# Patient Record
Sex: Female | Born: 1935 | Race: White | Hispanic: No | Smoking: Never smoker
Health system: Southern US, Community
[De-identification: ages and names within clinical notes are randomized; demographics above are authoritative.]

## PROBLEM LIST (undated history)

## (undated) DIAGNOSIS — Z86010 Personal history of colonic polyps: Secondary | ICD-10-CM

## (undated) DIAGNOSIS — E079 Disorder of thyroid, unspecified: Secondary | ICD-10-CM

## (undated) DIAGNOSIS — K5792 Diverticulitis of intestine, part unspecified, without perforation or abscess without bleeding: Secondary | ICD-10-CM

## (undated) DIAGNOSIS — I4891 Unspecified atrial fibrillation: Secondary | ICD-10-CM

## (undated) DIAGNOSIS — E785 Hyperlipidemia, unspecified: Secondary | ICD-10-CM

## (undated) DIAGNOSIS — N289 Disorder of kidney and ureter, unspecified: Secondary | ICD-10-CM

## (undated) DIAGNOSIS — A0472 Enterocolitis due to Clostridium difficile, not specified as recurrent: Secondary | ICD-10-CM

## (undated) DIAGNOSIS — K5790 Diverticulosis of intestine, part unspecified, without perforation or abscess without bleeding: Secondary | ICD-10-CM

## (undated) DIAGNOSIS — H269 Unspecified cataract: Secondary | ICD-10-CM

## (undated) DIAGNOSIS — I509 Heart failure, unspecified: Secondary | ICD-10-CM

## (undated) HISTORY — DX: Unspecified atrial fibrillation: I48.91

## (undated) HISTORY — DX: Unspecified cataract: H26.9

## (undated) HISTORY — DX: Disorder of kidney and ureter, unspecified: N28.9

## (undated) HISTORY — PX: CATARACT EXTRACTION, BILATERAL: SHX1313

## (undated) HISTORY — PX: CARDIAC DEFIBRILLATOR PLACEMENT: SHX171

## (undated) HISTORY — DX: Enterocolitis due to Clostridium difficile, not specified as recurrent: A04.72

## (undated) HISTORY — PX: PACEMAKER PLACEMENT: SHX43

## (undated) HISTORY — DX: Disorder of thyroid, unspecified: E07.9

## (undated) HISTORY — PX: OTHER SURGICAL HISTORY: SHX169

## (undated) HISTORY — DX: Diverticulitis of intestine, part unspecified, without perforation or abscess without bleeding: K57.92

## (undated) HISTORY — DX: Personal history of colonic polyps: Z86.010

## (undated) HISTORY — DX: Gilbert syndrome: E80.4

## (undated) HISTORY — DX: Hyperlipidemia, unspecified: E78.5

## (undated) HISTORY — PX: ROTATOR CUFF REPAIR: SHX139

## (undated) HISTORY — DX: Diverticulosis of intestine, part unspecified, without perforation or abscess without bleeding: K57.90

## (undated) HISTORY — PX: BIOPSY THYROID: PRO38

---
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
## 1970-02-17 HISTORY — PX: CHOLECYSTECTOMY: SHX55

## 1971-02-18 HISTORY — PX: ABDOMINAL HYSTERECTOMY: SHX81

## 1995-02-18 HISTORY — PX: BREAST BIOPSY: SHX20

## 1995-06-23 ENCOUNTER — Encounter: Payer: Self-pay | Admitting: Gastroenterology

## 1997-07-11 ENCOUNTER — Ambulatory Visit (HOSPITAL_COMMUNITY): Admission: RE | Admit: 1997-07-11 | Discharge: 1997-07-11 | Payer: Self-pay | Admitting: Surgery

## 1997-08-22 ENCOUNTER — Other Ambulatory Visit: Admission: RE | Admit: 1997-08-22 | Discharge: 1997-08-22 | Payer: Self-pay | Admitting: Gynecology

## 1997-08-30 ENCOUNTER — Ambulatory Visit (HOSPITAL_BASED_OUTPATIENT_CLINIC_OR_DEPARTMENT_OTHER): Admission: RE | Admit: 1997-08-30 | Discharge: 1997-08-30 | Payer: Self-pay | Admitting: Surgery

## 1998-01-10 ENCOUNTER — Ambulatory Visit: Admission: RE | Admit: 1998-01-10 | Discharge: 1998-01-10 | Payer: Self-pay | Admitting: Internal Medicine

## 1998-04-23 ENCOUNTER — Ambulatory Visit (HOSPITAL_BASED_OUTPATIENT_CLINIC_OR_DEPARTMENT_OTHER): Admission: RE | Admit: 1998-04-23 | Discharge: 1998-04-23 | Payer: Self-pay | Admitting: Orthopedic Surgery

## 1998-05-01 ENCOUNTER — Encounter: Admission: RE | Admit: 1998-05-01 | Discharge: 1998-07-30 | Payer: Self-pay | Admitting: Orthopedic Surgery

## 1998-10-18 ENCOUNTER — Other Ambulatory Visit: Admission: RE | Admit: 1998-10-18 | Discharge: 1998-10-18 | Payer: Self-pay | Admitting: Gynecology

## 1999-02-21 ENCOUNTER — Encounter: Payer: Self-pay | Admitting: Sports Medicine

## 1999-02-21 ENCOUNTER — Encounter: Admission: RE | Admit: 1999-02-21 | Discharge: 1999-02-21 | Payer: Self-pay | Admitting: Sports Medicine

## 1999-03-11 ENCOUNTER — Ambulatory Visit (HOSPITAL_BASED_OUTPATIENT_CLINIC_OR_DEPARTMENT_OTHER): Admission: RE | Admit: 1999-03-11 | Discharge: 1999-03-11 | Payer: Self-pay | Admitting: Orthopedic Surgery

## 1999-03-14 ENCOUNTER — Encounter: Admission: RE | Admit: 1999-03-14 | Discharge: 1999-06-12 | Payer: Self-pay | Admitting: Neurology

## 1999-09-10 ENCOUNTER — Encounter: Admission: RE | Admit: 1999-09-10 | Discharge: 1999-09-10 | Payer: Self-pay | Admitting: Gynecology

## 1999-09-10 ENCOUNTER — Encounter: Payer: Self-pay | Admitting: Gynecology

## 2000-06-22 ENCOUNTER — Ambulatory Visit (HOSPITAL_BASED_OUTPATIENT_CLINIC_OR_DEPARTMENT_OTHER): Admission: RE | Admit: 2000-06-22 | Discharge: 2000-06-22 | Payer: Self-pay | Admitting: Orthopedic Surgery

## 2000-08-30 ENCOUNTER — Inpatient Hospital Stay (HOSPITAL_COMMUNITY): Admission: EM | Admit: 2000-08-30 | Discharge: 2000-08-31 | Payer: Self-pay

## 2000-09-30 ENCOUNTER — Encounter: Payer: Self-pay | Admitting: Gynecology

## 2000-09-30 ENCOUNTER — Encounter: Admission: RE | Admit: 2000-09-30 | Discharge: 2000-09-30 | Payer: Self-pay | Admitting: Gynecology

## 2000-10-02 ENCOUNTER — Encounter: Payer: Self-pay | Admitting: Gastroenterology

## 2000-10-09 ENCOUNTER — Ambulatory Visit (HOSPITAL_COMMUNITY): Admission: RE | Admit: 2000-10-09 | Discharge: 2000-10-09 | Payer: Self-pay | Admitting: Internal Medicine

## 2000-10-09 ENCOUNTER — Encounter: Payer: Self-pay | Admitting: Internal Medicine

## 2000-10-21 ENCOUNTER — Other Ambulatory Visit: Admission: RE | Admit: 2000-10-21 | Discharge: 2000-10-21 | Payer: Self-pay | Admitting: Gynecology

## 2001-04-20 ENCOUNTER — Other Ambulatory Visit: Admission: RE | Admit: 2001-04-20 | Discharge: 2001-04-20 | Payer: Self-pay | Admitting: *Deleted

## 2001-04-24 ENCOUNTER — Emergency Department (HOSPITAL_COMMUNITY): Admission: EM | Admit: 2001-04-24 | Discharge: 2001-04-24 | Payer: Self-pay | Admitting: *Deleted

## 2001-10-01 ENCOUNTER — Encounter: Admission: RE | Admit: 2001-10-01 | Discharge: 2001-10-01 | Payer: Self-pay | Admitting: Obstetrics and Gynecology

## 2001-10-01 ENCOUNTER — Encounter: Payer: Self-pay | Admitting: Obstetrics and Gynecology

## 2001-10-06 ENCOUNTER — Other Ambulatory Visit: Admission: RE | Admit: 2001-10-06 | Discharge: 2001-10-06 | Payer: Self-pay | Admitting: Obstetrics and Gynecology

## 2002-10-25 ENCOUNTER — Other Ambulatory Visit: Admission: RE | Admit: 2002-10-25 | Discharge: 2002-10-25 | Payer: Self-pay | Admitting: Obstetrics and Gynecology

## 2002-12-18 ENCOUNTER — Emergency Department (HOSPITAL_COMMUNITY): Admission: EM | Admit: 2002-12-18 | Discharge: 2002-12-18 | Payer: Self-pay | Admitting: Emergency Medicine

## 2003-09-26 ENCOUNTER — Encounter: Admission: RE | Admit: 2003-09-26 | Discharge: 2003-09-26 | Payer: Self-pay | Admitting: Sports Medicine

## 2003-10-12 ENCOUNTER — Encounter: Payer: Self-pay | Admitting: Internal Medicine

## 2003-10-15 ENCOUNTER — Inpatient Hospital Stay (HOSPITAL_COMMUNITY): Admission: EM | Admit: 2003-10-15 | Discharge: 2003-10-16 | Payer: Self-pay | Admitting: Emergency Medicine

## 2003-10-16 ENCOUNTER — Encounter: Payer: Self-pay | Admitting: Cardiology

## 2003-11-13 ENCOUNTER — Other Ambulatory Visit: Admission: RE | Admit: 2003-11-13 | Discharge: 2003-11-13 | Payer: Self-pay | Admitting: Obstetrics and Gynecology

## 2003-12-28 ENCOUNTER — Ambulatory Visit: Payer: Self-pay | Admitting: Cardiology

## 2004-01-09 ENCOUNTER — Ambulatory Visit: Payer: Self-pay | Admitting: Internal Medicine

## 2004-01-23 ENCOUNTER — Ambulatory Visit: Payer: Self-pay | Admitting: Cardiology

## 2004-02-15 ENCOUNTER — Ambulatory Visit: Payer: Self-pay | Admitting: *Deleted

## 2004-02-29 ENCOUNTER — Ambulatory Visit: Payer: Self-pay | Admitting: Internal Medicine

## 2004-03-14 ENCOUNTER — Ambulatory Visit: Payer: Self-pay | Admitting: Cardiology

## 2004-04-01 ENCOUNTER — Ambulatory Visit: Payer: Self-pay | Admitting: Cardiology

## 2004-04-09 ENCOUNTER — Ambulatory Visit: Payer: Self-pay | Admitting: Internal Medicine

## 2004-04-22 ENCOUNTER — Ambulatory Visit: Payer: Self-pay | Admitting: Cardiology

## 2004-05-20 ENCOUNTER — Ambulatory Visit: Payer: Self-pay | Admitting: Cardiology

## 2004-06-17 ENCOUNTER — Ambulatory Visit: Payer: Self-pay | Admitting: Cardiology

## 2004-06-27 ENCOUNTER — Ambulatory Visit: Payer: Self-pay | Admitting: Internal Medicine

## 2004-07-18 ENCOUNTER — Ambulatory Visit: Payer: Self-pay | Admitting: Internal Medicine

## 2004-08-15 ENCOUNTER — Ambulatory Visit: Payer: Self-pay | Admitting: Internal Medicine

## 2004-08-27 ENCOUNTER — Ambulatory Visit: Payer: Self-pay | Admitting: *Deleted

## 2004-10-11 ENCOUNTER — Ambulatory Visit: Payer: Self-pay | Admitting: Internal Medicine

## 2004-10-15 ENCOUNTER — Ambulatory Visit: Payer: Self-pay | Admitting: Internal Medicine

## 2004-10-16 ENCOUNTER — Other Ambulatory Visit: Admission: RE | Admit: 2004-10-16 | Discharge: 2004-10-16 | Payer: Self-pay | Admitting: Gynecology

## 2004-11-07 ENCOUNTER — Ambulatory Visit: Payer: Self-pay | Admitting: Cardiology

## 2004-11-20 ENCOUNTER — Ambulatory Visit: Payer: Self-pay | Admitting: Cardiology

## 2004-11-28 ENCOUNTER — Ambulatory Visit: Payer: Self-pay | Admitting: Internal Medicine

## 2004-12-11 ENCOUNTER — Ambulatory Visit: Payer: Self-pay | Admitting: Cardiology

## 2004-12-19 ENCOUNTER — Ambulatory Visit: Payer: Self-pay | Admitting: Cardiology

## 2005-01-02 ENCOUNTER — Ambulatory Visit: Payer: Self-pay | Admitting: Internal Medicine

## 2005-01-24 ENCOUNTER — Ambulatory Visit: Payer: Self-pay | Admitting: Cardiology

## 2005-01-24 ENCOUNTER — Ambulatory Visit: Payer: Self-pay | Admitting: Internal Medicine

## 2005-02-21 ENCOUNTER — Ambulatory Visit: Payer: Self-pay | Admitting: Cardiology

## 2005-03-21 ENCOUNTER — Ambulatory Visit: Payer: Self-pay | Admitting: Cardiology

## 2005-03-25 ENCOUNTER — Ambulatory Visit: Payer: Self-pay | Admitting: Internal Medicine

## 2005-04-04 ENCOUNTER — Ambulatory Visit: Payer: Self-pay | Admitting: Cardiovascular Disease

## 2005-04-24 ENCOUNTER — Ambulatory Visit: Payer: Self-pay | Admitting: Cardiology

## 2005-05-22 ENCOUNTER — Ambulatory Visit: Payer: Self-pay | Admitting: Cardiology

## 2005-06-19 ENCOUNTER — Ambulatory Visit: Payer: Self-pay | Admitting: Cardiology

## 2005-07-17 ENCOUNTER — Ambulatory Visit: Payer: Self-pay | Admitting: Cardiology

## 2005-08-07 ENCOUNTER — Ambulatory Visit: Payer: Self-pay | Admitting: Cardiology

## 2005-08-27 ENCOUNTER — Ambulatory Visit: Payer: Self-pay | Admitting: Cardiovascular Disease

## 2005-09-17 ENCOUNTER — Ambulatory Visit: Payer: Self-pay | Admitting: Internal Medicine

## 2005-09-23 ENCOUNTER — Ambulatory Visit: Payer: Self-pay

## 2005-09-23 ENCOUNTER — Ambulatory Visit: Payer: Self-pay | Admitting: Cardiology

## 2005-09-23 ENCOUNTER — Encounter: Payer: Self-pay | Admitting: Cardiology

## 2005-09-25 ENCOUNTER — Ambulatory Visit: Payer: Self-pay | Admitting: Internal Medicine

## 2005-10-06 ENCOUNTER — Ambulatory Visit: Payer: Self-pay | Admitting: Internal Medicine

## 2005-10-08 ENCOUNTER — Encounter: Admission: RE | Admit: 2005-10-08 | Discharge: 2005-10-08 | Payer: Self-pay | Admitting: Gynecology

## 2005-10-09 ENCOUNTER — Ambulatory Visit: Payer: Self-pay | Admitting: Internal Medicine

## 2005-10-14 ENCOUNTER — Encounter: Admission: RE | Admit: 2005-10-14 | Discharge: 2005-10-14 | Payer: Self-pay | Admitting: Gynecology

## 2005-10-16 ENCOUNTER — Ambulatory Visit: Payer: Self-pay | Admitting: Internal Medicine

## 2005-10-22 ENCOUNTER — Ambulatory Visit: Payer: Self-pay | Admitting: *Deleted

## 2005-11-11 ENCOUNTER — Ambulatory Visit: Payer: Self-pay | Admitting: Cardiovascular Disease

## 2005-12-04 ENCOUNTER — Ambulatory Visit: Payer: Self-pay | Admitting: Internal Medicine

## 2005-12-11 ENCOUNTER — Ambulatory Visit: Payer: Self-pay | Admitting: Cardiology

## 2005-12-16 ENCOUNTER — Ambulatory Visit: Payer: Self-pay | Admitting: Internal Medicine

## 2005-12-16 LAB — CONVERTED CEMR LAB
Chol/HDL Ratio, serum: 3.7
Cholesterol: 130 mg/dL (ref 0–200)
HDL: 35.1 mg/dL — ABNORMAL LOW (ref >39.0)
LDL Cholesterol: 75 mg/dL (ref 0–99)
Triglyceride fasting, serum: 100 mg/dL (ref 0–149)

## 2005-12-24 ENCOUNTER — Ambulatory Visit: Payer: Self-pay | Admitting: Internal Medicine

## 2006-01-01 ENCOUNTER — Ambulatory Visit: Payer: Self-pay | Admitting: Internal Medicine

## 2006-01-01 ENCOUNTER — Ambulatory Visit: Payer: Self-pay | Admitting: Cardiology

## 2006-01-15 ENCOUNTER — Ambulatory Visit: Payer: Self-pay | Admitting: Internal Medicine

## 2006-02-04 ENCOUNTER — Ambulatory Visit: Payer: Self-pay | Admitting: *Deleted

## 2006-03-04 ENCOUNTER — Ambulatory Visit: Payer: Self-pay | Admitting: Cardiology

## 2006-03-12 ENCOUNTER — Ambulatory Visit: Payer: Self-pay | Admitting: Internal Medicine

## 2006-04-01 ENCOUNTER — Ambulatory Visit: Payer: Self-pay | Admitting: *Deleted

## 2006-04-22 ENCOUNTER — Ambulatory Visit: Payer: Self-pay | Admitting: *Deleted

## 2006-05-05 ENCOUNTER — Ambulatory Visit: Payer: Self-pay | Admitting: Internal Medicine

## 2006-05-20 ENCOUNTER — Ambulatory Visit: Payer: Self-pay | Admitting: Cardiology

## 2006-06-17 ENCOUNTER — Ambulatory Visit: Payer: Self-pay | Admitting: Internal Medicine

## 2006-07-01 ENCOUNTER — Ambulatory Visit: Payer: Self-pay | Admitting: Internal Medicine

## 2006-07-01 LAB — CONVERTED CEMR LAB
HDL: 35.4 mg/dL — ABNORMAL LOW (ref >39.0)
LDL Cholesterol: 68 mg/dL (ref 0–99)
Total CHOL/HDL Ratio: 3.6
Triglycerides: 129 mg/dL (ref 0–149)
VLDL: 26 mg/dL (ref 0–40)

## 2006-07-09 DIAGNOSIS — E041 Nontoxic single thyroid nodule: Secondary | ICD-10-CM | POA: Insufficient documentation

## 2006-07-09 DIAGNOSIS — E890 Postprocedural hypothyroidism: Secondary | ICD-10-CM | POA: Insufficient documentation

## 2006-07-14 ENCOUNTER — Encounter: Payer: Self-pay | Admitting: Internal Medicine

## 2006-07-14 ENCOUNTER — Ambulatory Visit: Payer: Self-pay | Admitting: Internal Medicine

## 2006-07-14 DIAGNOSIS — I4891 Unspecified atrial fibrillation: Secondary | ICD-10-CM | POA: Insufficient documentation

## 2006-07-16 ENCOUNTER — Ambulatory Visit: Payer: Self-pay | Admitting: Internal Medicine

## 2006-07-16 ENCOUNTER — Ambulatory Visit: Payer: Self-pay | Admitting: *Deleted

## 2006-07-17 ENCOUNTER — Encounter: Admission: RE | Admit: 2006-07-17 | Discharge: 2006-07-17 | Payer: Self-pay | Admitting: Internal Medicine

## 2006-08-04 ENCOUNTER — Encounter: Payer: Self-pay | Admitting: Internal Medicine

## 2006-08-04 ENCOUNTER — Ambulatory Visit: Payer: Self-pay

## 2006-08-13 ENCOUNTER — Ambulatory Visit: Payer: Self-pay | Admitting: Cardiology

## 2006-08-28 ENCOUNTER — Telehealth (INDEPENDENT_AMBULATORY_CARE_PROVIDER_SITE_OTHER): Payer: Self-pay | Admitting: *Deleted

## 2006-09-07 ENCOUNTER — Telehealth (INDEPENDENT_AMBULATORY_CARE_PROVIDER_SITE_OTHER): Payer: Self-pay | Admitting: *Deleted

## 2006-09-08 ENCOUNTER — Ambulatory Visit: Payer: Self-pay | Admitting: Internal Medicine

## 2006-09-08 DIAGNOSIS — H16229 Keratoconjunctivitis sicca, not specified as Sjogren's, unspecified eye: Secondary | ICD-10-CM | POA: Insufficient documentation

## 2006-09-10 ENCOUNTER — Ambulatory Visit: Payer: Self-pay | Admitting: Cardiology

## 2006-09-29 ENCOUNTER — Ambulatory Visit: Payer: Self-pay | Admitting: Cardiology

## 2006-09-29 ENCOUNTER — Ambulatory Visit: Payer: Self-pay | Admitting: Internal Medicine

## 2006-09-29 LAB — CONVERTED CEMR LAB
Basophils Relative: 0 % (ref 0.0–1.0)
CO2: 27 meq/L (ref 19–32)
Chloride: 104 meq/L (ref 96–112)
Eosinophils Absolute: 0.1 10*3/uL (ref 0.0–0.6)
Eosinophils Relative: 1.7 % (ref 0.0–5.0)
GFR calc Af Amer: 70 mL/min
GFR calc non Af Amer: 57 mL/min
Glucose, Bld: 93 mg/dL (ref 70–99)
HCT: 42.3 % (ref 36.0–46.0)
Lymphocytes Relative: 33.9 % (ref 12.0–46.0)
MCV: 90.5 fL (ref 78.0–100.0)
Neutro Abs: 4.6 10*3/uL (ref 1.4–7.7)
Neutrophils Relative %: 58.1 % (ref 43.0–77.0)
Prothrombin Time: 20.2 s — ABNORMAL HIGH (ref 10.0–14.0)
Sodium: 138 meq/L (ref 135–145)
WBC: 7.9 10*3/uL (ref 4.5–10.5)

## 2006-09-30 ENCOUNTER — Ambulatory Visit: Payer: Self-pay | Admitting: Internal Medicine

## 2006-10-06 ENCOUNTER — Encounter: Payer: Self-pay | Admitting: Internal Medicine

## 2006-10-07 ENCOUNTER — Ambulatory Visit: Payer: Self-pay | Admitting: Cardiology

## 2006-10-07 ENCOUNTER — Ambulatory Visit (HOSPITAL_COMMUNITY): Admission: RE | Admit: 2006-10-07 | Discharge: 2006-10-07 | Payer: Self-pay | Admitting: Internal Medicine

## 2006-10-14 ENCOUNTER — Encounter: Admission: RE | Admit: 2006-10-14 | Discharge: 2006-10-14 | Payer: Self-pay | Admitting: Gynecology

## 2006-10-21 ENCOUNTER — Ambulatory Visit: Payer: Self-pay | Admitting: Internal Medicine

## 2006-10-21 LAB — CONVERTED CEMR LAB
BUN: 17 mg/dL (ref 6–23)
Basophils Relative: 0 % (ref 0.0–1.0)
CO2: 31 meq/L (ref 19–32)
Creatinine, Ser: 1 mg/dL (ref 0.4–1.2)
GFR calc Af Amer: 70 mL/min
Glucose, Bld: 95 mg/dL (ref 70–99)
HCT: 41.2 % (ref 36.0–46.0)
Hemoglobin: 14.5 g/dL (ref 12.0–15.0)
INR: 2.7 — ABNORMAL HIGH (ref 0.8–1.0)
Lymphocytes Relative: 34.1 % (ref 12.0–46.0)
Monocytes Absolute: 0.5 10*3/uL (ref 0.2–0.7)
Monocytes Relative: 7.3 % (ref 3.0–11.0)
Neutro Abs: 4.3 10*3/uL (ref 1.4–7.7)
Neutrophils Relative %: 56.8 % (ref 43.0–77.0)
Potassium: 4.1 meq/L (ref 3.5–5.1)
Prothrombin Time: 20.8 s — ABNORMAL HIGH (ref 10.9–13.3)
RDW: 13.4 % (ref 11.5–14.6)
Sodium: 142 meq/L (ref 135–145)

## 2006-10-22 ENCOUNTER — Ambulatory Visit: Payer: Self-pay | Admitting: Cardiology

## 2006-10-24 ENCOUNTER — Emergency Department (HOSPITAL_COMMUNITY): Admission: EM | Admit: 2006-10-24 | Discharge: 2006-10-24 | Payer: Self-pay | Admitting: Emergency Medicine

## 2006-10-25 ENCOUNTER — Encounter: Payer: Self-pay | Admitting: Internal Medicine

## 2006-10-26 ENCOUNTER — Ambulatory Visit (HOSPITAL_COMMUNITY): Admission: RE | Admit: 2006-10-26 | Discharge: 2006-10-27 | Payer: Self-pay | Admitting: Internal Medicine

## 2006-10-26 ENCOUNTER — Ambulatory Visit: Payer: Self-pay | Admitting: Internal Medicine

## 2006-10-30 ENCOUNTER — Ambulatory Visit: Payer: Self-pay | Admitting: Cardiology

## 2006-10-30 ENCOUNTER — Ambulatory Visit: Payer: Self-pay | Admitting: Internal Medicine

## 2006-11-02 ENCOUNTER — Encounter: Admission: RE | Admit: 2006-11-02 | Discharge: 2006-11-02 | Payer: Self-pay | Admitting: Gynecology

## 2006-11-02 ENCOUNTER — Encounter: Payer: Self-pay | Admitting: Internal Medicine

## 2006-11-03 ENCOUNTER — Telehealth (INDEPENDENT_AMBULATORY_CARE_PROVIDER_SITE_OTHER): Payer: Self-pay | Admitting: *Deleted

## 2006-11-04 ENCOUNTER — Telehealth: Payer: Self-pay | Admitting: Internal Medicine

## 2006-11-04 ENCOUNTER — Ambulatory Visit: Payer: Self-pay | Admitting: Internal Medicine

## 2006-11-05 ENCOUNTER — Ambulatory Visit: Payer: Self-pay | Admitting: Cardiology

## 2006-11-11 ENCOUNTER — Ambulatory Visit: Payer: Self-pay | Admitting: Internal Medicine

## 2006-11-11 ENCOUNTER — Ambulatory Visit: Payer: Self-pay

## 2006-11-13 ENCOUNTER — Encounter: Admission: RE | Admit: 2006-11-13 | Discharge: 2006-11-13 | Payer: Self-pay | Admitting: Internal Medicine

## 2006-11-13 ENCOUNTER — Telehealth: Payer: Self-pay | Admitting: Internal Medicine

## 2006-11-20 ENCOUNTER — Ambulatory Visit: Payer: Self-pay | Admitting: Internal Medicine

## 2006-11-26 ENCOUNTER — Telehealth (INDEPENDENT_AMBULATORY_CARE_PROVIDER_SITE_OTHER): Payer: Self-pay | Admitting: *Deleted

## 2006-11-27 ENCOUNTER — Ambulatory Visit: Payer: Self-pay | Admitting: Internal Medicine

## 2006-11-27 DIAGNOSIS — R109 Unspecified abdominal pain: Secondary | ICD-10-CM | POA: Insufficient documentation

## 2006-11-27 DIAGNOSIS — R197 Diarrhea, unspecified: Secondary | ICD-10-CM | POA: Insufficient documentation

## 2006-11-27 DIAGNOSIS — T887XXA Unspecified adverse effect of drug or medicament, initial encounter: Secondary | ICD-10-CM | POA: Insufficient documentation

## 2006-11-27 DIAGNOSIS — F411 Generalized anxiety disorder: Secondary | ICD-10-CM | POA: Insufficient documentation

## 2006-11-29 LAB — CONVERTED CEMR LAB
BUN: 19 mg/dL (ref 6–23)
Basophils Relative: 0 % (ref 0–1)
Creatinine, Ser: 0.98 mg/dL (ref 0.40–1.20)
Eosinophils Absolute: 0.1 10*3/uL (ref 0.0–0.7)
Eosinophils Relative: 1 % (ref 0–5)
HCT: 40.4 % (ref 36.0–46.0)
Lymphs Abs: 2 10*3/uL (ref 0.7–3.3)
MCHC: 34.7 g/dL (ref 30.0–36.0)
MCV: 89.8 fL (ref 78.0–100.0)
Neutrophils Relative %: 69 % (ref 43–77)
Platelets: 176 10*3/uL (ref 150–400)
RDW: 14.7 % — ABNORMAL HIGH (ref 11.5–14.0)
WBC: 9.4 10*3/uL (ref 4.0–10.5)

## 2006-11-30 ENCOUNTER — Ambulatory Visit: Payer: Self-pay | Admitting: Cardiology

## 2006-11-30 ENCOUNTER — Encounter: Payer: Self-pay | Admitting: Internal Medicine

## 2006-11-30 ENCOUNTER — Encounter (INDEPENDENT_AMBULATORY_CARE_PROVIDER_SITE_OTHER): Payer: Self-pay | Admitting: *Deleted

## 2006-12-01 ENCOUNTER — Telehealth: Payer: Self-pay | Admitting: Internal Medicine

## 2006-12-02 ENCOUNTER — Ambulatory Visit: Payer: Self-pay | Admitting: Internal Medicine

## 2006-12-02 ENCOUNTER — Encounter (INDEPENDENT_AMBULATORY_CARE_PROVIDER_SITE_OTHER): Payer: Self-pay | Admitting: *Deleted

## 2006-12-03 ENCOUNTER — Ambulatory Visit: Payer: Self-pay | Admitting: Gastroenterology

## 2006-12-04 ENCOUNTER — Encounter (INDEPENDENT_AMBULATORY_CARE_PROVIDER_SITE_OTHER): Payer: Self-pay | Admitting: *Deleted

## 2006-12-09 ENCOUNTER — Ambulatory Visit: Payer: Self-pay | Admitting: Internal Medicine

## 2006-12-10 ENCOUNTER — Ambulatory Visit: Payer: Self-pay | Admitting: Internal Medicine

## 2006-12-14 ENCOUNTER — Ambulatory Visit: Payer: Self-pay | Admitting: Cardiology

## 2006-12-15 ENCOUNTER — Telehealth (INDEPENDENT_AMBULATORY_CARE_PROVIDER_SITE_OTHER): Payer: Self-pay | Admitting: *Deleted

## 2006-12-16 ENCOUNTER — Ambulatory Visit: Payer: Self-pay | Admitting: Gastroenterology

## 2006-12-16 ENCOUNTER — Telehealth (INDEPENDENT_AMBULATORY_CARE_PROVIDER_SITE_OTHER): Payer: Self-pay | Admitting: *Deleted

## 2006-12-17 ENCOUNTER — Ambulatory Visit: Payer: Self-pay | Admitting: Internal Medicine

## 2006-12-22 ENCOUNTER — Ambulatory Visit: Payer: Self-pay | Admitting: Internal Medicine

## 2006-12-22 DIAGNOSIS — E785 Hyperlipidemia, unspecified: Secondary | ICD-10-CM | POA: Insufficient documentation

## 2006-12-23 ENCOUNTER — Ambulatory Visit: Payer: Self-pay | Admitting: Internal Medicine

## 2006-12-25 ENCOUNTER — Encounter (INDEPENDENT_AMBULATORY_CARE_PROVIDER_SITE_OTHER): Payer: Self-pay | Admitting: *Deleted

## 2006-12-25 LAB — CONVERTED CEMR LAB: Sed Rate: 18 mm/hr (ref 0–25)

## 2006-12-28 ENCOUNTER — Ambulatory Visit: Payer: Self-pay | Admitting: Internal Medicine

## 2007-01-07 ENCOUNTER — Ambulatory Visit: Payer: Self-pay | Admitting: Cardiology

## 2007-01-07 ENCOUNTER — Ambulatory Visit: Payer: Self-pay

## 2007-01-07 ENCOUNTER — Ambulatory Visit: Payer: Self-pay | Admitting: Internal Medicine

## 2007-01-07 ENCOUNTER — Encounter: Payer: Self-pay | Admitting: Internal Medicine

## 2007-01-21 ENCOUNTER — Ambulatory Visit: Payer: Self-pay | Admitting: Cardiovascular Disease

## 2007-01-25 ENCOUNTER — Ambulatory Visit: Payer: Self-pay

## 2007-01-27 ENCOUNTER — Encounter (INDEPENDENT_AMBULATORY_CARE_PROVIDER_SITE_OTHER): Payer: Self-pay | Admitting: Surgery

## 2007-01-27 ENCOUNTER — Observation Stay (HOSPITAL_COMMUNITY): Admission: RE | Admit: 2007-01-27 | Discharge: 2007-01-28 | Payer: Self-pay | Admitting: Surgery

## 2007-02-03 ENCOUNTER — Ambulatory Visit: Payer: Self-pay | Admitting: Cardiovascular Disease

## 2007-02-15 ENCOUNTER — Ambulatory Visit: Payer: Self-pay | Admitting: Cardiology

## 2007-02-24 ENCOUNTER — Encounter: Payer: Self-pay | Admitting: Internal Medicine

## 2007-03-01 ENCOUNTER — Ambulatory Visit: Payer: Self-pay | Admitting: Cardiology

## 2007-03-01 ENCOUNTER — Ambulatory Visit: Payer: Self-pay | Admitting: Internal Medicine

## 2007-03-15 ENCOUNTER — Ambulatory Visit: Payer: Self-pay | Admitting: Cardiology

## 2007-03-31 ENCOUNTER — Ambulatory Visit: Payer: Self-pay | Admitting: Cardiology

## 2007-04-01 ENCOUNTER — Encounter: Payer: Self-pay | Admitting: Internal Medicine

## 2007-04-14 ENCOUNTER — Ambulatory Visit: Payer: Self-pay | Admitting: Internal Medicine

## 2007-04-14 DIAGNOSIS — Z8719 Personal history of other diseases of the digestive system: Secondary | ICD-10-CM | POA: Insufficient documentation

## 2007-05-05 ENCOUNTER — Ambulatory Visit: Payer: Self-pay | Admitting: Cardiology

## 2007-05-05 ENCOUNTER — Encounter: Payer: Self-pay | Admitting: Family Medicine

## 2007-05-05 LAB — CONVERTED CEMR LAB
Albumin: 3.8 g/dL (ref 3.5–5.2)
Direct LDL: 143.8 mg/dL
HDL: 35.1 mg/dL — ABNORMAL LOW (ref >39.0)
TSH: 2.38 microintl units/mL (ref 0.35–5.50)
Total Bilirubin: 1.1 mg/dL (ref 0.3–1.2)
Total CHOL/HDL Ratio: 6.7
Total Protein: 7.3 g/dL (ref 6.0–8.3)
Triglycerides: 249 mg/dL (ref 0–149)

## 2007-05-10 ENCOUNTER — Ambulatory Visit: Payer: Self-pay | Admitting: Internal Medicine

## 2007-05-10 LAB — CONVERTED CEMR LAB
Cholesterol, target level: 200 mg/dL
HDL goal, serum: 40 mg/dL

## 2007-05-14 ENCOUNTER — Encounter: Payer: Self-pay | Admitting: Internal Medicine

## 2007-05-21 ENCOUNTER — Ambulatory Visit: Payer: Self-pay | Admitting: Internal Medicine

## 2007-05-21 ENCOUNTER — Ambulatory Visit: Payer: Self-pay | Admitting: Cardiology

## 2007-06-01 ENCOUNTER — Encounter: Payer: Self-pay | Admitting: Internal Medicine

## 2007-06-07 ENCOUNTER — Ambulatory Visit: Payer: Self-pay | Admitting: Cardiology

## 2007-06-09 ENCOUNTER — Ambulatory Visit: Payer: Self-pay | Admitting: Internal Medicine

## 2007-06-14 ENCOUNTER — Ambulatory Visit: Payer: Self-pay | Admitting: Cardiology

## 2007-06-14 LAB — CONVERTED CEMR LAB
CO2: 29 meq/L (ref 19–32)
Calcium: 8.8 mg/dL (ref 8.4–10.5)
Creatinine, Ser: 1 mg/dL (ref 0.4–1.2)
GFR calc Af Amer: 70 mL/min
Glucose, Bld: 84 mg/dL (ref 70–99)

## 2007-06-18 ENCOUNTER — Ambulatory Visit: Payer: Self-pay | Admitting: Internal Medicine

## 2007-07-27 ENCOUNTER — Ambulatory Visit: Payer: Self-pay | Admitting: Internal Medicine

## 2007-08-24 ENCOUNTER — Ambulatory Visit: Payer: Self-pay | Admitting: Cardiovascular Disease

## 2007-08-27 ENCOUNTER — Ambulatory Visit: Payer: Self-pay | Admitting: Internal Medicine

## 2007-08-27 LAB — CONVERTED CEMR LAB
AST: 25 units/L (ref 0–37)
Albumin: 3.5 g/dL (ref 3.5–5.2)
HDL: 35.6 mg/dL — ABNORMAL LOW (ref >39.0)
Hgb A1c MFr Bld: 5.6 % (ref 4.6–6.0)
TSH: 0.32 microintl units/mL — ABNORMAL LOW (ref 0.35–5.50)
Total Bilirubin: 1.3 mg/dL — ABNORMAL HIGH (ref 0.3–1.2)
Total CHOL/HDL Ratio: 5.3
Triglycerides: 134 mg/dL (ref 0–149)
VLDL: 27 mg/dL (ref 0–40)

## 2007-08-31 ENCOUNTER — Ambulatory Visit: Payer: Self-pay | Admitting: Internal Medicine

## 2007-08-31 DIAGNOSIS — R5381 Other malaise: Secondary | ICD-10-CM | POA: Insufficient documentation

## 2007-08-31 DIAGNOSIS — R5383 Other fatigue: Secondary | ICD-10-CM

## 2007-09-21 ENCOUNTER — Ambulatory Visit: Payer: Self-pay | Admitting: Cardiology

## 2007-10-19 ENCOUNTER — Ambulatory Visit: Payer: Self-pay | Admitting: Cardiology

## 2007-11-16 ENCOUNTER — Ambulatory Visit: Payer: Self-pay | Admitting: Cardiology

## 2007-12-01 ENCOUNTER — Ambulatory Visit: Payer: Self-pay | Admitting: Internal Medicine

## 2007-12-06 ENCOUNTER — Telehealth: Payer: Self-pay | Admitting: Internal Medicine

## 2007-12-07 ENCOUNTER — Ambulatory Visit: Payer: Self-pay | Admitting: Internal Medicine

## 2007-12-07 DIAGNOSIS — K5732 Diverticulitis of large intestine without perforation or abscess without bleeding: Secondary | ICD-10-CM | POA: Insufficient documentation

## 2007-12-07 DIAGNOSIS — R58 Hemorrhage, not elsewhere classified: Secondary | ICD-10-CM | POA: Insufficient documentation

## 2007-12-07 DIAGNOSIS — R609 Edema, unspecified: Secondary | ICD-10-CM | POA: Insufficient documentation

## 2007-12-08 ENCOUNTER — Ambulatory Visit: Payer: Self-pay | Admitting: Internal Medicine

## 2007-12-09 ENCOUNTER — Telehealth (INDEPENDENT_AMBULATORY_CARE_PROVIDER_SITE_OTHER): Payer: Self-pay | Admitting: *Deleted

## 2007-12-14 ENCOUNTER — Ambulatory Visit: Payer: Self-pay | Admitting: Cardiovascular Disease

## 2007-12-21 ENCOUNTER — Ambulatory Visit: Payer: Self-pay | Admitting: Internal Medicine

## 2007-12-21 ENCOUNTER — Ambulatory Visit: Payer: Self-pay

## 2007-12-21 ENCOUNTER — Encounter: Payer: Self-pay | Admitting: Internal Medicine

## 2007-12-31 ENCOUNTER — Ambulatory Visit: Payer: Self-pay | Admitting: Internal Medicine

## 2008-01-18 ENCOUNTER — Ambulatory Visit: Payer: Self-pay | Admitting: Cardiovascular Disease

## 2008-02-08 ENCOUNTER — Ambulatory Visit: Payer: Self-pay | Admitting: Internal Medicine

## 2008-02-08 LAB — CONVERTED CEMR LAB
ALT: 24 units/L (ref 0–35)
AST: 26 units/L (ref 0–37)
Albumin: 3.6 g/dL (ref 3.5–5.2)
HDL: 37.8 mg/dL — ABNORMAL LOW (ref >39.0)
TSH: 0.66 microintl units/mL (ref 0.35–5.50)
Total Bilirubin: 1.4 mg/dL — ABNORMAL HIGH (ref 0.3–1.2)
Total CHOL/HDL Ratio: 4.2
Triglycerides: 147 mg/dL (ref 0–149)
VLDL: 29 mg/dL (ref 0–40)

## 2008-02-15 ENCOUNTER — Ambulatory Visit: Payer: Self-pay | Admitting: Internal Medicine

## 2008-02-22 ENCOUNTER — Ambulatory Visit: Payer: Self-pay | Admitting: Internal Medicine

## 2008-02-29 ENCOUNTER — Ambulatory Visit: Payer: Self-pay

## 2008-02-29 ENCOUNTER — Ambulatory Visit: Payer: Self-pay | Admitting: Internal Medicine

## 2008-02-29 ENCOUNTER — Encounter: Payer: Self-pay | Admitting: Internal Medicine

## 2008-02-29 LAB — CONVERTED CEMR LAB
BUN: 17 mg/dL (ref 6–23)
Basophils Relative: 1.3 % (ref 0.0–3.0)
Calcium: 9.2 mg/dL (ref 8.4–10.5)
Eosinophils Relative: 1.3 % (ref 0.0–5.0)
GFR calc Af Amer: 78 mL/min
Glucose, Bld: 95 mg/dL (ref 70–99)
Lymphocytes Relative: 31.9 % (ref 12.0–46.0)
MCHC: 33.7 g/dL (ref 30.0–36.0)
MCV: 91.6 fL (ref 78.0–100.0)
Monocytes Absolute: 0.4 10*3/uL (ref 0.1–1.0)
Monocytes Relative: 6.6 % (ref 3.0–12.0)
Neutro Abs: 4 10*3/uL (ref 1.4–7.7)
Neutrophils Relative %: 58.9 % (ref 43.0–77.0)
Platelets: 196 10*3/uL (ref 150–400)
Prothrombin Time: 24.6 s — ABNORMAL HIGH (ref 10.9–13.3)
RDW: 13.7 % (ref 11.5–14.6)

## 2008-03-09 ENCOUNTER — Inpatient Hospital Stay (HOSPITAL_COMMUNITY): Admission: RE | Admit: 2008-03-09 | Discharge: 2008-03-10 | Payer: Self-pay | Admitting: Internal Medicine

## 2008-03-09 ENCOUNTER — Ambulatory Visit: Payer: Self-pay | Admitting: Internal Medicine

## 2008-03-15 ENCOUNTER — Ambulatory Visit: Payer: Self-pay | Admitting: Cardiology

## 2008-03-22 ENCOUNTER — Encounter (INDEPENDENT_AMBULATORY_CARE_PROVIDER_SITE_OTHER): Payer: Self-pay | Admitting: *Deleted

## 2008-03-22 ENCOUNTER — Ambulatory Visit: Payer: Self-pay

## 2008-03-22 ENCOUNTER — Ambulatory Visit: Payer: Self-pay | Admitting: Cardiovascular Disease

## 2008-04-19 ENCOUNTER — Encounter: Payer: Self-pay | Admitting: Internal Medicine

## 2008-04-19 ENCOUNTER — Ambulatory Visit: Payer: Self-pay | Admitting: Internal Medicine

## 2008-04-19 DIAGNOSIS — Z95810 Presence of automatic (implantable) cardiac defibrillator: Secondary | ICD-10-CM | POA: Insufficient documentation

## 2008-04-20 ENCOUNTER — Ambulatory Visit: Payer: Self-pay | Admitting: Internal Medicine

## 2008-04-24 ENCOUNTER — Ambulatory Visit: Payer: Self-pay

## 2008-05-08 ENCOUNTER — Ambulatory Visit: Payer: Self-pay | Admitting: Internal Medicine

## 2008-05-11 ENCOUNTER — Encounter (INDEPENDENT_AMBULATORY_CARE_PROVIDER_SITE_OTHER): Payer: Self-pay | Admitting: *Deleted

## 2008-05-11 LAB — CONVERTED CEMR LAB: TSH: 1.07 microintl units/mL (ref 0.35–5.50)

## 2008-05-17 ENCOUNTER — Ambulatory Visit: Payer: Self-pay

## 2008-05-17 ENCOUNTER — Ambulatory Visit: Payer: Self-pay | Admitting: Cardiology

## 2008-06-09 ENCOUNTER — Encounter: Payer: Self-pay | Admitting: Internal Medicine

## 2008-06-09 ENCOUNTER — Ambulatory Visit: Payer: Self-pay | Admitting: Cardiology

## 2008-06-09 ENCOUNTER — Ambulatory Visit: Payer: Self-pay | Admitting: Internal Medicine

## 2008-06-13 ENCOUNTER — Encounter: Payer: Self-pay | Admitting: Internal Medicine

## 2008-07-14 ENCOUNTER — Encounter: Payer: Self-pay | Admitting: Internal Medicine

## 2008-07-14 ENCOUNTER — Ambulatory Visit: Payer: Self-pay | Admitting: Internal Medicine

## 2008-07-14 ENCOUNTER — Encounter (INDEPENDENT_AMBULATORY_CARE_PROVIDER_SITE_OTHER): Payer: Self-pay | Admitting: *Deleted

## 2008-07-14 ENCOUNTER — Ambulatory Visit: Payer: Self-pay

## 2008-07-14 DIAGNOSIS — R079 Chest pain, unspecified: Secondary | ICD-10-CM | POA: Insufficient documentation

## 2008-07-18 ENCOUNTER — Encounter: Payer: Self-pay | Admitting: *Deleted

## 2008-07-19 ENCOUNTER — Encounter: Payer: Self-pay | Admitting: Internal Medicine

## 2008-07-19 LAB — CONVERTED CEMR LAB
ALT: 42 units/L — ABNORMAL HIGH (ref 0–35)
Albumin: 3.6 g/dL (ref 3.5–5.2)
Alkaline Phosphatase: 86 units/L (ref 39–117)
BUN: 15 mg/dL (ref 6–23)
Basophils Absolute: 0.1 10*3/uL (ref 0.0–0.1)
Basophils Relative: 0.7 % (ref 0.0–3.0)
Bilirubin, Direct: 0.3 mg/dL (ref 0.0–0.3)
Chloride: 111 meq/L (ref 96–112)
Glucose, Bld: 95 mg/dL (ref 70–99)
HCT: 38 % (ref 36.0–46.0)
Hemoglobin: 13 g/dL (ref 12.0–15.0)
Lymphs Abs: 2.3 10*3/uL (ref 0.7–4.0)
MCHC: 34.2 g/dL (ref 30.0–36.0)
Monocytes Relative: 7.9 % (ref 3.0–12.0)
Neutro Abs: 4.2 10*3/uL (ref 1.4–7.7)
Potassium: 3.8 meq/L (ref 3.5–5.1)
Pro B Natriuretic peptide (BNP): 325 pg/mL — ABNORMAL HIGH (ref 0.0–100.0)
RBC: 4.18 M/uL (ref 3.87–5.11)
RDW: 13.3 % (ref 11.5–14.6)
Sodium: 142 meq/L (ref 135–145)
Total Protein: 7 g/dL (ref 6.0–8.3)

## 2008-07-21 ENCOUNTER — Ambulatory Visit: Payer: Self-pay

## 2008-07-21 ENCOUNTER — Encounter: Payer: Self-pay | Admitting: Internal Medicine

## 2008-08-01 ENCOUNTER — Ambulatory Visit: Payer: Self-pay | Admitting: Internal Medicine

## 2008-08-07 ENCOUNTER — Telehealth: Payer: Self-pay | Admitting: Internal Medicine

## 2008-08-11 ENCOUNTER — Ambulatory Visit: Payer: Self-pay | Admitting: Cardiology

## 2008-08-11 ENCOUNTER — Encounter (INDEPENDENT_AMBULATORY_CARE_PROVIDER_SITE_OTHER): Payer: Self-pay | Admitting: Pharmacist

## 2008-08-23 ENCOUNTER — Encounter: Payer: Self-pay | Admitting: *Deleted

## 2008-08-28 ENCOUNTER — Emergency Department (HOSPITAL_COMMUNITY): Admission: EM | Admit: 2008-08-28 | Discharge: 2008-08-28 | Payer: Self-pay | Admitting: Family Medicine

## 2008-09-05 ENCOUNTER — Ambulatory Visit: Payer: Self-pay | Admitting: Cardiology

## 2008-09-05 ENCOUNTER — Ambulatory Visit: Payer: Self-pay | Admitting: Internal Medicine

## 2008-09-05 LAB — CONVERTED CEMR LAB: POC INR: 2.7

## 2008-09-08 ENCOUNTER — Encounter (INDEPENDENT_AMBULATORY_CARE_PROVIDER_SITE_OTHER): Payer: Self-pay | Admitting: *Deleted

## 2008-09-13 LAB — CONVERTED CEMR LAB
Basophils Relative: 3.5 % — ABNORMAL HIGH (ref 0.0–3.0)
Eosinophils Relative: 2 % (ref 0.0–5.0)
Lymphocytes Relative: 54.2 % — ABNORMAL HIGH (ref 12.0–46.0)
MCV: 91.1 fL (ref 78.0–100.0)
Monocytes Absolute: 0.3 10*3/uL (ref 0.1–1.0)
Monocytes Relative: 9.3 % (ref 3.0–12.0)
Neutrophils Relative %: 31 % — ABNORMAL LOW (ref 43.0–77.0)
Platelets: 211 10*3/uL (ref 150.0–400.0)
RBC: 4.45 M/uL (ref 3.87–5.11)
Rhuematoid fact SerPl-aCnc: <20 intl units/mL (ref 0.0–20.0)
Sed Rate: 50 mm/hr — ABNORMAL HIGH (ref 0–22)
Total CK: 123 units/L (ref 7–177)
Uric Acid, Serum: 4.7 mg/dL (ref 2.4–7.0)
WBC: 2.8 10*3/uL — ABNORMAL LOW (ref 4.5–10.5)

## 2008-09-18 ENCOUNTER — Encounter: Admission: RE | Admit: 2008-09-18 | Discharge: 2008-09-18 | Payer: Self-pay | Admitting: Internal Medicine

## 2008-09-18 ENCOUNTER — Telehealth: Payer: Self-pay | Admitting: Internal Medicine

## 2008-09-18 ENCOUNTER — Ambulatory Visit: Payer: Self-pay | Admitting: Internal Medicine

## 2008-09-19 ENCOUNTER — Ambulatory Visit: Payer: Self-pay | Admitting: Internal Medicine

## 2008-09-19 LAB — CONVERTED CEMR LAB: INR: 1.8

## 2008-09-20 ENCOUNTER — Ambulatory Visit: Payer: Self-pay

## 2008-09-20 ENCOUNTER — Encounter: Payer: Self-pay | Admitting: Internal Medicine

## 2008-09-21 ENCOUNTER — Telehealth: Payer: Self-pay | Admitting: Internal Medicine

## 2008-09-21 ENCOUNTER — Telehealth (INDEPENDENT_AMBULATORY_CARE_PROVIDER_SITE_OTHER): Payer: Self-pay | Admitting: *Deleted

## 2008-09-22 ENCOUNTER — Encounter (INDEPENDENT_AMBULATORY_CARE_PROVIDER_SITE_OTHER): Payer: Self-pay | Admitting: *Deleted

## 2008-09-22 ENCOUNTER — Ambulatory Visit: Payer: Self-pay | Admitting: Internal Medicine

## 2008-09-22 ENCOUNTER — Encounter (INDEPENDENT_AMBULATORY_CARE_PROVIDER_SITE_OTHER): Payer: Self-pay | Admitting: Cardiology

## 2008-09-22 ENCOUNTER — Encounter: Payer: Self-pay | Admitting: Cardiology

## 2008-09-22 ENCOUNTER — Telehealth: Payer: Self-pay | Admitting: Internal Medicine

## 2008-09-28 ENCOUNTER — Telehealth: Payer: Self-pay | Admitting: Internal Medicine

## 2008-09-29 ENCOUNTER — Ambulatory Visit: Payer: Self-pay | Admitting: Internal Medicine

## 2008-09-30 LAB — CONVERTED CEMR LAB
ALT: 27 units/L (ref 0–35)
AST: 21 units/L (ref 0–37)
BUN: 16 mg/dL (ref 6–23)
Basophils Absolute: 0.1 10*3/uL (ref 0.0–0.1)
Basophils Relative: 0.8 % (ref 0.0–3.0)
Bilirubin, Direct: 0.2 mg/dL (ref 0.0–0.3)
CO2: 27 meq/L (ref 19–32)
Chloride: 102 meq/L (ref 96–112)
Eosinophils Relative: 2.2 % (ref 0.0–5.0)
Hemoglobin: 13.5 g/dL (ref 12.0–15.0)
Lymphocytes Relative: 22.2 % (ref 12.0–46.0)
Monocytes Relative: 4.7 % (ref 3.0–12.0)
Neutro Abs: 6.1 10*3/uL (ref 1.4–7.7)
Potassium: 4.3 meq/L (ref 3.5–5.1)
RBC: 4.46 M/uL (ref 3.87–5.11)
RDW: 13.7 % (ref 11.5–14.6)
Rhuematoid fact SerPl-aCnc: <20 intl units/mL (ref 0.0–20.0)
Total Bilirubin: 1.1 mg/dL (ref 0.3–1.2)
WBC: 8.7 10*3/uL (ref 4.5–10.5)

## 2008-10-03 ENCOUNTER — Telehealth: Payer: Self-pay | Admitting: Cardiology

## 2008-10-03 ENCOUNTER — Encounter (INDEPENDENT_AMBULATORY_CARE_PROVIDER_SITE_OTHER): Payer: Self-pay | Admitting: *Deleted

## 2008-10-05 ENCOUNTER — Telehealth: Payer: Self-pay | Admitting: Cardiology

## 2008-10-12 ENCOUNTER — Encounter: Payer: Self-pay | Admitting: Internal Medicine

## 2008-10-12 ENCOUNTER — Ambulatory Visit: Payer: Self-pay | Admitting: Cardiology

## 2008-10-12 DIAGNOSIS — I300 Acute nonspecific idiopathic pericarditis: Secondary | ICD-10-CM | POA: Insufficient documentation

## 2008-10-13 ENCOUNTER — Telehealth: Payer: Self-pay | Admitting: Cardiology

## 2008-10-16 ENCOUNTER — Telehealth (INDEPENDENT_AMBULATORY_CARE_PROVIDER_SITE_OTHER): Payer: Self-pay | Admitting: *Deleted

## 2008-10-17 ENCOUNTER — Ambulatory Visit: Payer: Self-pay | Admitting: Internal Medicine

## 2008-10-24 ENCOUNTER — Telehealth (INDEPENDENT_AMBULATORY_CARE_PROVIDER_SITE_OTHER): Payer: Self-pay | Admitting: *Deleted

## 2008-10-24 ENCOUNTER — Encounter (INDEPENDENT_AMBULATORY_CARE_PROVIDER_SITE_OTHER): Payer: Self-pay | Admitting: *Deleted

## 2008-10-24 DIAGNOSIS — L659 Nonscarring hair loss, unspecified: Secondary | ICD-10-CM | POA: Insufficient documentation

## 2008-10-25 ENCOUNTER — Encounter (INDEPENDENT_AMBULATORY_CARE_PROVIDER_SITE_OTHER): Payer: Self-pay | Admitting: *Deleted

## 2008-10-30 ENCOUNTER — Ambulatory Visit: Payer: Self-pay

## 2008-10-30 ENCOUNTER — Encounter: Payer: Self-pay | Admitting: Cardiology

## 2008-10-31 ENCOUNTER — Ambulatory Visit: Payer: Self-pay | Admitting: Internal Medicine

## 2008-11-01 ENCOUNTER — Telehealth: Payer: Self-pay | Admitting: Internal Medicine

## 2008-11-21 ENCOUNTER — Telehealth: Payer: Self-pay | Admitting: Internal Medicine

## 2008-11-27 ENCOUNTER — Ambulatory Visit: Payer: Self-pay | Admitting: Internal Medicine

## 2008-11-30 ENCOUNTER — Encounter: Admission: RE | Admit: 2008-11-30 | Discharge: 2008-11-30 | Payer: Self-pay | Admitting: Gynecology

## 2008-11-30 ENCOUNTER — Ambulatory Visit: Payer: Self-pay | Admitting: Internal Medicine

## 2008-12-04 ENCOUNTER — Ambulatory Visit: Payer: Self-pay | Admitting: Cardiovascular Disease

## 2008-12-04 LAB — CONVERTED CEMR LAB: POC INR: 2.5

## 2008-12-26 ENCOUNTER — Telehealth: Payer: Self-pay | Admitting: Cardiology

## 2008-12-27 ENCOUNTER — Ambulatory Visit: Payer: Self-pay | Admitting: Internal Medicine

## 2008-12-27 ENCOUNTER — Telehealth (INDEPENDENT_AMBULATORY_CARE_PROVIDER_SITE_OTHER): Payer: Self-pay | Admitting: *Deleted

## 2008-12-27 LAB — CONVERTED CEMR LAB: POC INR: 3.6

## 2008-12-29 ENCOUNTER — Encounter (INDEPENDENT_AMBULATORY_CARE_PROVIDER_SITE_OTHER): Payer: Self-pay | Admitting: *Deleted

## 2009-01-22 ENCOUNTER — Encounter: Payer: Self-pay | Admitting: Internal Medicine

## 2009-01-23 ENCOUNTER — Ambulatory Visit: Payer: Self-pay | Admitting: Cardiovascular Disease

## 2009-01-23 ENCOUNTER — Encounter (INDEPENDENT_AMBULATORY_CARE_PROVIDER_SITE_OTHER): Payer: Self-pay | Admitting: Cardiology

## 2009-01-23 ENCOUNTER — Ambulatory Visit: Payer: Self-pay | Admitting: Internal Medicine

## 2009-02-17 DIAGNOSIS — Z860100 Personal history of colon polyps, unspecified: Secondary | ICD-10-CM

## 2009-02-17 HISTORY — DX: Personal history of colon polyps, unspecified: Z86.0100

## 2009-02-17 HISTORY — PX: OTHER SURGICAL HISTORY: SHX169

## 2009-02-17 HISTORY — DX: Personal history of colonic polyps: Z86.010

## 2009-02-20 ENCOUNTER — Ambulatory Visit: Payer: Self-pay | Admitting: Cardiology

## 2009-02-20 LAB — CONVERTED CEMR LAB: POC INR: 3.4

## 2009-03-01 ENCOUNTER — Ambulatory Visit: Payer: Self-pay | Admitting: Internal Medicine

## 2009-03-02 ENCOUNTER — Encounter: Payer: Self-pay | Admitting: Internal Medicine

## 2009-03-04 LAB — CONVERTED CEMR LAB
Albumin: 4.1 g/dL (ref 3.5–5.2)
Creatinine, Ser: 0.99 mg/dL (ref 0.40–1.20)
Potassium: 4.3 meq/L (ref 3.5–5.3)
Total Bilirubin: 1.1 mg/dL (ref 0.3–1.2)

## 2009-03-05 ENCOUNTER — Encounter (INDEPENDENT_AMBULATORY_CARE_PROVIDER_SITE_OTHER): Payer: Self-pay | Admitting: *Deleted

## 2009-03-06 ENCOUNTER — Encounter (INDEPENDENT_AMBULATORY_CARE_PROVIDER_SITE_OTHER): Payer: Self-pay | Admitting: *Deleted

## 2009-03-06 ENCOUNTER — Telehealth (INDEPENDENT_AMBULATORY_CARE_PROVIDER_SITE_OTHER): Payer: Self-pay | Admitting: *Deleted

## 2009-03-06 LAB — CONVERTED CEMR LAB
Basophils Absolute: 0.2 10*3/uL — ABNORMAL HIGH (ref 0.0–0.1)
Lymphocytes Relative: 52.5 % — ABNORMAL HIGH (ref 12.0–46.0)
Monocytes Relative: 24.2 % — ABNORMAL HIGH (ref 3.0–12.0)
Platelets: 197 10*3/uL (ref 150.0–400.0)
RDW: 14.4 % (ref 11.5–14.6)
TSH: 1.05 microintl units/mL (ref 0.35–5.50)

## 2009-03-07 ENCOUNTER — Telehealth: Payer: Self-pay | Admitting: Internal Medicine

## 2009-03-09 ENCOUNTER — Telehealth: Payer: Self-pay | Admitting: Cardiology

## 2009-03-09 ENCOUNTER — Telehealth: Payer: Self-pay | Admitting: Internal Medicine

## 2009-03-12 ENCOUNTER — Telehealth: Payer: Self-pay | Admitting: Internal Medicine

## 2009-03-13 ENCOUNTER — Ambulatory Visit: Payer: Self-pay | Admitting: Cardiovascular Disease

## 2009-03-13 LAB — CONVERTED CEMR LAB: POC INR: 2.3

## 2009-03-20 ENCOUNTER — Ambulatory Visit: Payer: Self-pay | Admitting: Gastroenterology

## 2009-03-20 ENCOUNTER — Telehealth: Payer: Self-pay | Admitting: Gastroenterology

## 2009-03-20 DIAGNOSIS — D126 Benign neoplasm of colon, unspecified: Secondary | ICD-10-CM | POA: Insufficient documentation

## 2009-03-20 DIAGNOSIS — I429 Cardiomyopathy, unspecified: Secondary | ICD-10-CM | POA: Insufficient documentation

## 2009-03-21 ENCOUNTER — Encounter: Payer: Self-pay | Admitting: Physician Assistant

## 2009-03-23 ENCOUNTER — Ambulatory Visit: Payer: Self-pay | Admitting: Internal Medicine

## 2009-03-29 ENCOUNTER — Telehealth: Payer: Self-pay | Admitting: Internal Medicine

## 2009-03-30 ENCOUNTER — Ambulatory Visit: Payer: Self-pay | Admitting: Internal Medicine

## 2009-03-30 LAB — CONVERTED CEMR LAB: POC INR: 3.7

## 2009-04-02 ENCOUNTER — Telehealth (INDEPENDENT_AMBULATORY_CARE_PROVIDER_SITE_OTHER): Payer: Self-pay | Admitting: *Deleted

## 2009-04-04 ENCOUNTER — Telehealth: Payer: Self-pay | Admitting: Physician Assistant

## 2009-04-06 ENCOUNTER — Encounter: Payer: Self-pay | Admitting: Internal Medicine

## 2009-04-06 ENCOUNTER — Encounter: Payer: Self-pay | Admitting: Gastroenterology

## 2009-04-09 ENCOUNTER — Ambulatory Visit: Payer: Self-pay | Admitting: Internal Medicine

## 2009-04-09 LAB — CONVERTED CEMR LAB: POC INR: 2.3

## 2009-04-10 ENCOUNTER — Telehealth: Payer: Self-pay | Admitting: Internal Medicine

## 2009-04-30 ENCOUNTER — Encounter (INDEPENDENT_AMBULATORY_CARE_PROVIDER_SITE_OTHER): Payer: Self-pay | Admitting: *Deleted

## 2009-05-01 ENCOUNTER — Ambulatory Visit: Payer: Self-pay | Admitting: Gastroenterology

## 2009-05-02 ENCOUNTER — Telehealth: Payer: Self-pay | Admitting: Gastroenterology

## 2009-05-10 ENCOUNTER — Ambulatory Visit: Payer: Self-pay | Admitting: Cardiology

## 2009-05-10 ENCOUNTER — Emergency Department (HOSPITAL_COMMUNITY): Admission: EM | Admit: 2009-05-10 | Discharge: 2009-05-10 | Payer: Self-pay | Admitting: Emergency Medicine

## 2009-05-17 ENCOUNTER — Telehealth: Payer: Self-pay | Admitting: Gastroenterology

## 2009-05-18 ENCOUNTER — Ambulatory Visit: Payer: Self-pay | Admitting: Family Medicine

## 2009-05-18 DIAGNOSIS — IMO0002 Reserved for concepts with insufficient information to code with codable children: Secondary | ICD-10-CM | POA: Insufficient documentation

## 2009-05-18 DIAGNOSIS — S62609A Fracture of unspecified phalanx of unspecified finger, initial encounter for closed fracture: Secondary | ICD-10-CM | POA: Insufficient documentation

## 2009-05-21 ENCOUNTER — Ambulatory Visit: Payer: Self-pay

## 2009-05-21 ENCOUNTER — Encounter: Payer: Self-pay | Admitting: Internal Medicine

## 2009-05-22 ENCOUNTER — Telehealth (INDEPENDENT_AMBULATORY_CARE_PROVIDER_SITE_OTHER): Payer: Self-pay | Admitting: *Deleted

## 2009-05-22 ENCOUNTER — Encounter: Payer: Self-pay | Admitting: Family Medicine

## 2009-05-22 ENCOUNTER — Telehealth: Payer: Self-pay | Admitting: Internal Medicine

## 2009-05-23 ENCOUNTER — Encounter: Payer: Self-pay | Admitting: Internal Medicine

## 2009-06-05 ENCOUNTER — Ambulatory Visit: Payer: Self-pay | Admitting: Cardiovascular Disease

## 2009-06-06 ENCOUNTER — Telehealth: Payer: Self-pay | Admitting: Gastroenterology

## 2009-06-19 ENCOUNTER — Ambulatory Visit: Payer: Self-pay | Admitting: Gastroenterology

## 2009-06-20 ENCOUNTER — Encounter: Payer: Self-pay | Admitting: Gastroenterology

## 2009-06-21 ENCOUNTER — Encounter: Payer: Self-pay | Admitting: Gastroenterology

## 2009-06-25 ENCOUNTER — Ambulatory Visit: Payer: Self-pay | Admitting: Cardiology

## 2009-07-05 ENCOUNTER — Encounter: Payer: Self-pay | Admitting: Internal Medicine

## 2009-07-06 ENCOUNTER — Encounter: Admission: RE | Admit: 2009-07-06 | Discharge: 2009-07-06 | Payer: Self-pay | Admitting: Gynecology

## 2009-07-17 ENCOUNTER — Ambulatory Visit: Payer: Self-pay | Admitting: Internal Medicine

## 2009-07-17 LAB — CONVERTED CEMR LAB: POC INR: 2.3

## 2009-08-01 ENCOUNTER — Ambulatory Visit: Payer: Self-pay | Admitting: Internal Medicine

## 2009-08-01 ENCOUNTER — Encounter: Payer: Self-pay | Admitting: Internal Medicine

## 2009-08-02 ENCOUNTER — Telehealth: Payer: Self-pay | Admitting: Gastroenterology

## 2009-08-08 ENCOUNTER — Ambulatory Visit: Payer: Self-pay | Admitting: Gastroenterology

## 2009-08-08 DIAGNOSIS — R1031 Right lower quadrant pain: Secondary | ICD-10-CM | POA: Insufficient documentation

## 2009-08-08 DIAGNOSIS — R197 Diarrhea, unspecified: Secondary | ICD-10-CM | POA: Insufficient documentation

## 2009-08-09 ENCOUNTER — Encounter: Payer: Self-pay | Admitting: Gastroenterology

## 2009-08-14 ENCOUNTER — Ambulatory Visit: Payer: Self-pay | Admitting: Internal Medicine

## 2009-08-14 LAB — CONVERTED CEMR LAB: POC INR: 2.4

## 2009-08-16 ENCOUNTER — Ambulatory Visit: Payer: Self-pay | Admitting: Cardiology

## 2009-09-11 ENCOUNTER — Ambulatory Visit: Payer: Self-pay | Admitting: Cardiology

## 2009-09-11 LAB — CONVERTED CEMR LAB: POC INR: 2.9

## 2009-09-20 ENCOUNTER — Ambulatory Visit: Payer: Self-pay | Admitting: Gastroenterology

## 2009-09-21 LAB — CONVERTED CEMR LAB: Tissue Transglutaminase Ab, IgA: 3.7 units (ref <20)

## 2009-10-04 ENCOUNTER — Ambulatory Visit: Payer: Self-pay | Admitting: Cardiovascular Disease

## 2009-10-04 LAB — CONVERTED CEMR LAB: POC INR: 3.1

## 2009-10-19 ENCOUNTER — Ambulatory Visit: Payer: Self-pay | Admitting: Internal Medicine

## 2009-11-01 ENCOUNTER — Ambulatory Visit: Payer: Self-pay | Admitting: Cardiology

## 2009-11-01 ENCOUNTER — Ambulatory Visit: Payer: Self-pay | Admitting: Internal Medicine

## 2009-11-02 ENCOUNTER — Ambulatory Visit: Payer: Self-pay | Admitting: Internal Medicine

## 2009-11-22 ENCOUNTER — Ambulatory Visit: Payer: Self-pay | Admitting: Internal Medicine

## 2009-11-27 ENCOUNTER — Encounter: Payer: Self-pay | Admitting: Internal Medicine

## 2009-11-28 ENCOUNTER — Encounter: Payer: Self-pay | Admitting: Internal Medicine

## 2009-11-30 ENCOUNTER — Encounter: Payer: Self-pay | Admitting: Internal Medicine

## 2009-11-30 ENCOUNTER — Ambulatory Visit: Payer: Self-pay | Admitting: Cardiology

## 2009-11-30 ENCOUNTER — Encounter (INDEPENDENT_AMBULATORY_CARE_PROVIDER_SITE_OTHER): Payer: Self-pay | Admitting: *Deleted

## 2009-11-30 ENCOUNTER — Encounter: Admission: RE | Admit: 2009-11-30 | Discharge: 2009-11-30 | Payer: Self-pay | Admitting: Gynecology

## 2009-11-30 LAB — CONVERTED CEMR LAB: POC INR: 2.8

## 2009-12-03 ENCOUNTER — Encounter: Payer: Self-pay | Admitting: Internal Medicine

## 2009-12-05 ENCOUNTER — Encounter (INDEPENDENT_AMBULATORY_CARE_PROVIDER_SITE_OTHER): Payer: Self-pay | Admitting: *Deleted

## 2009-12-05 ENCOUNTER — Emergency Department (HOSPITAL_COMMUNITY): Admission: EM | Admit: 2009-12-05 | Discharge: 2009-12-05 | Payer: Self-pay | Admitting: Emergency Medicine

## 2009-12-07 ENCOUNTER — Emergency Department (HOSPITAL_COMMUNITY): Admission: EM | Admit: 2009-12-07 | Discharge: 2009-12-07 | Payer: Self-pay | Admitting: Emergency Medicine

## 2009-12-17 ENCOUNTER — Telehealth: Payer: Self-pay | Admitting: Gastroenterology

## 2009-12-19 ENCOUNTER — Ambulatory Visit: Payer: Self-pay | Admitting: Gastroenterology

## 2009-12-19 LAB — CONVERTED CEMR LAB: TSH: 0.56 microintl units/mL (ref 0.35–5.50)

## 2009-12-20 ENCOUNTER — Telehealth: Payer: Self-pay | Admitting: Gastroenterology

## 2009-12-20 ENCOUNTER — Encounter: Payer: Self-pay | Admitting: Gastroenterology

## 2009-12-20 ENCOUNTER — Ambulatory Visit: Payer: Self-pay | Admitting: Cardiology

## 2009-12-22 ENCOUNTER — Encounter: Payer: Self-pay | Admitting: Gastroenterology

## 2009-12-25 ENCOUNTER — Telehealth: Payer: Self-pay | Admitting: Gastroenterology

## 2010-01-22 ENCOUNTER — Ambulatory Visit: Payer: Self-pay | Admitting: Internal Medicine

## 2010-01-22 ENCOUNTER — Ambulatory Visit: Payer: Self-pay | Admitting: Cardiovascular Disease

## 2010-01-22 LAB — CONVERTED CEMR LAB: POC INR: 3.3

## 2010-02-12 ENCOUNTER — Ambulatory Visit: Payer: Self-pay | Admitting: Internal Medicine

## 2010-02-20 ENCOUNTER — Encounter
Admission: RE | Admit: 2010-02-20 | Discharge: 2010-02-20 | Payer: Self-pay | Source: Home / Self Care | Attending: Gynecology | Admitting: Gynecology

## 2010-02-20 ENCOUNTER — Ambulatory Visit: Admission: RE | Admit: 2010-02-20 | Discharge: 2010-02-20 | Payer: Self-pay | Source: Home / Self Care

## 2010-02-20 LAB — CONVERTED CEMR LAB: POC INR: 2.6

## 2010-03-06 ENCOUNTER — Ambulatory Visit: Admission: RE | Admit: 2010-03-06 | Discharge: 2010-03-06 | Payer: Self-pay | Source: Home / Self Care

## 2010-03-06 LAB — CONVERTED CEMR LAB: POC INR: 2.6

## 2010-03-10 ENCOUNTER — Encounter: Payer: Self-pay | Admitting: Gastroenterology

## 2010-03-10 ENCOUNTER — Encounter: Payer: Self-pay | Admitting: Gynecology

## 2010-03-17 LAB — CONVERTED CEMR LAB
Basophils Relative: 1.4 % (ref 0.0–3.0)
CK-MB: 0.7 ng/mL (ref 0.3–4.0)
Eosinophils Relative: 0.5 % (ref 0.0–5.0)
HDL: 34.6 mg/dL — ABNORMAL LOW (ref >39.0)
Hemoglobin: 12.6 g/dL (ref 12.0–15.0)
Lymphocytes Relative: 21.5 % (ref 12.0–46.0)
Monocytes Relative: 9.6 % (ref 3.0–12.0)
Neutro Abs: 5.5 10*3/uL (ref 1.4–7.7)
Neutrophils Relative %: 67 % (ref 43.0–77.0)
RBC: 4.03 M/uL (ref 3.87–5.11)
TSH: 0.22 microintl units/mL — ABNORMAL LOW (ref 0.35–5.50)
Total CHOL/HDL Ratio: 5
Triglycerides: 135 mg/dL (ref 0–149)
WBC: 8.2 10*3/uL (ref 4.5–10.5)

## 2010-03-19 NOTE — Progress Notes (Signed)
Summary: Michele [name] Furosemide  Phone Note Call from Patient Call back at [facility] Phone [phone]   Caller: Patient Summary of Call: Message left on VM: " This is a message for Dr.Venise Ellingwood" no futher info given.   I spoke with patient, patient stopped Furosemide on her own, This was RX'ed by Dr.Klien. Patient's symptoms are completely cleared. Patient just wanted to call and give Dr.Jalyiah Shelley an update. Patient left message for Dr.Klien's office to futher address./[name]  March 12, 2009 5:04 PM     Follow-up for Phone Call        noted Follow-up by: Michele Melnick MD,  March 12, 2009 5:23 PM

## 2010-03-19 NOTE — Letter (Signed)
Summary: [facility]  [facility]   Imported By: Marylou Mccoy 07/23/2009 16:08:03  _____________________________________________________________________  External Attachment:    Type:   Image     Comment:   External Document

## 2010-03-19 NOTE — Assessment & Plan Note (Signed)
Summary: FOR LEFT SMALL FINGER PROBLEM//PH   Vital Signs:  Patient profile:   77 year old female Weight:      173 pounds Pulse rate:   78 / minute Pulse rhythm:   regular BP sitting:   114 / 72  (left arm) Cuff size:   regular  Vitals Entered By: Army Fossa CMA (May 18, 2009 10:53 AM) CC: Pts left small finger has a small blister on it, Dr.Hopper gave her an ATB, she was seen at [facility] after that and the tip of her finger is broken.   History of Present Illness: Pt here c/o Left 5th finger pain.  Pt was here with her husband on 3/16 and hopp wrote an rx for her but she can not remember what it was for.  It was not getting better so she went to [facility]---they found avulsion fracture.  Pt was given a splint and sent home.  Pt still c/o of very painful spot on corner of finger nail that will not heal.  Pt has been soaking it in H2O2 with little relief.    We called [facility] and she was on [place].      Current Medications (verified): 1)  Multivitamins  Tabs (Multiple Vitamin) .... Take 1 Tablet By Mouth Once A Day 2)  Synthroid 125 Mcg  Tabs (Levothyroxine Sodium) .... 1/2 Pill On  Mon,weds, Fri & Sun ; 1 Pill T,th,sat 3)  Vitamin D3 1000 Unit  Caps (Cholecalciferol) .Marland Kitchen.. 1 By Mouth Once Daily 4)  Calcium Carbonate 600 Mg Tabs (Calcium Carbonate) .... Take 1 Tablet Two Times A Day 5)  Coenzyme Q10 200 Mg Caps (Coenzyme Q10) .... Take 1 Tablet By Mouth Once A Day 6)  Salmon Oil  Caps (Nutritional Supplements) .... 1000mg  Daily 7)  Warfarin Sodium 5 Mg Tabs (Warfarin Sodium) .... Use As Directed By Anticoagulation Clinic 8)  Biotin 1000 Mcg Tabs (Biotin) .Marland Kitchen.. 1 By Mouth Once Daily 9)  Florastor 250 Mg Caps (Saccharomyces Boulardii) .Marland Kitchen.. 1 By Mouth Once Daily 10)  Creon 6000 Unit Cpep (Pancrelipase (Lip-Prot-Amyl)) .Marland Kitchen.. 1 Three Times A Day With Meals (Not Taking) 11)  Florastor 250 Mg Caps (Saccharomyces Boulardii) .... Take 1 Tab Twice Daily  Allergies: 1)   ! Colchicine 2)  Lamisil (Terbinafine Hcl) 3)  Erythromycin  Review of Systems      See HPI  Physical Exam  General:  Well-developed,well-nourished,in no acute distress; alert,appropriate and cooperative throughout examination Msk:  Left 5th finger ---  + calloused area in corner of finger,  very tender to touch,  no drainage,  no pus Psych:  Oriented X3 and normally interactive.     Impression & Recommendations:  Problem # 1:  FRACTURE, FINGER (ICD-816.00) ? paranychia vs ingrown nail another rx keflex given to pt secondary to only using for 5  days  con't soaking finger  con't splint Orders: Orthopedic Surgeon Referral (Ortho Surgeon)  Problem # 2:  PARONYCHIA (ICD-681.9)  The following medications were removed from the medication list:    Flagyl 500 Mg Tabs (Metronidazole) .Marland Kitchen... Take 1/2 tab 4 times daily Her updated medication list for this problem includes:    Keflex 500 Mg Caps (Cephalexin) .Marland Kitchen... 1 by mouth two times a day  Complete Medication List: 1)  Multivitamins Tabs (Multiple vitamin) .... Take 1 tablet by mouth once a day 2)  Synthroid 125 Mcg Tabs (Levothyroxine sodium) .... 1/2 pill on  mon,weds, fri & sun ; 1  pill t,th,sat 3)  Vitamin D3 1000 Unit Caps (Cholecalciferol) .Marland Kitchen.. 1 by mouth once daily 4)  Calcium Carbonate 600 Mg Tabs (Calcium carbonate) .... Take 1 tablet two times a day 5)  Coenzyme Q10 200 Mg Caps (Coenzyme q10) .... Take 1 tablet by mouth once a day 6)  Salmon Oil Caps (Nutritional supplements) .... 1000mg  daily 7)  Warfarin Sodium 5 Mg Tabs (Warfarin sodium) .... Use as directed by anticoagulation clinic 8)  Biotin 1000 Mcg Tabs (Biotin) .Marland Kitchen.. 1 by mouth once daily 9)  Florastor 250 Mg Caps (Saccharomyces boulardii) .Marland Kitchen.. 1 by mouth once daily 10)  Creon 6000 Unit Cpep (Pancrelipase (lip-prot-amyl)) .Marland Kitchen.. 1 three times a day with meals (not taking) 11)  Florastor 250 Mg Caps (Saccharomyces boulardii) .... Take 1 tab twice daily 12)  Keflex 500  Mg Caps (Cephalexin) .Marland Kitchen.. 1 by mouth two times a day Prescriptions: KEFLEX 500 MG CAPS (CEPHALEXIN) 1 by mouth two times a day  #14 x 0   Entered and Authorized by:   Loreen Freud DO   Signed by:   Loreen Freud DO on 05/18/2009   Method used:   Electronically to        [facility]* (retail)       [address]       [place], [place]  [id number]       Ph: [phone]       Fax: [phone]   RxID:   [id number]

## 2010-03-19 NOTE — Progress Notes (Signed)
Summary: refill-hopper  Phone Note Refill Request Call back at Home Phone [phone] Message from:  [place]  Refills Requested: Medication #1:  SYNTHROID 125 MCG  TABS 1/2 pill on  Mon   Dosage confirmed as above?Dosage Confirmed   Supply Requested: 3 months Initial call taken by: Jeremy Johann CMA,  April 02, 2009 11:35 AM    Prescriptions: SYNTHROID 125 MCG  TABS (LEVOTHYROXINE SODIUM) 1/2 pill on  Mon,Weds, Fri & Sun ; 1 pill T,Th,Sat Brand medically necessary #90 Each x 3   Entered by:   Shonna Chock   Authorized by:   Marga Melnick MD   Signed by:   Shonna Chock on 04/02/2009   Method used:   Electronically to        [facility]* (retail)       [address]       [place], [place]  [id number]       Ph: [phone]       Fax: [phone]   RxID:   [id number]

## 2010-03-19 NOTE — Medication Information (Signed)
Summary: started on Flagyl on 11/27/09/ewj  Anticoagulant Therapy  Managed by: Reina Fuse, PharmD Referring MD: Valera Castle MD PCP: Marga Melnick, MD Supervising MD: Myrtis Ser MD, Tinnie Gens Indication 1: Atrial Fibrillation (ICD-427.31) Lab Used: [facility] Site: [facility] INR POC 2.8 INR RANGE 2 - 3  Dietary changes: yes       Details: Less PO intake the past few days due to GI upset with Flagyl.  Health status changes: no    Bleeding/hemorrhagic complications: no    Recent/future hospitalizations: no    Any changes in medication regimen? yes       Details: Started on Flagyl and Cipro on Tuesday. Will finish  tomorrow.   Recent/future dental: no  Any missed doses?: no       Is patient compliant with meds? yes      Comments: Has been having diarrhea since starting on Flagyl on Tuesday.   Took half dose last night per MD instruction.   Current Medications (verified): 1)  Multivitamins  Tabs (Multiple Vitamin) .... Take 1 Tablet By Mouth Once A Day 2)  Synthroid 125 Mcg  Tabs (Levothyroxine Sodium) .... 1/2 Pill On  Mon,weds, Fri & Sun ; 1 Pill T,th,sat 3)  Vitamin D3 1000 Unit  Caps (Cholecalciferol) .Marland Kitchen.. 1 By Mouth Once Daily 4)  Calcium Carbonate 600 Mg Tabs (Calcium Carbonate) .... Take 1 Tablet Two Times A Day 5)  Coenzyme Q10 200 Mg Caps (Coenzyme Q10) .... Take 1 Tablet By Mouth Once A Day 6)  Salmon Oil  Caps (Nutritional Supplements) .... 1000mg  Daily 7)  Warfarin Sodium 5 Mg Tabs (Warfarin Sodium) .... Use As Directed By Anticoagulation Clinic 8)  Biotin 1000 Mcg Tabs (Biotin) .Marland Kitchen.. 1 By Mouth Once Daily 9)  Florastor 250 Mg Caps (Saccharomyces Boulardii) .Marland Kitchen.. 1 By Mouth Once Daily 10)  Furosemide 20 Mg Tabs (Furosemide) .... Take One Tablet By Mouth Every Other Day 11)  Levsin 0.125 Mg Tabs (Hyoscyamine Sulfate) .Marland Kitchen.. 1-2 Tablets By Mouth Before Meals Three Times A Day 12)  Vytorin 10-20 Mg Tabs (Ezetimibe-Simvastatin) .Marland Kitchen.. 1 At Bedtime  Allergies (verified): 1)   ! Colchicine 2)  ! Crestor (Rosuvastatin Calcium) 3)  Lamisil (Terbinafine Hcl) 4)  Erythromycin  Anticoagulation Management History:      The patient is taking warfarin and comes in today for a routine follow up visit.  Positive risk factors for bleeding include an age of 26 years or older.  Negative risk factors for bleeding include no history of CVA/TIA.  The bleeding index is 'intermediate risk'.  Positive CHADS2 values include Age > 22 years old.  Negative CHADS2 values include History of HTN, History of Diabetes, and Prior Stroke/CVA/TIA.  The start date was 04/24/2001.  Her last INR was 1.8.  Anticoagulation responsible Doraine Schexnider: Myrtis Ser MD, Tinnie Gens.  INR POC: 2.8.  Cuvette Lot#: [id number].  Exp: 12/2010.    Anticoagulation Management Assessment/Plan:      The patient's current anticoagulation dose is Warfarin sodium 5 mg tabs: Use as directed by Anticoagulation Clinic.  The target INR is 2 - 3.  The next INR is due 12/17/2009.  Anticoagulation instructions were given to patient.  Results were reviewed/authorized by Reina Fuse, PharmD.  She was notified by Reina Fuse PharmD.         Prior Anticoagulation Instructions: INR 2.4  Continue Coumadin as scheduled:  1 tablet (5mg ) every day of the week.  Return to clinic in 4 weeks.    Current Anticoagulation Instructions: INR 2.8  Today,  Friday, October 14th, take Coumadin 2.5 mg (0.5 tab). Then, continue taking Coumadin 5 mg (1 tab) every day. Return to clinic in 2 weeks.

## 2010-03-19 NOTE — Progress Notes (Signed)
Summary: triage  Phone Note Call from Patient Call back at Home Phone [phone]   Caller: husband, John Call For: Dr. [name] Reason for Call: Talk to Nurse Summary of Call: recent ER visit for diverticulitis and was advised to f/u asap with Dr. [name]... husband says GI ov is sch'ed too far out and pt is worsening... would like to be worked in Initial call taken by: Vallarie Mare,  December 17, 2009 10:14 AM  Follow-up for Phone Call        Patient  was treated by her primary care Dr Nicholas Lose for Diverticulitis with Cipro and flagyl.  Patient  was sent to the ER due to inablitiy to void she was told she had a UTI and was treated with Keflex.  Patient  has had a several day hx of diarrhea, "explosive at times" especially after meals.  Patient  is advised to try imodium , bland diet and to push fluids.  Patient  is scheduled for an office visit with Dr [name] for Wed at 11:00 Follow-up by: Darcey Nora RN, CGRN,  December 17, 2009 10:29 AM

## 2010-03-19 NOTE — Medication Information (Signed)
Summary: rov/tm  Anticoagulant Therapy  Managed by: Bethena Midget, RN, BSN Referring MD: Valera Castle MD PCP: Marga Melnick, MD Supervising MD: Gala Romney MD, Reuel Boom Indication 1: Atrial Fibrillation (ICD-427.31) Lab Used: [facility] Site: [facility] INR POC 2.4 INR RANGE 2 - 3  Dietary changes: no    Health status changes: no    Bleeding/hemorrhagic complications: no    Recent/future hospitalizations: no    Any changes in medication regimen? no    Recent/future dental: no  Any missed doses?: no       Is patient compliant with meds? yes       Allergies: 1)  ! Colchicine 2)  Lamisil (Terbinafine Hcl) 3)  Erythromycin  Anticoagulation Management History:      The patient is taking warfarin and comes in today for a routine follow up visit.  Positive risk factors for bleeding include an age of 58 years or older.  Negative risk factors for bleeding include no history of CVA/TIA.  The bleeding index is 'intermediate risk'.  Positive CHADS2 values include Age > 29 years old.  Negative CHADS2 values include History of HTN, History of Diabetes, and Prior Stroke/CVA/TIA.  The start date was 04/24/2001.  Her last INR was 1.8.  Anticoagulation responsible provider: Tanairy Payeur MD, Reuel Boom.  INR POC: 2.4.  Cuvette Lot#: [id number].  Exp: 10/2010.    Anticoagulation Management Assessment/Plan:      The patient's current anticoagulation dose is Warfarin sodium 5 mg tabs: Use as directed by Anticoagulation Clinic.  The target INR is 2 - 3.  The next INR is due 09/11/2009.  Anticoagulation instructions were given to patient.  Results were reviewed/authorized by Bethena Midget, RN, BSN.  She was notified by Bethena Midget, RN, BSN.         Prior Anticoagulation Instructions: INR 2.3 Continue 5mg s daily except 7.5mg s on Thursdays. Recheck in 4 weeks.   Current Anticoagulation Instructions: INR 2.4 Continue 5mg s daily except 7.5mg s on Thursdays. Recheck in 4 weeks.

## 2010-03-19 NOTE — Progress Notes (Signed)
Summary: Give nurse a msg  Phone Note Call from Patient Call back at Work Phone [phone]   Caller: Spouse Call For: DR [name] Initial call taken by: Leanor Kail [facility],  December 20, 2009 2:21 PM  Follow-up for Phone Call        left message for pt  to call back  Follow-up by: Christie Nottingham CMA Duncan Dull),  December 20, 2009 2:34 PM  Additional Follow-up for Phone Call Additional follow up Details #1::        Jonny Ruiz (spouse) of pt states he is upset b/c after dropping off stool cultures for his wife, the lab told him they forgot to give him 2 sterile cups to do two of stool tests. Pt states he doesn't understand why the lab is incompetent. Informed patient that I have already left a message with the lab manager to inform her of what happened.   Pt aslo states his wife has been feeling much better since yesterday. He states she is only had 1 BM today and she has actually been able to eat without rushing to the bathroom to have a BM. Told pt that the doctor will still want these two tests to be done but will ask him to make sure and let him know that she is feeling somewhat better. Spoke with [place] and she states the two tests they we did not get are C.diff with PCR and pancreatic elastace 1. Additional Follow-up by: Christie Nottingham CMA Duncan Dull),  December 20, 2009 3:25 PM    Additional Follow-up for Phone Call Additional follow up Details #2::    Noted. Follow-up by: Meryl Dare MD Clementeen Graham,  December 21, 2009 10:27 AM  Additional Follow-up for Phone Call Additional follow up Details #3:: Details for Additional Follow-up Action Taken: Pt called back to say that him and his wife changed there plans for goin out of town. His wife dropped off her other stool sample to check for c.diff and pancreatic elastace 1.  Additional Follow-up by: Christie Nottingham CMA Duncan Dull),  December 21, 2009 10:41 AM

## 2010-03-19 NOTE — Cardiovascular Report (Signed)
Summary: Office Visit   Office Visit   Imported By: Roderic Ovens 08/01/2009 16:46:26  _____________________________________________________________________  External Attachment:    Type:   Image     Comment:   External Document

## 2010-03-19 NOTE — Medication Information (Signed)
Summary: rov/tm  Anticoagulant Therapy  Managed by: Bethena Midget, RN, BSN Referring MD: Valera Castle MD PCP: Marga Melnick, MD Supervising MD: Juanda Chance MD, Anmol Fleck Indication 1: Atrial Fibrillation (ICD-427.31) Lab Used: [facility] Site: [facility] INR POC 2.9 INR RANGE 2 - 3  Dietary changes: no    Health status changes: no    Bleeding/hemorrhagic complications: no    Recent/future hospitalizations: no    Any changes in medication regimen? no    Recent/future dental: no  Any missed doses?: no       Is patient compliant with meds? yes       Allergies: 1)  ! Colchicine 2)  Lamisil (Terbinafine Hcl) 3)  Erythromycin  Anticoagulation Management History:      The patient is taking warfarin and comes in today for a routine follow up visit.  Positive risk factors for bleeding include an age of 23 years or older.  Negative risk factors for bleeding include no history of CVA/TIA.  The bleeding index is 'intermediate risk'.  Positive CHADS2 values include Age > 5 years old.  Negative CHADS2 values include History of HTN, History of Diabetes, and Prior Stroke/CVA/TIA.  The start date was 04/24/2001.  Her last INR was 1.8.  Anticoagulation responsible provider: Juanda Chance MD, Smitty Cords.  INR POC: 2.9.  Cuvette Lot#: [id number].  Exp: 11/2010.    Anticoagulation Management Assessment/Plan:      The patient's current anticoagulation dose is Warfarin sodium 5 mg tabs: Use as directed by Anticoagulation Clinic.  The target INR is 2 - 3.  The next INR is due 10/09/2009.  Anticoagulation instructions were given to patient.  Results were reviewed/authorized by Bethena Midget, RN, BSN.  She was notified by Bethena Midget, RN, BSN.         Prior Anticoagulation Instructions: INR 2.4 Continue 5mg s daily except 7.5mg s on Thursdays. Recheck in 4 weeks.   Current Anticoagulation Instructions: INR 2.9 Continue 5mg s daily except 7.5mg s on Thursdays. Recheck in 4 weeks.

## 2010-03-19 NOTE — Progress Notes (Signed)
Summary:  QUESTION ABOUT HER COUMADIN  Phone Note Call from Patient Call back at Home Phone [phone]   Caller: Patient Summary of Call: PT HAVE QUESTIONS  ON HER COUMADIN. Initial call taken by: Judie Grieve,  April 10, 2009 10:58 AM  Follow-up for Phone Call        Called spoke with pt.  Pt has colonoscopy on 05/07/09 with Dr [name].  Letter in EMR to Dr Graciela Husbands for anticoag instructions.  Will await his recommendations and schedule appropriate f/u from there.   Follow-up by: Cloyde Reams RN,  April 10, 2009 11:11 AM

## 2010-03-19 NOTE — Progress Notes (Signed)
Summary: Medication  Phone Note Call from Patient   Caller: Maudie Mercury [phone] Call For: Dr. [name] Reason for Call: Talk to Nurse Summary of Call: Has questions about her meds Initial call taken by: Karna Christmas,  December 25, 2009 11:56 AM  Follow-up for Phone Call        Spoke with Jonny Ruiz (spouse) and he states the insurance company needs a PA for the capsules b/c they cannot take the liquid version  on there cruise. Told pt I can call the insurance company to try to get the Vancomycin approved. [employer] company and got Vancomycin approved and then called the pharmacy to tell them the medication was approved. Informed pt and told him to call if he has any other problems. Pt agreed. Follow-up by: Christie Nottingham CMA Duncan Dull),  December 25, 2009 3:28 PM

## 2010-03-19 NOTE — Procedures (Signed)
Summary: device   Current Medications (verified): 1)  Multivitamins  Tabs (Multiple Vitamin) .... Take 1 Tablet By Mouth Once A Day 2)  Synthroid 125 Mcg  Tabs (Levothyroxine Sodium) .... 1/2 Pill On  Mon,weds, Fri & Sun ; 1 Pill T,th,sat 3)  Vitamin D3 1000 Unit  Caps (Cholecalciferol) .Marland Kitchen.. 1 By Mouth Once Daily 4)  Calcium Carbonate 600 Mg Tabs (Calcium Carbonate) .... Take 1 Tablet Two Times A Day 5)  Coenzyme Q10 200 Mg Caps (Coenzyme Q10) .... Take 1 Tablet By Mouth Once A Day 6)  Salmon Oil  Caps (Nutritional Supplements) .... 1000mg  Daily 7)  Warfarin Sodium 5 Mg Tabs (Warfarin Sodium) .... Use As Directed By Anticoagulation Clinic 8)  Biotin 1000 Mcg Tabs (Biotin) .Marland Kitchen.. 1 By Mouth Once Daily 9)  Florastor 250 Mg Caps (Saccharomyces Boulardii) .Marland Kitchen.. 1 By Mouth Once Daily 10)  Creon 6000 Unit Cpep (Pancrelipase (Lip-Prot-Amyl)) .Marland Kitchen.. 1 Three Times A Day With Meals (Not Taking) 11)  Florastor 250 Mg Caps (Saccharomyces Boulardii) .... Take 1 Tab Twice Daily 12)  Keflex 500 Mg Caps (Cephalexin) .Marland Kitchen.. 1 By Mouth Two Times A Day  Allergies (verified): 1)  ! Colchicine 2)  Lamisil (Terbinafine Hcl) 3)  Erythromycin    ICD Specifications Following MD:  Sherryl Manges, MD     ICD Vendor:  [employer]     ICD Model Number:  [phone]     ICD Serial Number:  [id number] ICD DOI:  03/09/2008     ICD Implanting MD:  Sherryl Manges, MD  Lead 1:    Location: RA     DOI: 10/26/2006     Model #: 5621     Serial #: [id number]     Status: active Lead 2:    Location: RV     DOI: 10/26/2006     Model #: 6295     Serial #: [id number]     Status: active Lead 3:    Location: RV     DOI: 03/09/2008     Model #: 7120     Serial #: [id number]     Status: active Lead 4:    Location: LV     DOI: 03/09/2008     Model #: 4403     Serial #: [id number]     Status: active  Indications::  CHF 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  No Battery Voltage:  3.12 V      Charge Time:  8.4 seconds     Underlying rhythm:  AFIB WITH CHB ICD Dependent:  Yes       ICD Device Measurements Atrium:  Amplitude: 1.5 mV, Impedance: 684 ohms,  Right Ventricle:  Amplitude: PACED AT 30 mV, Impedance: 684 ohms, Threshold: 1.0 V at 0.4 msec Left Ventricle:  Impedance: 1045 ohms, Threshold: 1.0 V at 0.4 msec Configuration: LV TIP TO LV RING Shock Impedance: 49/66 ohms   Episodes Percent Mode Switch:  100%     Coumadin:  No Shock:  0     ATP:  0     Nonsustained:  0     Atrial Pacing:  0.1%     Ventricular Pacing:  100%  Brady Parameters Mode DDIR     Lower Rate Limit:  70     Upper Rate Limit 120 PAV 140     Rate Response Parameters:  ADL response-3, Exertion response-3, Threshold-Med/Low, Acceleration-30 seconds, Deceleration-Exercise  Tachy Zones VF:  188  VT:  OFF     Next Cardiology Appt Due:  08/17/2009 Tech Comments:  Normal device function.  No changes made today.  ROV 3 months clinic. Gypsy Balsam RN BSN  May 21, 2009 2:23 PM

## 2010-03-19 NOTE — Medication Information (Signed)
Summary: rov/ewj  Anticoagulant Therapy  Managed by: Bethanne Ginger, PharmD Referring MD: Valera Castle MD PCP: Marga Melnick, MD Supervising MD: Gala Romney MD, Reuel Boom Indication 1: Atrial Fibrillation (ICD-427.31) Lab Used: [facility] Site: [facility] INR POC 2.3 INR RANGE 2 - 3  Dietary changes: no    Health status changes: no    Bleeding/hemorrhagic complications: yes       Details: Few minor nose bleeds, she said this is not unusual  Recent/future hospitalizations: no    Any changes in medication regimen? yes       Details: Finishing 2 week course of flagyl this week  Recent/future dental: no  Any missed doses?: no       Is patient compliant with meds? yes       Current Medications (verified): 1)  Multivitamins  Tabs (Multiple Vitamin) .... Take 1 Tablet By Mouth Once A Day 2)  Synthroid 125 Mcg  Tabs (Levothyroxine Sodium) .... 1/2 Pill On  Mon,weds, Fri & Sun ; 1 Pill T,th,sat 3)  Vitamin D3 1000 Unit  Caps (Cholecalciferol) .Marland Kitchen.. 1 By Mouth Once Daily 4)  Calcium Carbonate 600 Mg Tabs (Calcium Carbonate) .... Take 1 Tablet Two Times A Day 5)  Coenzyme Q10 200 Mg Caps (Coenzyme Q10) .... Take 1 Tablet By Mouth Once A Day 6)  Salmon Oil  Caps (Nutritional Supplements) .... 1000mg  Daily 7)  Warfarin Sodium 5 Mg Tabs (Warfarin Sodium) .... Use As Directed By Anticoagulation Clinic 8)  Biotin 1000 Mcg Tabs (Biotin) .Marland Kitchen.. 1 By Mouth Once Daily 9)  Florastor 250 Mg Caps (Saccharomyces Boulardii) .Marland Kitchen.. 1 By Mouth Once Daily 10)  Creon 6000 Unit Cpep (Pancrelipase (Lip-Prot-Amyl)) .Marland Kitchen.. 1 Three Times A Day With Meals (Not Taking) 11)  Flagyl 500 Mg Tabs (Metronidazole) .... Take 1/2 Tab 4 Times Daily 12)  Florastor 250 Mg Caps (Saccharomyces Boulardii) .... Take 1 Tab Twice Daily  Allergies (verified): 1)  ! Colchicine (Colchicine) 2)  Lamisil (Terbinafine Hcl) 3)  Erythromycin  Anticoagulation Management History:      The patient is taking warfarin and comes in today  for a routine follow up visit.  Positive risk factors for bleeding include an age of 75 years or older.  Negative risk factors for bleeding include no history of CVA/TIA.  The bleeding index is 'intermediate risk'.  Positive CHADS2 values include Age > 7 years old.  Negative CHADS2 values include History of HTN, History of Diabetes, and Prior Stroke/CVA/TIA.  The start date was 04/24/2001.  Her last INR was 1.8.  Anticoagulation responsible provider: Lorie Cleckley MD, Reuel Boom.  INR POC: 2.3.  Cuvette Lot#: [id number].  Exp: 05/2010.    Anticoagulation Management Assessment/Plan:      The patient's current anticoagulation dose is Warfarin sodium 5 mg tabs: Use as directed by Anticoagulation Clinic.  The target INR is 2 - 3.  The next INR is due 05/07/2009.  Anticoagulation instructions were given to patient.  Results were reviewed/authorized by Bethanne Ginger, PharmD.  She was notified by Bethanne Ginger.         Prior Anticoagulation Instructions: INR 3.7  Skip tonight's dose of coumadin then, 1 tablet on Sat and Sun then 1/2 tablet on Monday then resume same dosage 1 tablet daily except 1.5 tablets on Thursdays.    Current Anticoagulation Instructions: INR=2.3 The patient is to continue with the same dose of coumadin.  This dosage includes:  1 tablet (5mg ) daily, except 1 1/2 tablets on Thursday

## 2010-03-19 NOTE — Medication Information (Signed)
Summary: rov/jk  Anticoagulant Therapy  Managed by: Weston Brass, PharmD Referring MD: Valera Castle MD PCP: Marga Melnick, MD Supervising MD: Shirlee Latch MD, Brok Stocking Indication 1: Atrial Fibrillation (ICD-427.31) Lab Used: [facility] Site: [facility] INR POC 3.2 INR RANGE 2 - 3  Dietary changes: no    Health status changes: no    Bleeding/hemorrhagic complications: no    Recent/future hospitalizations: no    Any changes in medication regimen? yes       Details: vytorin x 3 days but then stopped due to dizziness  Recent/future dental: no  Any missed doses?: no       Is patient compliant with meds? yes       Allergies: 1)  ! Colchicine 2)  ! Crestor (Rosuvastatin Calcium) 3)  Lamisil (Terbinafine Hcl) 4)  Erythromycin  Anticoagulation Management History:      The patient is taking warfarin and comes in today for a routine follow up visit.  Positive risk factors for bleeding include an age of 47 years or older.  Negative risk factors for bleeding include no history of CVA/TIA.  The bleeding index is 'intermediate risk'.  Positive CHADS2 values include Age > 58 years old.  Negative CHADS2 values include History of HTN, History of Diabetes, and Prior Stroke/CVA/TIA.  The start date was 04/24/2001.  Her last INR was 1.8.  Anticoagulation responsible provider: Shirlee Latch MD, Sherae Santino.  INR POC: 3.2.  Cuvette Lot#: [id number].  Exp: 11/2010.    Anticoagulation Management Assessment/Plan:      The patient's current anticoagulation dose is Warfarin sodium 5 mg tabs: Use as directed by Anticoagulation Clinic.  The target INR is 2 - 3.  The next INR is due 11/22/2009.  Anticoagulation instructions were given to patient.  Results were reviewed/authorized by Weston Brass, PharmD.  She was notified by Kennieth Francois.         Prior Anticoagulation Instructions: INR 3.1  Take 1 tablet (5mg ) tonight.  Then resume taking 1 tablet (5mg ) every day except take 1.5 tablets (7.5mg ) on Thursdays.  Recheck in 4  weeks.    Current Anticoagulation Instructions: INR 3.2  Take one-half tablet today (9/15), then begin new dose of one tablet every day.  Return to clinic in 3 weeks.

## 2010-03-19 NOTE — Letter (Signed)
Summary: Patient Does Not Want Appt/[facility]  Patient Does Not Want Appt/[facility]   Imported By: Lanelle Bal 05/25/2009 13:52:11  _____________________________________________________________________  External Attachment:    Type:   Image     Comment:   External Document

## 2010-03-19 NOTE — Assessment & Plan Note (Signed)
Summary: follow up colon/sheri   History of Present Illness Visit Type: Follow-up Visit Primary GI MD: Elie Goody MD [facility] Primary Provider: Marga Melnick, MD Chief Complaint: Patient complains of diarrhea, she is having about 4 BMs a day at this point. She is having some BRB which she related to her hemrrhoids and the number of stools she is having throughout the day. She denies any abdominal pain.  History of Present Illness:   Michele [name] returns for followup of persistent diarrhea. She states she is generally having 4-5 loose stools a daily. She has had small amounts of bright red blood per rectum. A recent colonoscopy was unremarkable except for a small adenomatous colon polyp, diverticulosis and internal hemorrhoids. Random biopsies were negative.  She has ongoing problems with right lower quadrant pain that is intermittent and does not appear to be related to bowel movements or meals. She was evaluated by Dr. Nicholas Lose with no GYN cause uncovered   GI Review of Systems      Denies abdominal pain, acid reflux, belching, bloating, chest pain, dysphagia with liquids, dysphagia with solids, heartburn, loss of appetite, nausea, vomiting, vomiting blood, weight loss, and  weight gain.      Reports diarrhea, hemorrhoids, and  rectal bleeding.     Denies anal fissure, black tarry stools, change in bowel habit, constipation, diverticulosis, fecal incontinence, heme positive stool, irritable bowel syndrome, jaundice, light color stool, liver problems, and  rectal pain.   Current Medications (verified): 1)  Multivitamins  Tabs (Multiple Vitamin) .... Take 1 Tablet By Mouth Once A Day 2)  Synthroid 125 Mcg  Tabs (Levothyroxine Sodium) .... 1/2 Pill On  Mon,weds, Fri & Sun ; 1 Pill T,th,sat 3)  Vitamin D3 1000 Unit  Caps (Cholecalciferol) .Marland Kitchen.. 1 By Mouth Once Daily 4)  Calcium Carbonate 600 Mg Tabs (Calcium Carbonate) .... Take 1 Tablet Two Times A Day 5)  Coenzyme Q10 200 Mg Caps (Coenzyme Q10)  .... Take 1 Tablet By Mouth Once A Day 6)  Salmon Oil  Caps (Nutritional Supplements) .... 1000mg  Daily 7)  Warfarin Sodium 5 Mg Tabs (Warfarin Sodium) .... Use As Directed By Anticoagulation Clinic 8)  Biotin 1000 Mcg Tabs (Biotin) .Marland Kitchen.. 1 By Mouth Once Daily 9)  Florastor 250 Mg Caps (Saccharomyces Boulardii) .Marland Kitchen.. 1 By Mouth Once Daily 10)  Furosemide 20 Mg Tabs (Furosemide) .... Take One Tablet By Mouth Every Other Day  Allergies (verified): 1)  ! Colchicine 2)  Lamisil (Terbinafine Hcl) 3)  Erythromycin  Past History:  Past Medical History: cardiomyopathy-rate related. Status post AV junction ablation Status post pacemaker with CRT-D. grade([employer]) Hypothyroidism Thyroid nodules (goiter) 1990 Colonic Polyposis/negative colon 09/2000 TUBULAR ADENOMA 06/19/2009 diverticulosis C.diff 11/2006 Atrial fibrillation-neg stress cardilite 04/30/03 Gilbert's Syndrome 09/2003 Diverticulitis, hx of 2010  Past Surgical History: thyroid bx 1990 , Dr Juleen China, Endocrinologist; single nodule Throidectomy (12/08) Appendectomy (1956) Cholecystectomy (6644) Hysterectomy (1973) Pacemaker Placement (9/08) ICD 1/10 Breast Biopsy (1997) Rotator cuff (x2) Colonscopy 2002:minute polyps resected, 2011  Family History: Reviewed history from 03/20/2009 and no changes required. Father:  Mother: MI died 47 Siblings: bro goiter,MI died age 64 sister-bronchitis No FH of Colon Cancer: Family History of Colitis/Crohn's: Son-UC  Social History: Reviewed history from 03/20/2009 and no changes required. She and her husband moved to the [place] from [place] over 25 years ago. He was with [employer]. Patient has never smoked.  Alcohol Use - no Illicit Drug Use - no  Review of Systems  The pertinent positives and negatives are noted as above and in the HPI. All other ROS were reviewed and were negative.   Vital Signs:  Patient profile:   77 year old female Height:      63  inches Weight:      170.6 pounds BMI:     30.33 Pulse rate:   76 / minute Pulse rhythm:   regular BP sitting:   100 / 62  (left arm) Cuff size:   regular  Vitals Entered By: Michele [name] CMA Duncan Dull) (August 08, 2009 10:11 AM)   Physical Exam  General:  Well developed, well nourished, no acute distress. Head:  Normocephalic and atraumatic. Eyes:  PERRLA, no icterus. Mouth:  No deformity or lesions, dentition normal. Lungs:  Clear throughout to auscultation. Heart:  Regular rate and rhythm; no murmurs, rubs,  or bruits. Abdomen:  Soft, nontender and nondistended. No masses, hepatosplenomegaly or hernias noted. Normal bowel sounds. Psych:  Alert and cooperative. Normal mood and affect.  Impression & Recommendations:  Problem # 1:  DIARRHEA (ICD-787.91) Persistent diarrhea without a clear etiology. I suspect this is a post infectious diarrhea related to her prior C. difficile. She noted a flare of diarrhea, and colchicine as well. She is advised to avoid foods that worsen her symptoms. Obtain a fecal pancreatic elastase and schedule an abdominal pelvic CT scan. Begin Imodium q. day p.r.n. 4 ongoing management of diarrhea Orders: TLB-BUN (Urea Nitrogen) (84520-[id number]) TLB-Creatinine, Blood (82565-CREA) T- * Misc. Laboratory test [phone]) CT Abdomen/Pelvis with Contrast (CT Abd/Pelvis w/con)  Problem # 2:  ABDOMINAL PAIN-RLQ (ICD-789.03) Plans as above.  Problem # 3:  HEMORRHOIDS-INTERNAL (ICD-455.0) Small internal hemorrhoids with intermittent hematochezia. Hopefully better control of her diarrhea will minimize her hemorrhoidal symptoms. If the symptoms persist, she will contact us for additional recommendations.  Problem # 4:  COUMADIN THERAPY (ICD-V58.61)  Problem # 5:  PERSONAL HX COLONIC POLYPS (ICD-V12.72)  Patient Instructions: 1)  Please go to the basement floor for labwork before leaving today. You will be having a BUN, Creatinine and Pancreatic Elastace 1. 2)  Please take  Imodium daily as needed for your diarrhea. 3)  You have been scheduled for a CT abdomen and pelvis at [facility] [id number]. Please arrive at 8:15 for your 8:30 am appointment. 4)  Copy sent to : Dr Marga Melnick 5)  The medication list was reviewed and reconciled.  All changed / newly prescribed medications were explained.  A complete medication list was provided to the patient / caregiver.  Appended Document: follow up colon/sheri Patient notified of laboratory results

## 2010-03-19 NOTE — Medication Information (Signed)
Summary: rov/cs  Anticoagulant Therapy  Managed by: Weston Brass, PharmD Referring MD: Valera Castle MD PCP: Marga Melnick, MD Supervising MD: Johney Frame MD, Fayrene Fearing Indication 1: Atrial Fibrillation (ICD-427.31) Lab Used: [facility] Site: [facility] INR POC 2.4 INR RANGE 2 - 3  Dietary changes: no    Health status changes: no    Bleeding/hemorrhagic complications: no    Recent/future hospitalizations: no    Any changes in medication regimen? yes       Details: Vytorin 10/20mg  once daily -first took for 2 days, and stopped after feeling dizzy. started taking again Oct 1st, 2011 without any returning signs of dizziness so far.   Recent/future dental: no  Any missed doses?: no       Is patient compliant with meds? yes       Allergies: 1)  ! Colchicine 2)  ! Crestor (Rosuvastatin Calcium) 3)  Lamisil (Terbinafine Hcl) 4)  Erythromycin  Anticoagulation Management History:      The patient is taking warfarin and comes in today for a routine follow up visit.  Positive risk factors for bleeding include an age of 75 years or older.  Negative risk factors for bleeding include no history of CVA/TIA.  The bleeding index is 'intermediate risk'.  Positive CHADS2 values include Age > 68 years old.  Negative CHADS2 values include History of HTN, History of Diabetes, and Prior Stroke/CVA/TIA.  The start date was 04/24/2001.  Her last INR was 1.8.  Anticoagulation responsible provider: Aliviana Burdell MD, Fayrene Fearing.  INR POC: 2.4.  Cuvette Lot#: [id number].  Exp: 12/2010.    Anticoagulation Management Assessment/Plan:      The patient's current anticoagulation dose is Warfarin sodium 5 mg tabs: Use as directed by Anticoagulation Clinic.  The target INR is 2 - 3.  The next INR is due 12/20/2009.  Anticoagulation instructions were given to patient.  Results were reviewed/authorized by Weston Brass, PharmD.  She was notified by Haynes Hoehn, PharmD Candidate.         Prior Anticoagulation Instructions: INR  3.2  Take one-half tablet today (9/15), then begin new dose of one tablet every day.  Return to clinic in 3 weeks.  Current Anticoagulation Instructions: INR 2.4  Continue Coumadin as scheduled:  1 tablet (5mg ) every day of the week.  Return to clinic in 4 weeks.

## 2010-03-19 NOTE — Cardiovascular Report (Signed)
Summary: Office Visit Remote   Office Visit Remote   Imported By: Roderic Ovens 11/29/2009 11:05:45  _____________________________________________________________________  External Attachment:    Type:   Image     Comment:   External Document

## 2010-03-19 NOTE — Procedures (Signed)
Summary: [employer]il [employer]   Imported By: Sherian Rein 08/09/2009 07:16:26  _____________________________________________________________________  External Attachment:    Type:   Image     Comment:   External Document

## 2010-03-19 NOTE — Assessment & Plan Note (Signed)
Summary: 2 month hx diarrhea/hx of c-diff/sheri   History of Present Illness Visit Type: Follow-up Visit Primary GI MD: Elie Goody MD [facility] Primary Provider: Marga Melnick, MD Chief Complaint: diarrhea x 2 months, no blood or mucous that pt has seen History of Present Illness:   77 YO FEMALE KOWN TO DR. [name] . LAST SEEN BY GI 10/08 WHEN SHE HAD C.DIFF. SHE WAS TREATED WITH VANCOMYCIN WITH GOOD SUCESS. SHE HAD DEVELOPED THE DIARRHEA AFTER BEING TREATED WITH CIPRO/FLAGYL FOR DIVERTICULITIS. LAST COLONOSCOPY 8/02 WITH INTERNAL HEMORRHOIDS/DIVERTICULOSIS.   SHE COMES IN TODAY WITH C/O DIARRHEA X 2 MONTHS. NO ANTIBIOTICS FOR A YEAR. SHE IS HAVING 6-7 PUDDING LIKE STOOLS PER DAY, OCCASIONAL NOCTURNAL EPISODE. NO PAIN,CRAMPING, FEVER,NAUSEA ETC. SHE HAS BEEN ON A VERY BLAND DIET THIS PAST WEEK WHICH HAS HELPED A LITTLE. NO NEW MEDS,EXPOSURES ETC.SHE IS ON FLORASTOR,DR. HOPPER CALLED IN [place] BUT NOT TAKING AS YET. SHE IS ON COUMADIN-HAS A CARDIOMYOPATHY ,HAD PERICARDITIS IN 8/10, S/P DEFIBRILLATOR LAST YEAR.   GI Review of Systems    Reports bloating.      Denies abdominal pain, acid reflux, belching, chest pain, dysphagia with liquids, dysphagia with solids, heartburn, loss of appetite, nausea, vomiting, vomiting blood, weight loss, and  weight gain.      Reports diarrhea and  fecal incontinence.     Denies anal fissure, black tarry stools, change in bowel habit, constipation, diverticulosis, heme positive stool, hemorrhoids, irritable bowel syndrome, jaundice, light color stool, liver problems, rectal bleeding, and  rectal pain.     Current Medications (verified): 1)  Multivitamins  Tabs (Multiple Vitamin) .... Take 1 Tablet By Mouth Once A Day 2)  Synthroid 125 Mcg  Tabs (Levothyroxine Sodium) .... 1/2 Pill On  Mon,weds, Fri & Sun ; 1 Pill T,th,sat 3)  Vitamin D3 1000 Unit  Caps (Cholecalciferol) .Marland Kitchen.. 1 By Mouth Once Daily 4)  Calcium Carbonate 600 Mg Tabs (Calcium Carbonate) .... Take 1  Tablet Two Times A Day 5)  Coenzyme Q10 200 Mg Caps (Coenzyme Q10) .... Take 1 Tablet By Mouth Once A Day 6)  Salmon Oil  Caps (Nutritional Supplements) .... 1000mg  Daily 7)  Warfarin Sodium 5 Mg Tabs (Warfarin Sodium) .... Use As Directed By Anticoagulation Clinic 8)  Biotin 1000 Mcg Tabs (Biotin) .Marland Kitchen.. 1 By Mouth Once Daily 9)  Florastor 250 Mg Caps (Saccharomyces Boulardii) .Marland Kitchen.. 1 By Mouth Once Daily 10)  Creon 6000 Unit Cpep (Pancrelipase (Lip-Prot-Amyl)) .Marland Kitchen.. 1 Three Times A Day With Meals (Not Taking)  Allergies: 1)  ! Colchicine (Colchicine) 2)  Lamisil (Terbinafine Hcl) 3)  Erythromycin  Past History:  Past Medical History: cardiomyopathy-rate related. Status post AV junction ablation Status post pacemaker with CRT-D. grade([employer]tr[employer]) Hypothyroidism 1990-Thyroid nodules (goiter) Colonic Polyposis/negative colon 8/02 diverticulosis c.diff 10/08 Atrial fibrillation-neg stress cardilite 04/30/03 8/05-Gilbert's Syndrome Diverticulitis, hx of 2010  Past Surgical History: thyroid bx 1990 , Dr Juleen China, Endocrinologist; single nodule Throidectomy (12/08) Appendectomy (4782) Cholecystectomy (9562) Hysterectomy (1973) Pacemaker Placement (9/08) ICD 1/10 Breast Biopsy (1997) Rotator cuff (x2) Colonscopy 2002:minute polyps resected  Family History: Father:  Mother: MI died 16 Siblings: bro goiter,MI died age 11 sister-bronchitis No FH of Colon Cancer: Family History of Colitis/Crohn's: Son-UC  Social History: She and her husband moved to the [place] from [place] over 25 years ago. He was with [employer]. Patient has never smoked.  Alcohol Use - no Illicit Drug Use - no  Review of Systems  The patient denies allergy/sinus, anemia, anxiety-new, arthritis/joint pain, back  pain, blood in urine, breast changes/lumps, change in vision, confusion, cough, coughing up blood, depression-new, fainting, fatigue, fever, headaches-new, hearing problems, heart murmur, heart  rhythm changes, itching, menstrual pain, night sweats, nosebleeds, shortness of breath, skin rash, sleeping problems, sore throat, swelling of feet/legs, swollen lymph glands, thirst - excessive, urination - excessive, urination changes/pain, urine leakage, vision changes, and voice change.         ROS  OTHERWISE AS IN HPI  Vital Signs:  Patient profile:   77 year old female Height:      63 inches Weight:      173 pounds BMI:     30.76 Pulse rate:   76 / minute Pulse rhythm:   regular BP sitting:   114 / 70  (left arm) Cuff size:   regular  Vitals Entered By: Francee Piccolo CMA Duncan Dull) (March 20, 2009 10:32 AM)  Physical Exam  General:  Well developed, well nourished, no acute distress. Head:  Normocephalic and atraumatic. Eyes:  PERRLA, no icterus. Lungs:  Clear throughout to auscultation. Heart:  Regular rate and rhythm; no murmurs, rubs,  or bruits.PACER/ICD DOWN INTO UPPER BREAST Abdomen:  SOFT, NONTENDER, NO MASS OR HSM,BS+ Rectal:  NOT DONE Extremities:  No clubbing, cyanosis, edema or deformities noted. Neurologic:  Alert and  oriented x4;  grossly normal neurologically. Psych:  Alert and cooperative. Normal mood and affect.   Impression & Recommendations:  Problem # 1:  DIARRHEA (ICD-787.91) Assessment New 77 YO FEMALE WITH 2 MONTH HX OF DIARRHEA,HXOF C.DIFF 10/08. R/O RECURRENT C.DIFF, R/O UNDERLYING MICROSCOPIC COLITIS ETC.   PT IS AT INCREASED RISK WITH CARDIOMYOPATHY,ICD AND COUMADIN RX- STOOL CULTURES TODAY INCREASE FLORASTOR TO TWICE DAILY X ONE MONTH EMPIRIC FLAGYL 250 MG 4 X DAILY X 14 DAYS-IF SXS DO NOT RESOLVE THEN WILL PROCEED WITH COLONOSCOPY WITH DR Guinevere Ferrari ICD DE-ACTIVATED,AND PROBABLY COME OFF COUMADIN. BLAND DIET Orders: T-Culture, Stool ([id number]) T-Culture, C-Diff Toxin A/B [id number]) T-Stool Giardia / Crypto- EIA (09811) T-Stool for O&P ([id number]) T-Fecal WBC ([id number])  Problem # 2:  PERSONAL HX COLONIC  POLYPS (ICD-V12.72) Assessment: Comment Only LAST COLON 8/02,NO POLYPS DUE FOR FOLLOW UP 2012  Problem # 3:  COUMADIN THERAPY (ICD-V58.61) Assessment: Comment Only  Problem # 4:  CORONARY ARTERY DISEASE (ICD-414.00) Assessment: Comment Only CARDIOMYOPATHY,S/P PACER AND ICD  Patient Instructions: 1)  Your physician has requested that you have the following labwork done today: Please go to basement level. 2)  Perscription [facility] to [facility] , [address] phone number [phone].  Medications: Florastor and Flagyl. 3)  Call us in 10 days if not better and we may have to plan a Colonoscopy with Dr. [name]. 4)  Copy sent to : Marga Melnick, MD 5)  The medication list was reviewed and reconciled.  All changed / newly prescribed medications were explained.  A complete medication list was provided to the patient / caregiver. Prescriptions: FLORASTOR 250 MG CAPS (SACCHAROMYCES BOULARDII) Take 1 tab twice daily  #60 x 1   Entered by:   Lowry Ram NCMA   Authorized by:   Sammuel Cooper PA-c   Signed by:   Lowry Ram NCMA on 03/20/2009   Method used:   Electronically to        [facility] [phone]* (retail)       [address] [place], [place]  [address]       Ph: [phone]  Fax: [phone]   RxID:   [id number] FLAGYL 500 MG TABS (METRONIDAZOLE) Take 1/2 tab 4 times daily  #28 x 0   Entered by:   Lowry Ram NCMA   Authorized by:   Sammuel Cooper PA-c   Signed by:   Lowry Ram NCMA on 03/20/2009   Method used:   Electronically to        [facility] # [phone]* (retail)       [address] [place], [place]  [address]       Ph: [phone]       Fax: [phone]   RxID:   [id number]

## 2010-03-19 NOTE — Letter (Signed)
Summary: Beather Arbour MD  Beather Arbour MD   Imported By: Lanelle Bal 05/30/2009 10:22:47  _____________________________________________________________________  External Attachment:    Type:   Image     Comment:   External Document

## 2010-03-19 NOTE — Progress Notes (Signed)
Summary: ? re meds  Phone Note Call from Patient Call back at Home Phone [phone]   Caller: Patient Call For: [name] Reason for Call: Talk to Nurse Summary of Call: Patient was seen yesterday for a pv before col on Monday but states that she went to Doc today and was given anibiotics, wants to know if it's ok to take them Initial call taken by: Tawni Levy,  May 02, 2009 1:35 PM  Follow-up for Phone Call        Pt. cx'd her procedure because her husband was having angina and she wants to wait until after he's been taken care of. Follow-up by: Wyona Almas RN,  May 02, 2009 3:01 PM

## 2010-03-19 NOTE — Medication Information (Signed)
Summary: rov/mw  Anticoagulant Therapy  Managed by: Bethena Midget, RN, BSN Referring MD: Valera Castle MD PCP: Marga Melnick, MD Supervising MD: Eden Emms MD, Theron Arista Indication 1: Atrial Fibrillation (ICD-427.31) Lab Used: [facility] Site: [facility] INR POC 3.3 INR RANGE 2 - 3  Dietary changes: no    Health status changes: yes       Details: C-Diff still having diarrhea  Bleeding/hemorrhagic complications: no    Recent/future hospitalizations: no    Any changes in medication regimen? yes       Details: On Vancomycin 1 tab daily at present, tapering dose for 5 weeks, has 10 days left.   Recent/future dental: no  Any missed doses?: no       Is patient compliant with meds? yes      Comments: Seeing Dr Graciela Husbands today   Allergies: 1)  ! Colchicine 2)  ! Crestor (Rosuvastatin Calcium) 3)  ! Keflex 4)  Lamisil (Terbinafine Hcl) 5)  Erythromycin  Anticoagulation Management History:      The patient is taking warfarin and comes in today for a routine follow up visit.  Positive risk factors for bleeding include an age of 75 years or older.  Negative risk factors for bleeding include no history of CVA/TIA.  The bleeding index is 'intermediate risk'.  Positive CHADS2 values include Age > 75 years old.  Negative CHADS2 values include History of HTN, History of Diabetes, and Prior Stroke/CVA/TIA.  The start date was 04/24/2001.  Her last INR was 1.8.  Anticoagulation responsible provider: Eden Emms MD, Theron Arista.  INR POC: 3.3.  Cuvette Lot#: [id number].  Exp: 11/2010.    Anticoagulation Management Assessment/Plan:      The patient's current anticoagulation dose is Warfarin sodium 5 mg tabs: Use as directed by Anticoagulation Clinic.  The target INR is 2 - 3.  The next INR is due 02/12/2010.  Anticoagulation instructions were given to patient.  Results were reviewed/authorized by Bethena Midget, RN, BSN.  She was notified by Bethena Midget, RN, BSN.         Prior Anticoagulation Instructions: INR  2.8 Continue taking 1 tablet everyday. Recheck in 4 weeks.   Current Anticoagulation Instructions: INR 3.3 Skip today's dose then resume 5mg s everyday. Recheck in 3 weeks.

## 2010-03-19 NOTE — Medication Information (Signed)
Summary: rov/eac  Anticoagulant Therapy  Managed by: Bethena Midget, RN, BSN Referring MD: Valera Castle MD PCP: Marga Melnick, MD Supervising MD: Ladona Ridgel MD, Sharlot Gowda Indication 1: Atrial Fibrillation (ICD-427.31) Lab Used: [facility] Site: [facility] INR POC 2.3 INR RANGE 2 - 3  Dietary changes: no    Health status changes: no    Bleeding/hemorrhagic complications: no    Recent/future hospitalizations: no    Any changes in medication regimen? no    Recent/future dental: no  Any missed doses?: no       Is patient compliant with meds? yes       Allergies: 1)  ! Colchicine 2)  Lamisil (Terbinafine Hcl) 3)  Erythromycin  Anticoagulation Management History:      The patient is taking warfarin and comes in today for a routine follow up visit.  Positive risk factors for bleeding include an age of 75 years or older.  Negative risk factors for bleeding include no history of CVA/TIA.  The bleeding index is 'intermediate risk'.  Positive CHADS2 values include Age > 49 years old.  Negative CHADS2 values include History of HTN, History of Diabetes, and Prior Stroke/CVA/TIA.  The start date was 04/24/2001.  Her last INR was 1.8.  Anticoagulation responsible provider: Ladona Ridgel MD, Sharlot Gowda.  INR POC: 2.3.  Cuvette Lot#: [id number].  Exp: 09/2010.    Anticoagulation Management Assessment/Plan:      The patient's current anticoagulation dose is Warfarin sodium 5 mg tabs: Use as directed by Anticoagulation Clinic.  The target INR is 2 - 3.  The next INR is due 08/14/2009.  Anticoagulation instructions were given to patient.  Results were reviewed/authorized by Bethena Midget, RN, BSN.  She was notified by Bethena Midget, RN, BSN.         Prior Anticoagulation Instructions: INR 2.3  Continue taking 1.5 tablets on Thursday, and 1 tablet all other days.  Return to clinic in 3 weeks.    Current Anticoagulation Instructions: INR 2.3 Continue 5mg s daily except 7.5mg s on Thursdays. Recheck in 4 weeks.

## 2010-03-19 NOTE — Assessment & Plan Note (Signed)
Summary: flu vac/cbs  Nurse Visit   Allergies: 1)  ! Colchicine 2)  ! Crestor (Rosuvastatin Calcium) 3)  Lamisil (Terbinafine Hcl) 4)  Erythromycin  Orders Added: 1)  Flu Vaccine 58yrs + MEDICARE PATIENTS [Q2039] 2)  Administration Flu vaccine - MCR [G0008] Flu Vaccine Consent Questions     Do you have a history of severe allergic reactions to this vaccine? no    Any prior history of allergic reactions to egg and/or gelatin? no    Do you have a sensitivity to the preservative Thimersol? no    Do you have a past history of Guillan-Barre Syndrome? no    Do you currently have an acute febrile illness? no    Have you ever had a severe reaction to latex? no    Vaccine information given and explained to patient? yes    Are you currently pregnant? no    Lot Number:[id number]   Exp Date:08/17/2010   Site Given  Right Deltoid IMistration Flu vaccine - MCR [G0008] .lbmedflu

## 2010-03-19 NOTE — Medication Information (Signed)
Summary: Michele [name]  Anticoagulant Therapy  Managed by: Cloyde Reams, RN, BSN Referring MD: Valera Castle MD PCP: Marga Melnick, MD Supervising MD: Tenny Craw MD, Gunnar Fusi Indication 1: Atrial Fibrillation (ICD-427.31) Lab Used: [facility] Site: [facility] INR POC 3.7 INR RANGE 2 - 3  Dietary changes: no    Health status changes: no    Bleeding/hemorrhagic complications: no    Recent/future hospitalizations: no    Any changes in medication regimen? yes       Details: Completing Flagyl on Monday.    Recent/future dental: no  Any missed doses?: no       Is patient compliant with meds? yes       Allergies: 1)  ! Colchicine (Colchicine) 2)  Lamisil (Terbinafine Hcl) 3)  Erythromycin  Anticoagulation Management History:      The patient is taking warfarin and comes in today for a routine follow up visit.  Positive risk factors for bleeding include an age of 57 years or older.  Negative risk factors for bleeding include no history of CVA/TIA.  The bleeding index is 'intermediate risk'.  Positive CHADS2 values include Age > 15 years old.  Negative CHADS2 values include History of HTN, History of Diabetes, and Prior Stroke/CVA/TIA.  The start date was 04/24/2001.  Her last INR was 1.8.  Anticoagulation responsible provider: Tenny Craw MD, Gunnar Fusi.  INR POC: 3.7.  Cuvette Lot#: [id number].  Exp: 05/2010.    Anticoagulation Management Assessment/Plan:      The patient's current anticoagulation dose is Warfarin sodium 5 mg tabs: Use as directed by Anticoagulation Clinic.  The target INR is 2 - 3.  The next INR is due 04/09/2009.  Anticoagulation instructions were given to patient.  Results were reviewed/authorized by Cloyde Reams, RN, BSN.  She was notified by Cloyde Reams RN.         Prior Anticoagulation Instructions: INR 2.9 While on Flaygl take 2.5mg s on Monday and Friday and all other days normal dose. Recheck in one week.   Current Anticoagulation Instructions: INR 3.7  Skip tonight's dose of  coumadin then, 1 tablet on Sat and Sun then 1/2 tablet on Monday then resume same dosage 1 tablet daily except 1.5 tablets on Thursdays.

## 2010-03-19 NOTE — Progress Notes (Signed)
Summary: Triage  Phone Note Call from Patient Call back at Home Phone [phone]   Caller: Patient Call For: Dr. [name] Reason for Call: Talk to Nurse Summary of Call: pt has an appt. on 6.22.11 and requesting a sooner appt. She is having severe diarrhea Initial call taken by: Karna Christmas,  August 02, 2009 11:26 AM  Follow-up for Phone Call        Talked with pt.  States diarrhea is getting worse.  At times has no control.  Feel she needs to be seen before 08/08/09. Appt sch with Amy Esterwood,PA. Follow-up by: Ashok Cordia RN,  August 02, 2009 11:44 AM

## 2010-03-19 NOTE — Progress Notes (Signed)
Summary: Med clarification  Phone Note Outgoing Call   Call placed by: Sherri Rad, RN, BSN,  March 09, 2009 2:50 PM Call placed to: Patient Summary of Call: Our office recieved a fax today from the insurance requesting that we switch the pt from brand Protonix to generic Pantoprazole. This medication is not listed in the pt's chart. I called and verified with the pt that she is no longer taking protonix.  Initial call taken by: Sherri Rad, RN, BSN,  March 09, 2009 2:52 PM

## 2010-03-19 NOTE — Progress Notes (Signed)
Summary: Phone-refill  Phone Note Refill Request Message from:  Fax from Pharmacy [address]  fax [phone]  Refills Requested: Medication #1:  PANCREATIN 325 MG TABS 1 with each meal. This item is otc -we do not stock -does MD know where she can get it? Thanks [facility]  Initial call taken by: Barb Merino,  March 06, 2009 2:48 PM Caller: Patient  Follow-up for Phone Call        Per Dr.Hopper patient can check around to the different pharmacies and see who has it in stock. If patient needs [place] to send a rx we can but if its OTC she can just get it without rx. Patient ok'd. Follow-up by: Shonna Chock,  March 06, 2009 4:49 PM

## 2010-03-19 NOTE — Assessment & Plan Note (Signed)
Summary: diarrhea/sheri   History of Present Illness Visit Type: Follow-up Visit Primary GI MD: Elie Goody MD [facility] Primary Provider: Marga Melnick, MD Chief Complaint: Chronic diarrhea x 1 year History of Present Illness:   Michele [name] returns today with her husband. She has recently been treated for diverticulitis with Cipro and Flagyl. She had abdominal CT scans on October 14 of October 19. I reviewed the images which initially showed mild sigmoid colon diverticulitis, which was resolving on the followup scan. I have the information from her emergency room visit. Her left lower quadrant pain has resolved, but now she is having 8-10 small volume, nonbloody bowel movements each day. Her typical bowel pattern was 3-4 stools daily.   GI Review of Systems    Reports bloating and  loss of appetite.      Denies abdominal pain, acid reflux, belching, chest pain, dysphagia with liquids, dysphagia with solids, heartburn, nausea, vomiting, vomiting blood, weight loss, and  weight gain.      Reports diarrhea and  diverticulosis.     Denies anal fissure, black tarry stools, change in bowel habit, constipation, fecal incontinence, heme positive stool, hemorrhoids, irritable bowel syndrome, jaundice, light color stool, liver problems, rectal bleeding, and  rectal pain. Current Medications (verified): 1)  Multivitamins  Tabs (Multiple Vitamin) .... Take 1 Tablet By Mouth Once A Day 2)  Synthroid 125 Mcg  Tabs (Levothyroxine Sodium) .... 1/2 Pill On  Mon,weds, Fri & Sun ; 1 Pill T,th,sat 3)  Vitamin D3 1000 Unit  Caps (Cholecalciferol) .Marland Kitchen.. 1 By Mouth Once Daily 4)  Calcium Carbonate 600 Mg Tabs (Calcium Carbonate) .... Take 1 Tablet Two Times A Day 5)  Coenzyme Q10 200 Mg Caps (Coenzyme Q10) .... Take 1 Tablet By Mouth Once A Day 6)  Salmon Oil  Caps (Nutritional Supplements) .... 1000mg  Daily 7)  Warfarin Sodium 5 Mg Tabs (Warfarin Sodium) .... Use As Directed By Anticoagulation Clinic 8)  Biotin  1000 Mcg Tabs (Biotin) .Marland Kitchen.. 1 By Mouth Once Daily 9)  Florastor 250 Mg Caps (Saccharomyces Boulardii) .Marland Kitchen.. 1 By Mouth Once Daily 10)  Furosemide 20 Mg Tabs (Furosemide) .... Take One Tablet By Mouth Every Other Day  Allergies: 1)  ! Colchicine 2)  ! Crestor (Rosuvastatin Calcium) 3)  ! 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 grade([employer]) Hypothyroidism Thyroid nodules (goiter) 1990 Colonic Polyposis/negative colon 09/2000 Tubular Adenomatous Colon polyp 06/2009 Diverticulosis/diverticulitis C.diff 11/2006 Atrial fibrillation-neg stress cardilite 04/30/03 Gilbert's Syndrome 09/2003 Diverticulitis, hx of 2010 Hyperlipidemia: NMR Lipoprofile 2005: LDL 234( 3206/2234), HDL 37, TG 86. LDL goal = < 100.[place] Study LDL goal = < 130.  Past Surgical History: thyroid bx 1990 , Dr Juleen China, Endocrinologist; single nodule Throidectomy (12/08) Appendectomy (1478) Cholecystectomy (2956) Hysterectomy (1973) Pacemaker Placement (9/08) ICD 1/10 Breast Biopsy (1997) Rotator cuff (x2) Colonscopy 2002:minute polyps resected, 2011  Family History: Reviewed history from 03/20/2009 and no changes required. Father:  Mother: MI died 43 Siblings: bro goiter,MI died age 67 sister-bronchitis No FH of Colon Cancer: Family History of Colitis/Crohn's: Son-UC  Social History: Reviewed history from 03/20/2009 and no changes required. She and her husband moved to the [place] from [place] over 25 years ago. He was with [employer]. Patient has never smoked.  Alcohol Use - no Illicit Drug Use - no  Review of Systems       The patient complains  of urination changes/pain.  The patient denies allergy/sinus, anemia, anxiety-new, arthritis/joint pain, back pain, blood in urine, breast changes/lumps, change in vision, confusion, cough, coughing up  blood, depression-new, fainting, fatigue, fever, headaches-new, hearing problems, heart murmur, heart rhythm changes, itching, menstrual pain, muscle pains/cramps, night sweats, nosebleeds, pregnancy symptoms, shortness of breath, skin rash, sleeping problems, sore throat, swelling of feet/legs, swollen lymph glands, thirst - excessive , urination - excessive , urine leakage, vision changes, and voice change.    Vital Signs:  Patient profile:   77 year old female Height:      63 inches Weight:      169 pounds BMI:     30.05 Pulse rate:   60 / minute Pulse rhythm:   regular BP sitting:   110 / 62  (left arm) Cuff size:   regular  Vitals Entered By: June McMurray CMA Duncan Dull) (December 19, 2009 11:08 AM)  Physical Exam  General:  Well developed, well nourished, no acute distress. Head:  Normocephalic and atraumatic. Eyes:  PERRLA, no icterus. Mouth:  No deformity or lesions, dentition normal. Lungs:  Clear throughout to auscultation. Heart:  Regular rate and rhythm; no murmurs, rubs,  or bruits. Abdomen:  Soft, nontender and nondistended. No masses, hepatosplenomegaly or hernias noted. Normal bowel sounds. Neurologic:  Alert and  oriented x4;  grossly normal neurologically. Psych:  Alert and cooperative. Normal mood and affect.  Impression & Recommendations:  Problem # 1:  DIARRHEA (ICD-787.91) Worsening diarrhea, status post a course of antibiotics. Rule out C. difficile diarrhea. She has a chronic baseline diarrhea, which is likely functional. Prior extensive evaluation for her chronic diarrhea has been negative. Obtain a fecal elastase. Orders: T- * Misc. Laboratory test [phone]) T- * Misc. Laboratory test [id number]) TLB-TSH (Thyroid Stimulating Hormone) (84443-TSH) T-Culture, Stool ([id number]) T-Stool Giardia / Crypto- EIA (14782) T-Stool for O&P ([id number]) T-Fecal WBC ([id number])  Problem # 2:  DIVERTICULITIS OF COLON (ICD-562.11) Recently treated diverticulitis,  clinically resolved.  Problem # 3:  PERSONAL HX COLONIC POLYPS (ICD-V12.72)  Problem # 4:  COUMADIN THERAPY (ICD-V58.61)  Patient Instructions: 1)  Get your labs and stool studies done today in our basement and we will call you with the results.  2)  Copy sent to : Marga Melnick, MD 3)  The medication list was reviewed and reconciled.  All changed / newly prescribed medications were explained.  A complete medication list was provided to the patient / caregiver.

## 2010-03-19 NOTE — Progress Notes (Signed)
Summary: Pancreatin (NOT Avaliable)  Phone Note Call from Patient Call back at Home Phone [phone]   Caller: Spouse Summary of Call: Patient's husband would like to let Dr.Hopper know that there is no pharmacy in [place] that carries PANCREATIN 325 MG, patient needs another med/alternative for her cramps.   Dr.Hopper please advise./[facility]  March 07, 2009 8:52 AM   Follow-up for Phone Call        Pharmacy said the only thing the could find similar would be a Rx med: Creon or patient could go to a health food store and ask if they have something similar.  Dr.Hopper please advise, would you like to RX Creon? or would you prefer patient check out a health food store. Please advise and forward to Triage Follow-up by: Shonna Chock,  March 07, 2009 11:18 AM  Additional Follow-up for Phone Call Additional follow up Details #1::        Creon generic 6000 three times a day with meals Additional Follow-up by: Marga Melnick MD,  March 07, 2009 12:35 PM    Additional Follow-up for Phone Call Additional follow up Details #2::    pt husband aware, rx sent to pharmacy will inform pt.Felecia Deloach CMA  March 07, 2009 12:39 PM   New/Updated Medications: CREON 6000 UNIT CPEP (PANCRELIPASE (LIP-PROT-AMYL)) 1 three times a day with meals Prescriptions: CREON 6000 UNIT CPEP (PANCRELIPASE (LIP-PROT-AMYL)) 1 three times a day with meals  #30 x 0   Entered by:   Jeremy Johann CMA   Authorized by:   Marga Melnick MD   Signed by:   Jeremy Johann CMA on 03/07/2009   Method used:   Faxed to ...       [facility]* (retail)       [address]       [place], [place]  [id number]       Ph: [phone]       Fax: [phone]   RxID:   [id number]

## 2010-03-19 NOTE — Procedures (Signed)
Summary: Hold on Medication For Procedure / [facility]  Hold on Medication For Procedure / [place]   Imported By: Lennie Odor 04/20/2009 16:25:01  _____________________________________________________________________  External Attachment:    Type:   Image     Comment:   External Document

## 2010-03-19 NOTE — Medication Information (Signed)
Summary: rov/cb  Anticoagulant Therapy  Managed by: Eda Keys, PharmD Referring MD: Valera Castle MD PCP: Marga Melnick, MD Supervising MD: Riley Kill MD, Maisie Fus Indication 1: Atrial Fibrillation (ICD-427.31) Lab Used: [facility]  Site: [facility] INR POC 2.3 INR RANGE 2 - 3  Dietary changes: no    Health status changes: no    Bleeding/hemorrhagic complications: no    Recent/future hospitalizations: no    Any changes in medication regimen? no    Recent/future dental: yes     Details: see note below regarding procedure  Any missed doses?: no       Is patient compliant with meds? yes      Comments: Colonoscopy this past 06/19/09, and pt held coumadin 5 days prior to procedure.  Resumed coumadin the night of procedure with 1.5 tablets x 2 days, then back to regular dosing schedule.    Allergies: 1)  ! Colchicine 2)  Lamisil (Terbinafine Hcl) 3)  Erythromycin  Anticoagulation Management History:      The patient is taking warfarin and comes in today for a routine follow up visit.  Positive risk factors for bleeding include an age of 75 years or older.  Negative risk factors for bleeding include no history of CVA/TIA.  The bleeding index is 'intermediate risk'.  Positive CHADS2 values include Age > 75 years old.  Negative CHADS2 values include History of HTN, History of Diabetes, and Prior Stroke/CVA/TIA.  The start date was 04/24/2001.  Her last INR was 1.8.  Anticoagulation responsible provider: Riley Kill MD, Maisie Fus.  INR POC: 2.3.  Cuvette Lot#: [id number].  Exp: 09/2010.    Anticoagulation Management Assessment/Plan:      The patient's current anticoagulation dose is Warfarin sodium 5 mg tabs: Use as directed by Anticoagulation Clinic.  The target INR is 2 - 3.  The next INR is due 07/17/2009.  Anticoagulation instructions were given to patient.  Results were reviewed/authorized by Eda Keys, PharmD.  She was notified by Eda Keys.         Prior Anticoagulation  Instructions: INR 2.5. Take 1 tablet daily except 1.5 tablets on Thursdays. Do not take Coumadin 4/28-5/3 for colonoscopy on 5/3.  When doctor tells you to restart Coumadin, take 1.5 tablets for 2 days, then take 1 tablet daily and 1.5 tablets on Thursdays.  Recheck 1 week after colonoscopy.  Current Anticoagulation Instructions: INR 2.3  Continue taking 1.5 tablets on Thursday, and 1 tablet all other days.  Return to clinic in 3 weeks.

## 2010-03-19 NOTE — Procedures (Signed)
Summary: Anticoagulation Modification Letter  [facility]  [address] [place], [place] [address]   Phone: [phone]  Fax: [phone]    April 06, 2009  Re:    Michele [name] Seney DOB:    [birth year] MRN:    [id number]    Dear Graciela Husbands:  We have scheduled the above patient for an endoscopic procedure. Our records show that  he/she is on anticoagulation therapy. Please advise as to how long the patient may come off their therapy of coumadin prior to the scheduled procedure(s) on 05/07/09.   Please fax back/or route the completed form to Darcey Nora, RN at [facility]  Thank you for your help with this matter.  Sincerely,  Darcey Nora RN, [facility]   Physician Recommendation:  Hold Plavix 7 days prior ________________  Hold Coumadin 5 days prior ____________  Other ______________________________     Appended Document: Anticoagulation Modification Letter Form completed and sent on 04/18/09.  OK to stop per Dr. Graciela Husbands  Appended Document: Anticoagulation Modification Letter Per Dr Graciela Husbands ok to hold coumadin x 5 days. Darcey Nora RN, [facility]  April 18, 2009 2:58 PM     Clinical Lists Changes

## 2010-03-19 NOTE — Medication Information (Signed)
Summary: rov/ewj  Anticoagulant Therapy  Managed by: Bethena Midget, RN Referring MD: Valera Castle MD PCP: Marga Melnick, MD Supervising MD: Johney Frame MD, Fayrene Fearing Indication 1: Atrial Fibrillation (ICD-427.31) Lab Used: [facility] Site: [facility] INR POC 2.9 INR RANGE 2 - 3  Dietary changes: no    Health status changes: yes       Details: Had diarrhea since before Christmas  Bleeding/hemorrhagic complications: no    Recent/future hospitalizations: no    Any changes in medication regimen? yes       Details: Flaygl 500mg s 1/2 tablet QID started 03/20/09 for 7 days.   Recent/future dental: yes     Details: Having Crown placed, no date set  Any missed doses?: no       Is patient compliant with meds? yes      Comments: On 03/20/09 only got in 2 doses.   Allergies: 1)  ! Colchicine (Colchicine) 2)  Lamisil (Terbinafine Hcl) 3)  Erythromycin  Anticoagulation Management History:      The patient is taking warfarin and comes in today for a routine follow up visit.  Positive risk factors for bleeding include an age of 32 years or older.  Negative risk factors for bleeding include no history of CVA/TIA.  The bleeding index is 'intermediate risk'.  Positive CHADS2 values include Age > 72 years old.  Negative CHADS2 values include History of HTN, History of Diabetes, and Prior Stroke/CVA/TIA.  The start date was 04/24/2001.  Her last INR was 1.8.  Anticoagulation responsible provider: Demetri Goshert MD, Fayrene Fearing.  INR POC: 2.9.  Cuvette Lot#: [id number].  Exp: 05/2010.    Anticoagulation Management Assessment/Plan:      The patient's current anticoagulation dose is Warfarin sodium 5 mg tabs: Use as directed by Anticoagulation Clinic.  The target INR is 2 - 3.  The next INR is due 03/30/2009.  Anticoagulation instructions were given to patient.  Results were reviewed/authorized by Bethena Midget, RN.  She was notified by Bethena Midget, RN, BSN.         Prior Anticoagulation Instructions: INR: 2.3 Continue with  same dosage of 5mg  tablet daily except 7.5mg  on Thursdays Recheck in 4 weeks  Current Anticoagulation Instructions: INR 2.9 While on Flaygl take 2.5mg s on Monday and Friday and all other days normal dose. Recheck in one week.   Appended Document: Coumadin Clinic    Anticoagulant Therapy  Managed by: Bethena Midget, RN Referring MD: Valera Castle MD PCP: Marga Melnick, MD Supervising MD: Johney Frame MD, Fayrene Fearing Indication 1: Atrial Fibrillation (ICD-427.31) Lab Used: [facility] Site: [facility] INR RANGE 2 - 3          Comments: Finishes Flaygl on 04/03/09  Allergies: 1)  ! Colchicine (Colchicine) 2)  Lamisil (Terbinafine Hcl) 3)  Erythromycin  Anticoagulation Management History:      Positive risk factors for bleeding include an age of 27 years or older.  Negative risk factors for bleeding include no history of CVA/TIA.  The bleeding index is 'intermediate risk'.  Positive CHADS2 values include Age > 38 years old.  Negative CHADS2 values include History of HTN, History of Diabetes, and Prior Stroke/CVA/TIA.  The start date was 04/24/2001.  Her last INR was 1.8.  Anticoagulation responsible provider: Veryl Winemiller MD, Fayrene Fearing.  Exp: 05/2010.    Anticoagulation Management Assessment/Plan:      The patient's current anticoagulation dose is Warfarin sodium 5 mg tabs: Use as directed by Anticoagulation Clinic.  The target INR is 2 - 3.  The next  INR is due 03/30/2009.  Anticoagulation instructions were given to patient.  Results were reviewed/authorized by Bethena Midget, RN.         Prior Anticoagulation Instructions: INR 2.9 While on Flaygl take 2.5mg s on Monday and Friday and all other days normal dose. Recheck in one week.

## 2010-03-19 NOTE — Assessment & Plan Note (Signed)
Summary: defib check.[email or web address]   Visit Type:  Follow-up Primary Provider:  Marga Melnick, MD  CC:  no cardiac complaints.  History of Present Illness: Michele [name] is seen in followup for atrial fibrillation s/p AV ablation with A. 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  She comes in today feeling really very well The patient denies SOB, chest pain, edema or palpitations  Major issue has been  chronic diarrrhea   Current Medications (verified): 1)  Multivitamins  Tabs (Multiple Vitamin) .... Take 1 Tablet By Mouth Once A Day 2)  Synthroid 125 Mcg  Tabs (Levothyroxine Sodium) .... 1/2 Pill On  Mon,weds, Fri & Sun ; 1 Pill T,th,sat 3)  Vitamin D3 1000 Unit  Caps (Cholecalciferol) .Marland Kitchen.. 1 By Mouth Once Daily 4)  Calcium Carbonate 600 Mg Tabs (Calcium Carbonate) .... Take 1 Tablet Two Times A Day 5)  Coenzyme Q10 200 Mg Caps (Coenzyme Q10) .... Take 1 Tablet By Mouth Once A Day 6)  Salmon Oil  Caps (Nutritional Supplements) .... 1000mg  Daily 7)  Warfarin Sodium 5 Mg Tabs (Warfarin Sodium) .... Use As Directed By Anticoagulation Clinic 8)  Biotin 1000 Mcg Tabs (Biotin) .Marland Kitchen.. 1 By Mouth Once Daily 9)  Florastor 250 Mg Caps (Saccharomyces Boulardii) .Marland Kitchen.. 1 By Mouth Once Daily 10)  Furosemide 20 Mg Tabs (Furosemide) .... Take One Tablet By Mouth Every Other Day 11)  Vancocin Hcl 125 Mg Caps (Vancomycin Hcl) .... Take 1 Cap Po Qid X 2 Weeks, Then To 1 Cap Po Bid X 1 Week,  Then To 1 Cap Po Qd X 1 Week, Then To 1 Cap By Mouth To Every Other Day X 1 Week Then D/c  Allergies: 1)  ! Colchicine 2)  ! Crestor (Rosuvastatin Calcium) 3)  ! Keflex 4)  Lamisil (Terbinafine Hcl) 5)  Erythromycin  Vital Signs:  Patient profile:   77 year old female Height:      63 inches Weight:      171 pounds BMI:     30.40 Pulse (ortho):   85 / minute Pulse rhythm:   regular BP sitting:   130 / 82  (right  arm) Cuff size:   regular  Vitals Entered By: Scherrie Bateman, LPN (January 22, 2010 2:21 PM)  Physical Exam  General:  The patient was alert and oriented in no acute distress. HEENT Normal.  Neck veins were flat, carotids were brisk.  Lungs were clear.  Heart sounds were regular without murmurs or gallops.  Abdomen was soft with active bowel sounds. There is no clubbing cyanosis or edema. Skin Warm and dry wound looks without erythema or tenderness    ICD Specifications Following MD:  Sherryl Manges, MD     ICD Vendor:  [employer]     ICD Model Number:  [phone]     ICD Serial Number:  [id number] ICD DOI:  03/09/2008     ICD Implanting MD:  Sherryl Manges, MD  Lead 1:    Location: RA     DOI: 10/26/2006     Model #: 1478     Serial #: [id number]     Status: active Lead 2:    Location: RV     DOI: 10/26/2006     Model #: 5784     Serial #: [id number]     Status: active Lead 3:    Location: RV     DOI: 03/09/2008     Model #: 7120  Serial #: [id number]     Status: active Lead 4:    Location: LV     DOI: 03/09/2008     Model #: 1191     Serial #: [id number]     Status: active  Indications::  CHF AV node ablatiion  Explantation Comments: Pacemaker dependent  ICD Follow Up Battery Voltage:  3.07 V     Charge Time:  8.7 seconds     Underlying rhythm:  A-AF V-PACED ICD Dependent:  Yes       ICD Device Measurements Atrium:  Amplitude: 1.5 mV, Impedance: 703 ohms,  Right Ventricle:  Amplitude: 12.1 mV, Impedance: 646 ohms, Threshold: 0.50 V at 0.40 msec Left Ventricle:  Impedance: 1102 ohms, Threshold: 0.75 V at 0.40 msec Configuration: LV TIP TO LV RING Shock Impedance: 50/66 ohms   Episodes MS Episodes:  1     Percent Mode Switch:  100%     Coumadin:  No Shock:  0     ATP:  0     Nonsustained:  0     Atrial Pacing:  0.1%     Ventricular Pacing:  99.9%  Brady Parameters Mode DDIR     Lower Rate Limit:  70     Upper Rate Limit 120 PAV 140     Rate Response Parameters:  ADL  response-3, Exertion response-3, Threshold-Med/Low, Acceleration-30 seconds, Deceleration-Exercise  Tachy Zones VF:  188     VT:  OFF     Next Remote Date:  04/25/2010     Next Cardiology Appt Due:  01/20/2011 Tech Comments:  PT IN AF 100% OF TIME. + COUMADIN.  NORMAL DEVICE FUNCTION.  OPTIVOL STABLE. CHANGES MADE FOR VF AND FVT ZONE PER SK. CARELINK 04-25-10 AND ROV IN 12 MTHS W/SK. Michele [name]  January 22, 2010 2:25 PM  Impression & Recommendations:  Problem # 1:  AUTOMATIC IMPLANTABLE CARDIAC DEFIBRILLATOR SITU (ICD-V45.02) Device parameters and data were reviewed and no changes were made  Problem # 2:  AV BLOCK,S/P AV ABLATION (ICD-426.0) without intrinsic conduction  Her updated medication list for this problem includes:    Warfarin Sodium 5 Mg Tabs (Warfarin sodium) ..... Use as directed by anticoagulation clinic  Problem # 3:  CARDIOMYOPATHY-TACHYCARDIA INDUCED RESOLVED (ICD-425.4) resolved Her updated medication list for this problem includes:    Warfarin Sodium 5 Mg Tabs (Warfarin sodium) ..... Use as directed by anticoagulation clinic    Furosemide 20 Mg Tabs (Furosemide) .Marland Kitchen... Take one tablet by mouth every other day  Patient Instructions: 1)  Your physician recommends that you continue on your current medications as directed. Please refer to the Current Medication list given to you today. 2)  Your physician wants you to follow-up in: 1 year  You will receive a reminder letter in the mail two months in advance. If you don't receive a letter, please call our office to schedule the follow-up appointment.

## 2010-03-19 NOTE — Assessment & Plan Note (Signed)
Summary: continued diarrhea/sheri   History of Present Illness Visit Type: Follow-up Visit Primary GI MD: Elie Goody MD [facility] Primary Provider: Marga Melnick, MD Chief Complaint: still c/o diarrhea primarily after eating  several times per day  Some RLQ pain History of Present Illness:   Michele [name] returns today with persistent diarrhea. Michele [name] is having between 3 and 6 bowel movements each day that are described as loose and watery. The stools are urgent and often occurring before Michele [name] has finished a meal. The diarrhea generally occurs within an hour after a meal. Her weight is stable. Her appetite is good and Michele [name] still has a mild chronic RLQ pain.   GI Review of Systems    Reports abdominal pain.     Location of  Abdominal pain: RLQ.    Denies acid reflux, belching, bloating, chest pain, dysphagia with liquids, dysphagia with solids, heartburn, loss of appetite, nausea, vomiting, vomiting blood, weight loss, and  weight gain.      Reports diarrhea.     Denies anal fissure, black tarry stools, change in bowel habit, constipation, diverticulosis, fecal incontinence, heme positive stool, hemorrhoids, irritable bowel syndrome, jaundice, light color stool, liver problems, rectal bleeding, and  rectal pain.   Current Medications (verified): 1)  Multivitamins  Tabs (Multiple Vitamin) .... Take 1 Tablet By Mouth Once A Day 2)  Synthroid 125 Mcg  Tabs (Levothyroxine Sodium) .... 1/2 Pill On  Mon,weds, Fri & Sun ; 1 Pill T,th,sat 3)  Vitamin D3 1000 Unit  Caps (Cholecalciferol) .Marland Kitchen.. 1 By Mouth Once Daily 4)  Calcium Carbonate 600 Mg Tabs (Calcium Carbonate) .... Take 1 Tablet Two Times A Day 5)  Coenzyme Q10 200 Mg Caps (Coenzyme Q10) .... Take 1 Tablet By Mouth Once A Day 6)  Salmon Oil  Caps (Nutritional Supplements) .... 1000mg  Daily 7)  Warfarin Sodium 5 Mg Tabs (Warfarin Sodium) .... Use As Directed By Anticoagulation Clinic 8)  Biotin 1000 Mcg Tabs (Biotin) .Marland Kitchen.. 1 By Mouth Once Daily 9)   Florastor 250 Mg Caps (Saccharomyces Boulardii) .Marland Kitchen.. 1 By Mouth Once Daily 10)  Furosemide 20 Mg Tabs (Furosemide) .... Take One Tablet By Mouth Every Other Day  Allergies (verified): 1)  ! Colchicine 2)  Lamisil (Terbinafine Hcl) 3)  Erythromycin  Past History:  Past Medical History: Reviewed history from 08/08/2009 and no changes required. cardiomyopathy-rate related. Status post AV junction ablation Status post pacemaker with CRT-D. grade([employer]tr[employer]) Hypothyroidism Thyroid nodules (goiter) 1990 Colonic Polyposis/negative colon 09/2000 TUBULAR ADENOMA 06/19/2009 diverticulosis C.diff 11/2006 Atrial fibrillation-neg stress cardilite 04/30/03 Gilbert's Syndrome 09/2003 Diverticulitis, hx of 2010  Past Surgical History: Reviewed history from 08/08/2009 and no changes required. thyroid bx 1990 , Dr Juleen China, Endocrinologist; single nodule Throidectomy (12/08) Appendectomy (9518) Cholecystectomy (8416) Hysterectomy (1973) Pacemaker Placement (9/08) ICD 1/10 Breast Biopsy (1997) Rotator cuff (x2) Colonscopy 2002:minute polyps resected, 2011  Family History: Reviewed history from 03/20/2009 and no changes required. Father:  Mother: MI died 65 Siblings: bro goiter,MI died age 89 sister-bronchitis No FH of Colon Cancer: Family History of Colitis/Crohn's: Son-UC  Social History: Reviewed history from 03/20/2009 and no changes required. Michele [name] and her husband moved to the [place] from [place] over 25 years ago. He was with [employer]. Patient has never smoked.  Alcohol Use - no Illicit Drug Use - no  Review of Systems       The patient complains of cough.         The pertinent positives and negatives are noted as above  and in the HPI. All other ROS were reviewed and were negative.   Vital Signs:  Patient profile:   77 year old female Height:      63 inches Weight:      171 pounds BMI:     30.40 Pulse rate:   80 / minute Pulse rhythm:   regular BP sitting:   124 /  76  (left arm)  Vitals Entered By: Milford Cage NCMA (September 20, 2009 10:19 AM)  Physical Exam  General:  Well developed, well nourished, no acute distress. Head:  Normocephalic and atraumatic. Eyes:  PERRLA, no icterus. Mouth:  No deformity or lesions, dentition normal. Lungs:  Clear throughout to auscultation. Heart:  Regular rate and rhythm; no murmurs, rubs,  or bruits. Abdomen:  Soft, nontender and nondistended. No masses, hepatosplenomegaly or hernias noted. Normal bowel sounds. Psych:  Alert and cooperative. Normal mood and affect.  Impression & Recommendations:  Problem # 1:  DIARRHEA (ICD-787.91) Chronic diarrhea, without etiology identified. Extensive evaluation unrevealing to date. Rule out celiac disease, IBS. Trial of Levsin a.c. Obtain a celiac panel, and if positive proceed with upper endoscopy with biopsies off Coumadin. Orders: TLB-IgA (Immunoglobulin A) (82784-IGA) T-Sprue Panel (Celiac Disease Aby Eval) (83516x3/[address])  Problem # 2:  ABDOMINAL PAIN-RLQ (ICD-789.03) Chronic stable. No clear gastrointestinal etiology.  Problem # 3:  FAMILY HX COLON CANCER (ICD-V16.0)  Problem # 4:  COUMADIN THERAPY (ICD-V58.61)  Patient Instructions: 1)  Get your labs drawn today in the basement.  2)  Pick up your prescription from your pharmacy.  3)  Copy sent to : Marga Melnick, MD 4)  The medication list was reviewed and reconciled.  All changed / newly prescribed medications were explained.  A complete medication list was provided to the patient / caregiver.  Prescriptions: LEVSIN 0.125 MG TABS (HYOSCYAMINE SULFATE) 1-2 tablets by mouth before meals three times a day  #90 x 5   Entered by:   Christie Nottingham CMA (AAMA)   Authorized by:   Meryl Dare MD [facility]   Signed by:   Meryl Dare MD [facility] on 09/20/2009   Method used:   Electronically to        [facility]* (retail)       [address]       [place], [place]  [id number]       Ph:  [phone]       Fax: [phone]   RxID:   [id number]

## 2010-03-19 NOTE — Letter (Signed)
Summary: Patient Notice- Polyp Results  [facility]  [address] [place], [place] [address]   Phone: [phone]  Fax: [phone]        Jun 21, 2009 MRN: [id number]    [facility] [place] [address] [place], [place]  [address]    Dear Ms. Zimbelman,  I am pleased to inform you that the colon polyp(s) removed during your recent colonoscopy was (were) found to be benign (no cancer detected) upon pathologic examination. The random colon biopsies were normal.  I recommend you have a repeat colonoscopy examination in 5 years to look for recurrent polyps, as having colon polyps increases your risk for having recurrent polyps or even colon cancer in the future.  Should you develop new or worsening symptoms of abdominal pain, bowel habit changes or bleeding from the rectum or bowels, please schedule an evaluation with either your primary care physician or with me.  Continue treatment plan as outlined the day of your exam.  Please call us if you are having persistent problems or have questions about your condition that have not been fully answered at this time.  Sincerely,  Meryl Dare MD [facility]  This letter has been electronically signed by your physician.  Appended Document: Patient Notice- Polyp Results letter mailed 4.9.11.

## 2010-03-19 NOTE — Letter (Signed)
Summary: Results Follow up Letter  [facility] [place]  [address] [place], [place] [address]   Phone: [phone]  Fax: [phone]    03/06/2009 MRN: [id number]  [facility] [place] [address] [place], [place]  [address]  Dear Ms. Stonehocker,  The following are the results of your recent test(s):  Test         Result    Pap Smear:        Normal _____  Not Normal _____ Comments: ______________________________________________________ Cholesterol: LDL(Bad cholesterol):         Your goal is less than:         HDL (Good cholesterol):       Your goal is more than: Comments:  ______________________________________________________ Mammogram:        Normal _____  Not Normal _____ Comments:  ___________________________________________________________________ Hemoccult:        Normal _____  Not normal _______ Comments:    _____________________________________________________________________ Other Tests: Please see attached labs done on 03/01/2009    We routinely do not discuss normal results over the telephone.  If you desire a copy of the results, or you have any questions about this information we can discuss them at your next office visit.   Sincerely,

## 2010-03-19 NOTE — Progress Notes (Signed)
Summary: question about coumadin medication  Phone Note Call from Patient Call back at [facility] Phone [phone]   Caller: Patient Summary of Call: Pt calling regarding her medication Initial call taken by: Judie Grieve,  March 29, 2009 9:02 AM  Follow-up for Phone Call        Pt. finishes Flagyl on 04/03/09 not on 03/26/09 per her spouse.  Follow-up by: Bethena Midget, RN, BSN,  March 29, 2009 9:46 AM

## 2010-03-19 NOTE — Letter (Signed)
Summary: Results Follow up Letter  [facility] [place]  [address] [place], [place] [address]   Phone: [phone]  Fax: [phone]    03/05/2009 MRN: [id number]  [facility] [place] [address] [place], [place]  [address]  Dear Ms. Beavin,  The following are the results of your recent test(s):  Test         Result    Pap Smear:        Normal _____  Not Normal _____ Comments: ______________________________________________________ Cholesterol: LDL(Bad cholesterol):         Your goal is less than:         HDL (Good cholesterol):       Your goal is more than: Comments:  ______________________________________________________ Mammogram:        Normal _____  Not Normal _____ Comments:  ___________________________________________________________________ Hemoccult:        Normal _____  Not normal _______ Comments:    _____________________________________________________________________ Other Tests: Please see attached labs done on 03/02/2009    We routinely do not discuss normal results over the telephone.  If you desire a copy of the results, or you have any questions about this information we can discuss them at your next office visit.   Sincerely,

## 2010-03-19 NOTE — Progress Notes (Signed)
Summary: Lab results  Phone Note Outgoing Call Call back at [facility] Phone [phone]   Call placed by: Shonna Chock,  March 06, 2009 12:31 PM Call placed to: Patient Summary of Call: Spoke with patient Re labs Liver & kidney function are normal. Blood counts & Thyroid also were requested; I've asked Lab to verify these were done as ordered. A trial of digestive enzyme with meals is an option ; but GI consultation needed if labs normal & symptoms do not respond.  TSH is excellent(goal = 1-3).Blood counts suggest recent viral process as lymphocyte count elevated (lymph count was 22% in 09/2008).GI consult if no better with digestive enzyme trial with meals.  Patient ok'd all information and would like rx for Pancreatin sent to [facility]. Rx was faxed and copy of labs mailed./[facility]  March 06, 2009 12:32 PM

## 2010-03-19 NOTE — Medication Information (Signed)
Summary: rovm.  Anticoagulant Therapy  Managed by: Cloyde Reams, RN, BSN Referring MD: Valera Castle MD PCP: Marga Melnick MD Supervising MD: Juanda Chance MD, Ommie Degeorge Indication 1: Atrial Fibrillation (ICD-427.31) Lab Used: [facility] Site: [facility] INR POC 3.4 INR RANGE 2 - 3  Dietary changes: yes       Details: Ate a little richer, more creams over holiday and sweets.  Health status changes: no    Bleeding/hemorrhagic complications: no    Recent/future hospitalizations: no    Any changes in medication regimen? no    Recent/future dental: no  Any missed doses?: no       Is patient compliant with meds? yes       Allergies (verified): 1)  ! * Lamisil 2)  ! * Emycin  Anticoagulation Management History:      The patient is taking warfarin and comes in today for a routine follow up visit.  Positive risk factors for bleeding include an age of 21 years or older.  Negative risk factors for bleeding include no history of CVA/TIA.  The bleeding index is 'intermediate risk'.  Positive CHADS2 values include Age > 14 years old.  Negative CHADS2 values include History of HTN, History of Diabetes, and Prior Stroke/CVA/TIA.  The start date was 04/24/2001.  Her last INR was 1.8.  Anticoagulation responsible provider: Juanda Chance MD, Smitty Cords.  INR POC: 3.4.  Cuvette Lot#: [id number].  Exp: 03/2010.    Anticoagulation Management Assessment/Plan:      The patient's current anticoagulation dose is Warfarin sodium 5 mg tabs: Use as directed by Anticoagulation Clinic.  The target INR is 2 - 3.  The next INR is due 03/13/2009.  Anticoagulation instructions were given to patient.  Results were reviewed/authorized by Cloyde Reams, RN, BSN.  She was notified by Cloyde Reams RN.         Prior Anticoagulation Instructions: INR 2.5  Continue taking 1.5 tabs each Thursday and 1 tab on all other days.  Recheck in 4 weeks.    Current Anticoagulation Instructions: INR 3.4  Skip today's dose of coumadin then  resume same dosage 1 tablet daily except 1.5 tablets on Thursdays.  Recheck in 3 weeks.

## 2010-03-19 NOTE — Progress Notes (Signed)
Summary: question re referral to ortho/  Phone Note Call from Patient Call back at Home Phone [phone]   Caller: Patient Call For: lowne Summary of Call: patient was referred to ortho - she had a blister on the broken finger she said blister broke - wants to  know if she still needs to see ortho Initial call taken by: Okey Regal [place],  May 22, 2009 2:37 PM  Follow-up for Phone Call        Spoke with pt who says blister on finger burst, yellow thick drainage came out, not as painful now that inflammation drainged, area is dry now, not quite flat. --Still doing soaks and using Keflex, pt said she did call [facility] and informed, was recommended to call pcp. Pt did not make appt yet, is appt still needed .Kandice Hams  May 22, 2009 4:10 PM  Follow-up by: Kandice Hams,  May 22, 2009 4:10 PM  Additional Follow-up for Phone Call Additional follow up Details #1::        NO SHE CAN WAIT Additional Follow-up by: Loreen Freud DO,  May 22, 2009 5:03 PM    Additional Follow-up for Phone Call Additional follow up Details #2::    pt informed .Kandice Hams  May 22, 2009 5:11 PM

## 2010-03-19 NOTE — Letter (Signed)
Summary: Appt Reminder 2  [facility]  [address] [place], [place] [address]   Phone: [phone]  Fax: [phone]        Jun 20, 2009 MRN: [id number]    [facility] [address] [place], [place]  [address]    Dear Michele [name],   You have a return appointment with Dr. [name] on 08/08/09 at 10:30 am.  Please remember to bring a complete list of the medicines you are taking, your insurance card and your co-pay.  If you have to cancel or reschedule this appointment, please call before 5:00 pm the evening before to avoid a cancellation fee.  If you have any questions or concerns, please call [phone].    Sincerely,    Darcey Nora RN, CGRN  Appended Document: Appt Reminder 2 letter mailed to patient's home

## 2010-03-19 NOTE — Progress Notes (Signed)
Summary: FAILED ORTHOPAEDIC REFERRAL  Phone Note Other Incoming   Summary of Call: IN REFERENCE TO ORTHOPAEDIC REFERRAL........Marland KitchenPER FAX FROM [facility], THEY CONTACTED TO PATIENT TO SCH'D AN APPT & PATIENT DECLINED, STATED SHE DIDN'T WANT AN APPT. Initial call taken by: Magdalen Spatz [facility],  May 22, 2009 4:23 PM  Follow-up for Phone Call        noted Follow-up by: Marga Melnick MD,  May 22, 2009 5:37 PM

## 2010-03-19 NOTE — Progress Notes (Signed)
Summary: triage  Phone Note Call from Patient Call back at Home Phone [phone]   Caller: Patient Call For: Dr. [name] Reason for Call: Talk to Nurse Summary of Call: pt has upcoming COL on 5/3, but calling in today to report "terrible diarrhea" and unable to go anywhere... BM's mostly liquid... diarrhea started "just after Thak[place]" Initial call taken by: Vallarie Mare,  May 17, 2009 10:17 AM  Follow-up for Phone Call        Pt.is asking if there is any sooner procedure appt's. Informed there is none but I will get her put on cx. list. Follow-up by: Teryl Lucy RN,  May 17, 2009 10:33 AM

## 2010-03-19 NOTE — Medication Information (Signed)
Summary: CCR  Anticoagulant Therapy  Managed by: Cloyde Reams, RN, BSN Referring MD: Valera Castle MD PCP: Marga Melnick, MD Supervising MD: Gala Romney MD, Reuel Boom Indication 1: Atrial Fibrillation (ICD-427.31) Lab Used: [facility] Site: [facility] INR POC 2.3 INR RANGE 2 - 3  Dietary changes: no    Health status changes: no    Bleeding/hemorrhagic complications: no    Recent/future hospitalizations: no    Any changes in medication regimen? yes       Details: Took Keflex x 1 week.  Completed.    Recent/future dental: no  Any missed doses?: no       Is patient compliant with meds? yes       Allergies: 1)  ! Colchicine 2)  Lamisil (Terbinafine Hcl) 3)  Erythromycin  Anticoagulation Management History:      The patient is taking warfarin and comes in today for a routine follow up visit.  Positive risk factors for bleeding include an age of 45 years or older.  Negative risk factors for bleeding include no history of CVA/TIA.  The bleeding index is 'intermediate risk'.  Positive CHADS2 values include Age > 12 years old.  Negative CHADS2 values include History of HTN, History of Diabetes, and Prior Stroke/CVA/TIA.  The start date was 04/24/2001.  Her last INR was 1.8.  Anticoagulation responsible provider: Bensimhon MD, Reuel Boom.  INR POC: 2.3.  Cuvette Lot#: [id number].  Exp: 06/2010.    Anticoagulation Management Assessment/Plan:      The patient's current anticoagulation dose is Warfarin sodium 5 mg tabs: Use as directed by Anticoagulation Clinic.  The target INR is 2 - 3.  The next INR is due 06/07/2009.  Anticoagulation instructions were given to patient.  Results were reviewed/authorized by Cloyde Reams, RN, BSN.  She was notified by Cloyde Reams RN.         Prior Anticoagulation Instructions: INR=2.3 The patient is to continue with the same dose of coumadin.  This dosage includes:  1 tablet (5mg ) daily, except 1 1/2 tablets on Thursday   Current Anticoagulation  Instructions: INR 2.3  Continue on same dosage 1 tablet daily except 1.5 tablets on Thursdays.  Recheck in 4 weeks.

## 2010-03-19 NOTE — Letter (Signed)
Summary: [facility]  [facility]   Imported By: Sherian Rein 08/09/2009 15:03:51  _____________________________________________________________________  External Attachment:    Type:   Image     Comment:   External Document

## 2010-03-19 NOTE — Progress Notes (Signed)
Summary: speak to nurse  Phone Note Call from Patient Call back at Home Phone [phone]   Caller: Patient Call For: [name] Reason for Call: Talk to Nurse Summary of Call: Patient wants to speak to nurse regarding instructions Initial call taken by: Tawni Levy,  June 06, 2009 2:42 PM  Follow-up for Phone Call        Reviewed time and date  changes with pt. re: prep. Follow-up by: Wyona Almas RN,  June 06, 2009 5:25 PM

## 2010-03-19 NOTE — Progress Notes (Signed)
Summary: Triage  Phone Note Call from Patient Call back at Home Phone [phone]   Caller: Patient Call For: Mike Gip Reason for Call: Talk to Nurse Summary of Call: Pt is taking Flagyl and still has diarrhea. Initial call taken by: Karna Christmas,  April 04, 2009 4:37 PM  Follow-up for Phone Call        Pt is feeling some better but is still having frequent diarrhea also when urinating.  I spoke to [employer]  and she will review this at home and flag me in the AM.  I did tell the pt I will speak to Bernell Sigal this afternoon and I will call her tomorrow and she was fine with that. Follow-up by: Joselyn Glassman,  April 05, 2009 4:28 PM  Additional Follow-up for Phone Call Additional follow up Details #1::        Patient  is scheduled for a colonoscopy for 05/07/09 2:00, pre-visit for 05/01/09 1:30.  I have sent a letter to Dr Graciela Husbands to get permission to remove patient from her coumadin prior to the appointment Additional Follow-up by: Darcey Nora RN, CGRN,  April 06, 2009 11:02 AM

## 2010-03-19 NOTE — Medication Information (Signed)
Summary: rov/tm  Anticoagulant Therapy  Managed by: Weston Brass, PharmD Referring MD: Valera Castle MD PCP: Marga Melnick, MD Supervising MD: Gala Romney MD, Reuel Boom Indication 1: Atrial Fibrillation (ICD-427.31) Lab Used: [facility] Site: [facility] INR POC 3.1 INR RANGE 2 - 3  Dietary changes: no    Health status changes: no    Bleeding/hemorrhagic complications: no    Recent/future hospitalizations: no    Any changes in medication regimen? no    Recent/future dental: no  Any missed doses?: no       Is patient compliant with meds? yes       Allergies: 1)  ! Colchicine 2)  Lamisil (Terbinafine Hcl) 3)  Erythromycin  Anticoagulation Management History:      The patient is taking warfarin and comes in today for a routine follow up visit.  Positive risk factors for bleeding include an age of 75 years or older.  Negative risk factors for bleeding include no history of CVA/TIA.  The bleeding index is 'intermediate risk'.  Positive CHADS2 values include Age > 4 years old.  Negative CHADS2 values include History of HTN, History of Diabetes, and Prior Stroke/CVA/TIA.  The start date was 04/24/2001.  Her last INR was 1.8.  Anticoagulation responsible provider: Lalita Ebel MD, Reuel Boom.  INR POC: 3.1.  Exp: 11/2010.    Anticoagulation Management Assessment/Plan:      The patient's current anticoagulation dose is Warfarin sodium 5 mg tabs: Use as directed by Anticoagulation Clinic.  The target INR is 2 - 3.  The next INR is due 11/01/2009.  Anticoagulation instructions were given to patient.  Results were reviewed/authorized by Weston Brass, PharmD.  She was notified by Gweneth Fritter, PharmD Candidate.         Prior Anticoagulation Instructions: INR 2.9 Continue 5mg s daily except 7.5mg s on Thursdays. Recheck in 4 weeks.   Current Anticoagulation Instructions: INR 3.1  Take 1 tablet (5mg ) tonight.  Then resume taking 1 tablet (5mg ) every day except take 1.5 tablets (7.5mg ) on Thursdays.   Recheck in 4 weeks.

## 2010-03-19 NOTE — Progress Notes (Signed)
Summary: Michele [name] no longer need med  Phone Note Call from Patient Call back at [facility] Phone [phone]   Caller: Patient Summary of Call: pt husband left VM on chrae VM stating that pt wife stomach/bowel issue have resolved on there own so pt will not need  the rx for CREON  anymore.................Marland KitchenFelecia Deloach CMA  March 09, 2009 11:04 AM   Follow-up for Phone Call        noted Follow-up by: Marga Melnick MD,  March 11, 2009 9:42 AM

## 2010-03-19 NOTE — Progress Notes (Signed)
Summary: triage  Phone Note Call from Patient Call back at Home Phone [phone] Call back at Work Phone [phone]   Caller: Patient Call For: [name] Reason for Call: Talk to Nurse Summary of Call: Patient has severe diarrhea medication that other Dr gave her is not working, wants to be seen before first available 2-24. Initial call taken by: Tawni Levy,  March 20, 2009 8:07 AM  Follow-up for Phone Call        Patient  with 2 month hx of diarrhea, was started on Creon but she has stopped this.  Patient  with 6-7 watery BM daily.  She is only eating bananas and mashed potatos.  Hx of c-diff colitis in 08.  No recent antibiotics.  Patient  will come in today and see Mike Gip PA at 10:30 Follow-up by: Darcey Nora RN, CGRN,  March 20, 2009 9:20 AM

## 2010-03-19 NOTE — Miscellaneous (Signed)
Summary: [facility] Previsit/prep  Clinical Lists Changes  Medications: Added new medication of MOVIPREP 100 GM  SOLR (PEG-KCL-NACL-NASULF-NA ASC-C) As per prep instructions. - Signed Rx of MOVIPREP 100 GM  SOLR (PEG-KCL-NACL-NASULF-NA ASC-C) As per prep instructions.;  #1 x 0;  Signed;  Entered by: Wyona Almas RN;  Authorized by: Meryl Dare MD Clementeen Graham;  Method used: Electronically to [facility]*, [address], [place], [place]  [id number], Ph: [phone], Fax: [phone] Allergies: Changed allergy or adverse reaction from LAMISIL (TERBINAFINE HCL) to LAMISIL (TERBINAFINE HCL) Changed allergy or adverse reaction from ERYTHROMYCIN to ERYTHROMYCIN Observations: Added new observation of ALLERGY REV: Done (05/01/2009 13:07)    Prescriptions: MOVIPREP 100 GM  SOLR (PEG-KCL-NACL-NASULF-NA ASC-C) As per prep instructions.  #1 x 0   Entered by:   Wyona Almas RN   Authorized by:   Meryl Dare MD [facility]   Signed by:   Wyona Almas RN on 05/01/2009   Method used:   Electronically to        [facility]* (retail)       [address]       [place], [place]  [id number]       Ph: [phone]       Fax: [phone]   RxID:   [id number]

## 2010-03-19 NOTE — Medication Information (Signed)
Summary: rov/ewj  Anticoagulant Therapy  Managed by: Bethena Midget, RN Referring MD: Valera Castle MD PCP: Marga Melnick MD Supervising MD: Excell Seltzer MD, Casimiro Needle Indication 1: Atrial Fibrillation (ICD-427.31) Lab Used: [facility] Site: [facility] INR POC 2.3 INR RANGE 2 - 3  Dietary changes: no    Health status changes: no    Bleeding/hemorrhagic complications: no    Recent/future hospitalizations: no    Any changes in medication regimen? no    Recent/future dental: no  Any missed doses?: no       Is patient compliant with meds? yes       Allergies (verified): 1)  ! * Lamisil 2)  ! * Emycin  Anticoagulation Management History:      The patient is taking warfarin and comes in today for a routine follow up visit.  Positive risk factors for bleeding include an age of 75 years or older.  Negative risk factors for bleeding include no history of CVA/TIA.  The bleeding index is 'intermediate risk'.  Positive CHADS2 values include Age > 75 years old.  Negative CHADS2 values include History of HTN, History of Diabetes, and Prior Stroke/CVA/TIA.  The start date was 04/24/2001.  Her last INR was 1.8.  Anticoagulation responsible provider: Excell Seltzer MD, Casimiro Needle.  INR POC: 2.3.  Cuvette Lot#: [id number].  Exp: 05/2010.    Anticoagulation Management Assessment/Plan:      The patient's current anticoagulation dose is Warfarin sodium 5 mg tabs: Use as directed by Anticoagulation Clinic.  The target INR is 2 - 3.  The next INR is due 04/10/2009.  Anticoagulation instructions were given to patient.  Results were reviewed/authorized by Bethena Midget, RN.  She was notified by Ysidro Evert, Pharm D Candidate.         Prior Anticoagulation Instructions: INR 3.4  Skip today's dose of coumadin then resume same dosage 1 tablet daily except 1.5 tablets on Thursdays.  Recheck in 3 weeks.    Current Anticoagulation Instructions: INR: 2.3 Continue with same dosage of 5mg  tablet daily except 7.5mg  on  Thursdays Recheck in 4 weeks

## 2010-03-19 NOTE — Letter (Signed)
Summary: [facility] Instructions  [facility]  [address] [place], [place] [address]   Phone: [phone]  Fax: [phone]       Michele [name]    14-Dec-1929    MRN: [id number]        Procedure Day /Date:MONDAY  05/07/09     Arrival Time:  1:00PM     Procedure Time:  2:00PM     Location of Procedure:                    _ X_  [facility] (4th Floor)                        PREPARATION FOR COLONOSCOPY WITH MOVIPREP   Starting 5 days prior to your procedure 05/02/09 do not eat nuts, seeds, popcorn, corn, beans, peas,  salads, or any raw vegetables.  Do not take any fiber supplements (e.g. Metamucil, Citrucel, and Benefiber).  THE DAY BEFORE YOUR PROCEDURE         DATE: 05/06/09  DAY: SUNDAY  1.  Drink clear liquids the entire day-NO SOLID FOOD  2.  Do not drink anything colored red or purple.  Avoid juices with pulp.  No orange juice.  3.  Drink at least 64 oz. (8 glasses) of fluid/clear liquids during the day to prevent dehydration and help the prep work efficiently.  CLEAR LIQUIDS INCLUDE: Water Jello Ice Popsicles Tea (sugar ok, no milk/cream) Powdered fruit flavored drinks Coffee (sugar ok, no milk/cream) Gatorade Juice: apple, white grape, white cranberry  Lemonade Clear bullion, consomm, broth Carbonated beverages (any kind) Strained chicken noodle soup Hard Candy                             4.  In the morning, mix first dose of MoviPrep solution:    Empty 1 Pouch A and 1 Pouch B into the disposable container    Add lukewarm drinking water to the top line of the container. Mix to dissolve    Refrigerate (mixed solution should be used within 24 hrs)  5.  Begin drinking the prep at 5:00 p.m. The MoviPrep container is divided by 4 marks.   Every 15 minutes drink the solution down to the next mark (approximately 8 oz) until the full liter is complete.   6.  Follow completed prep with 16 oz of clear liquid of your choice (Nothing  red or purple).  Continue to drink clear liquids until bedtime.  7.  Before going to bed, mix second dose of MoviPrep solution:    Empty 1 Pouch A and 1 Pouch B into the disposable container    Add lukewarm drinking water to the top line of the container. Mix to dissolve    Refrigerate  THE DAY OF YOUR PROCEDURE      DATE: 05/07/09  DAY: MONDAY  Beginning at 9:00AM (5 hours before procedure):         1. Every 15 minutes, drink the solution down to the next mark (approx 8 oz) until the full liter is complete.  2. Follow completed prep with 16 oz. of clear liquid of your choice.    3. You may drink clear liquids until 12:00PM (2 HOURS BEFORE PROCEDURE).   MEDICATION INSTRUCTIONS  Unless otherwise instructed, you should take regular prescription medications with a small sip of water   as early as possible the morning of your  procedure.    Stop taking Coumadin on  05/03/09  (5 days before procedure).           OTHER INSTRUCTIONS  You will need a responsible adult at least 75 years of age to accompany you and drive you home.   This person must remain in the waiting room during your procedure.  Wear loose fitting clothing that is easily removed.  Leave jewelry and other valuables at home.  However, you may wish to bring a book to read or  an iPod/MP3 player to listen to music as you wait for your procedure to start.  Remove all body piercing jewelry and leave at home.  Total time from sign-in until discharge is approximately 2-3 hours.  You should go home directly after your procedure and rest.  You can resume normal activities the  day after your procedure.  The day of your procedure you should not:   Drive   Make legal decisions   Operate machinery   Drink alcohol   Return to work  You will receive specific instructions about eating, activities and medications before you leave.    The above instructions have been reviewed and explained to me by   Wyona Almas RN  May 01, 2009 2:06 PM     I fully understand and can verbalize these instructions _____________________________ Date _________

## 2010-03-19 NOTE — Letter (Signed)
Summary: Beather Arbour MD  Beather Arbour MD   Imported By: Lanelle Bal 07/18/2009 12:43:25  _____________________________________________________________________  External Attachment:    Type:   Image     Comment:   External Document

## 2010-03-19 NOTE — Medication Information (Signed)
Summary: rov/cs  Anticoagulant Therapy  Managed by: Lyna Poser, PharmD Referring MD: Valera Castle MD PCP: Marga Melnick, MD Supervising MD: Daleen Squibb MD,Thomas Indication 1: Atrial Fibrillation (ICD-427.31) Lab Used: [facility] Site: [facility] INR POC 2.8 INR RANGE 2 - 3  Dietary changes: yes       Details: been on a liquid diet   Health status changes: no    Bleeding/hemorrhagic complications: yes       Details: had a nose bleed that wouldn't stop once for about 10 minutes  Recent/future hospitalizations: yes       Details: was in the ER twice last week once for urinary retention and once for allergic rx to keflex. did not change coumadin dose.   Any changes in medication regimen? no    Recent/future dental: no  Any missed doses?: no       Is patient compliant with meds? yes      Comments: been off all antiobiotics for a week  Allergies: 1)  ! Colchicine 2)  ! Crestor (Rosuvastatin Calcium) 3)  ! Keflex 4)  Lamisil (Terbinafine Hcl) 5)  Erythromycin  Anticoagulation Management History:      The patient is taking warfarin and comes in today for a routine follow up visit.  Positive risk factors for bleeding include an age of 75 years or older.  Negative risk factors for bleeding include no history of CVA/TIA.  The bleeding index is 'intermediate risk'.  Positive CHADS2 values include Age > 75 years old.  Negative CHADS2 values include History of HTN, History of Diabetes, and Prior Stroke/CVA/TIA.  The start date was 04/24/2001.  Her last INR was 1.8.  Anticoagulation responsible provider: Wall MD,Thomas.  INR POC: 2.8.  Cuvette Lot#: [id number].  Exp: 12/2010.    Anticoagulation Management Assessment/Plan:      The patient's current anticoagulation dose is Warfarin sodium 5 mg tabs: Use as directed by Anticoagulation Clinic.  The target INR is 2 - 3.  The next INR is due 01/17/2010.  Anticoagulation instructions were given to patient.  Results were reviewed/authorized by  Lyna Poser, PharmD.         Prior Anticoagulation Instructions: INR 2.8  Today, Friday, October 14th, take Coumadin 2.5 mg (0.5 tab). Then, continue taking Coumadin 5 mg (1 tab) every day. Return to clinic in 2 weeks.   Current Anticoagulation Instructions: INR 2.8 Continue taking 1 tablet everyday. Recheck in 4 weeks.

## 2010-03-19 NOTE — Letter (Signed)
Summary: Beather Arbour MD  Beather Arbour MD   Imported By: Lanelle Bal 12/20/2009 13:12:06  _____________________________________________________________________  External Attachment:    Type:   Image     Comment:   External Document

## 2010-03-19 NOTE — Assessment & Plan Note (Signed)
Summary: DIARRHEA/RH.....   Vital Signs:  Patient profile:   77 year old female Weight:      175 pounds Temp:     98.3 degrees F oral Pulse rate:   72 / minute Resp:     16 per minute BP sitting:   122 / 70  (left arm) Cuff size:   regular  Vitals Entered By: Shonna Chock (March 01, 2009 3:45 PM) CC: Diarrhea since November Comments REVIEWED MED LIST, PATIENT AGREED DOSE AND INSTRUCTION CORRECT    Primary Care Provider:  Marga Melnick MD  CC:  Diarrhea since November.  History of Present Illness: Very loose stool 30 min post meals since 12/2008. Probiotic qd of no benefit. The patient reports 4-6 stools per day, semiformed/loose stools, voluminous stools, malodorous stools, fecal urgency, fecal soiling, fasting diarrhea, and bloating, but denies blood in stool, mucus in stool, greasy stools, alternating diarrhea/constipation, nocturnal diarrhea, or gassiness. The patient denies fever, abdominal pain, abdominal cramps, nausea, vomiting, lightheadedness, increased thirst, weight loss, joint pains, mouth ulcers, and eye redness.  The symptoms are worse with any food.  Patient has a  history of diverticulitis in 10/2006 & in  11/2007. Dr Nicholas Lose, [place] ,  Rxed Cipro & Flagyl in 11/2007.Marland Kitchen The bowel changes have been treated with  Florastor & Citrracel  w/o control.PMH cholecystectomy in 1972.  Allergies: 1)  ! * Lamisil 2)  ! Pollyann Samples  Past History:  Past Medical History: cardiomyopathy-rate related. Status post AV junction ablation Status post pacemaker with CRT-D. grade([employer]) Hypothyroidism 1990-Thyroid nodules (goiter) Colonic Polyposis Atrial fibrillation-neg stress cardilite 04/30/03 8/05-Gilbert's Syndrome Diverticulitis, hx of 2010  Past Surgical History: thyroid bx 1990 , Dr Juleen China, Endocrinologist; single nodule Throidectomy (12/08) Appendectomy (1610) Cholecystectomy (9604) Hysterectomy (1973) Pacemaker Placement (9/08) Breast Biopsy (1997) Rotator cuff  (x2) Colonscopy 2002:minute polyps resected  Physical Exam  General:  well-nourished,in no acute distress; alert,appropriate and cooperative throughout examination Eyes:  No corneal or conjunctival inflammation noted.Perrla. No icterus Mouth:  Oral mucosa and oropharynx without lesions or exudates.  Teeth in good repair.No pharyngeal erythema.   Neck:  No deformities, masses, or tenderness noted. Lungs:  Normal respiratory effort, chest expands symmetrically. Lungs are clear to auscultation, no crackles or wheezes. Heart:  Normal rate and regular rhythm. S1 and S2 normal without gallop, murmur, click, rub . S4 with slurring Abdomen:  Bowel sounds positive,abdomen soft and non-tender without masses, organomegaly or hernias noted. Skin:  Intact without suspicious lesions or rashes. Slight tenting. No jaundice Cervical Nodes:  No lymphadenopathy noted Axillary Nodes:  No palpable lymphadenopathy Psych:  memory intact for recent and remote, normally interactive, and good eye contact.     Impression & Recommendations:  Problem # 1:  DIARRHEA (ICD-787.91)  Her updated medication list for this problem includes:    Florastor 250 Mg Caps (Saccharomyces boulardii) .Marland Kitchen... 1 by mouth once daily  Orders: Venipuncture (54098) TLB-TSH (Thyroid Stimulating Hormone) (84443-TSH) TLB-CBC Platelet - w/Differential (85025-CBCD) T- * Misc. Laboratory test [phone])  Problem # 2:  DIVERTICULITIS, HX OF (ICD-V12.79)  Orders: Venipuncture (78295) TLB-TSH (Thyroid Stimulating Hormone) (84443-TSH) TLB-CBC Platelet - w/Differential (85025-CBCD)  Problem # 3:  HYPOTHYROIDISM (ICD-244.9)  Her updated medication list for this problem includes:    Synthroid 125 Mcg Tabs (Levothyroxine sodium) .Marland Kitchen... 1/2 pill on  mon,weds, fri & sun ; 1 pill t,th,sat  Orders: Venipuncture (62130)  Complete Medication List: 1)  Mvi  .... Take as directed 2)  Synthroid 125 Mcg Tabs (Levothyroxine sodium) .Marland KitchenMarland KitchenMarland Kitchen  1/2 pill on   mon,weds, fri & sun ; 1 pill t,th,sat 3)  Vitamin D3 1000 Unit Caps (Cholecalciferol) .Marland Kitchen.. 1 by mouth once daily 4)  Calcium Carbonate 600 Mg Tabs (Calcium carbonate) .... Take 1 tablet two times a day 5)  Coq10 200mg   .... 1 by mouth once daily 6)  Salmon Oil Caps (Nutritional supplements) .... 1000mg  daily 7)  Warfarin Sodium 5 Mg Tabs (Warfarin sodium) .... Use as directed by anticoagulation clinic 8)  Lasix 20 Mg Tabs (Furosemide) .... Take one tablet by mouth every other day. 9)  Biotin 1000 Mcg Tabs (Biotin) .Marland Kitchen.. 1 by mouth once daily 10)  Florastor 250 Mg Caps (Saccharomyces boulardii) .Marland Kitchen.. 1 by mouth once daily 11)  Pancreatin 325 Mg Tabs (Pancreatin) .Marland KitchenMarland Kitchen. 1 with each meal  Patient Instructions: 1)  Keep Diary of stools with focus on specific food triggers such as milk or wheat. Prescriptions: PANCREATIN 325 MG TABS (PANCREATIN) 1 with each meal  #30 x 0   Entered by:   Shonna Chock   Authorized by:   Marga Melnick MD   Signed by:   Shonna Chock on 03/05/2009   Method used:   Print then Mail to Patient   RxID:   [id number]

## 2010-03-19 NOTE — Letter (Signed)
Summary: Beather Arbour MD  Beather Arbour MD   Imported By: Lanelle Bal 12/20/2009 13:17:16  _____________________________________________________________________  External Attachment:    Type:   Image     Comment:   External Document

## 2010-03-19 NOTE — Letter (Signed)
Summary: Beather Arbour MD  Beather Arbour MD   Imported By: Lanelle Bal 12/20/2009 13:17:57  _____________________________________________________________________  External Attachment:    Type:   Image     Comment:   External Document

## 2010-03-19 NOTE — Medication Information (Signed)
Summary: rov/ewj  Anticoagulant Therapy  Managed by: Elaina Pattee, PharmD Referring MD: Valera Castle MD PCP: Marga Melnick, MD Supervising MD: Excell Seltzer MD, Casimiro Needle Indication 1: Atrial Fibrillation (ICD-427.31) Lab Used: [facility] Site: [facility] INR POC 2.5 INR RANGE 2 - 3  Dietary changes: no    Health status changes: no    Bleeding/hemorrhagic complications: no    Recent/future hospitalizations: yes       Details: Colonoscopy on May 3, so pt will stop 5 days prior to colonoscopy. Ok per Dr. Graciela Husbands.  Any changes in medication regimen? no    Recent/future dental: no  Any missed doses?: no       Is patient compliant with meds? yes       Allergies: 1)  ! Colchicine 2)  Lamisil (Terbinafine Hcl) 3)  Erythromycin  Anticoagulation Management History:      The patient is taking warfarin and comes in today for a routine follow up visit.  Positive risk factors for bleeding include an age of 40 years or older.  Negative risk factors for bleeding include no history of CVA/TIA.  The bleeding index is 'intermediate risk'.  Positive CHADS2 values include Age > 57 years old.  Negative CHADS2 values include History of HTN, History of Diabetes, and Prior Stroke/CVA/TIA.  The start date was 04/24/2001.  Her last INR was 1.8.  Anticoagulation responsible provider: Excell Seltzer MD, Casimiro Needle.  INR POC: 2.5.  Cuvette Lot#: [id number].  Exp: 06/2010.    Anticoagulation Management Assessment/Plan:      The patient's current anticoagulation dose is Warfarin sodium 5 mg tabs: Use as directed by Anticoagulation Clinic.  The target INR is 2 - 3.  The next INR is due 06/25/2009.  Anticoagulation instructions were given to patient.  Results were reviewed/authorized by Elaina Pattee, PharmD.  She was notified by Elaina Pattee, PharmD.         Prior Anticoagulation Instructions: INR 2.3  Continue on same dosage 1 tablet daily except 1.5 tablets on Thursdays.  Recheck in 4 weeks.    Current Anticoagulation  Instructions: INR 2.5. Take 1 tablet daily except 1.5 tablets on Thursdays. Do not take Coumadin 4/28-5/3 for colonoscopy on 5/3.  When doctor tells you to restart Coumadin, take 1.5 tablets for 2 days, then take 1 tablet daily and 1.5 tablets on Thursdays.  Recheck 1 week after colonoscopy.

## 2010-03-19 NOTE — Procedures (Signed)
Summary: Colonoscopy  Patient: Shinika Estelle Note: All result statuses are Final unless otherwise noted.  Tests: (1) Colonoscopy (COL)   COL Colonoscopy           DONE     [facility]     [address]     [place], [place]  [address]           COLONOSCOPY PROCEDURE REPORT           PATIENT:  Tajanay, Hurley  MR#:  [id number]     BIRTHDATE:  05-27-1929, 79 yrs. old  GENDER:  female           ENDOSCOPIST:  [name] T. [name], MD, [facility]           PROCEDURE DATE:  06/19/2009     PROCEDURE:  Colonoscopy with biopsy     ASA CLASS:  Class III     INDICATIONS:  1) unexplained diarrhea           MEDICATIONS:   Fentanyl 50 mcg IV, Versed 5 mg IV           DESCRIPTION OF PROCEDURE:   After the risks benefits and     alternatives of the procedure were thoroughly explained, informed     consent was obtained.  Digital rectal exam was performed and     revealed no abnormalities.   The LB [id number] endoscope     was introduced through the anus and advanced to the terminal ileum     which was intubated for a short distance, without limitations.     The quality of the prep was excellent, using MoviPrep.  The     instrument was then slowly withdrawn as the colon was fully     examined.     <<PROCEDUREIMAGES>>           FINDINGS:  A sessile polyp was found in the cecum. It was 4 mm in     size. The polyp was removed using cold biopsy forceps. Moderate     diverticulosis was found in the sigmoid colon.  The terminal ileum     appeared normal.  This was otherwise a normal examination of the     colon. Random biopsies were obtained throughout the colon and sent     to pathology.  Retroflexed views in the rectum revealed internal     hemorrhoids, small. The time to cecum =  2  minutes. The scope was     then withdrawn (time =  12  min) from the patient and the     procedure completed.           COMPLICATIONS:  None           ENDOSCOPIC IMPRESSION:     1) 4 mm sessile polyp in  the cecum     2) Moderate diverticulosis in the sigmoid colon     3) Normal terminal ileum     4) Internal hemorrhoids           RECOMMENDATIONS:     1) Await pathology results     2) If the polyp removed today is adenomatous (pre-cancerous),     you will need a repeat colonoscopy in 5 years. Otherwise you     should continue to follow colorectal cancer screening guidelines     for "routine risk" patients with colonoscopy in 10 years.    3)     High fiber diet     4) Resume  Coumadin (warfarin) tomorrow     5) Out patient follow-up in 4-6 weeks.           Venita Lick. [name], MD, Clement[facility] Graham           CC: Pecola Lawless, MD           n.     Rosalie DoctorVenita Lick. Chesnee Floren at 06/19/2009 12:26 PM           Lawanda Cousins, [phone]  Note: An exclamation mark (!) indicates a result that was not dispersed into the flowsheet. Document Creation Date: 06/19/2009 12:27 PM _______________________________________________________________________  (1) Order result status: Final Collection or observation date-time: 06/19/2009 12:20 Requested date-time:  Receipt date-time:  Reported date-time:  Referring Physician:   Ordering Physician: Claudette Head [phone]) Specimen Source:  Source: Launa Grill Order Number: [id number] Lab site:   Appended Document: Colonoscopy colon 06/2014  Appended Document: Colonoscopy     Procedures Next Due Date:    Colonoscopy: 06/2014

## 2010-03-19 NOTE — Assessment & Plan Note (Signed)
Summary: CRESTOR GIVES HER ARM PAIN--WANTS TO CHANGE///SPH   Vital Signs:  Patient profile:   77 year old female Weight:      173 pounds Temp:     98.3 degrees F oral Pulse rate:   84 / minute Resp:     15 per minute BP sitting:   122 / 80  (left arm) Cuff size:   regular  Vitals Entered By: Shonna Chock CMA (October 19, 2009 2:50 PM) CC: Discuss chlosterol med-? reaction (arm pain), Lipid Management   Primary Care Provider:  Marga Melnick, MD  CC:  Discuss chlosterol med-? reaction (arm pain) and Lipid Management.  History of Present Illness: Hyperlipidemia Follow-Up      This is a 77 year old woman who presents for Hyperlipidemia follow-up. She had  bilateral upper  extremity pain on Crestor, but not with Vytorin. Crestor was Rxed due to potential Vytorin: Verapamil  interaction. Verapamil has been D/Ced since ICD insertion. The patient reports  chronic diarrhea since 10/2008( prior to Crestor Rx) which has been evaluauted in detail by Dr [name].She  denies GI upset, abdominal pain, flushing, itching, and constipation.  Other symptoms include pedal edema.  The patient denies the following symptoms: chest pain/pressure, exercise intolerance, dypsnea, palpitations, and syncope.  Dietary compliance has been good.  The patient reports exercising daily 5X/week.  Adjunctive measures currently used by the patient include fish oil supplements.  NMR v2005 was reviewed & risks discussed.  Lipid Management History:      Positive NCEP/ATP III risk factors include female age 5 years old or older, HDL cholesterol less than 40, and ASHD (either angina/prior MI/prior CABG).  Negative NCEP/ATP III risk factors include no history of early menopause without estrogen hormone replacement, non-diabetic, no family history for ischemic heart disease, non-tobacco-user status, non-hypertensive, no prior stroke/TIA, no peripheral vascular disease, and no history of aortic aneurysm.     Current Medications  (verified): 1)  Multivitamins  Tabs (Multiple Vitamin) .... Take 1 Tablet By Mouth Once A Day 2)  Synthroid 125 Mcg  Tabs (Levothyroxine Sodium) .... 1/2 Pill On  Mon,weds, Fri & Sun ; 1 Pill T,th,sat 3)  Vitamin D3 1000 Unit  Caps (Cholecalciferol) .Marland Kitchen.. 1 By Mouth Once Daily 4)  Calcium Carbonate 600 Mg Tabs (Calcium Carbonate) .... Take 1 Tablet Two Times A Day 5)  Coenzyme Q10 200 Mg Caps (Coenzyme Q10) .... Take 1 Tablet By Mouth Once A Day 6)  Salmon Oil  Caps (Nutritional Supplements) .... 1000mg  Daily 7)  Warfarin Sodium 5 Mg Tabs (Warfarin Sodium) .... Use As Directed By Anticoagulation Clinic 8)  Biotin 1000 Mcg Tabs (Biotin) .Marland Kitchen.. 1 By Mouth Once Daily 9)  Florastor 250 Mg Caps (Saccharomyces Boulardii) .Marland Kitchen.. 1 By Mouth Once Daily 10)  Furosemide 20 Mg Tabs (Furosemide) .... Take One Tablet By Mouth Every Other Day 11)  Levsin 0.125 Mg Tabs (Hyoscyamine Sulfate) .Marland Kitchen.. 1-2 Tablets By Mouth Before Meals Three Times A Day  Allergies: 1)  ! Colchicine 2)  ! Crestor (Rosuvastatin Calcium) 3)  Lamisil (Terbinafine Hcl) 4)  Erythromycin  Past History:  Past Medical History: cardiomyopathy-rate related. Status post AV junction ablation Status post pacemaker with CRT-D. grade([employer]tr[employer]) Hypothyroidism Thyroid nodules (goiter) 1990 Colonic Polyposis/negative colon 09/2000 TUBULAR ADENOMA 06/19/2009 diverticulosis C.diff 11/2006 Atrial fibrillation-neg stress cardilite 04/30/03 Gilbert's Syndrome 09/2003 Diverticulitis, hx of 2010 Hyperlipidemia: NMR Lipoprofile 2005: LDL 234( 3206/2234), HDL 37, TG 86. LDL goal = < 100.[place] Study LDL goal = < 130.  Review  of Systems MS:  Denies joint pain, joint redness, and joint swelling.  Physical Exam  General:  well-nourished,in no acute distress; alert,appropriate and cooperative throughout examination Lungs:  Normal respiratory effort, chest expands symmetrically. Lungs are clear to auscultation, no crackles or wheezes. Heart:  normal  rate, regular rhythm, no gallop, no rub, no JVD, no HJR, and grade 1 /6 systolic murmur.   Abdomen:  Bowel sounds positive,abdomen soft and non-tender without masses, organomegaly or hernias noted. No AAA or bruits Pulses:  R and L carotid,radial,dorsalis pedis and posterior tibial pulses are full and equal bilaterally Extremities:  No clubbing, cyanosis, edema. OAdigit changes Neurologic:  alert & oriented X3.   Psych:  memory intact for recent and remote, normally interactive, and good eye contact.  Focused & intelligent   Impression & Recommendations:  Problem # 1:  HYPERLIPIDEMIA (ICD-272.4)  Her updated medication list for this problem includes:    Vytorin 10-20 Mg Tabs (Ezetimibe-simvastatin) .Marland Kitchen... 1 at bedtime  Complete Medication List: 1)  Multivitamins Tabs (Multiple vitamin) .... Take 1 tablet by mouth once a day 2)  Synthroid 125 Mcg Tabs (Levothyroxine sodium) .... 1/2 pill on  mon,weds, fri & sun ; 1 pill t,th,sat 3)  Vitamin D3 1000 Unit Caps (Cholecalciferol) .Marland Kitchen.. 1 by mouth once daily 4)  Calcium Carbonate 600 Mg Tabs (Calcium carbonate) .... Take 1 tablet two times a day 5)  Coenzyme Q10 200 Mg Caps (Coenzyme q10) .... Take 1 tablet by mouth once a day 6)  Salmon Oil Caps (Nutritional supplements) .... 1000mg  daily 7)  Warfarin Sodium 5 Mg Tabs (Warfarin sodium) .... Use as directed by anticoagulation clinic 8)  Biotin 1000 Mcg Tabs (Biotin) .Marland Kitchen.. 1 by mouth once daily 9)  Florastor 250 Mg Caps (Saccharomyces boulardii) .Marland Kitchen.. 1 by mouth once daily 10)  Furosemide 20 Mg Tabs (Furosemide) .... Take one tablet by mouth every other day 11)  Levsin 0.125 Mg Tabs (Hyoscyamine sulfate) .Marland Kitchen.. 1-2 tablets by mouth before meals three times a day 12)  Vytorin 10-20 Mg Tabs (Ezetimibe-simvastatin) .Marland Kitchen.. 1 at bedtime  Lipid Assessment/Plan:      Based on NCEP/ATP III, the patient's risk factor category is "history of coronary disease, peripheral vascular disease, cerebrovascular  disease, or aortic aneurysm".  The patient's lipid goals are as follows: Total cholesterol goal is 200; LDL cholesterol goal is 130; HDL cholesterol goal is 40; Triglyceride goal is 150.  Her LDL cholesterol goal has not been met.  Secondary causes for hyperlipidemia have been ruled out.  She has been counseled on adjunctive measures for lowering her cholesterol and has been provided with dietary instructions.    Patient Instructions: 1)  Check PT/INR after 5 -7 days of Vytorin. 2)  Please schedule a follow-up appointment in 3 months. 3)  Hepatic Panel, CPK  prior to visit, ICD-9:995.20 4)  Lipid Panel prior to visit, ICD-9:272.4 Prescriptions: VYTORIN 10-20 MG TABS (EZETIMIBE-SIMVASTATIN) 1 at bedtime  #30 x 2   Entered and Authorized by:   Marga Melnick MD   Signed by:   Marga Melnick MD on 10/19/2009   Method used:   Print then Give to Patient   RxID:   [id number]

## 2010-03-19 NOTE — Letter (Signed)
Summary: Remote Device Check  [facility], Main Office  [address]   [place], [place] [address]   Phone: [phone]  Fax: [phone]     November 28, 2009 MRN: [id number]   [facility] [address] [place], [place]  [address]   Dear Ms. Syverson,   Your remote transmission was recieved and reviewed by your physician.  All diagnostics were within normal limits for you.   ___X___Your next office visit is scheduled for:   01-22-2010 @ 1430 with Dr Graciela Husbands.                               Sincerely,  Vella Kohler

## 2010-03-19 NOTE — Progress Notes (Signed)
Summary:  req call back  Phone Note Call from Patient Call back at Home Phone [phone]   Caller: Patient Reason for Call: Talk to Nurse Summary of Call: req return call from nurse Initial call taken by: Migdalia Dk,  March 12, 2009 4:24 PM  Follow-up for Phone Call        Pt is concerned that her device has moved even lower than where it was originally placed. Last OV, Dr. Graciela Husbands had talked about maybe getting an ECHO but she never heard whether she needed to do that. Wants to know if she should be concerned.  Follow-up by: Duncan Dull, RN, BSN,  March 12, 2009 5:36 PM  Additional Follow-up for Phone Call Additional follow up Details #1::        Dr. Graciela Husbands ask Amber to f/u with pt in checking her device via the phone (Optival). Duncan Dull, RN, BSN  March 15, 2009 6:03 PM Called patient and left message on machine to call back.  Optivol has returned to baseline.  Need to see how patient is feeling.  Per Dr Odessa Fleming last note, would get repeat echo if Optivol  had not normalized. Gypsy Balsam RN BSN  March 16, 2009 2:54 PM     Additional Follow-up for Phone Call Additional follow up Details #2::    Per Dr. Graciela Husbands, pt does not need anything further at this time. We will see her as scheduled.  Follow-up by: Duncan Dull, RN, BSN,  March 21, 2009 3:18 PM

## 2010-03-19 NOTE — Assessment & Plan Note (Signed)
Summary: device/saf   Visit Type:  Follow-up Primary Provider:  Marga Melnick, MD   History of Present Illness: Michele [name] is seen in followup for atrial fibrillation s/p AV ablation with A. 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  She comes in today feeling really very well The patient denies SOB, chest pain, edema or palpitations   Current Medications (verified): 1)  Multivitamins  Tabs (Multiple Vitamin) .... Take 1 Tablet By Mouth Once A Day 2)  Synthroid 125 Mcg  Tabs (Levothyroxine Sodium) .... 1/2 Pill On  Mon,weds, Fri & Sun ; 1 Pill T,th,sat 3)  Vitamin D3 1000 Unit  Caps (Cholecalciferol) .Marland Kitchen.. 1 By Mouth Once Daily 4)  Calcium Carbonate 600 Mg Tabs (Calcium Carbonate) .... Take 1 Tablet Two Times A Day 5)  Coenzyme Q10 200 Mg Caps (Coenzyme Q10) .... Take 1 Tablet By Mouth Once A Day 6)  Salmon Oil  Caps (Nutritional Supplements) .... 1000mg  Daily 7)  Warfarin Sodium 5 Mg Tabs (Warfarin Sodium) .... Use As Directed By Anticoagulation Clinic 8)  Biotin 1000 Mcg Tabs (Biotin) .Marland Kitchen.. 1 By Mouth Once Daily 9)  Florastor 250 Mg Caps (Saccharomyces Boulardii) .Marland Kitchen.. 1 By Mouth Once Daily 10)  Furosemide 20 Mg Tabs (Furosemide) .... Take One Tablet By Mouth Every Other Day  Allergies (verified): 1)  ! Colchicine 2)  Lamisil (Terbinafine Hcl) 3)  Erythromycin  Past History:  Past Medical History: Last updated: 03/20/2009 cardiomyopathy-rate related. Status post AV junction ablation Status post pacemaker with CRT-D. grade([employer]) Hypothyroidism 1990-Thyroid nodules (goiter) Colonic Polyposis/negative colon 8/02 diverticulosis c.diff 10/08 Atrial fibrillation-neg stress cardilite 04/30/03 8/05-Gilbert's Syndrome Diverticulitis, hx of 2010  Vital Signs:  Patient profile:   77 year old female Height:      63 inches Weight:      170 pounds Pulse rate:   74 / minute BP  sitting:   94 / 60  (left arm)  Vitals Entered By: Laurance Flatten CMA (August 01, 2009 1:37 PM)  Physical Exam  General:  The patient was alert and oriented in no acute distress. HEENT Normal.  Neck veins were flat, carotids were brisk.  Lungs were clear.  Heart sounds were regular without murmurs or gallops.  Abdomen was soft with active bowel sounds. There is no clubbing cyanosis or edema. Skin Warm and dry     ICD Specifications Following MD:  Sherryl Manges, MD     ICD Vendor:  [employer]     ICD Model Number:  [phone]     ICD Serial Number:  [id number] ICD DOI:  03/09/2008     ICD Implanting MD:  Sherryl Manges, MD  Lead 1:    Location: RA     DOI: 10/26/2006     Model #: 1478     Serial #: [id number]     Status: active Lead 2:    Location: RV     DOI: 10/26/2006     Model #: 5784     Serial #: [id number]     Status: active Lead 3:    Location: RV     DOI: 03/09/2008     Model #: 7120     Serial #: [id number]     Status: active Lead 4:    Location: LV     DOI: 03/09/2008     Model #: 4196     Serial #: [id number]     Status: active  Indications::  CHF AV node ablatiion  Explantation Comments: Pacemaker dependent  ICD Follow Up Battery Voltage:  3.11 V     Charge Time:  8.4 seconds     Underlying rhythm:  paced ICD Dependent:  Yes       ICD Device Measurements Atrium:  Amplitude: 2.3 mV, Impedance: 703 ohms,  Right Ventricle:  Amplitude: paced mV, Impedance: 646 ohms, Threshold: 0.50 V at 0.40 msec Left Ventricle:  Impedance: 684 ohms, Threshold: 0.75 V at 0.40 msec Configuration: LV TIP TO LV RING Shock Impedance: 50/63 ohms   Episodes MS Episodes:  1     Percent Mode Switch:  100%     Coumadin:  No Shock:  0     ATP:  0     Nonsustained:  0     Atrial Therapies:  0 Atrial Pacing:  <0.1%     Ventricular Pacing:  99.8%  Brady Parameters Mode DDIR     Lower Rate Limit:  70     Upper Rate Limit 120 PAV 140     Rate Response Parameters:  ADL response-3, Exertion response-3,  Threshold-Med/Low, Acceleration-30 seconds, Deceleration-Exercise  Tachy Zones VF:  188     VT:  OFF     Next Remote Date:  11/01/2009     Tech Comments:  Pt in AF 100% of time. + Coumadin.  Normal device function.  No changes made. Carelink check . Vella Kohler  August 01, 2009 1:55 PM  Impression & Recommendations:  Problem # 1:  CORONARY ARTERY DISEASE (ICD-414.00) STABLE Her updated medication list for this problem includes:    Warfarin Sodium 5 Mg Tabs (Warfarin sodium) ..... Use as directed by anticoagulation clinic  Problem # 2:  CARDIOMYOPATHY-TACHYCARDIA INDUCED RESOLVED (ICD-425.4) CONTINUE CURRENT MEDS Her updated medication list for this problem includes:    Warfarin Sodium 5 Mg Tabs (Warfarin sodium) ..... Use as directed by anticoagulation clinic    Furosemide 20 Mg Tabs (Furosemide) .Marland Kitchen... Take one tablet by mouth every other day  Problem # 3:  AV BLOCK,S/P AV ABLATION (ICD-426.0) DEVICE DEPENDENT Her updated medication list for this problem includes:    Warfarin Sodium 5 Mg Tabs (Warfarin sodium) ..... Use as directed by anticoagulation clinic  Problem # 4:  AUTOMATIC IMPLANTABLE CARDIAC DEFIBRILLATOR SITU (ICD-V45.02) Device parameters and data were reviewed and no changes were made  Patient Instructions: 1)  Your physician wants you to follow-up in:  6 months with Dr Graciela Husbands. You will receive a reminder letter in the mail two months in advance. If you don't receive a letter, please call our office to schedule the follow-up appointment.

## 2010-03-21 NOTE — Medication Information (Signed)
Summary: rov/tm  Anticoagulant Therapy  Managed by: Weston Brass, PharmD Referring MD: Valera Castle MD PCP: Marga Melnick, MD Supervising MD: Graciela Husbands MD, Viviann Spare Indication 1: Atrial Fibrillation (ICD-427.31) Lab Used: [facility] Site: [facility] INR POC 2.6 INR RANGE 2 - 3  Dietary changes: no    Health status changes: no    Bleeding/hemorrhagic complications: no    Recent/future hospitalizations: no    Any changes in medication regimen? no    Recent/future dental: no  Any missed doses?: no       Is patient compliant with meds? yes       Current Medications (verified): 1)  Multivitamins  Tabs (Multiple Vitamin) .... Take 1 Tablet By Mouth Once A Day 2)  Synthroid 125 Mcg  Tabs (Levothyroxine Sodium) .... 1/2 Pill On  Mon,weds, Fri & Sun ; 1 Pill T,th,sat 3)  Vitamin D3 1000 Unit  Caps (Cholecalciferol) .Marland Kitchen.. 1 By Mouth Once Daily 4)  Calcium Carbonate 600 Mg Tabs (Calcium Carbonate) .... Take 1 Tablet Two Times A Day 5)  Coenzyme Q10 200 Mg Caps (Coenzyme Q10) .... Take 1 Tablet By Mouth Once A Day 6)  Salmon Oil  Caps (Nutritional Supplements) .... 1000mg  Daily 7)  Warfarin Sodium 5 Mg Tabs (Warfarin Sodium) .... Use As Directed By Anticoagulation Clinic 8)  Biotin 1000 Mcg Tabs (Biotin) .Marland Kitchen.. 1 By Mouth Once Daily 9)  Florastor 250 Mg Caps (Saccharomyces Boulardii) .Marland Kitchen.. 1 By Mouth Once Daily 10)  Furosemide 20 Mg Tabs (Furosemide) .... Take One Tablet By Mouth Every Other Day 11)  Vancocin Hcl 125 Mg Caps (Vancomycin Hcl) .... Take 1 Cap Po Qid X 2 Weeks, Then To 1 Cap Po Bid X 1 Week,  Then To 1 Cap Po Qd X 1 Week, Then To 1 Cap By Mouth To Every Other Day X 1 Week Then D/c  Allergies (verified): 1)  ! Colchicine 2)  ! Crestor (Rosuvastatin Calcium) 3)  ! Keflex 4)  Lamisil (Terbinafine Hcl) 5)  Erythromycin  Anticoagulation Management History:      The patient is taking warfarin and comes in today for a routine follow up visit.  Positive risk factors for bleeding  include an age of 23 years or older.  Negative risk factors for bleeding include no history of CVA/TIA.  The bleeding index is 'intermediate risk'.  Positive CHADS2 values include Age > 48 years old.  Negative CHADS2 values include History of HTN, History of Diabetes, and Prior Stroke/CVA/TIA.  The start date was 04/24/2001.  Her last INR was 1.8.  Anticoagulation responsible provider: Graciela Husbands MD, Viviann Spare.  INR POC: 2.6.  Cuvette Lot#: [id number].  Exp: 02/2011.    Anticoagulation Management Assessment/Plan:      The patient's current anticoagulation dose is Warfarin sodium 5 mg tabs: Use as directed by Anticoagulation Clinic.  The target INR is 2 - 3.  The next INR is due 04/03/2010.  Anticoagulation instructions were given to patient.  Results were reviewed/authorized by Weston Brass, PharmD.  She was notified by Stephannie Peters, PharmD Candidate .         Prior Anticoagulation Instructions: INR 2.6 Continue 1 pill everyday except 1/2 pill on Mondays. Recheck in 2 weeks.   Current Anticoagulation Instructions: INR 2.6  Coumadin 5 mg tablets - Continue 1 tablet every day except 1/2 tablet on Mondays

## 2010-03-21 NOTE — Medication Information (Signed)
Summary: rov/tm  Anticoagulant Therapy  Managed by: Samantha Crimes, PharmD Referring MD: Valera Castle MD PCP: Marga Melnick, MD Supervising MD: Graciela Husbands MD, Viviann Spare Indication 1: Atrial Fibrillation (ICD-427.31) Lab Used: [facility] Site: [facility] INR POC 4.5 INR RANGE 2 - 3  Dietary changes: yes       Details: holidays - some etoh   Health status changes: no    Bleeding/hemorrhagic complications: no    Recent/future hospitalizations: no    Any changes in medication regimen? no    Recent/future dental: no  Any missed doses?: no       Is patient compliant with meds? yes       Current Medications (verified): 1)  Multivitamins  Tabs (Multiple Vitamin) .... Take 1 Tablet By Mouth Once A Day 2)  Synthroid 125 Mcg  Tabs (Levothyroxine Sodium) .... 1/2 Pill On  Mon,weds, Fri & Sun ; 1 Pill T,th,sat 3)  Vitamin D3 1000 Unit  Caps (Cholecalciferol) .Marland Kitchen.. 1 By Mouth Once Daily 4)  Calcium Carbonate 600 Mg Tabs (Calcium Carbonate) .... Take 1 Tablet Two Times A Day 5)  Coenzyme Q10 200 Mg Caps (Coenzyme Q10) .... Take 1 Tablet By Mouth Once A Day 6)  Salmon Oil  Caps (Nutritional Supplements) .... 1000mg  Daily 7)  Warfarin Sodium 5 Mg Tabs (Warfarin Sodium) .... Use As Directed By Anticoagulation Clinic 8)  Biotin 1000 Mcg Tabs (Biotin) .Marland Kitchen.. 1 By Mouth Once Daily 9)  Florastor 250 Mg Caps (Saccharomyces Boulardii) .Marland Kitchen.. 1 By Mouth Once Daily 10)  Furosemide 20 Mg Tabs (Furosemide) .... Take One Tablet By Mouth Every Other Day 11)  Vancocin Hcl 125 Mg Caps (Vancomycin Hcl) .... Take 1 Cap Po Qid X 2 Weeks, Then To 1 Cap Po Bid X 1 Week,  Then To 1 Cap Po Qd X 1 Week, Then To 1 Cap By Mouth To Every Other Day X 1 Week Then D/c  Allergies (verified): 1)  ! Colchicine 2)  ! Crestor (Rosuvastatin Calcium) 3)  ! Keflex 4)  Lamisil (Terbinafine Hcl) 5)  Erythromycin  Anticoagulation Management History:      Positive risk factors for bleeding include an age of 75 years or older.   Negative risk factors for bleeding include no history of CVA/TIA.  The bleeding index is 'intermediate risk'.  Positive CHADS2 values include Age > 81 years old.  Negative CHADS2 values include History of HTN, History of Diabetes, and Prior Stroke/CVA/TIA.  The start date was 04/24/2001.  Her last INR was 1.8.  Anticoagulation responsible provider: Graciela Husbands MD, Viviann Spare.  INR POC: 4.5.  Exp: 11/2010.    Anticoagulation Management Assessment/Plan:      The patient's current anticoagulation dose is Warfarin sodium 5 mg tabs: Use as directed by Anticoagulation Clinic.  The target INR is 2 - 3.  The next INR is due 02/19/2010.  Anticoagulation instructions were given to patient.  Results were reviewed/authorized by Samantha Crimes, PharmD.         Prior Anticoagulation Instructions: INR 3.3 Skip today's dose then resume 5mg s everyday. Recheck in 3 weeks.   Current Anticoagulation Instructions: Hold today and tomorrow (Wednesday) doses then start 5mg  daily except for monday take 1/2 tab (2.5 mg) Return to clinic on Jan 4th, at 230 pm

## 2010-03-21 NOTE — Medication Information (Signed)
Summary: Michele [name]  Anticoagulant Therapy  Managed by: Bethena Midget, RN, BSN Referring MD: Valera Castle MD PCP: Marga Melnick, MD Supervising MD: Antoine Poche MD, Fayrene Fearing Indication 1: Atrial Fibrillation (ICD-427.31) Lab Used: [facility] Site: [facility] INR POC 2.6 INR RANGE 2 - 3  Dietary changes: no    Health status changes: no    Bleeding/hemorrhagic complications: no    Recent/future hospitalizations: no    Any changes in medication regimen? no    Recent/future dental: no  Any missed doses?: no       Is patient compliant with meds? yes       Allergies: 1)  ! Colchicine 2)  ! Crestor (Rosuvastatin Calcium) 3)  ! Keflex 4)  Lamisil (Terbinafine Hcl) 5)  Erythromycin  Anticoagulation Management History:      The patient is taking warfarin and comes in today for a routine follow up visit.  Positive risk factors for bleeding include an age of 75 years or older.  Negative risk factors for bleeding include no history of CVA/TIA.  The bleeding index is 'intermediate risk'.  Positive CHADS2 values include Age > 75 years old.  Negative CHADS2 values include History of HTN, History of Diabetes, and Prior Stroke/CVA/TIA.  The start date was 04/24/2001.  Her last INR was 1.8.  Anticoagulation responsible provider: Antoine Poche MD, Fayrene Fearing.  INR POC: 2.6.  Cuvette Lot#: [id number].  Exp: 03/2011.    Anticoagulation Management Assessment/Plan:      The patient's current anticoagulation dose is Warfarin sodium 5 mg tabs: Use as directed by Anticoagulation Clinic.  The target INR is 2 - 3.  The next INR is due 03/06/2010.  Anticoagulation instructions were given to patient.  Results were reviewed/authorized by Bethena Midget, RN, BSN.  She was notified by Bethena Midget, RN, BSN.         Prior Anticoagulation Instructions: Hold today and tomorrow (Wednesday) doses then start 5mg  daily except for monday take 1/2 tab (2.5 mg) Return to clinic on Jan 4th, at 230 pm  Current Anticoagulation  Instructions: INR 2.6 Continue 1 pill everyday except 1/2 pill on Mondays. Recheck in 2 weeks.

## 2010-04-02 ENCOUNTER — Encounter: Payer: Self-pay | Admitting: Cardiovascular Disease

## 2010-04-02 ENCOUNTER — Encounter (INDEPENDENT_AMBULATORY_CARE_PROVIDER_SITE_OTHER): Payer: Medicare Other

## 2010-04-02 DIAGNOSIS — Z7901 Long term (current) use of anticoagulants: Secondary | ICD-10-CM

## 2010-04-02 DIAGNOSIS — I4891 Unspecified atrial fibrillation: Secondary | ICD-10-CM

## 2010-04-10 NOTE — Medication Information (Signed)
Summary: [facility]  Anticoagulant Therapy  Managed by: Cloyde Reams, RN, BSN Referring MD: Valera Castle MD PCP: Marga Melnick, MD Supervising MD: Eden Emms MD, Theron Arista Indication 1: Atrial Fibrillation (ICD-427.31) Lab Used: [facility] Site: [facility] INR POC 2.5 INR RANGE 2 - 3  Dietary changes: no    Health status changes: no    Bleeding/hemorrhagic complications: no    Recent/future hospitalizations: no    Any changes in medication regimen? yes       Details: Completed abx 2 weeks ago for ear infection.    Recent/future dental: no  Any missed doses?: no       Is patient compliant with meds? yes       Allergies: 1)  ! Colchicine 2)  ! Crestor (Rosuvastatin Calcium) 3)  ! Keflex 4)  Lamisil (Terbinafine Hcl) 5)  Erythromycin  Anticoagulation Management History:      The patient is taking warfarin and comes in today for a routine follow up visit.  Positive risk factors for bleeding include an age of 42 years or older.  Negative risk factors for bleeding include no history of CVA/TIA.  The bleeding index is 'intermediate risk'.  Positive CHADS2 values include Age > 16 years old.  Negative CHADS2 values include History of HTN, History of Diabetes, and Prior Stroke/CVA/TIA.  The start date was 04/24/2001.  Her last INR was 1.8.  Anticoagulation responsible provider: Eden Emms MD, Theron Arista.  INR POC: 2.5.  Cuvette Lot#: [id number].  Exp: 02/2011.    Anticoagulation Management Assessment/Plan:      The patient's current anticoagulation dose is Warfarin sodium 5 mg tabs: Use as directed by Anticoagulation Clinic.  The target INR is 2 - 3.  The next INR is due 04/30/2010.  Anticoagulation instructions were given to patient.  Results were reviewed/authorized by Cloyde Reams, RN, BSN.  She was notified by Cloyde Reams RN.         Prior Anticoagulation Instructions: INR 2.6  Coumadin 5 mg tablets - Continue 1 tablet every day except 1/2 tablet on Mondays   Current Anticoagulation  Instructions: INR 2.5  Continue on same dosage 1 tablet daily except 1/2 tablet on Mondays.  Recheck in 4 weeks.

## 2010-04-25 ENCOUNTER — Encounter: Payer: Self-pay | Admitting: Internal Medicine

## 2010-04-25 ENCOUNTER — Encounter (INDEPENDENT_AMBULATORY_CARE_PROVIDER_SITE_OTHER): Payer: Medicare Other

## 2010-04-25 DIAGNOSIS — I4891 Unspecified atrial fibrillation: Secondary | ICD-10-CM

## 2010-04-25 DIAGNOSIS — I428 Other cardiomyopathies: Secondary | ICD-10-CM

## 2010-04-25 DIAGNOSIS — I509 Heart failure, unspecified: Secondary | ICD-10-CM

## 2010-04-30 ENCOUNTER — Encounter (INDEPENDENT_AMBULATORY_CARE_PROVIDER_SITE_OTHER): Payer: Medicare Other

## 2010-04-30 ENCOUNTER — Encounter: Payer: Self-pay | Admitting: Cardiovascular Disease

## 2010-04-30 DIAGNOSIS — I4891 Unspecified atrial fibrillation: Secondary | ICD-10-CM

## 2010-04-30 DIAGNOSIS — Z7901 Long term (current) use of anticoagulants: Secondary | ICD-10-CM

## 2010-05-01 LAB — URINALYSIS, ROUTINE W REFLEX MICROSCOPIC
Nitrite: POSITIVE — AB
Specific Gravity, Urine: 1.008 (ref 1.005–1.030)
Urobilinogen, UA: 0.2 mg/dL (ref 0.0–1.0)
pH: 7.5 (ref 5.0–8.0)

## 2010-05-01 LAB — CBC
HCT: 40.6 % (ref 36.0–46.0)
Hemoglobin: 14 g/dL (ref 12.0–15.0)
MCH: 30.8 pg (ref 26.0–34.0)
MCV: 89.2 fL (ref 78.0–100.0)
RBC: 4.55 MIL/uL (ref 3.87–5.11)

## 2010-05-01 LAB — DIFFERENTIAL
Basophils Absolute: 0 10*3/uL (ref 0.0–0.1)
Basophils Relative: 0 % (ref 0–1)
Lymphocytes Relative: 31 % (ref 12–46)
Neutro Abs: 4.3 10*3/uL (ref 1.7–7.7)

## 2010-05-01 LAB — COMPREHENSIVE METABOLIC PANEL
BUN: 14 mg/dL (ref 6–23)
CO2: 24 mEq/L (ref 19–32)
Chloride: 109 mEq/L (ref 96–112)
Creatinine, Ser: 0.97 mg/dL (ref 0.4–1.2)
GFR calc non Af Amer: 56 mL/min — ABNORMAL LOW (ref >60)
Glucose, Bld: 106 mg/dL — ABNORMAL HIGH (ref 70–99)
Total Bilirubin: 0.6 mg/dL (ref 0.3–1.2)

## 2010-05-01 LAB — URINE MICROSCOPIC-ADD ON

## 2010-05-01 LAB — PROTIME-INR: INR: 2.51 — ABNORMAL HIGH (ref 0.00–1.49)

## 2010-05-01 LAB — URINE CULTURE: Culture: NO GROWTH

## 2010-05-01 LAB — APTT: aPTT: 68 seconds — ABNORMAL HIGH (ref 24–37)

## 2010-05-02 ENCOUNTER — Encounter: Payer: Self-pay | Admitting: *Deleted

## 2010-05-07 NOTE — Letter (Signed)
Summary: Remote Device Check  [employer], Main Office  [address]   [place], [place] [address]   Phone: [phone]  Fax: [phone]     May 02, 2010 MRN: [id number]   [facility] [address] [place], [place]  [address]   Dear Ms. Pless,   Your remote transmission was recieved and reviewed by your physician.  All diagnostics were within normal limits for you.  ___X__Your next transmission is scheduled for: 07/25/10  Please transmit at any time this day.  If you have a wireless device your transmission will be sent automatically.  ______Your next office visit is scheduled for:                              . Please call our office to schedule an appointment.    Sincerely,  Altha Harm, LPN

## 2010-05-07 NOTE — Medication Information (Signed)
Summary: rov/ewj  Anticoagulant Therapy  Managed by: Bayard Hugger, PharmD Referring MD: Valera Castle MD PCP: Marga Melnick, MD Supervising MD: Clifton James MD, Cristal Deer Indication 1: Atrial Fibrillation (ICD-427.31) Lab Used: [facility] Site: [facility] INR POC 2.0 INR RANGE 2 - 3  Dietary changes: no    Health status changes: no    Bleeding/hemorrhagic complications: no    Recent/future hospitalizations: no    Any changes in medication regimen? no    Recent/future dental: no  Any missed doses?: no       Is patient compliant with meds? yes       Allergies: 1)  ! Colchicine 2)  ! Crestor (Rosuvastatin Calcium) 3)  ! Keflex 4)  Lamisil (Terbinafine Hcl) 5)  Erythromycin  Anticoagulation Management History:      Positive risk factors for bleeding include an age of 50 years or older.  Negative risk factors for bleeding include no history of CVA/TIA.  The bleeding index is 'intermediate risk'.  Positive CHADS2 values include Age > 40 years old.  Negative CHADS2 values include History of HTN, History of Diabetes, and Prior Stroke/CVA/TIA.  The start date was 04/24/2001.  Her last INR was 1.8.  Anticoagulation responsible provider: Clifton James MD, Cristal Deer.  INR POC: 2.0.  Cuvette Lot#: [id number].  Exp: 04/2011.    Anticoagulation Management Assessment/Plan:      The patient's current anticoagulation dose is Warfarin sodium 5 mg tabs: Use as directed by Anticoagulation Clinic.  The target INR is 2 - 3.  The next INR is due 05/28/2010.  Anticoagulation instructions were given to patient.  Results were reviewed/authorized by Bayard Hugger, PharmD.         Prior Anticoagulation Instructions: INR 2.5  Continue on same dosage 1 tablet daily except 1/2 tablet on Mondays.  Recheck in 4 weeks.    Current Anticoagulation Instructions: INR 2.0  The patient is to continue with the same dose of coumadin.  This dosage includes:  1 tablet every day except for Monday take 1/2 tablet Recheck INR  in 4 weeks

## 2010-05-16 NOTE — Cardiovascular Report (Signed)
Summary: Office Visit   Office Visit   Imported By: Roderic Ovens 05/07/2010 09:13:19  _____________________________________________________________________  External Attachment:    Type:   Image     Comment:   External Document

## 2010-05-27 ENCOUNTER — Telehealth: Payer: Self-pay | Admitting: Internal Medicine

## 2010-05-27 NOTE — Telephone Encounter (Signed)
Per husband wants checked - need lab order ---- schedule at [facility]

## 2010-05-27 NOTE — Telephone Encounter (Signed)
TSH 244.9, please add to lab order

## 2010-05-27 NOTE — Telephone Encounter (Signed)
Patient wants to have tsh level checked tomorrow - last tsh was 11.02.11

## 2010-05-28 ENCOUNTER — Ambulatory Visit (INDEPENDENT_AMBULATORY_CARE_PROVIDER_SITE_OTHER): Payer: Medicare Other | Admitting: *Deleted

## 2010-05-28 DIAGNOSIS — I4891 Unspecified atrial fibrillation: Secondary | ICD-10-CM

## 2010-05-28 DIAGNOSIS — Z7901 Long term (current) use of anticoagulants: Secondary | ICD-10-CM

## 2010-05-31 ENCOUNTER — Telehealth: Payer: Self-pay | Admitting: Internal Medicine

## 2010-05-31 ENCOUNTER — Other Ambulatory Visit: Payer: Self-pay | Admitting: Internal Medicine

## 2010-05-31 NOTE — Telephone Encounter (Signed)
REFILL RX

## 2010-05-31 NOTE — Telephone Encounter (Signed)
Spoke with patient husband scheduled tsh at [facility] office [phone]

## 2010-06-03 ENCOUNTER — Other Ambulatory Visit: Payer: Medicare Other

## 2010-06-03 ENCOUNTER — Other Ambulatory Visit (INDEPENDENT_AMBULATORY_CARE_PROVIDER_SITE_OTHER): Payer: Medicare Other

## 2010-06-03 DIAGNOSIS — E039 Hypothyroidism, unspecified: Secondary | ICD-10-CM

## 2010-06-03 LAB — PROTIME-INR
INR: 2.7 — ABNORMAL HIGH (ref 0.00–1.49)
INR: 2.7 — ABNORMAL HIGH (ref 0.00–1.49)
INR: 3.1 — ABNORMAL HIGH (ref 0.00–1.49)
Prothrombin Time: 30.8 seconds — ABNORMAL HIGH (ref 11.6–15.2)
Prothrombin Time: 34.8 seconds — ABNORMAL HIGH (ref 11.6–15.2)

## 2010-06-12 ENCOUNTER — Encounter: Payer: Self-pay | Admitting: Internal Medicine

## 2010-06-12 ENCOUNTER — Ambulatory Visit (INDEPENDENT_AMBULATORY_CARE_PROVIDER_SITE_OTHER): Payer: Medicare Other | Admitting: Internal Medicine

## 2010-06-12 DIAGNOSIS — E039 Hypothyroidism, unspecified: Secondary | ICD-10-CM

## 2010-06-12 DIAGNOSIS — R5383 Other fatigue: Secondary | ICD-10-CM

## 2010-06-12 DIAGNOSIS — R5381 Other malaise: Secondary | ICD-10-CM

## 2010-06-12 MED ORDER — LEVOTHYROXINE SODIUM 125 MCG PO TABS: 125.0000 ug | ORAL_TABLET | ORAL | Status: DC

## 2010-06-12 NOTE — Progress Notes (Signed)
Subjective:    Patient ID: Michele [name], female    DOB: 07/04/1929, 77 y.o.   MRN: [id number]  HPI Thyroid monitor:Change in dose, brand or mode of administration: no;                                                Constitutional: weight change : weight down 8#; significant fatigue:"I feel slow" ; sleep pattern: her husband's use CPAP disturbs her sleep; anorexia:no                                                                          Eyes: vision change(blurred/diplopia/loss): lane markers seem to  go from double to single line when riding                                                                                                                                                             ENT/Mouth: hoarseness: no; swallowing issues: no                                                                              CV: palpitations: no; racing: no ; irregularity:no ( Note: S/P ICD)                                                                  GI: constipation: no; diarrhea: Vancomycin corrected diarrhea  Due to C. difficle  Derm: change in nails/ skin/hair: nails split                                                                                           Neuro: numbness /tingling:no; tremor:no                                                                                           Psych: anxiety: no; depression: no ; panic attacks: no                                                                                 Endo: temperature intolerance:heat: yes ; cold : no    Review of Systems     Objective:   Physical Exam on exam she appears healthy and in no acute distress. She appears younger than her stated age.  Extraocular motions intact; she has no lid lag or proptosis. Thyroid is not palpable She has no lymphadenopathy about the head neck or axilla She has a slow S4 with a regular rhythm  Chest is  clear to auscultation; she has no rales, rhonchi, or wheezes  The tendon reflexes are 1-1/2+ and brisk. She has no tremor. There is no need for lysis.  Affect and mood are  normal.        Assessment & Plan:  #1 fatigue, multifactorial.  #2 hypothyroidism following radioactive iodine ablation for thyroid. Her TSH suggests slight overcorrection  #3 sleep disorder; her husband uses CPAP and this  appears to be affecting her sleep  which can be a major problem  component  of the fatigue.  Plan: #1 she should decrease your Synthroid 125 mcg  To 1 pill Tuesday Thursday Saturday and a 1/2pill  Monday, Wednesday, Friday, and Sunday. The TSH should be rechecked after 10-12 weeks.  #2 I would encourage her to sleep in a different bedroom over the next 4-6 weeks to see if this can improve the fatigue. I am worried that her sleep cycle is significantly disrupted.

## 2010-06-12 NOTE — Patient Instructions (Signed)
Please sleep in a separate bedroom for the next 4-6 weeks to see if this can improve the fatigue pattern. As we discussed your thyroid dose will be decreased. You'll take a half a pill Monday, Wednesday, Friday, and Sunday and  take a whole pill on Tuesday, Thursday, and Saturday. Please check at [facility] after 8-10 weeks.

## 2010-06-25 ENCOUNTER — Ambulatory Visit (INDEPENDENT_AMBULATORY_CARE_PROVIDER_SITE_OTHER): Payer: Medicare Other | Admitting: *Deleted

## 2010-06-25 DIAGNOSIS — I4891 Unspecified atrial fibrillation: Secondary | ICD-10-CM

## 2010-07-02 NOTE — Discharge Summary (Signed)
NAMEDENNY, Michele [name]   MEDICAL RECORD NO.:  [id number]          PATIENT TYPE:  OIB   LOCATION:  4736                         FACILITY:  [facility]   PHYSICIAN:  Michele Salvia, MD, FACCDATE OF BIRTH:  May 15, 1929   DATE OF ADMISSION:  10/26/2006  DATE OF DISCHARGE:  10/27/2006                               DISCHARGE SUMMARY   ALLERGIES:  This patient has allergies to LAMISIL and ERYTHROMYCIN.   DICTATION TIME AND EXAM:  Greater than 35 minutes.   FINAL DIAGNOSES:  1. Recurrent atrial fibrillation with rapid ventricular rate, having      previously failed direct current cardioversion.  2. Decline in ejection fraction, possibly secondary to tachycardia-      mediated cardiomyopathy.      a.     Myoview study 2005, ejection fraction 53%.      b.     Echocardiogram August 04, 2006, ejection fraction 35-40%.   SECONDARY DIAGNOSES:  1. Nonischemic cardiomyopathy, possibly tachycardia mediated.  2. Treated hypothyroidism.  3. Dyslipidemia.  4. Chronic Coumadin for atrial fibrillation.   PROCEDURE:  October 26, 2006, implant of Medtronic ADAPTA dual-chamber  pacemaker, Dr. Sherryl Manges   BRIEF HISTORY:  Michele [name] is a 77 year old female.  She has an atrial  fibrillation.  She has failed cardioversion. The potential use of  antiarrhythmics have been discussed.  The patient has recurrent atrial  fibrillation with rapid ventricular response.  She has progressive  deterioration in her systolic function, most recently recorded in the  mid 40s.  She had a nonischemic Myoview a couple of years ago.   The patient has progressive left ventricular dysfunction in a state of  rapid atrial fibrillation.  The patient is planned to be admitted to the  hospital with implant of pacemaker. Efforts will be aimed at reversing  myopathy. Cardioverter will not be implanted. Dual-chamber pacemaker  utilized. The patient then will begin on Coreg as well as Toprol. At  some  point she will have an ACE-I inhibitor after up titration and  stabilization on Coreg.  She will also need followup 2-D echocardiogram  sometime in the future.   HOSPITAL COURSE:  The patient presented electively September 8. She  underwent implantation of dual-chamber pacemaker described above. Has  had no postprocedural complications.  No hematoma.  Chest x-ray shows  wires are in appropriate position.  No pneumothorax.  The device has  been interrogated after implant. All values within normal limits.  The  patient is asked to keep her incision dry for the next 7 days and just  sponge bathe until Monday, September 15.   DISCHARGE MEDICATIONS:  1. Metoprolol succinate 50 mg daily, new medication.  2. Coreg 3.125 mg twice daily, new medication and with need to up      titrate in the future.  3. Levothyroxine 75 mcg daily.  4. Vytorin 10/80 daily at bedtime.  5. Coumadin as before this procedure.   She has followup with [facility] at [address]  1. Pacer clinic Wednesday, September 24, at 9:20 a.m.  2. To  see physician assistant Thursday, October 2, at 2:45 p.m. Coreg      will be up titrated at that time based on heart rate and blood      pressure.  3. To see Dr. Graciela [name] Thursday, December 11, at 9:15 in the morning.      Also once again the patient will need to start on ACE-I inhibitor.      She will also need followup 2-D echocardiogram sometime in a 3-      month to 61-month period after this discharge.   LABORATORY STUDIES PERTINENT TO THIS ADMISSION:  Hemoglobin was 14,  hematocrit 39.8, white cells 5.8, platelets of 187. The creatinine was  0.9. Pro time 22.9, INR 2.0.  Blood pressure this admission at the time  of discharge was 121/86, heart rate in the 80s.      Maple Mirza, [place]      Michele Salvia, MD, [facility]  Electronically Signed    GM/MEDQ  D:  10/27/2006  T:  10/27/2006  Job:  [id number]   cc:   Rollene Rotunda, MD, [facility]

## 2010-07-02 NOTE — Assessment & Plan Note (Signed)
[facility]                         ELECTROPHYSIOLOGY OFFICE NOTE   ELPIDIA, KARN                     MRN:          [id number]  DATE:03/01/2007                            DOB:          May 14, 1929    Michele [name] is seen following pacemaker implantation for bradycardia in  the setting of atrial fibrillation with a rapid ventricular response.  We have been trying to up titrate her medications to get control of her  rapid rate.  While she is feeling better, interrogation of her pacemaker  demonstrates that still 35 or 40% of her beats are faster than 100 beats  per minute.  This is despite bisoprolol 2.5 b.i.d. and verapamil 180  b.i.d., for which she thinks it is contributing to her dizziness.   PHYSICAL EXAMINATION:  VITAL SIGNS:  Her blood pressure today was 128/70  with a pulse of 89.  LUNGS:  Clear.  HEART:  The heart sounds were regular.  EXTREMITIES:  Without edema.   IMPRESSION:  1. Atrial fibrillation with a rapid ventricular response.  2. Propensity to bradycardia.  3. Left ventricular dysfunction, question related to rate.   I remain concerned about her tachycardia and her inability to control  it.  We will plan to discontinue the verapamil and switch it out for  Diltiazem 180 b.i.d. and discontinue her bisoprolol and try atenolol 50  and then Inderal 80.  Will plan to check her electrograms in about 4  weeks' time in the device clinic and will reassess what we do from  there.     Duke Salvia, MD, [facility]  Electronically Signed    SCK/MedQ  DD: 03/01/2007  DT: 03/02/2007  Job #: [id number]

## 2010-07-02 NOTE — Assessment & Plan Note (Signed)
[facility]                         ELECTROPHYSIOLOGY OFFICE NOTE   TAILOR, LUCKING                     MRN:          [id number]  DATE:12/08/2007                            DOB:          1930/02/16    Michele [name] comes in after a 72-month hiatus.  Her major complaint is  bloating which is described as abdominal bloating as well as peripheral  edema.  She has recently had an episode of diverticulitis which is  associated with diarrhea and concurrent with that was improvement in her  peripheral edema which is I can see a major complaint.   She has atrial fibrillation which is largely persistent with poorly  controlled ventricular response.  She has been intolerant to beta  blocker.  She has been reluctant to take antiarrhythmic drugs, although  she did fail flecainide previously.  She has a history of bradycardia  and her atrial fibrillation rates have been fast 20-30% faster than 100  beats per minute.  Unfortunately, she has also had progressive LV  dysfunction, so that her assessment last year demonstrated ejection  fraction of 35-40%, down from 15% in August 2005 somewhat progressively.   Her medications currently include Synthroid 125 mcg, Crestor, verapamil  180 b.i.d., and Coumadin.   On examination, her blood pressure was 110/70, her pulse was 64 at rest.  Her neck veins were 8-9 cm.  Her lungs were clear.  Her heart sounds  were irregular and the extremities had just trace edema.   Interrogation of her Medtronic adaptive pulse generator demonstrated  atrial fibrillation as being persistent.  The atrial rates were rapid as  noted previously.  The battery voltage was 2.78 with an estimated  longevity of 8-1/2 years.  Atrial impedance was 553 and ventricular  impedance was 568.   IMPRESSION:  1. Peripheral edema and bloating.  2. Atrial fibrillation with a poorly controlled ventricular response.  3. Nonischemic cardiomyopathy with  ejection fraction of 55% in 2005 to      35% in 2008, question related to the above.  4. Intolerance of beta blockers and reluctance to use amiodarone or      Tikosyn.   DISCUSSION:  Michele [name] has atrial fibrillation with a rapid ventricular  response.  She also has significant edema which may be related to the  above or may be related to the verapamil.  We have chosen to pursue the  following course.  We will;  1. Discontinue her verapamil for 2-3 weeks and see whether the edema      and the bloating resolves.  2. We will initiate her on Lanoxin therapy at 0.125 mg a day.  3. We will reassess things in 3-4 weeks with an echo at that time with      the ejection fraction potentially having a large impact on the      urgency with which a plan needs to be pursued to control her rate.      Options at that time would include;      a.     Antiarrhythmic drugs.      b.  Atrioventricular junction ablation.      c.     Referral for catheter ablation.   I also need to go back through her chart and see if we can find out what  the problem was many many years ago with beta blockers.     Duke Salvia, MD, [facility]  Electronically Signed    SCK/MedQ  DD: 12/08/2007  DT: 12/09/2007  Job #: [id number]

## 2010-07-02 NOTE — Assessment & Plan Note (Signed)
[facility]                         GASTROENTEROLOGY OFFICE NOTE   Michele [name], Michele [name]                     MRN:          [id number]  DATE:12/16/2006                            DOB:          [birth year]    This is a return office visit for C. difficile colitis.  She was treated  with antibiotics recently for acute sigmoid colon diverticulitis, which  has resolved.  Please see the recent note by Mike Gip dated December 03, 2006.  She is on day 13/14 of Vancomycin.  Her diarrhea has  substantially improved.  She is now having 4 to 5 loose to semiformed  bowel movements a day generally following meals, which is improved from  8 to 10 loose watery bowel movements a day 13 days ago.  Her appetite is  good, and her weight is stable.   CURRENT MEDICATIONS:  Listed on the chart, updated and reviewed.   MEDICATION ALLERGIES:  1. LAMISIL.  2. ERYTHROMYCIN.   PHYSICAL EXAMINATION:  In no acute distress.  Weight 174.6 pounds.  Blood pressure 102/60.  Pulse 88 and regular.  CHEST:  Clear to auscultation bilaterally.  CARDIAC:  Regular rate and rhythm without murmurs.  ABDOMEN:  Soft and nontender with normoactive bowel sounds.   ASSESSMENT AND PLAN:  Resolving Clostridium difficile colitis.  Complete  course of Vancomycin.  Change Florastor to 1 p.o. daily for the next  month.  She is to minimize intake of caffeine, milk products, and  alcohol for the next few weeks.  She is to contact my office if her  symptoms do not fully resolve, or if they worsen.     Venita Lick. [name], MD, [facility]  Electronically Signed    MTS/MedQ  DD: 12/16/2006  DT: 12/16/2006  Job #: [id number]   cc:   [name]. [name], MD,FACP,FCCP

## 2010-07-02 NOTE — Assessment & Plan Note (Signed)
[facility]                            CARDIOLOGY OFFICE NOTE   Michele [name], Michele [name]                     MRN:          [id number]  DATE:03/31/2007                            DOB:          08/31/1929    This is a patient of Dr. [name].   Michele [name] is a very pleasant 77 year old white female patient who just  had her medications adjusted by Dr. Graciela Husbands back on January 12 for atrial  fibrillation with rapid ventricular rate.  He had, in his notes,  stated  he was discontinuing her verapamil and switching her to Diltiazem and  discontinued her bisoprolol and he gave her 14 days worth of the  atenolol and Inderal to see which beta blocker she would tolerate better  and asked her to come back in followup.  The patient says she did not  have a prescription to switch over to Diltiazem so she continued on the  verapamil. She tried about 9 days worth of a atenolol and she said she  was extremely dizzy and all swollen and then she switched to the Inderal  and took about 5 days and continued to have the swelling and dizziness  so she stopped that. She actually is feeling much better off the beta  blocker. She started exercising at least 4 days a week which she has not  done since August.  She rides her exercise bike and does swimming and  tai chi. When her device was checked today, her events went from 36%  last office visit down to only 3%. So she is having much less  tachycardia.  She keeps checking her pulse at home and says generally at  rest her pulse is 70 and when she exercises it is 100.   CURRENT MEDICATIONS:  1. CoQ 10 200  mg daily.  2. Verapamil 180 mg b.i.d.  3. Crestor 5 mg every week.  4. Florastor 250 mg daily.  5. Vitamin D 1000 international units daily.  6. Centrum Silver.  7. Caltrate.  8. Flax seed oil.  9. She is on Synthroid but I do not have her dose in front of me.   PHYSICAL EXAM:  This is a very pleasant 77 year old white  female in no  acute distress.  Blood pressure is not recorded in the chart. I will  talk to the nurse about her blood pressure as it is not recorded.  Weight is 181, pulse 66.  NECK: Is without JVD, HJR bruit or thyroid enlargement.  LUNGS:  Clear anterior, posterior and lateral.  HEART:  Regular rate and rhythm at 66 beats per minute.  Normal S1-S2;  no significant murmur heard.  ABDOMEN: Is soft organomegaly, masses, lesions or abnormal tenderness.  EXTREMITIES:  Without cyanosis or edema.  She has good distal pulses.   EKG paced rhythm.   IMPRESSION:  1. Atrial fibrillation with failed cardioversion.  2. Pacemaker implantation for bradycardia in the setting of atrial      fibrillation with rapid ventricular rate.  3. Left ventricular dysfunction, question related to rate.  4. Status post total thyroidectomy  in December 2008.  5. Dyslipidemia.  6. Coumadin therapy   PLAN:  At this time, the patient's rates are much better controlled  despite her not taking a beta blocker.  I am going to leave her  medications since she is doing much better and feels better off the beta  blocker.  I asked her to continue exercising and she will see Dr. Graciela Husbands  back within the next month.      Michele Reedy, PA-C  Electronically Signed      Luis Abed, MD, [facility]  Electronically Signed   ML/MedQ  DD: 03/31/2007  DT: 04/01/2007  Job #: [id number]

## 2010-07-02 NOTE — Assessment & Plan Note (Signed)
[facility]                            CARDIOLOGY OFFICE NOTE   ZURIEL, Michele [name]                     MRN:          [id number]  DATE:06/07/2007                            DOB:          Apr 20, 1929    [name] is a pleasant 77 year old patient of Dr. Graciela Husbands who has a  history of paroxysmal atrial fibrillation and has had a prior pacemaker  placed.  She also has LV dysfunction.  Her most recent echocardiogram  was performed on January 07, 2007.  At that time, her ejection fraction  was 35-40%.  She had mild aortic insufficiency and mitral regurgitation.  The left atrium is mildly to moderately dilated.  There is mild  tricuspid regurgitation and the right atrium is mildly to moderately  dilated as well.  She apparently has not tolerated flecainide in the  past.  Apparently, there has been some discussion about Tikosyn and  amiodarone, but she does not wish to pursue any antiarrhythmics due to  the potential side effects.  She is also not tolerated any beta-  blockers, including Coreg, atenolol or bisoprolol.  Her rate is being  controlled with verapamil.  She recently traveled to [place] to see  family.  While she was there, she noticed that her low extremities were  swelling bilaterally.  She also had a rash over her lower extremities.  She went to an urgent care to obtain some Benadryl, and apparently was  referred to the emergency room.  This was at [facility].  She apparently had some lab work drawn, but no other  adjustments were made and she was told to follow up with her physician  here in [place] 24 hours after returning.  She therefore was added  onto our schedule today.  Note, she has not had any significant dyspnea,  chest pain, palpitations or syncope.   MEDICATIONS:  1. Coenzyme Q10.  2. Synthroid 125 mcg p.o. daily.  3. Crestor 5 mg Monday, Wednesday and Friday.  4. Coumadin as directed.  5.  Multivitamin.  6. Caltrate.  7. Flaxseed oil.  8. Verapamil 180 mg p.o. b.i.d.   PHYSICAL EXAMINATION:  VITAL SIGNS:  Today shows a blood pressure of  109/70 and her pulse is 81.  She weighs 194 pounds.  HEENT:  Normal.  NECK:  Supple.  CHEST:  Clear.  CARDIOVASCULAR:  Reveals an irregular rhythm.  ABDOMEN:  Shows no tenderness.  EXTREMITIES:  Show 1+ edema to the mid tibia bilaterally.  There is an  erythematous rash over her lower extremities bilaterally.   We did repeat her electrocardiogram today and it shows atrial  fibrillation at a rate of 81 with occasional ventricular pacing.  There  were no ST changes noted.   DIAGNOSES:  1. Recurrent atrial fibrillation - her rate appears to be mildly      elevated (we did interrogate the pacemaker in which she is in      atrial fibrillation, it appears to be in the 100-140 range).  Note,      she is not having quite as much atrial  fibrillation as she has had      in the past.  I will add digoxin 0.125 mg p.o. daily to see if this      helps with her rate control.  Note, she has not tolerated beta-      blockers in the past, nor is she interested in considering any      antiarrhythmics.  She did state that she has had pedal edema in the      past when she goes into atrial fibrillation, and I think this is      most likely the cause.  She will continue on her Coumadin with a      goal INR of 2-3.  2. Cardiomyopathy - her ejection fraction is 35-40%.  However, she      apparently has not tolerated beta-blockers in the past.  She would      benefit from an angiotensin converting enzyme inhibitor, but I will      leave this to Dr. Graciela Husbands.  It appears that she has not tolerated      many medications in the past.  3. Pedal edema - we have given her a prescription for Lasix today at      20 mg p.o. daily for 3 days and then every other day.  She will      have a BMET in 5 days to follow her potassium and renal function.  4. Coumadin therapy  - this is being monitored in the Coumadin Clinic.  5. Status post pacemaker placement.   I will have her follow up with Dr. Graciela Husbands prior to her returning to  [place].     Madolyn Frieze Jens Som, MD, [facility]  Electronically Signed    BSC/MedQ  DD: 06/07/2007  DT: 06/07/2007  Job #: [id number]

## 2010-07-02 NOTE — Assessment & Plan Note (Signed)
[facility]                         ELECTROPHYSIOLOGY OFFICE NOTE   OLIVA, MONTECALVO                     MRN:          [id number]  DATE:06/09/2007                            DOB:          June 02, 1929    Michele [name] was seen by Dr. Jens Som as an add-on the other day because  of problems on a recent trip to [place], specifically severe peripheral  edema that resulted in a diffuse nonerythematous discoloring rash.  She  was found at that point to have a normal hemoglobin, a bunch of other  rheumatological workup was sent but is not back, her CBC was normal.  There was some conjecture based on that visit when he found her to be in  atrial fibrillation, that the edema might be accompanying that.  However, interrogation of her device demonstrated that the atrial  fibrillation has been really infrequent for most of the last 4-5 months.  This edema is a dependent phenomenon and interestingly, occurs even  while walking on the treadmill so she has to loosen her shoes when she  gets off.  She has been intolerant of multiple medications and so his  plan was to give her a little bit of a diuretic which was associated  with some diuresis but not significant.   OTHER MEDICATIONS:  1. Coumadin.  2. Verapamil 180 b.i.d.  3. Synthroid.  4. Crestor.  5. Co-Q 10.   PHYSICAL EXAMINATION:  VITAL SIGNS:  Blood pressure today was 107/68  with a pulse of 89.  The weight was 182.  NECK:  Neck veins were flat.  LUNGS:  The lungs were clear.  CARDIAC:  The heart sounds were regular.  EXTREMITIES:  Extremities had trace edema.  SKIN:  There is residual petechial rash.   Interrogation of her pacemaker was accomplished 2 days ago and  demonstrated atrial fibrillation as noted.   IMPRESSION:  1. Atrial fibrillation - paroxysmal.  2. Coumadin for the above.  3. Reluctance to undertake antiarrhythmic drug therapy.  4. Peripheral edema that is dependent and not  relieved with exercise,      suggesting to me possibly venous insufficiency.   Michele [name] is getting ready to go to [place] and we have given her  prescription then for 30-40 mm stockings which she can either get in  thigh-high or pantyhose form.  Hopefully this will help.  She is to  continue her Coumadin obviously.   I suggest that she discontinue the digoxin that Dr. Jens Som started her  on last time as her atrial fibrillation is largely paroxysmal.  She is  to take verapamil extra when she notes her atrial fibrillation.  We will  see her when she returns from [place] at the end of the summer.     Duke Salvia, MD, [facility]  Electronically Signed    SCK/MedQ  DD: 06/09/2007  DT: 06/09/2007  Job #: [id number]

## 2010-07-02 NOTE — Op Note (Signed)
NAMESHAVANNA, FURNARI NO.:  [id number]   MEDICAL RECORD NO.:  [id number]          PATIENT TYPE:  INP   LOCATION:  2917                         FACILITY:  [facility]   PHYSICIAN:  Duke Salvia, MD, FACCDATE OF BIRTH:  [birth year]   DATE OF PROCEDURE:  03/09/2008  DATE OF DISCHARGE:                               OPERATIVE REPORT   PROCEDURES:  Contrast venogram; explantation of a previously implanted  device, insertion of a dual-chamber cardiac resynchronization  defibrillator with anticipation of atrioventricular junction ablation.   PREOPERATIVE DIAGNOSES:  1. Uncontrolled atrial fibrillation.  2. Cardiomyopathy.  3. Congestive heart failure.   POSTOPERATIVE DIAGNOSES:  1. Uncontrolled atrial fibrillation.  2. Cardiomyopathy.  3. Congestive heart failure.   DESCRIPTION OF PROCEDURE:  Following obtaining informed consent, the  patient was brought to the electrophysiology laboratory and placed on  the fluoroscopic table in supine position.  After routine prep and  drape, a contrast was injected via the left antecubital vein, which  demonstrated the course of the patency of the extrathoracic left  subclavian vein.  After routine prep and draped, the lidocaine was  infiltrated along the line of the previous incision and was carried down  to layer of the device pocket, but the device pocket was not opened.  Venous cannulation was accomplished through two separate venipunctures,  then 8-French and 9.5-French sheaths were placed which passed a Durata  7128 dual coil active fixation defibrillator lead, serial number  [id number], which was manipulated under fluoroscopic guidance to the right  ventricular apex where the bipolar R-wave was 12.9 with a pace impedance  of 563, a threshold of 0.5 volts at 0.5 milliseconds.  Current threshold  was 0.8 mA.  There was no diaphragmatic pacing at 10 volts and the  current of injury was brisk.  I should note that the  aforementioned  parameters were from the second location, which after we had placed the  LV lead, we realized that the RV tip was on the same plane as the septal  lead, was not in the apex and because of potential pacing issues, more  apical location was desired and hence accomplished.  This lead was  secured to the prepectoral fascia and over the other wire was placed, a  9.5-French sheath was just placed a Medtronic MB2 coronary sinus  cannulation catheter, which allowed for rapid cannulation of the  coronary sinus.  There was a valve in the body which required some  manipulation to get around including using whisper wires, but a Wholey  wires and then deployment of the sheath.  There was a high lateral  branch which was relatively short, but the only branch that we saw.  We  then targeted with a Medtronic 4196 88 cm passive fixation.  LV lead  serial number [id number] and we finally found a location where the lead  appeared to be very stable.  The initial locations were unstable because  of being in the confirmation of the native canters, the other one  actually put a retroflexion.  Unfortunately, the distal tip was  associated with diaphragmatic stimulation and  so we used the proximal  pole and RV coil to measure an amplitude of 9.2 with a pace impedance of  491, a threshold of 0.1 volts at 0.5 msec.  Current threshold was 2.7  mA.  There was no diaphragmatic pacing at 10 volts.  The sheath was  removed.  This lead was secured to the prepectoral fascia.  At this  point, only the device pocket was opened and the device was explanted.  The pocket was expanded to how is the implantable defibrillator.  Hemostasis was obtained.  The leads were then attached to a Medtronic  Concerto II, model number [id number], serial number [id number].  Through  the device, bipolar R-wave was 12.1 with a pace impedance of 532, a  threshold of 0.5 at 0.4.  The P-wave was 3.3 with impedance of 475 and  atrial  fibrillation.  The LV impedance was 332 with threshold of 2.25  volts at 0.4 msec.  Proximal coil impedance was 51, distal coil  impedance was 59.   The pocket was copiously irrigated with antibiotic containing saline  solution.  Hemostasis was assured and this took some time.  The leads  and the pulse generator were placed and the pocket secured to the  prepectoral fascia and Surgicel was used on the lateral aspect and the  superior aspect of the pocket.  We sent the most time obtaining  hemostasis.  The leads and the pulse generator were secured to the  prepectoral fascia.  The wound was closed in three layers in normal  fashion.  The wound was washed, dried, and a benzoin and Steri-Strip  dressing was applied.  Needle counts, sponge counts, and instrument  counts were correct at the end of the procedure according to staff.   Defibrillation threshold testing was then undertaken.  Ventricular  fibrillation was induced with a moderate difficulty with a coupling  interval finally of 270 msec.  After a total duration of 5.5 seconds, a  15-joule shock was delivered through a measured resistance of 47 ohms  terminating ventricular fibrillation and infected atrial fibrillation  restoring sinus rhythm.   The patient was prepared for AV junction ablation.      Duke Salvia, MD, [facility]  Electronically Signed     SCK/MEDQ  D:  03/09/2008  T:  03/10/2008  Job:  [id number]

## 2010-07-02 NOTE — Op Note (Signed)
NAMEBRITTNAE, ASCHENBRENNER NO.:  [id number]   MEDICAL RECORD NO.:  [id number]          PATIENT TYPE:  INP   LOCATION:  2917                         FACILITY:  [facility]   PHYSICIAN:  Duke Salvia, MD, FACCDATE OF BIRTH:  08-10-1929   DATE OF PROCEDURE:  03/09/2008  DATE OF DISCHARGE:                               OPERATIVE REPORT   PREOPERATIVE DIAGNOSIS:  Uncontrolled atrial fibrillation status post  CRT-D implantation.   POSTOPERATIVE DIAGNOSIS:  Uncontrolled atrial fibrillation status post  CRT-D implantation.   PROCEDURE:  His bundle recording and atrioventricular junction ablation.   Following upgrade of the device as described previously, the patient was  then prepared for AV junction ablation.  Catheterization was performed  with local anesthesia via right femoral vein.   Using an SL2 sheath and a 4-mm tip catheter, the His recording was made  which was 39 milliseconds.  Multiple applications were applied with  initial creation of right bundle-branch block and subsequent result of  complete heart block with a junctional escape rate of about 37  milliseconds.   After the patient was observed for 50 minutes, the sheath was removed.  The device was programmed at 90 beats per minute.  The patient was  transferred to the holding area for sheath removal in stable condition.      Duke Salvia, MD, [facility]  Electronically Signed     SCK/MEDQ  D:  03/09/2008  T:  03/10/2008  Job:  [id number]

## 2010-07-02 NOTE — Letter (Signed)
November 19, 2006    Sean A. Everardo All, MD  [address]  [place], [place] [address]   RE:  TANASHIA, CIESLA  MRN:  [id number]  /  DOB:  1929-04-21   Dear Gregary Signs:   Accompanying this letter are records we have received recently from Dr.  [name] which date back to 1990.  As of  that Margit Hanks was  experiencing aching in her throat and neck .She  had been seen by Dr.  Haroldine Laws and  was referred to Dr. Juleen China at that time for a second  opinion.  There are multiple ultrasounds dating from 11 to November of  1996.  There is no fixed pattern to the findings, but rather a  suggestion of multinodular goiter.  She has been found to have punctate  calcifications scattered throughout the thyroid gland.   Her ultrasound of the thyroid done May 30 reveals 3 solid nodules, at  least 1 containing calcium deposits .   I appreciate your evaluation to date; would you reassess Chauntelle in the  setting of these new data?   I had recommended she consider thyroidectomy, as we now have a process  that has been monitored for almost 20 years, and it is obviously a  source of great emotional stress to her & will continue to be so.   I appreciate your evaluation and recommendations.    Sincerely,      [name]. [name], MD,FACP,FCCP  Electronically Signed    WFH/MedQ  DD: 11/19/2006  DT: 11/19/2006  Job #: [id number]   CC:    Hermelinda Medicus, M.D.

## 2010-07-02 NOTE — Assessment & Plan Note (Signed)
[facility]                         ELECTROPHYSIOLOGY OFFICE NOTE   Michele [name], Michele [name]                     MRN:          [id number]  DATE:10/21/2006                            DOB:          07/18/1929    HISTORY OF PRESENT ILLNESS:  Michele [name] comes in having failed the  cardioversion for atrial fibrillation.  We were discussing treatment  options, and she insists at the discussion that she does not want to  take any medications.  We had discussed the potential use of anti-  arrythmic drug therapy.   Her issue is recurrent atrial fibrillation with a rapid ventricular  response associated with progressive deterioration of her LV systolic  function, most recently recorded in the mid 40s.   She has a nonischemic Myoview from a couple of years ago.   CURRENT MEDICATIONS:  Coumadin, Synthroid and Vytorin as well as  nutraceuticals.   PHYSICAL EXAMINATION:  VITAL SIGNS:  Blood pressure today was 116/74,  pulse 94.  LUNGS:  Clear.  HEART:  Sounds were irregular.  EXTREMITIES:  Without edema.   IMPRESSION:  1. Atrial fibrillation.  2. Propensity towards bradycardia.  3. Nonischemic cardiomyopathy, presumably related to rapid ventricular      rates now at 35-40%.   IMPRESSION:  Michele [name] has progressive left ventricular dysfunction in  a state of rapid atrial fibrillation.  She is averse to drug therapy.   We will plan to admit her to hospital and implant a pacemaker.  It is my  hope that this myopathy is reversible.  We will not undertake ICD  implantation.  A dual-chamber device will be utilized.   Following initiation of therapy, I will begin her on Coreg; we had  discussed verapamil, but I think with her LV dysfunction, Coreg is the  better choice.  She and her husband and I discussed the potential  benefits as well as the potential risks of a Medtronic Adapta Pulse  pacemaker implantation.  These include but are not limited to death,  perforation, infection and lead dislodgement.   We will plan to proceed.     Duke Salvia, MD, [facility]  Electronically Signed   SCK/MedQ  DD: 10/21/2006  DT: 10/21/2006  Job #: [id number]

## 2010-07-02 NOTE — Consult Note (Signed)
[facility]                          ENDOCRINOLOGY CONSULTATION   Michele [name], Michele [name]                     MRN:          [id number]  DATE:09/30/2006                            DOB:          [birth year]    REFERRING PHYSICIAN:  [name]. [name], MD,FACP,FCCP   REASON FOR REFERRAL:  Goiter.   HISTORY OF PRESENT ILLNESS:  This is a 77 year old woman who states that  in the 1970s she was found to have a goiter.  She had a biopsy at [facility] in the 1990s which she states was benign.  She states that she can  notice the goiter, but does not feel there has been any recent change in  the size.  She was also diagnosed with hypothyroidism in 1996 for which  she takes Synthroid 75 mcg four times a week and half of a 75 mcg tablet  on other days.  She states she feels well except for some heat  intolerance.  She also has a four year history of atrial fibrillation,  and she is scheduled for cardioversion soon.   She has one year of hoarseness, and she wonders could this be thyroid  related.   PAST MEDICAL HISTORY:  1. Colonic polyps.  2. Dyslipidemia.   SOCIAL HISTORY:  She is retired.  She is here with her husband.   FAMILY HISTORY:  She states her brother has a goiter.   REVIEW OF SYSTEMS:  Denies shortness of breath and she denies dysphagia.  She has a very mild memory loss.   PHYSICAL EXAMINATION:  VITAL SIGNS:  Blood pressure 115/74, heart rate  81, temperature 97.1, weight 174.  GENERAL:  No distress.  SKIN:  No rash.  Not diaphoretic.  HEENT:  No proptosis.  NECK:  There is a 5 times normal sized multinodular goiter palpable.  CHEST:  Clear to auscultation.  No respiratory distress.  CARDIOVASCULAR:  No edema.  Irregular rhythm, no murmurs.  Pedal pulses  are intact.  NEUROLOGICAL:  Alert and oriented, does not appear anxious nor  depressed.  No tremor.   LABORATORY STUDIES:  Thyroid ultrasound from Jul 17, 2006, is reviewed.  TSH is  normal on Jul 01, 2006, as well as two other recent times.   IMPRESSION:  1. Multinodular goiter which has probably been present for many years.  2. Well replaced hypothyroidism.  3. Atrial fibrillation.  4. Hoarseness, this is unlikely to be thyroid related.   PLAN:  1. I told her that the first thing she should do is resolve the matter      of her atrial fibrillation.  It would be great if this could be      cardioverted and she would stay in regular sinus rhythm.  If this      is the case, I have advised her to call me and we will get her set      up for an ultrasound guided biopsy of the goiter.  2. If it is decided that she will stay on Coumadin because of chronic      atrial fibrillation, I have advised her  to have a repeat ultrasound      in three months to determine stability of the thyroid.  3. Refer to ENT to evaluate the hoarseness.  4. Return in one year or sooner if necessary .     Sean A. Everardo All, MD  Electronically Signed    SAE/MedQ  DD: 10/04/2006  DT: 10/05/2006  Job #: [id number]   cc:   [name]. [name], MD,FACP,FCCP  Duke Salvia, MD, [facility]

## 2010-07-02 NOTE — Assessment & Plan Note (Signed)
[facility]                         ELECTROPHYSIOLOGY OFFICE NOTE   JOELLEN, TULLOS                     MRN:          [id number]  DATE:02/22/2008                            DOB:          Jan 05, 1930    Ms. Orlowski comes in with her husband today with a list of 10 or 12  questions related to the lengthy discussions we have had over the last 6-  12 months regarding progressive LV function deterioration in the setting  of atrial fibrillation with a poorly-controlled ventricular response,  intolerance to beta-blockers, and higher doses of calcium channel  blockers.  When we last met this pair, we in November discussed  treatment options including AV junction ablation and antiarrhythmic drug  therapy that has not been previously successful, as well as atrial  fibrillation ablation.   We again discussed today the same options with regard to intravenous  drug therapy, failure of them to hold in the past.  This relates to  atrial fibrillation, the potential morbidities of the procedure, the  predicted efficacy of about 50% with procedure, the potential side  effects all in the context of her age as well as AV junction ablation,  and upgrade of her Medtronic dual-chamber pacemaker to a CRTD device.  When we saw her last, we also put her back on verapamil in addition to  her Lanoxin.  With that, her heart rate control has been better with  only 40% of her heartbeat faster than 100 as opposed to 75 when we last  saw her.   Her medications include verapamil 180, digoxin of 0.125, Crestor 10,  Synthroid 125 mcg with normal TSH and Coumadin.   She has been intolerant of BETA-BLOCKERS as noted previously.   On examination today, her blood pressure was 118/62 with a pulse of 68,  the weight was 180 pounds, which is stable.  Her neck veins were 6-7 cm.  Her lungs were clear.  Her heart sounds were irregular and rapid with a  2/6 murmur heard best at the apex.   The abdomen was soft with active  bowel sounds.  Distal pulses were intact.  There is no significant  edema.   IMPRESSION:  1. Cardiomyopathy, likely related with a Myoview scan that was      negative about 4 years ago.  2. Atrial fibrillation with a poorly-controlled ventricular response.  3. Thromboembolic risk factors notable for,      a.     Congestive heart failure.      b.     Hypertension.      c.     Age.      d.     Gender.   After this lengthy discussion, Ms. Yarbro has decided to proceed with AV  junction ablation and CRTD upgrade.  We have discussed the potential  benefits as well as the potential risks including, but not limited to  infection, perforation with lead insertion, and inappropriate therapies,  although this should be limited largely by the AV junction ablation.   We will plan to undertake Myoview scanning prior to the procedure just  to make sure that there is no ischemic contribution to her LV function  deterioration, although I think the likelihood of this is low.     Duke Salvia, MD, [facility]  Electronically Signed    SCK/MedQ  DD: 02/22/2008  DT: 02/23/2008  Job #: [id number]

## 2010-07-02 NOTE — Assessment & Plan Note (Signed)
[facility]                         ELECTROPHYSIOLOGY OFFICE NOTE   Michele, [name]                     MRN:          [id number]  DATE:01/07/2007                            DOB:          03-Jun-1929    [name] comes in for followup of her wound that healed slowly and had  a small cavity on the medial aspect, which I wicked, and her atrial  fibrillation, which has been rapid.  She is also scheduled to undergo  thyroid surgery on December 10.   She has no significant symptoms.  She did undergo echocardiography today  to see if we had improvement or worsening of her LV systolic function.  She unfortunately did not start taking her bisoprolol until yesterday.   OTHER MEDICATIONS INCLUDE:  Verapamil, Coumadin, and Synthroid at 75  micrograms.   ON EXAMINATION:  Her blood pressure today was 115/70, her pulse was 68.  LUNGS:  Clear.  Her heart sounds were irregular.  There was a 2/6 systolic murmur.  ABDOMEN:  Soft.  EXTREMITIES:  Without edema.   Interrogation of her Medtronic pulse generator demonstrates that 35% of  her beats are still faster than 110 beats per minute.   IMPRESSION:  1. Atrial fibrillation with a rapid ventricular response.  2. Propensity towards bradycardia.  3. Impending thyroid surgery.  4. Non-ischemic cardiomyopathy, probably related to rate.   We will wait to see what the echo shows.  She is to increase her  bisoprolol.  I will plan to have her double her verapamil to 240 twice a  day.   I will plan to see her when she comes in for her surgery on December 10.  In the event that we have inadequate rate control, we will augment her  beta blockade and, at some point, we may need to consider further  interventions to control her rate, especially if the LV systolic  function is worsening.   Otherwise, she will be scheduled to be seen in eight weeks' time.     Duke Salvia, MD, [facility]  Electronically  Signed    SCK/MedQ  DD: 01/07/2007  DT: 01/08/2007  Job #: [id number]

## 2010-07-02 NOTE — Assessment & Plan Note (Signed)
[facility]                         ELECTROPHYSIOLOGY OFFICE NOTE   Michele, [name]                     MRN:          [id number]  DATE:07/16/2006                            DOB:          1929/06/26    Michele [name] comes in today feeling quite well.  She had a protracted  episode of shortness of breath a couple of days ago that lasted about an  hour or two.  It was unassociated with palpitations.  It happened when  she awoke at night with a start.  She was lying flat, which is not her  norm.   Her medications are notable for Synthroid and Vytorin.  She did not  start the pindolol.  She went down to [place] to help with her daughter-  in-law, who has breast cancer.  She did not want the side effects.   PHYSICAL EXAMINATION:  VITAL SIGNS:  On examination today, her blood  pressure was 126/78.  Her pulse was 112 and irregular.  LUNGS:  Clear.  HEART:  Heart sounds were irregular.  EXTREMITIES:  Without edema.   Electrocardiogram dated today demonstrated atrial fibrillation at 100  beats per minute or so.  The intervals were -/0.08/0.36 with a QTc of  0.46.  Axis was 48 degrees.   IMPRESSION:  1. Recent episode of chest pain and shortness of breath, somewhat      concerning.  2. Atrial fibrillation with a rapid ventricular response.  3. Previously known left ventricular dysfunction, mild at 40-45%,      obtained about a year and a half ago, where the ejection fraction      two years prior to that was normal.   Michele [name] has atrial fibrillation with a rapid ventricular response.  Most recent echo had demonstrated an interval change in her ejection  fraction from normal to about 40-45%.  I am concerned that with her  rapid ventricular response, there has been further deterioration. As the  patient has no symptoms, the only real guide that we have for  intervention will be detouring left ventricular systolic function, as I  cannot otherwise  convince her to take medications for this.   She did make the observation that previously when she took verapamil,  she was also taking grapefruit, and that may have explained the potency  and the hypotension which became intolerable.   PLAN:  1. To serve the above end we will plan to check a troponin and a BMP      today.  2. Arrange for a 2D echo.  3. Will need to review the above and decide how next to proceed.  4. Medication followup will depend also on what I can convince her of      following the above.     Duke Salvia, MD, [facility]  Electronically Signed    SCK/MedQ  DD: 07/16/2006  DT: 07/16/2006  Job #: [id number]   cc:   [name]. [name], MD,FACP,FCCP

## 2010-07-02 NOTE — Discharge Summary (Signed)
NAMESALIMAH, MARTINOVICH NO.:  [id number]   MEDICAL RECORD NO.:  [id number]          PATIENT TYPE:  INP   LOCATION:  2917                         FACILITY:  [facility]   PHYSICIAN:  Duke Salvia, MD, FACCDATE OF BIRTH:  June 02, 1929   DATE OF ADMISSION:  03/09/2008  DATE OF DISCHARGE:  03/10/2008                               DISCHARGE SUMMARY   PROCEDURES:  1. Contrast venogram.  2. Explantation of a previously implanted device.  3. Insertion of a dual-chamber cardiac resynchronization      defibrillator.  4. His bundle recording and atrioventricular junction ablation.   PRIMARY FINAL DISCHARGE DIAGNOSES:  1. Atrial fibrillation with poorly controlled ventricular response.  2. Intolerance to beta-blockers and higher doses of calcium channel      blockers.  3. Chronic anticoagulation with Coumadin, INR at discharge 3.1.  4. Cardiomyopathy, likely nonischemic with a Myoview scan that was      negative about 4 years ago, ejection fraction 30-35% by      echocardiogram in November 2009.  5. Chronic systolic congestive heart failure, euvolemic.  6. Hypertension.  7. History of diverticulosis and diverticulitis.  8. Hyperlipidemia.  9. History of thyroid nodules and hypothyroidism.  10.Anxiety syndrome.  11.History of Clostridium difficile colitis.   TIME AT DISCHARGE:  34 minutes.   HOSPITAL COURSE:  Ms. Michele [name] is a 77 year old female with a history of  atrial fibrillation and anticoagulation with Coumadin.  She was seen by  Dr. Graciela Husbands on February 22, 2008, and the decision was made to upgrade her  device to a CRT-D and ablate her AV node.  She was admitted to the  hospital for this on March 09, 2008.   She had successful explantation of a previous device and then  implantation of a Medtronic Concerto II, model number [id number].  Her AV  node was then ablated.  She tolerated the procedure well.   On March 10, 2008, her device was interrogated and showed normal  device function.  Her chest x-ray showed cardiomegaly without pulmonary  edema or pneumothorax.  She was evaluated by Dr. Graciela Husbands who considered  her stable for discharge with med changes as noted and followup  arranged.   DISCHARGE INSTRUCTIONS:  1. Her activity level is to be increased gradually per the discharge      instruction sheet.  2. She is to call our office for problems with the incision.  3. She is to stick to a low-sodium heart-healthy diet.  4. She is to get a wound check on March 22, 2008, at 9:20.  5. She is to see Dr. Graciela Husbands for AFib followup on April 19, 2008, at 10      a.m.  6. She is to follow up with Dr. [name] as needed.   DISCHARGE MEDICATIONS:  1. Coumadin 5 mg daily except for 7.5 mg 2 days a week, hold Friday      and Saturday and resume Sunday, check next week.  2. Synthroid 125 mcg daily.  3. Crestor 10 mg a day.  4. Digoxin is stopped.  5. Verapamil is stopped.  6.  Vitamin D3 daily.  7. Centrum Silver daily.  8. Caltrate b.i.d.  9. Flaxseed oil daily.  10.CoQ 10 daily.  11.Florastor 250 mg b.i.d.      Theodore Demark, PA-C      Duke Salvia, MD, [facility]  Electronically Signed    RB/MEDQ  D:  03/10/2008  T:  03/10/2008  Job:  [id number]   cc:   Dr. [name]

## 2010-07-02 NOTE — Assessment & Plan Note (Signed)
[facility]                         ELECTROPHYSIOLOGY OFFICE NOTE   [name], [name]                     MRN:          [id number]  DATE:05/21/2007                            DOB:          [birth year]    Michele [name] is seen in follow-up for atrial fibrillation with a rapid  ventricular response.  Serendipitously but somewhat pleasantly, she has  been sinus rhythm for most of the last three months.  She did have an  episode a couple of weeks ago where she went into atrial fibrillation  that was associated with a moderately rapid ventricular response.  In  this occurrence about 30-40% of her heart beats appeared to be faster  than 100 beats per minute.   She is in the midst of having her thyroid medications adjusted.   EXAMINATION:  Her blood pressure was 123/65 with a pulse of 70.  Her lungs were clear.  Heart sounds were regular.  The extremities were without edema.   Interrogation of her pacemaker demonstrated atrial fibrillation episodes  as described previously.  Her other parameters were okay.   IMPRESSION:  1. Bradycardia, status post pacemaker implantation with 60%      ventricular pacing.  2. Atrial fibrillation with a rapid ventricular response despite      verapamil 180 mg b.i.d.  3. Intolerance previously-prescribed beta-blockers.   Michele [name] is doing okay.  I am concerned about her reversions to atrial  fibrillation.  I have suggested she take an extra verapamil at that  time.   She will be going to [place] in a couple of months' time for 6 weeks.  I  hope that she will have a good and safe and atrial fibrillation-free  trip.   We will see her again in six months.     Duke Salvia, MD, [facility]  Electronically Signed    SCK/MedQ  DD: 05/21/2007  DT: 05/21/2007  Job #: [id number]

## 2010-07-02 NOTE — Assessment & Plan Note (Signed)
[facility]                         ELECTROPHYSIOLOGY OFFICE NOTE   TAJI, SATHER                     MRN:          [id number]  DATE:12/09/2006                            DOB:          1930-02-12    Michele [name] comes in for followup for atrial fibrillation with a  rapid ventricular response and a cardiomyopathy that his hopefully  secondary to tachycardia.  She was last assessed by echocardiogram in  May with an ejection fraction of 35-40%.  We have tried in the interim  to control her heart rate using Coreg and Verapamil.  We have not been  successful in this.  The Coreg is bothering her with a metallic taste  and so she is uptitrated.  She is tolerating the Verapamil which is a  concern obviously with her LV dysfunction.   Her other medications include Synthroid, Flagyl and recently started on  vancomycin for C. difficile colitis.  The latter of which has been  associated with marked improvement.   PHYSICAL EXAMINATION:  VITAL SIGNS:  Blood pressure is 109/68, pulse 82,  weight 170.  LUNGS:  Clear.  HEART:  Heart sounds were irregular with early systolic murmur.  EXTREMITIES:  Without edema.   Interrogation of her pacemaker demonstrates that at least one-third of  her heart beats were greater than 100.   IMPRESSION:  1. Atrial fibrillation with a rapid ventricular response.  2. Nonischemic cardiomyopathy, hopefully related to #1.  3. Intolerance of Toprol and Coreg with inadequate rate control.  4. Impending thyroidectomy.  5. Clostridium difficile colitis, question contributing to      tachycardia.   PLAN:  We will plan to reassess [name] in about 5 weeks' time prior  to her thyroid surgery.  We will obtain an echocardiogram at that time.  I have also put her on bisoprolol 2.5 mg twice daily as an alternative  to the Coreg and we will see how this, in conjunction with Verapamil,  does to control her heart rate.     Duke Salvia, MD, [facility]  Electronically Signed    SCK/MedQ  DD: 12/09/2006  DT: 12/10/2006  Job #: [id number]   cc:   Velora Heckler, MD

## 2010-07-02 NOTE — Assessment & Plan Note (Signed)
[facility]                         ELECTROPHYSIOLOGY OFFICE NOTE   MIZUKI, HOEL                     MRN:          [id number]  DATE:12/31/2007                            DOB:          [birth year]    Ms. Stucker and her husband and I had a lengthy greater than 30-minute  discussion regarding a progressive deterioration of her LV systolic  function in the setting of poorly controlled atrial fibrillation.  She  has been intolerant to beta-blockers in the past.  She had been on  calcium-blockers, most specifically verapamil.  We tried to increase it  to twice a day.  This is associated with decent but not great heart rate  control.  It was associated also with bloating and edema, which we  noticed by discontinuing it and we tried Lanoxin and this was added  about 3 weeks ago.  Over the last 3 weeks, 25% of her heartbeats have  been less than 100, 75% of her heartbeats have been greater than 100.  Also, echocardiography has demonstrated progressive deterioration of LV  systolic function over the last 3-4 years, now at the 30% to 35% range.  The patient has mild exercise intolerance.   We discussed the potential issues related to tachycardia-induced  cardiomyopathy including,  1. Symptomatic congestive heart failure.  2. Sudden cardiac death.   We talked about the importance of rate control and the options including  antiarrhythmic drug therapy, specifically amiodarone or Tikosyn and/or  AV junction ablation.  We did not discuss atrial fibrillation ablation  as I did not feel that given her permanent atrial fibrillation that the  likelihood of success was sufficient to interrupt the concerns regarding  LV dysfunction, tachycardia mediated.   I should note that on examination today, her blood pressure was 118/80,  her pulse was 95 and irregular.  Her weight was 179, which was stable.  Her lungs were clear.  Her neck veins were 8-9 cm.  Her heart  sounds  were irregular and rapid.  The abdomen was soft and the extremities had  just trace edema.   Interrogation of her pacemaker was as noted above.   Ms. Mitten has asked if she could wait until after Christmas to  probably proceed with AV junction ablation and consideration of  upgrade to an ICD+/- CRT.  I have reviewed with them the risks of  waiting as well as of the procedures.  We will plan to see them in the  first week in January at which time hopefully we will make a decision  one way or the other.  In the interim, I have asked her, withstanding  this negative inotropic effects, to add back verapamil 180 mg a day as  she has been able to tolerate this dose in the past and hopefully would  help improve heart rate control.     Duke Salvia, MD, [facility]  Electronically Signed   SCK/MedQ  DD: 12/31/2007  DT: 01/01/2008  Job #: [id number]

## 2010-07-02 NOTE — Assessment & Plan Note (Signed)
[facility]                         ELECTROPHYSIOLOGY OFFICE NOTE   CHARLESTINE, ROOKSTOOL                     MRN:          [id number]  DATE:11/11/2006                            DOB:          06-Nov-1929    Ms. Babson comes in following pacemaker implantation for tachybrady  syndrome.  She is back in atrial fibrillation.  She was not taking  Toprol because she thought it would make her gain weight.   There is a little bit of erosion on the medial aspect of her wound,  which I explored.  I did not see a suture; in fact, I found a small  cavity underneath there.   We agreed to put her back on verapamil as a rate-controlling agent with  the hope that her cardiomyopathy is related to tachycardia.  This will  be at 240 mg a day.  I will increase her Coreg to 6.25 mg twice a day.   We will see her again in 4 weeks' time.   I also put a small wick into the incision to allow it to help heal from  the inside out.   We will see how this does, and I have asked her to do it in the interim.     Duke Salvia, MD, [facility]  Electronically Signed    SCK/MedQ  DD: 11/11/2006  DT: 11/12/2006  Job #: [id number]

## 2010-07-02 NOTE — Op Note (Signed)
NAMEEMERIE, VANDERKOLK NO.:  [id number]   MEDICAL RECORD NO.:  [id number]          PATIENT TYPE:  OIB   LOCATION:  2807                         FACILITY:  [facility]   PHYSICIAN:  Duke Salvia, MD, FACCDATE OF BIRTH:  April 25, 1929   DATE OF PROCEDURE:  10/26/2006  DATE OF DISCHARGE:                               OPERATIVE REPORT   PREOPERATIVE DIAGNOSIS:  Tachybrady syndrome.   POSTOPERATIVE DIAGNOSIS:  Tachybrady syndrome.   PROCEDURE:  Dual-chamber pacemaker implantation.   Following obtaining informed consent, the patient was brought to the  electrophysiology laboratory and placed on the fluoroscopic table in  supine position.  After routine prep and drape of the left upper chest,  lidocaine was infiltrated in prepectoral subclavicular region.  An  incision was made and carried down to layer of the prepectoral fascia  using electrocautery.  A pocket was formed similarly.  Hemostasis was  obtained.   Thereafter attention was turned to gaining access to extrathoracic left  subclavian vein which was accomplished without difficulty without the  aspiration of air or puncture of the artery.  Two separate venipunctures  were accomplished.  Guidewires were placed and retained and 0 silk  suture was placed in a figure-of-eight fashion.  Subsequently 7-French  sheaths were placed through which were passed a Medtronic 5076 52-cm  active fixation ventricular lead serial number [id number] and a  Medtronic 5076 45-cm fixation atrial lead serial number [id number].  Ventricular lead was marked with a tie.  Ventricular lead was made to  the right ventricular septum where the bipolar R wave was 8.8 with a  pace impedance of 991 ohms, a threshold 1.2 volts at 0.5 milliseconds.  Current threshold 1.3 MA.  There is no diaphragmatic pacing at 10 volts  with a current of injury was  brisk.  The atrial lead was manipulated to  the right atrial appendage where bipolar fibrillation wave  ranged from  0.9 to 4.6 with an average about 2.1, the impedance was 685.  Current of  injury was adequate, the leads were secured prepectoral fascia.  The  leads were then attached to a Medtronic Adapta  ADDRL1 pulse generator,  serial number [id number].  The pocket was copiously irrigated with  antibiotic containing saline solution.  Hemostasis was assured.  The  leads and pulse generator were placed in the pocket, secured to the  prepectoral fascia.  Surgicel was used on the anterior and posterior  aspect of the wound because of a little bit of oozing from the fat and  the wound was then closed in three layers in normal fashion.  It was  washed and dried and a benzoin Steri-Strip dressing was applied.  Needle  counts, sponge counts and instrument counts were correct at end of  procedure according to staff.  The patient tolerated the procedure  without apparent complication.      Duke Salvia, MD, [facility]  Electronically Signed     SCK/MEDQ  D:  10/26/2006  T:  10/26/2006  Job:  [id number]   cc:   Electrophys lab

## 2010-07-02 NOTE — Letter (Signed)
December 03, 2006    [name]. [name], MD, FACG  [address]  [place], [place] [address]   RE:  [name], [name]  MRN:  [id number]  /  DOB:  [birth year]   Dear [name]:   I have asked [name] to see you to follow up her recent acute  diverticulitis complicated by diarrhea with positive C. difficile toxin  studies. Stool cultures were negative.   Clinically, when I saw her on October 10th, there was no evidence of  acute diverticulitis and repeat CT on 9/26 had shown improvement in the  bowel inflammation and no evidence of an abscess.   She was having diarrhea on the 10th; she stated that it had been less  severe while she was on Flagyl and Cipro, which she had taken for  approximately 12 days.   She has been notified that we would switch to vancomycin if the diarrhea  does not respond to reinitiation of the Flagyl.   Additionally, she has had a multinodular goiter which has been monitored  by [name] and [name] remotely. Evaluation included  to  include needle aspiration with benign cytology. Most recently, she had  been seen by [name] and [name]. This has been an issue that  has been present for approximately two decades. The most recent thyroid  ultrasound showed multiple lesions of non-specific etiology and biopsy  had been suggested.   Due to the clonicity of this problem and the lack of definitive  diagnosis without thyroid resection, I have asked her to consider this.  The option would be to repeat ultrasounds annually and potentially  multiple repeat  needle aspirations over time. I would be concerned  about the increased risk of malignant process with aging.  She has had a tachy-brady syndrome with paroxysmal atrial fibrillation.  [name] had inserted a pacemaker.   I feel defining the thyroid process and treating it definitively is in  her best interest, as this is obviously a major source of anxiety for  her.   I would appreciate the  input of the specialists involved in Michele's  care to guarantee a continuity and optimal care.    Sincerely,      [name]. [name], MD,FACP,FCCP  Electronically Signed    [facility]/MedQ  DD: 12/06/2006  DT: 12/07/2006  Job #: [id number]   CC:   Duke Salvia, MD, [facility]  Sean A. Everardo All, MD  Velora Heckler, MD

## 2010-07-02 NOTE — Assessment & Plan Note (Signed)
[facility]                               LIPID CLINIC NOTE   Michele [name], Michele [name]                     MRN:          [id number]  DATE:12/28/2006                            DOB:          13-Dec-1929    Michele [name] is seen in the Lipid Clinic for further evaluation of  medication titration associated with her difficulty in taking  antihyperlipidemic medications due to muscle pain. The patient noted  muscle pain after placement of her pacemaker. She did suffer a post  hospital bout with diverticulitis which has been treated by  ciprofloxacin as well as metronidazole. The patient has completed now a  2 week round of oral vancomycin therapy for breakthrough diarrhea on  metronidazole and Cipro. The patient still continues to have muscle pain  in spite of discontinuation of her cholesterol medication some time ago.  She follows a low-fat, low cholesterol diet and is limited in her  exercise most recently now due to her significant muscle pain.   PAST MEDICAL HISTORY:  1. As noted above and also includes:  2. History of thyroid nodules.  3. Status post pacemaker placement.  4. Diverticulosis.  5. Anxiety syndrome.   CURRENT MEDICATIONS:  1. Coumadin as directed.  2. Synthroid 75 mcg daily.  3. Vytorin 10/20 one tablet now discontinued.  4. Flax seed oil as directed.  5. Multivitamin daily.  6. Folic acid daily.  7. Celexa 20 mg daily.  8. Verapamil 120 mg extended release version daily.  9. Ziac daily.  10.Florastor.  11.Centrum Silver multivitamin.  12.Calcium.   PHYSICAL EXAMINATION:  Weight today is 172 pounds, blood pressure  148/80, heart rate 72.  Labs to evaluate the patient for rheumatologic or musculoskeletal issues  as well as gout are negative. A CK of note obtained at [facility] is 137.   ASSESSMENT:  The patient continues to have muscle pain which is still  present in spite of discontinuation of Vytorin some time ago. It  is  possible that there could have been a drug interaction with her  verapamil or with her antibiotics more recently. However, this is not  absolute.   PLAN:  The patient will begin co-enzyme Q10 100-150 mg daily for her  muscle pain. I will followup with the patient on November 24 at her  Coumadin recheck and if her pain is persistent or unchanged, or  improved, will consider initiation of a Hydrisalic statin at an  extremely low dose. I appreciate the opportunity to see this kind  patient. Thank you very much for the referral.      Shelby Dubin, PharmD, BCPS, CPP  Electronically Signed      Rollene Rotunda, MD, [facility]  Electronically Signed   MP/MedQ  DD: 12/29/2006  DT: 12/30/2006  Job #: [id number]   cc:   Michele C. Daleen Squibb, MD, [facility]  Michele Dubin. [name], MD,FACP,FCCP

## 2010-07-02 NOTE — Assessment & Plan Note (Signed)
[facility]                         ELECTROPHYSIOLOGY OFFICE NOTE   Michele [name], Michele [name]                     MRN:          [id number]  DATE:09/29/2006                            DOB:          31-Jan-1930    Michele [name] comes in today.  She has atrial fibrillation with a poorly  controlled ventricular response.  She has had intercurrent deterioration  of her LV systolic function so when it was last measured in May it had  gone from 45-50 down to 35-40.  She is largely asymptomatic.   She has had problems with bradycardia so she has not taken her rate-  controlling medications.   On examination today, her blood pressure was 116/84 with a pulse of 110  that was irregular.  Lungs were clear, heart sounds were irregular with  a 2/6 systolic murmur, and the extremities were without edema.   Electrocardiogram demonstrated atrial fibrillation with a rapid  ventricular response with a rate of 110.  The electrocardiogram was  otherwise normal.   IMPRESSION:  1. Atrial fibrillation - persistent.  2. Echocardiogram demonstrating left atrial enlargement at 4.7 cm and      deteriorating left ventricular systolic function.  3. Aversion to Tikosyn.  4. Propensity to bradycardia.  5. Nonischemic Myoview.   Michele [name] has atrial fibrillation that is persistent and now associated  with a cardiomyopathy.  I have suggested that rate control versus rhythm  control needs to be rather urgently pursued and she is not quite sure  what she would like to do.  In that vein, we have chosen to pursue  cardioversion to restore sinus rhythm to give [place] a little bit of time to  decide whether to take amiodarone with a backup brady pacemaker, or to  take a pacemaker and AV nodal blocking agent.  She is not willing at  this point to consider Tikosyn because of her desires to travel.  I do  not think that there is really going to be any way, with a pulmonary  vein procedure down the  road, that she will avoid pacing given her  propensity to bradycardia, and I have told her that.   Right now, we will plan to undertake cardioversion.  Consideration at  that time will have to be given towards admission for amiodarone.   She also is supposed to have some thyroid surgery for a thyroid cyst  although her TSH is normal, and I will have to get Dr. George Hugh input  on this.     Duke Salvia, MD, [facility]  Electronically Signed   SCK/MedQ  DD: 09/29/2006  DT: 09/29/2006  Job #: [id number]

## 2010-07-02 NOTE — Assessment & Plan Note (Signed)
[facility]                         GASTROENTEROLOGY OFFICE NOTE   DEASHA, CLENDENIN                     MRN:          [id number]  DATE:12/03/2006                            DOB:          03/24/1929    PROBLEM:  1. Diverticulitis.  2. Diarrhea.   HISTORY:  This is a pleasant 77 year-old white female known remotely to  Dr. [name] who had undergone colonoscopy in 2002 for followup of  adenomatous polyps.  She had mild sigmoid diverticular disease noted at  that time and internal hemorrhoids but no recurrent polyps.  She has not  been seen by GI since that time.  She says that she has never had a  previous episode of diverticulitis.  She underwent pacemaker placement  on October 26, 2006, and received one dose of IV antibiotics with that.  This was done for recurrent atrial fibrillation and nonischemic  cardiomyopathy.  She is on chronic Coumadin.  The patient says that  about 3 days after that procedure she developed lower abdominal  discomfort, primarily on the left side and was seen by Dr. [name],  underwent CT scan of the abdomen and pelvis which did show an acute  sigmoid diverticulitis and was treated with a 2 week course of Cipro and  Flagyl.  After she finished the antibiotics she started having problems  with diarrhea which has now been present for about 3 weeks.  Initially  her diarrhea was fairly severe with 8-10 bowel movements per day and  some nocturnal episodes of diarrhea, but now she says it has actually  improved without any specific treatment and is having about 4 loose  watery stools per day.  She has not had any fevers or chills, has been  mildly nauseated, no vomiting, and says she is not having any ongoing  abdominal discomfort, every once and a while has a twinge in her lower  abdomen.  She has noted increase in hemorrhoid symptoms with the  diarrhea and has been seeing some blood on the stool.  She also was  actually  having some difficulty urinating during the time that she had  such profuse diarrhea and said she felt a lot of pressure in her pelvic  area.  She was told several years ago that she did have a cystocele and  rectocele.  These symptoms have improved over the past few days.  She  has been urinating without difficulty.   Stool cultures per Dr. Frederik Pear office positive for C. diff on December 01, 2006.   CURRENT MEDICATIONS:  1. Coumadin 7.5 mg daily.  2. Synthroid 75 mcg six days per week.  3. Vytorin 10/20 daily.  4. Verapamil 120 mg twice daily.  5. Align once daily.  6. Vitamin D.  7. Centrum Silver.  8. Caltrate.  9. Flax seed oil.   ALLERGIES:  NO KNOWN DRUG ALLERGIES.   PAST HISTORY:  1. Paroxysmal atrial fibrillation, nonischemic cardiomyopathy status      post pacemaker placement October 26, 2006.  2. Hypothyroidism with thyroid nodules.  3. Dyslipidemia.  4. Status post hysterectomy, appendectomy and cholecystectomy.  FAMILY HISTORY:  Negative for GI disease.   SOCIAL HISTORY:  Patient is married, lives with her spouse.  She is a  nonsmoker, nondrinker.   REVIEW OF SYSTEMS:  Positive for lower extremity edema and recent atrial  fibrillation as outlined above, otherwise completely negative except for  GI.   PHYSICAL EXAMINATION:  Well-developed, white female in no acute  distress.  Height is 5 foot 3-1/2, weight is 167, blood pressure 102/68,  pulse is 78.  HEENT:  Nontraumatic/normocephalic, EOMI, PERRLA, sclerae are  nonicteric.  CARDIOVASCULAR:  Regular rate and rhythm with S1 and S2, there is a  pacemaker in the left chest wall.  No murmur, rub or gallop.  ABDOMEN:  Soft, bowel sounds are active, she is basically nontender,  there is no mass or hepatosplenomegaly.  RECTAL:  Hemoccult negative, external hemorrhoids, no internal lesions.   IMPRESSION:  64. A 77 year old female with acute sigmoid diverticulitis, now      resolved, initial episode.  2.  Clostridium difficile colitis secondary to antibiotic therapy.  3. Symptomatic external hemorrhoids.  4. Nonischemic cardiomyopathy and history of atrial fibrillation,      status post pacemaker placement September 2008, on chronic      Coumadin.   PLAN:  1. Treat C. diff with vancomycin 250 mg four times daily x14 days,      suspect this may be more effective as she contracted the C. diff      while on Flagyl in combination with Cipro.  2. Florastor 1 p.o. b.i.d. x2 weeks.  3. Return office visit in one month and at that time if her diarrhea      has cleared will decide about colonoscopy.      Mike Gip, PA-C  Electronically Signed      Venita Lick. [name], MD, [facility]  Electronically Signed   AE/MedQ  DD: 12/03/2006  DT: 12/04/2006  Job #: [id number]   cc:   [name]. [name], MD,FACP,FCCP

## 2010-07-02 NOTE — Assessment & Plan Note (Signed)
[facility]                         ELECTROPHYSIOLOGY OFFICE NOTE   TIANI, STANBERY                     MRN:          [id number]  DATE:10/30/2006                            DOB:          15-Jun-1929    Ms. Michele [name] is seen 4 days following pacemaker implantation.  Her husband  was concerned about erythema and it looks actually quite good today.   Electrocardiogram was done, somewhat inadvertently, but it did produce a  heart rate of 122.  I will have her double up her Toprol and she will  follow up next week as scheduled.     Duke Salvia, MD, [facility]  Electronically Signed    SCK/MedQ  DD: 10/30/2006  DT: 10/31/2006  Job #: [id number]

## 2010-07-02 NOTE — Op Note (Signed)
[name], [name] NO.:  [id number]   MEDICAL RECORD NO.:  [id number]          PATIENT TYPE:  OIB   LOCATION:  2807                         FACILITY:  [facility]   PHYSICIAN:  Duke Salvia, MD, FACCDATE OF BIRTH:  December 03, 1929   DATE OF PROCEDURE:  10/26/2006  DATE OF DISCHARGE:                               OPERATIVE REPORT   PREOPERATIVE DIAGNOSIS:  Tachybrady syndrome.   POSTOPERATIVE DIAGNOSIS:  Tachybrady syndrome.   PROCEDURE:  Dual-chamber pacemaker implantation.   Following obtaining informed consent, the patient was brought to the  electrophysiology laboratory and placed on the fluoroscopic table in  supine position.  After routine prep and drape of the left upper chest,  lidocaine was infiltrated in prepectoral subclavicular region.  An  incision was made and carried down to layer of the prepectoral fascia  using electrocautery.  A pocket was formed similarly.  Hemostasis was  obtained.   Thereafter attention was turned to gaining access to extrathoracic left  subclavian vein which was accomplished without difficulty without the  aspiration of air or puncture of the artery.  Two separate venipunctures  were accomplished.  Guidewires were placed and retained and 0 silk  suture was placed in a figure-of-eight fashion.  Subsequently 7-French  sheaths were placed through which were passed a Medtronic 5076 52-cm  active fixation ventricular lead serial number [id number] and a  Medtronic 5076 45-cm fixation atrial lead serial number [id number].  Ventricular lead was marked with a tie.  Ventricular lead was made to  the right ventricular septum where the bipolar R wave was 8.8 with a  pace impedance of 991 ohms, a threshold 1.2 volts at 0.5 milliseconds.  Current threshold 1.3 MA.  There is no diaphragmatic pacing at 10 volts  with a current of injury was  brisk.  The atrial lead was manipulated to  the right atrial appendage where bipolar fibrillation wave  ranged from  0.9 to 4.6 with an average about 2.1, the impedance was 685.  Current of  injury was adequate, the leads were secured prepectoral fascia.  The  leads were then attached to a Medtronic Adapta  ADDRL1 pulse generator,  serial number [id number].  The pocket was copiously irrigated with  antibiotic containing saline solution.  Hemostasis was assured.  The  leads and pulse generator were placed in the pocket, secured to the  prepectoral fascia.  Surgicel was used on the anterior and posterior  aspect of the wound because of a little bit of oozing from the fat and  the wound was then closed in three layers in normal fashion.  It was  washed and dried and a benzoin Steri-Strip dressing was applied.  Needle  counts, sponge counts and instrument counts were correct at end of  procedure according to staff.  The patient tolerated the procedure  without apparent complication.      Duke Salvia, MD, [facility]     SCK/MEDQ  D:  10/26/2006  T:  10/26/2006  Job:  [id number]   cc:   Electrophys lab

## 2010-07-02 NOTE — Op Note (Signed)
Michele [name], Michele [name]              ACCOUNT NO.:  [id number]   MEDICAL RECORD NO.:  [id number]          PATIENT TYPE:  INP   LOCATION:  2550                         FACILITY:  [facility]   PHYSICIAN:  Velora Heckler, MD      DATE OF BIRTH:  Sep 26, 1929   DATE OF PROCEDURE:  01/27/2007  DATE OF DISCHARGE:                               OPERATIVE REPORT   PREOPERATIVE DIAGNOSIS:  Bilateral thyroid nodules with calcification.   POSTOPERATIVE DIAGNOSIS:  Bilateral thyroid nodules with calcification.   PROCEDURE:  Total thyroidectomy.   SURGEON:  Velora Heckler, MD, FACS   ASSISTANT:  Magnus Ivan, RNFA   ANESTHESIA:  General per [name], M.D.   ESTIMATED BLOOD LOSS:  Minimal.   PREPARATION:  Betadine.   COMPLICATIONS:  None.   INDICATIONS:  The patient is a 77 year old white female from [place],  [place].  The patient was referred by Dr. Lona Kettle for  bilateral thyroid nodules.  Ultrasound demonstrated a 2.9-cm nodule in  the right lobe with calcifications.  The patient has had mild  compressive symptoms.  She now comes to surgery for resection.   BODY OF REPORT:  The procedure is done in OR #1 at the [facility].  The patient is brought to the operating room, placed  in a supine position on the operating room table.  Following  administration of general anesthesia, the patient is positioned and then  prepped and draped in usual strict aseptic fashion.  After ascertaining  that an adequate level of anesthesia had been obtained, a Kocher  incision was made with a #15 blade.  Dissection is carried through  subcutaneous tissues and hemostasis obtained with the electrocautery.  Skin flaps are elevated cephalad and caudad from the thyroid notch to  the sternal notch.  A Mahorner self-retaining retractor is placed for  exposure.  Strap muscles are incised in the midline.  Dissection is  carried down to the thyroid isthmus.  Dissection is begun on  the left  side of the neck.  Strap muscles are reflected laterally.  Left thyroid  lobe is exposed.  There is a dominant nodule in the inferior pole of the  left lobe.  Venous tributaries are divided between small and medium  Ligaclips with the harmonic scalpel.  Superior pole vessels are  dissected out and divided between medium Ligaclips with the harmonic  scalpel.  Inferior venous tributaries are divided between medium  Ligaclips with the harmonic scalpel.  Gland is rolled anteriorly.  Branches of the inferior thyroid artery are divided between small  Ligaclips.  Parathyroid tissue is preserved.  Recurrent laryngeal nerve  is identified and preserved.  Remaining branches of the inferior thyroid  artery are divided with the harmonic scalpel.  Ligament of Allyson Sabal is  transected with the electrocautery and the gland is rolled up and onto  the anterior trachea.  A small pyramidal lobe is dissected off of the  anterior thyroid cartilage with electrocautery and included with the  specimen.  Isthmus is mobilized across the midline.  A dry pack is  placed in the left neck.   Next we turned our attention to the right thyroid lobe.  Again strap  muscles are reflected laterally.  Using a Barista, the right  lobe is exposed.  There is a dominant cyst in the superior pole on the  right.  There is a dominant solid nodule in the inferior pole on the  right.  Venous tributaries are divided between medium Ligaclips with the  harmonic scalpel.  Superior pole vessels are dissected out and divided  between medium Ligaclips with the harmonic scalpel.  Inferior venous  tributaries are divided between medium Ligaclips with the harmonic  scalpel.  Gland is rolled anteriorly.  Parathyroid tissue is preserved.  Recurrent nerve is identified and preserved.  Branches of the inferior  thyroid artery are divided between small Ligaclips with the harmonic  scalpel.  Ligament of Allyson Sabal is transected with the  electrocautery and  the gland is rolled up and onto the anterior trachea, from which it is  completely excised with the harmonic scalpel.  Good hemostasis is noted.  A suture is used to mark the right superior pole of the specimen.  Gland  is submitted to pathology for review.  Neck is irrigated with warm  saline, which is evacuated.  Good hemostasis is noted bilaterally.  Surgicel is placed in the operative field.  Strap muscles are  reapproximated with interrupted 3-0 Vicryl sutures.  Platysma is closed  with interrupted 3-0 Vicryl sutures.  Skin is closed with a running 4-0  Vicryl subcuticular suture.  Wound is washed and dried and Benzoin and  Steri-Strips are applied.  Sterile dressings are applied.  The patient  is awakened from anesthesia and brought to the recovery room in stable  condition.  The patient tolerated the procedure well.      Velora Heckler, MD  Electronically Signed     TMG/MEDQ  D:  01/27/2007  T:  01/27/2007  Job:  [id number]   cc:   [name]. [name], MD,FACP,FCCP  Brooke Bonito, M.D.  Duke Salvia, MD, [facility]  Sean A. Everardo All, MD

## 2010-07-02 NOTE — Op Note (Signed)
Michele [name], Michele [name] NO.:  [id number]   MEDICAL RECORD NO.:  [id number]          PATIENT TYPE:  OIB   LOCATION:  2899                         FACILITY:  [facility]   PHYSICIAN:  Luis Abed, MD, FACCDATE OF BIRTH:  1929-03-05   DATE OF PROCEDURE:  10/07/2006  DATE OF DISCHARGE:                               OPERATIVE REPORT   The patient has atrial fibrillation and has been carefully prepared for  cardioversion.  Her INR is therapeutic.  Electrolytes are normal.   PROCEDURE:  Anesthesia was present.  The patient received 125 mg of IV  Pentothal.  Anterior-posterior pads were used with a biphasic  defibrillator.  The patient received 120 joules, and she converted to  normal sinus rhythm.  She is stable and waking up at this time.   Successful cardioversion at this point.  The patient will be allowed to  go home later this afternoon.      Luis Abed, MD, [facility]  Electronically Signed     JDK/MEDQ  D:  10/07/2006  T:  10/08/2006  Job:  [id number]   cc:   Duke Salvia, MD, [facility]

## 2010-07-05 NOTE — Assessment & Plan Note (Signed)
 [facility]                           ELECTROPHYSIOLOGY OFFICE NOTE   MALAYJAH, OTOOLE                     MRN:          [id number]  DATE:10/06/2005                            DOB:          1929/03/18    Ms. Nofziger is seen.  She has paroxysmal atrial fibrillation and significant  a.m. orthostatic intolerance.  Blood pressure has been recorded at home in  the 60-70 range.  She also has resting bradycardia.  She underwent Holter  monitoring a couple of weeks ago, which demonstrated atrial fibrillation and  atrial flutter with uncontrolled ventricular response.  She has, as noted,  sinus bradycardia, and the arrhythmias leave her quite fatigued.   PHYSICAL EXAMINATION:  GENERAL:  On examination today, her blood pressure is  124/70, pulse is 64.  LUNGS:  Clear.  CARDIAC:  Heart sounds were regular.  EXTREMITIES:  Without edema.   Interestingly, she did not have any orthostatic intolerance but she notes  that her symptoms are pretty benign during the day.   IMPRESSION:  1. Atrial fibrillation and atrial flutter with rapid ventricular response,      paroxysmal.  2. Sinus bradycardia.  3. Orthostatic intolerance with blood pressures recorded at home in the 60-      70 range.  4. Ejection fraction having intercurrently changed in the last year or two      from 60% to 40-45%.   I am concerned about the impact of her atrial arrhythmias.  To that end I  think we will need to consider antiarrhythmic drug therapy.  She asked  regarding RF catheter ablation but I have told her that most centers are  requiring pretreatment with an antiarrhythmic drug because of the  benefit/risk ratios.   In regard to her orthostatic intolerance, we are going to go ahead and give  her Proamatine to take at 2.5 mg in the morning before she gets up, and I  have asked her to raise the head of her bed about 6 inches.   In the event that she chooses antiarrhythmic drugs,  will probably undertake  the use of propafenone as she is going to be in tolerant of other AV nodal  blocking agents.   We will plan to see her again in 3 months' time, but she is to let us know  how she is doing.                                  Duke Salvia, MD, [facility]   SCK/MedQ  DD:  10/06/2005  DT:  10/07/2005  Job #:  [id number]

## 2010-07-05 NOTE — H&P (Signed)
NAME:  Michele [name], Michele [name]                        ACCOUNT NO.:  [id number]   MEDICAL RECORD NO.:  [id number]                   PATIENT TYPE:  EMS   LOCATION:  MAJO                                 FACILITY:  [facility]   PHYSICIAN:  Gordy Savers, M.D. [facility]      DATE OF BIRTH:  04-17-1929   DATE OF ADMISSION:  10/15/2003  DATE OF DISCHARGE:                                HISTORY & PHYSICAL   CHIEF COMPLAINT:  Weakness.   HISTORY OF PRESENT ILLNESS:  The patient is a 77 year old white female with  a history of paroxysm atrial fibrillation since July of 2002.  At that time,  she presented to the hospital with chest pain, shortness of breath, and  palpitations.  She was admitted to the hospital and spontaneously converted  to a normal sinus rhythm and discharged on aspirin and beta blocker therapy.  She has had episodes of paroxysm atrial fibrillation and subsequent to that  has been on chronic Coumadin.  She states that approximately six months ago  she had an emergency department visit and was noted to have severe  bradycardia at that time.  Apparently, she had been on verapamil in the past  but apparently this was not a factor with her bradyarrhythmia six months  ago.   Four days ago, the patient also had some brief palpitations and she was seen  by her primary care physician three days prior to admission.  She was in  normal sinus rhythm at that time.   On the morning of admission, she developed significant presyncopal symptoms  while taking a shower.  She became quite weak, dizzy, had dimming of her  vision, denied any palpitations or chest pain.  Her weakness was rather  profound to the point that she needed to lay down.  Husband had a difficult  time obtaining a blood pressure with a home monitoring device.  Due to her  progressive symptoms, she was evaluated in the emergency department where  she was noted to have atrial fibrillation with a ventricular response of  approximately  150.  She is now admitted to the hospital for further  evaluation and treatment.   PAST MEDICAL HISTORY:  Fairly unremarkable except for a history of paroxysm  atrial fibrillation.  She has been on chronic Coumadin for approximately two  years.  She has a history of treated hypothyroidism.  She had a right  rotator cuff repair on two occasions.   MEDICATIONS:  1. Coumadin 7.5 mg q.d.  2. Synthroid 0.075 mg q.d.   REVIEW OF SYMPTOMS:  Otherwise fairly unremarkable.  She states that she has  had an outpatient stress test which has been negative.  A 2-D echocardiogram  during her prior admission two years ago revealed normal LV function and  normal chamber sizes.   SOCIAL HISTORY:  She is married.  Nondrinker and nonsmoker.  Lives with her  husband who is retired from [employer].   PHYSICAL EXAMINATION:  GENERAL:  Well-developed, healthy-appearing white  female.  Alert and cooperative in no distress.  Nasal oxygen in place.  VITAL SIGNS:  Blood pressure 100/50, pulse variable between 90 and 100.  HEENT:  Normal fundi.  Ears, nose, and throat clear.  NECK:  No neck vein distention and no bruits.  CHEST:  Clear.  CARDIOVASCULAR:  Irregular rhythm.  No murmur or gallop noted.  ABDOMEN:  Soft and nontender.  EXTREMITIES:  No edema.  PULSES:  Peripheral pulses were intact.   IMPRESSION:  1. Paroxysm atrial fibrillation, history of bradyarrhythmia:  Rule out sick     sinus syndrome.  2. Treated hypothyroidism.   DISPOSITION:  The patient will be admitted to the hospital for rate control.  A follow-up 2-D echocardiogram will be reviewed.  Serial cardiac enzymes  will also be obtained as well as a TSH.  Prothrombin time will also be  checked.  The patient will be seen in consultation by cardiology and will be  considered for a maintenance antiarrhythmic drug.                                                Gordy Savers, M.D. [facility]    PFK/MEDQ  D:  10/15/2003  T:  10/15/2003   Job:  [id number]

## 2010-07-05 NOTE — Assessment & Plan Note (Signed)
[facility]                           ELECTROPHYSIOLOGY OFFICE NOTE   LADAWNA, WALGREN                     MRN:          [id number]  DATE:01/01/2006                            DOB:          07-20-1929    Ms. Mcnee comes in.  She is in atrial fibrillation today with a rapid  ventricular response at 125.  She is unaware.   We have had discussions over the last month about what to do about her rapid  atrial fibrillation and is troubled by the fact that she is proned to  bradycardia.   We have discussed again the proarrhythmic issues related to propafenone and  Tikosyn.  She would prefer the former.  We also discussed the potential use  of an ISA beta blocker and she would prefer again the former.   EXAMINATION:  Her blood pressure today was 122/82.  Her pulse was 125.  LUNGS:  Clear.  Heart sounds were irregular.  EXTREMITIES:  Without edema.   Electrocardiogram today demonstrated the atrial fibrillation with intervals  of - /0.08/0.33 with an axis of 33 degrees.   IMPRESSION:  1. Atrial fibrillation - paroxysmal now persistent with a rapid      ventricular response.  2. Sinus bradycardia.  3. Orthostatic intolerance for which she was taking Proamatine but has      discontinued this.  I failed to ask her whether she had raised the head      of her bed 6 inches.   We will plan to begin her on propafenone at 225 mg b.i.d. and we will bring  her next week for a treadmill for the evaluation of proarrhythmia.   I will see her again in 3 months' time.     Duke Salvia, MD, [facility]  Electronically Signed    SCK/MedQ  DD: 01/01/2006  DT: 01/01/2006  Job #: [id number]   cc:   [name]. [name], MD,FACP,FCCP

## 2010-07-05 NOTE — Assessment & Plan Note (Signed)
[facility]                         ELECTROPHYSIOLOGY OFFICE NOTE   DAVINITY, FANARA                     MRN:          [id number]  DATE:05/05/2006                            DOB:          1929/11/08    Michele [name] shows up.  She continues to complain of palpitations.  Since we last saw her in November, her daughter-in-law has been  diagnosed with breast cancer; she has undergone surgery, repeat surgery  and is now undergoing chemotherapy, which she is tolerating poorly, and  she and her husband are running back and forth up and down the highways.  We also discussed the difficulties that there have been since her  husband retired and is now home.   Her palpitations have been more notable.  On reviewing her chart, she  has had significant bradycardia; this was on verapamil, though she  thinks now that she was eating grapefruit at the time.  However, not  withstanding, her heart rate was 50 or so in August; a couple of weeks  before it had been 150 and in atrial flutter.   CURRENT MEDICATIONS:  1. Synthroid at 75 mcg.  2. Coumadin.  3. Vytorin.   PHYSICAL EXAMINATION:  Today, her blood pressure was 118/82; her pulse  was 105 and irregular.  LUNGS:  Clear.  HEART:  Sounds were irregular.  EXTREMITIES:  Without edema.   ELECTROCARDIOGRAM:  Electrocardiogram today demonstrated atrial  fibrillation at 105 with intervals of 0.08/0.35 with an axis at 30  degrees.   IMPRESSION:  1. Atrial fibrillation, persistent, now with rapid ventricular      response.  2. History of bradycardia, very slow on verapamil, plus/minus      grapefruit toxicity as well as slow on sinus rhythm at 55.   I am going to take a little bit of a leap here and put her pindolol 5 mg  twice daily.  I plan to see her again in a couple of months and she is  advised to call us if she has any untoward symptoms associated with the  above, specifically lightheadedness and  such.     Duke Salvia, MD, [facility]  Electronically Signed    SCK/MedQ  DD: 05/05/2006  DT: 05/06/2006  Job #: [id number]

## 2010-07-05 NOTE — Op Note (Signed)
[place]  Patient:    Michele [name], Michele [name]                       MRN: [id number] Proc. Date: 06/22/00 Adm. Date:  [id number] Attending:  Twana First                           Operative Report  PREOPERATIVE DIAGNOSIS:  Right shoulder rotator cuff tear.  POSTOPERATIVE DIAGNOSIS: 1. Right shoulder rotator cuff tear. 2. Right shoulder partial biceps tendon tear.  PROCEDURE: 1. Right shoulder examination under anesthesia followed by arthroscopic    partial biceps tendon tear debridement. 2. Right shoulder open rotator cuff repair.  SURGEON:  Elana Alm. Thurston Hole, M.D.  ASSISTANT:  Kirstin A. Shepperson, P.A.  ANESTHESIA:  General.  OPERATIVE TIME:  45 minutes.  COMPLICATIONS:  None.  INDICATIONS FOR PROCEDURE:  Michele [name] is a 77 year old woman who has had significant right shoulder pain for the past three to four months increasing in nature with signs and symptoms, and arthrogram documenting a rotator cuff tear. She has failed conservative care and is now to undergo arthroscopy and rotator cuff repair.  DESCRIPTION OF PROCEDURE:  Michele [name] was brought to the operating room on Jun 22, 2000 and placed on the operative table in the supine position. After an adequate level of general anesthesia was obtained her right shoulder was examination under anesthesia. Forward flexion of 175, abduction of 175, internal and external rotator of 80 degrees. The shoulder was stable ligamentous exam. After this was done, she was placed in the beach chair position. Her shoulder and arm were prepped using sterile Betadine, and draped using sterile technique. Originally, through a posterior arthroscopic portal the arthroscope with a pump attached was placed. Through an anterior portal an arthroscopic probe was placed. On initial inspection the articular cartilage in the glenohumeral joint showed mild grade I and II chondromalacia. The anterior labrum was intact.  The anterior inferior labrum and anterior inferior glenohumeral ligaments were intact. The superior labrum was intact. The biceps tendon showed a partial tear of 30 to 40% in the mid portion, which was debrided, but it was otherwise intact. The posterior labrum was intact. The biceps tendon anchor was intact. The rotator cuff showed a tear of the supraspinatus just posterior to the biceps tendon and this was partially debrided arthroscopically. The subscapularis tendon, which had been previously repair two years ago, was still intact. A lateral arthroscopic portal was made and the subacromial space was entered. The rotator cuff was visualized and found to have this tear in the supraspinatus, less than a centimeter in length. A small deltoid splitting mini open incision was made and two separate #1 panacryl sutures were placed in the tendon tear, after debridement had been carried out and the tear was repaired primarily, tendon to tendon. After this was done, and a small subacromial decompression had been carried out with a 6 mm burr, then the shoulder could be brought through a full range of motion with no impingement on the repair. The wound was then irrigated and closed using 2-0 Vicryl to close the deltoid fascia and subcutaneous tissues. 3-0 Prolene in the subcuticular layer. Steri-Strips were applied. The wound was injected with 0.25% Marcaine with epinephrine. Sterile dressings and a sling was applied. The patient was awakened and taken to the recovery room in a stable condition.  FOLLOWUP CARE:  [name] will be followed over  night at the recovery care center for IV pain control and neurovascular monitoring. Discharge tomorrow on Percocet and Naprosyn. See me back in the office in a week for sutures out and followup. DD:  06/22/00 TD:  06/22/00 Job: [id number]

## 2010-07-05 NOTE — H&P (Signed)
Michele [name], MORRISSEY              ACCOUNT NO.:  [id number]   MEDICAL RECORD NO.:  [id number]          PATIENT TYPE:  INP   LOCATION:  4740                         FACILITY:  [facility]   PHYSICIAN:  Gordy Savers, M.D. LHCDATE OF BIRTH:  [birth year]   DATE OF ADMISSION:  10/15/2003  DATE OF DISCHARGE:  10/16/2003                                HISTORY & PHYSICAL   CHIEF COMPLAINT:  Weakness and lightheadedness.   HISTORY OF PRESENT ILLNESS:  The patient is a 77 year old white female with  a history of paroxysmal atrial fibrillation of two years duration. She has  been followed by [facility] and has been on chronic Coumadin and  followed in the Coumadin Clinic.  She has a history also of treated  hypothyroidism. She presented to the emergency room after episodes of  weakness to the point where she felt that she was going to pass out. She  developed nausea, lightheadedness, and some headaches on at least one  occasion. She had visual dimming and possible brief loss of consciousness  for one to three minutes. She denied any chest pain, shortness of breath, or  palpitations. She presented to the emergency room where she was noted to  have atrial fibrillation with rapid ventricular response and is now admitted  for further evaluation and treatment.   PAST MEDICAL HISTORY:  Pertinent also for a right rotator cuff repair on two  occasions. In July 2002 she had a negative 2-D echocardiogram. She was also  admitted at that time for atrial fibrillation and placed on Coumadin at that  time.   SOCIAL HISTORY:  Married, accompanied by her husband. She is a nondrinker,  nonsmoker.   REVIEW OF SYSTEMS:  Otherwise, noncontributory.   PHYSICAL EXAMINATION:  GENERAL: A healthy-appearing, well-nourished, elderly  white female in no acute distress.  HEAD/NECK: No evidence of trauma. Pupillary responses are normal. ENT  unremarkable.  NECK: No bruits or adenopathy.  CHEST: Clear. O2  saturation 100% on room air.  CARDIAC: Normal S1 and S2. Rhythm was irregular and read initially at about  150.  ABDOMEN: Soft, flat, and nontender with no organomegaly.  EXTREMITIES: No edema.  NEUROLOGIC: Nonfocal.   LABORATORY DATA:  Therapeutic INR of 2.6.  EKG revealed atrial fibrillation  with rapid ventricular response.   IMPRESSION:  1.  Atrial fibrillation with rapid ventricular response.  2.  History of bradyarrhythmias.  3.  Hypothyroidism.   DISPOSITION:  The patient will be admitted to a telemetry setting. She will  be maintained on Coumadin and rate-controlled drugs. The patient will be  seen in consultation by cardiology for further evaluation.  Serial cardiac  enzymes will be reviewed.       PFK/MEDQ  D:  11/14/2003  T:  11/14/2003  Job:  [id number]

## 2010-07-05 NOTE — Discharge Summary (Signed)
[place]  Patient:    Michele [name], Michele [name]                       MRN: [id number] Adm. Date:  08/30/00 Disc. Date: 08/31/00 Attending:  Jesse Sans. Wall, M.D. [facility] Dictator:   [facility] CC:         [name]. [name], M.D. [facility]  Maisie Fus C. Wall, M.D. [facility]   Discharge Summary  DISCHARGE DIAGNOSES: 1. Supraventricular tachycardia. 2. Treated hypothyroidism.  HOSPITAL COURSE:  The patient presented to the [facility] Emergency Room on August 30, 2000 by ambulance.  That morning while sitting in a chair, she suddenly developed a feeling of palpitation.  This was accompanied by nausea and general weakness.  Symptoms worsened after approximately 30 minutes and also included some chest tightness and shortness of breath.  She took a blood pressure at home and it showed a blood pressure of 154/124 and a heart rate of 158.  After contacting her primary care physician, she called the ambulance.  In the ambulance, her heart rate continued up to approximately 160 with a blood pressure of 150/100.  She was given 6 mg of IV Adenosine followed by an additional 12 mg of IV Adenosine which slowed the rhythm enough to show it to be in atrial fibrillation.  She stated that she had a similar episode the prior week; however, it lasted approximately 30 minutes or less.  In the emergency room, the patient spontaneously converted to a normal sinus rhythm of approximately 72.  Her blood pressure at that time was 118/71.  She was seen and admitted by Dr. Jesse Sans. Wall.  His plan was to obtain an echocardiogram the following morning, and if it was normal, to release the patient on aspirin and beta blocker.  He also checked a TSH level and if it was elevated he planned to start the patient on Coumadin until she became euthyroid.  The next day, the patient underwent 2-D echocardiogram.  It showed normal left ventricular function and no mitral  regurgitation.  Dr. Rollene Rotunda noted that the patients TSH was low at 0.167 and recommended an adjustment in that level.  Otherwise, she felt she was stable for discharge.  DISCHARGE MEDICATIONS: 1. Enteric-coated aspirin 325 mg q.d. 2. Synthroid 0.112 mg on Mondays and Fridays followed by 0.056 mg or 1/2    tablet on the other days. 3. Toprol XL 25 mg q.d.  DISCHARGE INSTRUCTIONS:  The patient was advised to return to a normal level of activity.  She was to follow a low fat diet.  She was to follow up with Dr. [name]. Hopper as needed or scheduled.  LABORATORY DATA:  Sodium 142, potassium 3.9, chloride 109, CO2 25, BUN 13, creatinine 0.9.  White count 5.8, hemoglobin 13.9, hematocrit 38.9, platelets 243,000.  Cardiac enzymes were negative for MI.  Of note, her troponin peaked at 0.06 and this was suspected to be secondary to the tachycardia. DD:  08/31/00 TD:  08/31/00 Job: [id number]

## 2010-07-05 NOTE — Assessment & Plan Note (Signed)
[facility]                           ELECTROPHYSIOLOGY OFFICE NOTE   MONEKA, MCQUINN                     MRN:          [id number]  DATE:09/17/2005                            DOB:          [birth year]    [name] comes in for routine followup.  She was found on  electrocardiogram to have atrial fibrillation with a rapid ventricular  response, of which she is unaware.  She is doing quite well.  She does have  symptoms with her atrial fibrillation, which she characterizes as flushing.  This is worrisome to her husband.   Her thromboembolic risk factors are negative with a [place] score of 0.   She does take Coumadin, however.  She is on Synthroid also.   She has a propensity to bradycardia.   I should also note that her problems with dizziness and orthostatic  intolerance are currently well balanced by her taking her  hydrochlorothiazide every other day.   PHYSICAL EXAMINATION:  VITAL SIGNS:  On examination today, her blood  pressure was well controlled at 105/69, pulse irregular at 122.  LUNGS:  Clear.  HEART:  Heart sounds were irregular, as noted.   Electrocardiogram demonstrated atrial fibrillation with a rapid ventricular  response.   IMPRESSION:  1.  Paroxysmal atrial fibrillation, which is largely asymptomatic.  2.  Thromboembolic risk factors notable for borderline age and gender, on      Coumadin.  3.  Symptomatic flushing with rapid atrial fibrillation.   The issues, I think, are two-fold.  One is to what degree should we modify  her symptoms, and the second is are there other untoward consequences of her  rapid atrial fibrillation.  The latter, specifically in the presence of  Coumadin, is related to left ventricular dysfunction, so we will plan to  look at an echo, as it has been more than 2 years since her last ultrasound.   As related to the former, I have given her a prescription for Inderal 10 to  take as needed for  significant symptoms.   Will plan to see her again in 3-4 months' time.                                   Duke Salvia, MD, [facility]  SCK/MedQ  DD:  09/17/2005  DT:  09/17/2005  Job #:  [id number]   cc:   [name]. [name], MD, FCCP

## 2010-07-05 NOTE — Discharge Summary (Signed)
NAMECAREENA, Michele [name]              ACCOUNT NO.:  [id number]   MEDICAL RECORD NO.:  [id number]          PATIENT TYPE:  INP   LOCATION:  4740                         FACILITY:  [facility]   PHYSICIAN:  Rene Paci, M.D. LHCDATE OF BIRTH:  11-12-1929   DATE OF ADMISSION:  10/15/2003  DATE OF DISCHARGE:  10/16/2003                                 DISCHARGE SUMMARY   DISCHARGE DIAGNOSES:  Atrial fibrillation with rapid ventricular response.   BRIEF ADMISSION HISTORY:  [name] is a 77 year old white female with a  history of paroxysmal atrial fibrillation who presented to the ER after near  syncopal episode.  The patient was noted to have atrial fibrillation with  rapid ventricular response.   PAST MEDICAL HISTORY:  1.  Paroxysmal atrial fibrillation.  2.  Chronic Coumadin.  3.  Hypothyroidism.  4.  Status post right rotator cuff repair x2.  5.  History of unremarkable 2-D echocardiogram in July of 2002.   HOSPITAL COURSE:  CARDIOVASCULAR:  The patient presented with rapid atrial  fibrillation.  The patient's rate was controlled with Cardizem drip.  The  patient's anticoagulation was therapeutic.  Her TSH was normal.  Patient has  a history of brady arrhythmias and a question of sick sinus syndrome and has  been intolerant to verapamil in the past.  Patient was seen in consultation  by cardiology.  Dr. Graciela Husbands saw the patient.  He felt that she was stable for  discharge for an outpatient Cardiolite.  If her echocardiogram was okay and  her Cardiolite were okay then he recommended starting Rhythmol.   DISCHARGE MEDICATIONS:  None.   FOLLOWUP:  Scheduled for follow-up for a stress Cardiolite Tuesday, August  30 at 1:30 p.m.      Laur   LC/MEDQ  D:  12/11/2003  T:  12/11/2003  Job:  [id number]   cc:   Duke Salvia, M.D.   [name]. [name], M.D. [facility]

## 2010-07-05 NOTE — Consult Note (Signed)
NAME:  Michele [name], Michele [name]                        ACCOUNT NO.:  [id number]   MEDICAL RECORD NO.:  [id number]                   PATIENT TYPE:  INP   LOCATION:  4740                                 FACILITY:  [facility]   PHYSICIAN:  Duke Salvia, M.D.               DATE OF BIRTH:  [birth year]   DATE OF CONSULTATION:  10/16/2003  DATE OF DISCHARGE:                                   CONSULTATION   REASON FOR CONSULTATION:  [name] is seen at the request of Dr. Eden Emms  and Dr. Felicity Coyer for syncope in the context of atrial fibrillation.   HISTORY OF PRESENT ILLNESS:  Michele [name] is a 77 year old woman with past  medical history notable for treated hypothyroidism and atrial fibrillation  that is has been paroxysmal.  She takes Coumadin.  Her thromboembolic risk  factors are negative for hypertension and diabetes.   Over the last two years she has had recurrent episodes of atrial  fibrillation.  She is aware of it by palpation and by some palpitations.  Notably, however, she has also had recurrent episodes of lightheadedness  which is somewhat orthostatic and aggravated by hot showers.  This has  occurred solely when she has noted by palpation that she is out of rhythm.  The episodes of being out of rhythm can occur relatively frequently and is  variable duration.   Last week she had an episode of syncope while in the kitchen.  She bent over  to pick something up and then felt that things were going black.  She became  nauseated, diaphoretic and quite pale.  She had to lay down and evidently  the spell gradually abated.   During that spell, her husband tried to take her blood pressure and could  not palpate a pulse.  On the morning of admission, she had another episode.  She became presyncopal in the shower.  She came out.  She sat on the  commode.  She was helped to her bed where she lay down and again there was  no detectable pulse and no discernible blood pressure.  On arrival in the  emergency room, she was in rapid atrial fibrillation.  Her blood pressure  per the patient was not detectable in the holding area, though the first  recorded blood pressure in the emergency room was of 111.  However, after  she had been there for a couple of hours and was sitting up, she became  lightheaded, nauseated, diaphoretic, and pale and her blood pressure fell to  50.  She was subsequently admitted to the hospital.  She was reverted to  sinus rhythm.  She has a history of some orthostatic hypotension.  She  denies hypertension as noted.   PAST MEDICAL HISTORY:  Largely negative apart from the above.   PAST SURGICAL HISTORY:  Notable for rotator cuff surgery and hysterectomy.  She also had an appendectomy, cholecystectomy and cesarean  section.   SOCIAL HISTORY:  She is married.  I am told she does not use cigarettes or  alcohol or recreational drugs.   CURRENT MEDICATIONS:  1.  Coumadin.  2.  Synthroid 75 mg.  3.  Cardizem 30 t.i.d.   ALLERGIES:  No known drug allergies.   PHYSICAL EXAMINATION:  GENERAL:  She is an elderly, Caucasian female in no  acute distress.  VITAL SIGNS:  Her blood pressure lying is 152/82 with a pulse of 59, sitting  0 minutes it is 132/68 with a pulse of 58.  Standing at 0 minutes it is  132/64 with a pulse of 50 and standing at three minutes it was 120/54 with a  pulse of 54.  HEENT:  Demonstrated no icterus nor xanthoma.  NECK:  Carotids are brisk and full bilaterally without bruits.  BACK:  Without kyphosis or scoliosis.  LUNGS:  Clear.  HEART:  Regular with an S4.  ABDOMEN: Soft with active bowel sounds without midline pulsation or  hepatomegaly.  Femoral pulses are 2+.  Distal pulses were intact.  EXTREMITIES:  No clubbing, cyanosis or edema.  NEUROLOGIC:  Grossly norma.   IMPRESSION:  1.  Paroxysmal atrial fibrillation with a rapid ventricular response.  2.  Sinus bradycardia.  3.  History of hypothyroidism.  4.  Normal left  ventricular function by echo two years ago.  5.  Mild orthostatic hypotension.   DISCUSSION:  Mrs. Harold has lightheadedness and syncope in the context of  palpated irregularities which are consistent with atrial fibrillation and  this episode of syncope that has been temporally associated with atrial  fibrillation.  However, notably in the emergency room yesterday she had  another episode of presyncope where her blood pressure was measured in the  50s.  This occurred while she was sitting without any discernible change in  her heart rhythm. Her heart rate was recorded at that time at 90.  Whether  this represents a vagal slowing or was part of the initial heart rate, we  have no way of knowing.  This suggests that there is a strong neurally  mediated component to her lightheadedness that may well be triggered by the  atrial fibrillation either directly as the rhythm or indirectly via  decreased LV filling.   Because of this, rhythm control of her atrial fibrillation offers [place] our  best chance of controlling her significant symptoms.  We have discussed the  potential benefits as well as potential risks including __________ drug risk  of things like Rythmol and the potential for death of 1-3 per 1000 per year.  She understands these risks and is willing to proceed.   The advantage of Rythmol over flecainide is that it will hopefully not  require adjunctive AV nodal blocking therapy and this will hopefully obviate  the need for.  We will have to wait and see.   RECOMMENDATIONS:  1.  Based on the above, therefore, allow her to be discharged.  2.  Outpatient Cardiolite scan.  3.  Begin flecainide SR following the review of her ultrasound as well as      her Cardiolite.  4.  Outpatient treadmill test after initiation and full loading of her      Rythmol.  5.  Outpatient Holter to assess for relative bradycardia.  Thank you for the consultation.  Duke Salvia, M.D.    SCK/MEDQ  D:  10/16/2003  T:  10/16/2003  Job:  [id number]   cc:   [name]. [name], M.D. [facility]

## 2010-07-05 NOTE — Consult Note (Signed)
NAME:  Michele [name], Michele [name]                        ACCOUNT NO.:  [id number]   MEDICAL RECORD NO.:  [id number]                   PATIENT TYPE:  INP   LOCATION:  4740                                 FACILITY:  [facility]   PHYSICIAN:  Charlton Haws, M.D.                  DATE OF BIRTH:  [birth year]   DATE OF CONSULTATION:  DATE OF DISCHARGE:                                   CONSULTATION   REFERRING PHYSICIAN:  Doug Sou, M.D.   Michele [name] is a delightful 77 year old patient of Dr. Daleen Squibb and Dr. Graciela Husbands. She  has a history of paroxysmal atrial fibrillation.  She is on chronic  Coumadin.  She apparently follows up at our Coumadin clinic.  She was  admitted to the hospital with recurrent sick sinus syndrome and paroxysmal  atrial fibrillation.   In talking to the patient she has had probably two to three episodes over  the last year.  She is intolerant to Verapamil and has a positive  excessively slow pulse and blood pressure.  She notices that her pulse can  be as low as the mid 30s when she is at home.   The patient was admitted on October 15, 2003, with near syncopal symptoms  with atrial fibrillation and a rapid rate.   She is ruled out for myocardial infarction.  She is going for an  echocardiogram.  In talking to the patient, she has not had significant  coronary or valvular heart disease in the past.  She does not have any  significant bleeding diathesis.  She did not have any significant shortness  of breath or chest pain with this episode.   REVIEW OF SYMPTOMS:  Otherwise benign.  The patient is retired with her  husband.  He worked for [employer].  They enjoy traveling.  They have three  children here in the states and one still in [place], [place], where they  are originally from.   FAMILY HISTORY:  Positive for myocardial disease in the husband's side.   PHYSICAL EXAMINATION:  VITAL SIGNS:  She is in atrial fibrillation at a rate  of 90.  NECK:  Carotids normal.  LUNGS:   Clear.  CARDIOVASCULAR:  There is normal S1 and S2, normal heart sounds.  ABDOMEN:  Benign.  EXTREMITIES:  Without clubbing, cyanosis, or edema   EKG shows atrial fibrillation with nonspecific STT wave changes.   I do not have her lab work in front of me but apparently her INR was  therapeutic on admission.   IMPRESSION:  Paroxysmal atrial fibrillation with sick sinus syndrome.  The  patient has seen by Dr. Ladona Ridgel and Dr. Graciela Husbands before.  I think they should  see her again in regard to possibility of pacemaker therapy plus/minus  ablation.  There is also an issue of possible Tikosyn therapy since most  other antiarrhythmics would cause excessive bradycardia without a pacemaker.   I suspect  the patient has been therapeutic on her INRs and could undergo  inpatient cardioversion in the next 48 hours if she does not convert on her  own.  I will leave this decision up to Dr. Daleen Squibb.  She will have a follow-up  echocardiogram  now to make sure that she has normal LV function since there  may be a decision made in regard to antiarrhythmic therapy.  Further  recommendations will be given by our EP doctors and Dr. Daleen Squibb in the morning.                                               Charlton Haws, M.D.    PN/MEDQ  D:  10/16/2003  T:  10/16/2003  Job:  [id number]   cc:   Thomas C. Wall, M.D.   Doug Sou, M.D.  [address]  [place] [address]  Fax: [phone]

## 2010-07-08 ENCOUNTER — Other Ambulatory Visit: Payer: Self-pay | Admitting: Internal Medicine

## 2010-07-16 ENCOUNTER — Encounter: Payer: Medicare Other | Admitting: *Deleted

## 2010-07-18 ENCOUNTER — Ambulatory Visit (INDEPENDENT_AMBULATORY_CARE_PROVIDER_SITE_OTHER): Payer: Medicare Other | Admitting: *Deleted

## 2010-07-18 DIAGNOSIS — I4891 Unspecified atrial fibrillation: Secondary | ICD-10-CM

## 2010-07-25 ENCOUNTER — Other Ambulatory Visit: Payer: Self-pay

## 2010-07-25 ENCOUNTER — Ambulatory Visit (INDEPENDENT_AMBULATORY_CARE_PROVIDER_SITE_OTHER): Payer: Medicare Other | Admitting: *Deleted

## 2010-07-25 DIAGNOSIS — I509 Heart failure, unspecified: Secondary | ICD-10-CM

## 2010-07-25 DIAGNOSIS — I428 Other cardiomyopathies: Secondary | ICD-10-CM

## 2010-08-01 NOTE — Progress Notes (Signed)
icd remote check w/icm  

## 2010-08-02 ENCOUNTER — Encounter: Payer: Self-pay | Admitting: *Deleted

## 2010-08-15 ENCOUNTER — Ambulatory Visit (INDEPENDENT_AMBULATORY_CARE_PROVIDER_SITE_OTHER): Payer: Medicare Other | Admitting: *Deleted

## 2010-08-15 DIAGNOSIS — I4891 Unspecified atrial fibrillation: Secondary | ICD-10-CM

## 2010-09-12 ENCOUNTER — Ambulatory Visit (INDEPENDENT_AMBULATORY_CARE_PROVIDER_SITE_OTHER): Payer: Medicare Other | Admitting: *Deleted

## 2010-09-12 DIAGNOSIS — I4891 Unspecified atrial fibrillation: Secondary | ICD-10-CM

## 2010-09-18 ENCOUNTER — Encounter: Payer: Self-pay | Admitting: Internal Medicine

## 2010-09-19 ENCOUNTER — Encounter: Payer: Self-pay | Admitting: Internal Medicine

## 2010-10-03 ENCOUNTER — Telehealth: Payer: Self-pay | Admitting: *Deleted

## 2010-10-03 NOTE — Telephone Encounter (Signed)
Pt spouse called to inform us that pt is presently on Cipro BID and Flagyl QID, both meds started yesterday. Thus, due to the weekend pt s/c here in clinic tomorrow.

## 2010-10-04 ENCOUNTER — Ambulatory Visit (INDEPENDENT_AMBULATORY_CARE_PROVIDER_SITE_OTHER): Payer: Medicare Other | Admitting: *Deleted

## 2010-10-04 DIAGNOSIS — I4891 Unspecified atrial fibrillation: Secondary | ICD-10-CM

## 2010-10-09 ENCOUNTER — Encounter: Payer: Medicare Other | Admitting: *Deleted

## 2010-10-10 ENCOUNTER — Encounter: Payer: Medicare Other | Admitting: *Deleted

## 2010-10-16 ENCOUNTER — Ambulatory Visit (INDEPENDENT_AMBULATORY_CARE_PROVIDER_SITE_OTHER): Payer: Medicare Other | Admitting: *Deleted

## 2010-10-16 ENCOUNTER — Other Ambulatory Visit: Payer: Self-pay | Admitting: Internal Medicine

## 2010-10-16 ENCOUNTER — Telehealth: Payer: Self-pay

## 2010-10-16 DIAGNOSIS — R197 Diarrhea, unspecified: Secondary | ICD-10-CM

## 2010-10-16 DIAGNOSIS — I4891 Unspecified atrial fibrillation: Secondary | ICD-10-CM

## 2010-10-16 NOTE — Telephone Encounter (Signed)
Dr. Nicholas Lose spoke with Dr. Marina Goodell and pt has h/o diverticulitis and c.diff. Pt has recently been treated with Cipro and Flagyl. Now the pt has diarrhea. Pt to come to the lab for stool specimen for c-diff today and scheduled to see Mike Gip PA 10/17/10 @10am . Spoke with pts husband and he is aware of appts.

## 2010-10-17 ENCOUNTER — Other Ambulatory Visit: Payer: Medicare Other

## 2010-10-17 ENCOUNTER — Ambulatory Visit (INDEPENDENT_AMBULATORY_CARE_PROVIDER_SITE_OTHER): Payer: Medicare Other | Admitting: Physician Assistant

## 2010-10-17 ENCOUNTER — Encounter: Payer: Self-pay | Admitting: Physician Assistant

## 2010-10-17 VITALS — BP 118/70 | HR 88 | Ht 64.0 in | Wt 161.0 lb

## 2010-10-17 DIAGNOSIS — R197 Diarrhea, unspecified: Secondary | ICD-10-CM

## 2010-10-17 DIAGNOSIS — Z8619 Personal history of other infectious and parasitic diseases: Secondary | ICD-10-CM

## 2010-10-17 MED ORDER — DICYCLOMINE HCL 10 MG PO CAPS: ORAL_CAPSULE | ORAL | Status: DC

## 2010-10-17 NOTE — Patient Instructions (Signed)
We sent a prescription for Bentyl 10 mg to [facility]. Take as directed.  We will call you regarding the Stool study results.

## 2010-10-17 NOTE — Progress Notes (Addendum)
Subjective:    Patient ID: Michele [name], female    DOB: Sep 23, 1929, 77 y.o.   MRN: [id number]  HPI Michele [name] is a very nice 77 year old white female known to Dr. [name]. She has history of recurrent diverticulitis, and also has had 2 episodes of C. difficile colitis. She was treated last fall for C. difficile with metronidazole and then resolved with a course of vancomycin. She had onset about 3-1/2 weeks ago with crampy lower abdominal pain and loose stools and was started on a course of Cipro and Flagyl for recurrent diverticulitis by Dr. Nicholas Lose. She says she finished the antibiotics this past weekend about 5 days ago . She states that her diarrhea got progressively worse while she was on the antibiotics. At this point she is not having any fevers, has had occasional sweats during the day, her appetite is fine, she was mildly nauseated on antibiotics it but feels better at this point. She has only occasional nagging lower abdominal discomfort and says that she's been having 8-10 bowel movements per day- loose,non bloody stools She is not having nocturnal episodes. She has been limiting her by mouth intake  to slow her diarrhea. She has remained on florastor twice daily since her last episode.    Review of Systems  Constitutional: Positive for fatigue.  HENT: Negative.   Eyes: Negative.   Respiratory: Negative.   Cardiovascular: Negative.   Gastrointestinal: Positive for diarrhea.  Genitourinary: Negative.   Musculoskeletal: Negative.   Skin: Negative.   Neurological: Negative.   Hematological: Bruises/bleeds easily.  Psychiatric/Behavioral: Negative.    Outpatient Prescriptions Prior to Visit  Medication Sig Dispense Refill  . Biotin 1000 MCG tablet Take 1,000 mcg by mouth daily.        . Cholecalciferol (VITAMIN D3) 1000 UNITS CAPS Take 1,000 Units by mouth daily.        . Coenzyme Q10 (COQ10) 200 MG CAPS Take by mouth daily.        Marland Kitchen COUMADIN 5 MG tablet TAKE AS DIRECTED BY      DOCTOR.   90 each  1  . furosemide (LASIX) 20 MG tablet Take 20 mg by mouth as needed.        . Multiple Vitamin (MULTIVITAMIN) tablet Take 1 tablet by mouth daily.        . Omega-3 Fatty Acids (SALMON) 200 MG CAPS Take by mouth daily.        Marland Kitchen saccharomyces boulardii (FLORASTOR) 250 MG capsule Take 250 mg by mouth daily.        Marland Kitchen SYNTHROID 125 MCG tablet TAKE 1/2 TABLET ON       MONDAY, WEDNESDAY,FRIDAY,AND SUNDAY, AND 1 TAB ON TUES,THURS,SAT.  60 each  0  . calcium carbonate (OS-CAL) 600 MG TABS Take 600 mg by mouth. 2 x weekly       . vancomycin (VANCOCIN) 125 MG capsule Take 125 mg by mouth as directed.         Allergies  Allergen Reactions  . Keflex Hives  . Cephalexin     REACTION: Hives  . Colchicine     REACTION: violent diarrhea  . Erythromycin     REACTION: swollen and sore mouth  . Rosuvastatin     REACTION: upper arm pain bilaterally       Objective:   Physical Exam Well-developed elderly white female in no acute distress, alert and oriented x3, pleasant, HEENT; nontender MAC normocephalic EOMI PERRLA sclera anicteric,Neck; Supple no JVD, Cardiovascular; regular rate and rhythm with S1-S2,  defibrillator present in chest wall, pulmonary; clear bilaterally, Abdomen; soft nontender, nondistended no palpable mass or hepatosplenomegaly, bowel sounds active, Rectal; not done Skin; warm and dry benign without lesion, Psych; mood and affect normal and appropriate.        Assessment & Plan:  #22 77 year old female with 3 week history of progressive diarrhea in the setting of concurrent antibiotics for presumed recurrent diverticulitis. Patient with 2 previous episodes of C. difficile colitis, requiring vancomycin for resolution. At this time have side high suspicion for recurrent C. difficile colitis. Patient is stable and nontoxic appearing.  Plan; Stool specimen was submitted this morning for C. difficile PCR, will await results and if positive proceed with vancomycin 250 mg 4 times daily  x14 days. Continue florastor  by mouth twice daily Add Bentyl 10 mg 2 or 3 times daily as needed for cramping Soft low residue diet. Followup with Dr. [name] as needed  #2 Recurrent diverticulitis No current evidence for diverticulitis  #3 History of colon polyps Last colonoscopy 2011 negative  #4 Chronic anticoagulation with Coumadin for atrial fibrillation.  Reviewed and agree with management. Carie Caddy. Pyrtle, MD

## 2010-10-18 ENCOUNTER — Telehealth: Payer: Self-pay | Admitting: Physician Assistant

## 2010-10-18 LAB — CLOSTRIDIUM DIFFICILE BY PCR: Toxigenic C. Difficile by PCR: NOT DETECTED

## 2010-10-18 NOTE — Telephone Encounter (Signed)
Per Mike Gip, PA,  C.diff negative. Continue Florastor BID, bentyl TID prn, and Imodium prn. Patient notified.

## 2010-10-31 ENCOUNTER — Other Ambulatory Visit: Payer: Self-pay | Admitting: Internal Medicine

## 2010-10-31 ENCOUNTER — Encounter: Payer: Self-pay | Admitting: Internal Medicine

## 2010-10-31 ENCOUNTER — Ambulatory Visit (INDEPENDENT_AMBULATORY_CARE_PROVIDER_SITE_OTHER): Payer: Medicare Other | Admitting: *Deleted

## 2010-10-31 DIAGNOSIS — I428 Other cardiomyopathies: Secondary | ICD-10-CM

## 2010-10-31 DIAGNOSIS — I4891 Unspecified atrial fibrillation: Secondary | ICD-10-CM

## 2010-11-03 LAB — REMOTE ICD DEVICE
ATRIAL PACING ICD: 0.1 pct
FVT: 0
LV LEAD IMPEDENCE ICD: 1007 Ohm
PACEART VT: 0
TZAT-0001ATACH: 1
TZAT-0001ATACH: 2
TZAT-0001FASTVT: 1
TZAT-0001SLOWVT: 1
TZAT-0001SLOWVT: 2
TZAT-0002ATACH: NEGATIVE
TZAT-0002ATACH: NEGATIVE
TZAT-0004SLOWVT: 8
TZAT-0004SLOWVT: 8
TZAT-0012SLOWVT: 200 ms
TZAT-0012SLOWVT: 200 ms
TZAT-0013FASTVT: 2
TZAT-0013SLOWVT: 2
TZAT-0013SLOWVT: 2
TZAT-0018ATACH: NEGATIVE
TZAT-0018FASTVT: NEGATIVE
TZAT-0018SLOWVT: NEGATIVE
TZAT-0019ATACH: 6 V
TZAT-0019ATACH: 6 V
TZAT-0019ATACH: 6 V
TZAT-0019FASTVT: 8 V
TZAT-0019SLOWVT: 8 V
TZAT-0019SLOWVT: 8 V
TZAT-0020ATACH: 1.5 ms
TZAT-0020SLOWVT: 1.5 ms
TZAT-0020SLOWVT: 1.5 ms
TZON-0003ATACH: 350 ms
TZON-0003FASTVT: 210 ms
TZON-0003SLOWVT: 360 ms
TZON-0003VSLOWVT: 450 ms
TZON-0004SLOWVT: 40
TZON-0004VSLOWVT: 20
TZST-0001ATACH: 5
TZST-0001FASTVT: 2
TZST-0001FASTVT: 6
TZST-0001SLOWVT: 3
TZST-0001SLOWVT: 5
TZST-0003FASTVT: 35 J
TZST-0003FASTVT: 35 J
TZST-0003FASTVT: 35 J
TZST-0003SLOWVT: 25 J
TZST-0003SLOWVT: 35 J
VENTRICULAR PACING ICD: 99.98 pct
VF: 0

## 2010-11-13 ENCOUNTER — Encounter: Payer: Medicare Other | Admitting: *Deleted

## 2010-11-14 ENCOUNTER — Ambulatory Visit (INDEPENDENT_AMBULATORY_CARE_PROVIDER_SITE_OTHER): Payer: Medicare Other | Admitting: *Deleted

## 2010-11-14 ENCOUNTER — Encounter: Payer: Self-pay | Admitting: *Deleted

## 2010-11-14 DIAGNOSIS — I4891 Unspecified atrial fibrillation: Secondary | ICD-10-CM

## 2010-11-14 LAB — POCT INR: INR: 1.8

## 2010-11-15 NOTE — Progress Notes (Signed)
ICD/ICM checked by remote. 

## 2010-11-25 LAB — CBC
HCT: 43.9
Hemoglobin: 15
RBC: 4.83
RDW: 14.5
WBC: 7.2

## 2010-11-25 LAB — COMPREHENSIVE METABOLIC PANEL
ALT: 38 — ABNORMAL HIGH
BUN: 17
CO2: 27
Calcium: 9.2
Creatinine, Ser: 0.83
GFR calc non Af Amer: >60
Glucose, Bld: 96

## 2010-11-25 LAB — DIFFERENTIAL
Eosinophils Absolute: 0.1 — ABNORMAL LOW
Lymphocytes Relative: 35
Lymphs Abs: 2.5
Neutrophils Relative %: 55

## 2010-11-25 LAB — PROTIME-INR
INR: 2.2 — ABNORMAL HIGH
Prothrombin Time: 25.1 — ABNORMAL HIGH

## 2010-11-25 LAB — CALCIUM: Calcium: 8 — ABNORMAL LOW

## 2010-11-29 LAB — I-STAT 8, (EC8 V) (CONVERTED LAB)
BUN: 14
Chloride: 108
Glucose, Bld: 101 — ABNORMAL HIGH
Potassium: 4.2
Sodium: 142

## 2010-11-29 LAB — DIFFERENTIAL
Basophils Absolute: 0
Basophils Relative: 1
Eosinophils Absolute: 0.2
Eosinophils Relative: 3
Monocytes Absolute: 0.5
Neutro Abs: 2.7

## 2010-11-29 LAB — CBC
Hemoglobin: 14
MCHC: 35.2
RDW: 14.3 — ABNORMAL HIGH

## 2010-11-29 LAB — BASIC METABOLIC PANEL
BUN: 15
CO2: 25
Calcium: 8.8
Creatinine, Ser: 0.96
Glucose, Bld: 100 — ABNORMAL HIGH

## 2010-11-29 LAB — APTT: aPTT: 45 — ABNORMAL HIGH

## 2010-12-05 ENCOUNTER — Ambulatory Visit (INDEPENDENT_AMBULATORY_CARE_PROVIDER_SITE_OTHER): Payer: Medicare Other | Admitting: *Deleted

## 2010-12-05 DIAGNOSIS — I4891 Unspecified atrial fibrillation: Secondary | ICD-10-CM

## 2010-12-17 ENCOUNTER — Ambulatory Visit (INDEPENDENT_AMBULATORY_CARE_PROVIDER_SITE_OTHER): Payer: Medicare Other

## 2010-12-17 DIAGNOSIS — Z23 Encounter for immunization: Secondary | ICD-10-CM

## 2010-12-19 ENCOUNTER — Ambulatory Visit (INDEPENDENT_AMBULATORY_CARE_PROVIDER_SITE_OTHER): Payer: Medicare Other | Admitting: *Deleted

## 2010-12-19 DIAGNOSIS — I4891 Unspecified atrial fibrillation: Secondary | ICD-10-CM

## 2010-12-23 ENCOUNTER — Other Ambulatory Visit: Payer: Self-pay | Admitting: *Deleted

## 2010-12-23 MED ORDER — COUMADIN 5 MG PO TABS: 5.0000 mg | ORAL_TABLET | ORAL | Status: DC

## 2011-01-10 ENCOUNTER — Other Ambulatory Visit: Payer: Self-pay | Admitting: Internal Medicine

## 2011-01-10 NOTE — Telephone Encounter (Signed)
TSH 244.9 

## 2011-01-13 ENCOUNTER — Encounter: Payer: Medicare Other | Admitting: *Deleted

## 2011-01-13 ENCOUNTER — Other Ambulatory Visit: Payer: Self-pay | Admitting: Internal Medicine

## 2011-01-14 ENCOUNTER — Ambulatory Visit (INDEPENDENT_AMBULATORY_CARE_PROVIDER_SITE_OTHER): Payer: Medicare Other | Admitting: *Deleted

## 2011-01-14 ENCOUNTER — Encounter: Payer: Self-pay | Admitting: Family Medicine

## 2011-01-14 ENCOUNTER — Ambulatory Visit (INDEPENDENT_AMBULATORY_CARE_PROVIDER_SITE_OTHER): Payer: Medicare Other | Admitting: Family Medicine

## 2011-01-14 VITALS — BP 124/72 | HR 89 | Temp 98.0°F | Wt 168.2 lb

## 2011-01-14 DIAGNOSIS — I4891 Unspecified atrial fibrillation: Secondary | ICD-10-CM

## 2011-01-14 DIAGNOSIS — D171 Benign lipomatous neoplasm of skin and subcutaneous tissue of trunk: Secondary | ICD-10-CM

## 2011-01-14 DIAGNOSIS — D1779 Benign lipomatous neoplasm of other sites: Secondary | ICD-10-CM

## 2011-01-14 LAB — POCT INR: INR: 2.4

## 2011-01-14 NOTE — Progress Notes (Signed)
  Subjective:    Patient ID: Michele [name], female    DOB: 08/01/1929, 77 y.o.   MRN: [id number]  HPI Pt here c./o lipomas on her back that have been there for years but recently started to hurt her.     Review of Systems    as above Objective:   Physical Exam  Constitutional: She is oriented to person, place, and time. She appears well-developed and well-nourished.  Neurological: She is alert and oriented to person, place, and time.  Skin:       + lipomas x 3 on R side low back No signs infection No drainage          Assessment & Plan:  Lipomas--  Now causing pain---- referred to surgery for evaluation

## 2011-01-16 ENCOUNTER — Encounter: Payer: Self-pay | Admitting: Family Medicine

## 2011-01-16 NOTE — Telephone Encounter (Signed)
TSH 244.9 

## 2011-01-31 ENCOUNTER — Ambulatory Visit (INDEPENDENT_AMBULATORY_CARE_PROVIDER_SITE_OTHER): Payer: Self-pay | Admitting: Surgery

## 2011-02-03 ENCOUNTER — Encounter: Payer: Self-pay | Admitting: Internal Medicine

## 2011-02-03 ENCOUNTER — Ambulatory Visit (INDEPENDENT_AMBULATORY_CARE_PROVIDER_SITE_OTHER): Payer: Medicare Other | Admitting: Internal Medicine

## 2011-02-03 DIAGNOSIS — I428 Other cardiomyopathies: Secondary | ICD-10-CM

## 2011-02-03 DIAGNOSIS — I4891 Unspecified atrial fibrillation: Secondary | ICD-10-CM

## 2011-02-03 DIAGNOSIS — I429 Cardiomyopathy, unspecified: Secondary | ICD-10-CM

## 2011-02-03 DIAGNOSIS — Z95810 Presence of automatic (implantable) cardiac defibrillator: Secondary | ICD-10-CM

## 2011-02-03 DIAGNOSIS — I509 Heart failure, unspecified: Secondary | ICD-10-CM

## 2011-02-03 LAB — ICD DEVICE OBSERVATION
AL AMPLITUDE: 3.1 mv
HV IMPEDENCE: 49 Ohm
LV LEAD THRESHOLD: 0.75 V
RV LEAD THRESHOLD: 0.5 V
TZAT-0001ATACH: 1
TZAT-0001ATACH: 2
TZAT-0001SLOWVT: 2
TZAT-0002ATACH: NEGATIVE
TZAT-0004SLOWVT: 8
TZAT-0004SLOWVT: 8
TZAT-0005FASTVT: 88 pct
TZAT-0005SLOWVT: 88 pct
TZAT-0005SLOWVT: 91 pct
TZAT-0011FASTVT: 10 ms
TZAT-0012ATACH: 150 ms
TZAT-0012ATACH: 150 ms
TZAT-0012SLOWVT: 200 ms
TZAT-0012SLOWVT: 200 ms
TZAT-0013SLOWVT: 2
TZAT-0013SLOWVT: 2
TZAT-0018ATACH: NEGATIVE
TZAT-0018ATACH: NEGATIVE
TZAT-0018FASTVT: NEGATIVE
TZAT-0019ATACH: 6 V
TZON-0003ATACH: 350 ms
TZON-0003FASTVT: 210 ms
TZON-0003SLOWVT: 360 ms
TZON-0004SLOWVT: 40
TZON-0004VSLOWVT: 20
TZST-0001ATACH: 4
TZST-0001FASTVT: 3
TZST-0001FASTVT: 5
TZST-0001SLOWVT: 3
TZST-0001SLOWVT: 6
TZST-0002ATACH: NEGATIVE
TZST-0003FASTVT: 25 J
TZST-0003FASTVT: 35 J
TZST-0003SLOWVT: 25 J
TZST-0003SLOWVT: 35 J

## 2011-02-03 NOTE — Assessment & Plan Note (Signed)
Permanent  On anticoagulation  S/p av ablation

## 2011-02-03 NOTE — Assessment & Plan Note (Signed)
Continue current meds 

## 2011-02-03 NOTE — Progress Notes (Signed)
  HPI  Michele [name] is a 77 y.o. female is seen in followup for atrial fibrillation s/p AV ablation with A. 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  She comes in today feeling really very well The patient denies SOB, chest pain, edema or palpitations  She has a new cold from a cruise   Past Medical History  Diagnosis Date  . Cardiomyopathy     RATE RELATED  . FH: colonic polyps   . Diverticulosis     DIVERTICULITIS  . C. difficile colitis   . Atrial fibrillation     NEG STRESS CARDILITE  . Gilbert's syndrome   . Hyperlipidemia     NMR LIOPROFILE LDL 234(3206/2234)HDL 37,TG86  . Thyroid disease     HYPOTHYROIDISM...THYROID NODULES  . Diverticulitis     Past Surgical History  Procedure Date  . Biopsy thyroid     SINGLE NODULE  . Throidectomy 12/08   . Appendectomy 1956  . Cholecystectomy 1972  . Abdominal hysterectomy 1973  . Pacemaker placement     10/2006  . Icd....02/2008   . Breast biopsy 1997  . Rotator cuff repair     x2    Current Outpatient Prescriptions  Medication Sig Dispense Refill  . Biotin 1000 MCG tablet Take 1,000 mcg by mouth daily.        . Cholecalciferol (VITAMIN D3) 1000 UNITS CAPS Take 1,000 Units by mouth daily.        . Coenzyme Q10 (COQ10) 200 MG CAPS Take by mouth daily.        Marland Kitchen COUMADIN 5 MG tablet Take 1 tablet (5 mg total) by mouth as directed.  90 each  1  . furosemide (LASIX) 20 MG tablet TAKE ONE TABLET EVERY OTHER DAY.  45 tablet  1  . Multiple Vitamin (MULTIVITAMIN) tablet Take 1 tablet by mouth daily.        . Omega-3 Fatty Acids (SALMON) 200 MG CAPS Take by mouth daily.        Marland Kitchen saccharomyces boulardii (FLORASTOR) 250 MG capsule Take 250 mg by mouth daily.        Marland Kitchen SYNTHROID 125 MCG tablet TAKE 1/2 TABLET ON MONDAY, WEDNESDAY,FRIDAY, AND SUNDAY, AND 1 TAB ONTUES,THURS,SAT.  60 each  0    Allergies  Allergen Reactions  .  Keflex Hives  . Cephalexin     REACTION: Hives  . Colchicine     REACTION: violent diarrhea  . Erythromycin     REACTION: swollen and sore mouth  . Rosuvastatin     REACTION: upper arm pain bilaterally    Review of Systems negative except from HPI and PMH  Physical Exam Well developed and well nourished in no acute distress HENT normal E scleral and icterus clear Neck Supple JVP flat; carotids brisk and full Clear to ausculation Regular rate and rhythm, no murmurs gallops or rub Soft with active bowel sounds No clubbing cyanosis none Edema Alert and oriented, grossly normal motor and sensory function Skin Warm and Dry   Assessment and  Plan

## 2011-02-03 NOTE — Patient Instructions (Signed)
Your physician recommends that you continue on your current medications as directed. Please refer to the Current Medication list given to you today.  Your physician wants you to follow-up in: 1 year with Dr. Klein.  You will receive a reminder letter in the mail two months in advance. If you don't receive a letter, please call our office to schedule the follow-up appointment.  

## 2011-02-03 NOTE — Assessment & Plan Note (Signed)
The patient's device was interrogated.  The information was reviewed. No changes were made in the programming.    

## 2011-02-06 ENCOUNTER — Other Ambulatory Visit: Payer: Self-pay | Admitting: Gynecology

## 2011-02-14 ENCOUNTER — Ambulatory Visit (INDEPENDENT_AMBULATORY_CARE_PROVIDER_SITE_OTHER): Payer: Medicare Other | Admitting: *Deleted

## 2011-02-14 DIAGNOSIS — I4891 Unspecified atrial fibrillation: Secondary | ICD-10-CM

## 2011-02-20 ENCOUNTER — Emergency Department (HOSPITAL_COMMUNITY)
Admission: EM | Admit: 2011-02-20 | Discharge: 2011-02-20 | Disposition: A | Discharge status: Home / Self Care | Payer: Medicare Other | Attending: Emergency Medicine | Admitting: Emergency Medicine

## 2011-02-20 ENCOUNTER — Telehealth: Payer: Self-pay

## 2011-02-20 ENCOUNTER — Emergency Department (HOSPITAL_COMMUNITY): Payer: Medicare Other

## 2011-02-20 ENCOUNTER — Encounter (HOSPITAL_COMMUNITY): Payer: Self-pay

## 2011-02-20 DIAGNOSIS — R110 Nausea: Secondary | ICD-10-CM | POA: Insufficient documentation

## 2011-02-20 DIAGNOSIS — G44209 Tension-type headache, unspecified, not intractable: Secondary | ICD-10-CM

## 2011-02-20 DIAGNOSIS — E039 Hypothyroidism, unspecified: Secondary | ICD-10-CM | POA: Insufficient documentation

## 2011-02-20 DIAGNOSIS — E785 Hyperlipidemia, unspecified: Secondary | ICD-10-CM | POA: Insufficient documentation

## 2011-02-20 LAB — POCT I-STAT, CHEM 8
BUN: 17 mg/dL (ref 6–23)
Calcium, Ion: 1.17 mmol/L (ref 1.12–1.32)
Chloride: 105 mEq/L (ref 96–112)
Creatinine, Ser: 1 mg/dL (ref 0.50–1.10)
Glucose, Bld: 98 mg/dL (ref 70–99)
HCT: 45 % (ref 36.0–46.0)
Hemoglobin: 15.3 g/dL — ABNORMAL HIGH (ref 12.0–15.0)
Potassium: 4 mEq/L (ref 3.5–5.1)
Sodium: 143 mEq/L (ref 135–145)
TCO2: 27 mmol/L (ref 0–100)

## 2011-02-20 LAB — PROTIME-INR
INR: 1.83 — ABNORMAL HIGH (ref 0.00–1.49)
Prothrombin Time: 21.5 seconds — ABNORMAL HIGH (ref 11.6–15.2)

## 2011-02-20 NOTE — Telephone Encounter (Signed)
Left a message on home phone to return our call. I also left a message on cell explaining to pts spouse that I was sorry our schedule was booked and if wife is still having head pain that she should be seen at the emergency room or call the office tomorrow morning.

## 2011-02-20 NOTE — ED Provider Notes (Signed)
History     CSN: [id number]  Arrival date & time 02/20/11  1311   First MD Initiated Contact with Patient 02/20/11 1818      Chief Complaint  Patient presents with  . Headache    pain is located in the back of her head    (Consider location/radiation/quality/duration/timing/severity/associated sxs/prior treatment) HPI history from patient and husband Patient is a 78 year old female with history of A. fib (on Coumadin) who presents with headache. This started about 24 hours ago. It was abrupt in onset and has been constant. It was moderate to severe in severity. It is located over her left posterior skull. She states it initially radiated down towards her neck, however it has since stopped radiating. He now is just present in her left posterior scalp. She notes it being worse with laying on it or applying pressure to that area. It is not worse with loud noise or bright lights. She had very mild, brief nausea this morning but has had no vomiting and nausea since resolved. She denies recent head trauma. She has had no altered mental status or loss of consciousness. She denies vision changes or vision loss. She has had no weakness or sensation changes. She is still able to ambulate without assistance. She has never had this problem before. Patient also denies hearing changes, anterior neck pain, tinnitus.  Past Medical History  Diagnosis Date  . Cardiomyopathy     RATE RELATED  . FH: colonic polyps   . Diverticulosis     DIVERTICULITIS  . C. difficile colitis   . Campath-induced atrial fibrillation     NEG STRESS CARDILITE  . Gilbert's syndrome   . Hyperlipidemia     NMR LIOPROFILE LDL 234(3206/2234)HDL 37,TG86  . Thyroid disease     HYPOTHYROIDISM...THYROID NODULES  . Diverticulitis     Past Surgical History  Procedure Date  . Biopsy thyroid     SINGLE NODULE  . Throidectomy 12/08   . Appendectomy 1956  . Cholecystectomy 1972  . Abdominal hysterectomy 1973  . Pacemaker  placement     10/2006  . Icd....02/2008   . Breast biopsy 1997  . Rotator cuff repair     x2    History reviewed. No pertinent family history.  History  Substance Use Topics  . Smoking status: Never Smoker   . Smokeless tobacco: Not on file  . Alcohol Use: No    OB History    Grav Para Term Preterm Abortions TAB SAB Ect Mult Living                  Review of Systems  Constitutional: Negative for fever and chills.  HENT: Negative for facial swelling.   Eyes: Negative for visual disturbance.  Respiratory: Negative for cough, chest tightness, shortness of breath and wheezing.   Cardiovascular: Negative for chest pain.  Gastrointestinal: Negative for vomiting, abdominal pain and diarrhea.  Genitourinary: Negative for difficulty urinating.  Skin: Negative for rash.  Neurological: Positive for headaches. Negative for syncope, speech difficulty, weakness, light-headedness and numbness.  Psychiatric/Behavioral: Negative for behavioral problems and confusion.  All other systems reviewed and are negative.    Allergies  Keflex; Cephalexin; Colchicine; Erythromycin; and Rosuvastatin  Home Medications   Current Outpatient Rx  Name Route Sig Dispense Refill  . BIOTIN 1000 MCG PO TABS Oral Take 1,000 mcg by mouth daily.      Marland Kitchen VITAMIN D3 1000 UNITS PO CAPS Oral Take 1,000 Units by mouth daily.      Marland Kitchen  COQ10 200 MG PO CAPS Oral Take by mouth daily.      . FUROSEMIDE 20 MG PO TABS       . ONE-DAILY MULTI VITAMINS PO TABS Oral Take 1 tablet by mouth daily.      Marland Kitchen SALMON 200 MG PO CAPS Oral Take by mouth daily.      Marland Kitchen SACCHAROMYCES BOULARDII 250 MG PO CAPS Oral Take 250 mg by mouth daily.      Marland Kitchen SYNTHROID 125 MCG PO TABS  TAKE 1/2 TABLET ON MONDAY, WEDNESDAY,FRIDAY, AND SUNDAY, AND 1 TAB ONTUES,THURS,SAT. 60 each 0    Dispense as written.    **LABS DUE**  . WARFARIN SODIUM 5 MG PO TABS Oral Take 5 mg by mouth daily.        BP 131/86  Pulse 69  Temp(Src) 98 F (36.7 C) (Oral)   Resp 18  Ht 5\' 3"  (1.6 m)  Wt 162 lb (73.483 kg)  BMI 28.70 kg/m2  SpO2 99%  Physical Exam  Nursing note and vitals reviewed. Constitutional: She is oriented to person, place, and time. She appears well-developed and well-nourished. No distress.  HENT:  Head: Normocephalic.  Mouth/Throat: Oropharynx is clear and moist.       Mild to moderate tenderness to palpation over trapezius insertion on the left posterior scalp, just above occipital skull.  Eyes: EOM are normal. Pupils are equal, round, and reactive to light. No scleral icterus.  Neck: Normal range of motion. Neck supple.       No bruits Normal painless range of motion.  Cardiovascular: Normal rate, regular rhythm and intact distal pulses.   Pulmonary/Chest: Effort normal. No respiratory distress. She has no wheezes. She has no rales.  Abdominal: Soft. She exhibits no distension. There is no tenderness.  Musculoskeletal: Normal range of motion. She exhibits no edema and no tenderness.  Neurological: She is alert and oriented to person, place, and time. She has normal strength. No cranial nerve deficit or sensory deficit. She exhibits normal muscle tone. Coordination normal. GCS eye subscore is 4. GCS verbal subscore is 5. GCS motor subscore is 6.       No pronator drift Alert and oriented. Answers questions appropriately. Ambulates normally. Able to toe stand without difficulty. Negative Romberg. 5 out of 5 strength in all extremities.  Skin: Skin is warm and dry. No rash noted. She is not diaphoretic.  Psychiatric: She has a normal mood and affect. Her behavior is normal. Thought content normal.    ED Course  Procedures (including critical care time)  Labs Reviewed  PROTIME-INR - Abnormal; Notable for the following:    Prothrombin Time 21.5 (*)    INR 1.83 (*)    All other components within normal limits  POCT I-STAT, CHEM 8 - Abnormal; Notable for the following:    Hemoglobin 15.3 (*)    All other components within  normal limits   Ct Head Wo Contrast  02/20/2011  *RADIOLOGY REPORT*  Clinical Data: Left sided headache.  CT HEAD WITHOUT CONTRAST  Technique:  Contiguous axial images were obtained from the base of the skull through the vertex without contrast.  Comparison: 10/24/2006  Findings: Mild chronic small vessel disease throughout the deep white matter. No acute intracranial abnormality.  Specifically, no hemorrhage, hydrocephalus, mass lesion, acute infarction, or significant intracranial injury.  No acute calvarial abnormality. Visualized paranasal sinuses and mastoids clear.  Orbital soft tissues unremarkable.  IMPRESSION: Mild chronic small vessel disease. No acute intracranial abnormality.  Original Report  Authenticated By: Cyndie Chime, M.D.     1. Tension headache       MDM   Patient here with headache. It has been present since yesterday. Due to her age and anticoagulation with Coumadin, CT scan ordered. CT was negative. INR is just under 2. Very low clinical suspicion for acute intracranial hemorrhage as etiology of patient's headache. She has moderate to severe tenderness to palpation over trapezius insertion on left skull. She overall is well appearing. Based on low pretest probability and negative CT scan, tension headache most likely. We discussed lumbar puncture, however at this time risk outweighs benefit. Shorter-term percussions discussed. Patient already has outpatient followup in a couple days.         Milus Glazier 02/21/11 [phone]

## 2011-02-20 NOTE — ED Notes (Signed)
Head pain began yesterday , denies any vomiting, is nauseated it is located at the base of her skull,

## 2011-02-20 NOTE — Telephone Encounter (Signed)
pts spouse left a message stating that patients head is hurting? Pts spouse stated that pt did have a "little fall" on Christmas but hasn't complained of any pain? Pts husband stated he would like MD to fit patient into schedule today? Please advise?

## 2011-02-25 ENCOUNTER — Encounter: Payer: Self-pay | Admitting: Internal Medicine

## 2011-02-25 ENCOUNTER — Ambulatory Visit (INDEPENDENT_AMBULATORY_CARE_PROVIDER_SITE_OTHER): Payer: Medicare Other | Admitting: Internal Medicine

## 2011-02-25 VITALS — BP 120/82 | HR 77 | Temp 97.8°F | Resp 12 | Ht 63.25 in | Wt 168.0 lb

## 2011-02-25 DIAGNOSIS — Z23 Encounter for immunization: Secondary | ICD-10-CM

## 2011-02-25 DIAGNOSIS — E039 Hypothyroidism, unspecified: Secondary | ICD-10-CM

## 2011-02-25 DIAGNOSIS — Z0000 Encounter for general adult medical examination without abnormal findings: Secondary | ICD-10-CM

## 2011-02-25 DIAGNOSIS — E785 Hyperlipidemia, unspecified: Secondary | ICD-10-CM

## 2011-02-25 NOTE — Progress Notes (Signed)
Subjective:    Patient ID: Michele [name], female    DOB: March 01, 1929, 78 y.o.   MRN: [id number]  HPI Medicare Wellness Visit:  The following psychosocial & medical history were reviewed as required by Medicare.   Social history: caffeine: none , alcohol:  none ,  tobacco use : never  & exercise : walking on treadmill 3 miles 4 x / week & trainer 1X/ week.   Home & personal  safety / fall risk: no issues, activities of daily living: no limtations , seatbelt use : yes , and smoke alarm employment :yes  .  Power of Attorney/Living Will status : needed  Vision ( as recorded per Nurse) & Hearing  evaluation :  Ophth exam 3 mos ago; no issues.near & distant vision lenses worn . Whisper heard @ 6 ft Orientation :oriented X 3 , memory & recall : good, spelling  testing: good,and mood & affect :normal . Depression / anxiety: no issues Travel history :[place] 12/12, immunization status :Shingles needed , transfusion history:  no, and preventive health surveillance ( colonoscopies, BMD , etc as per protocol/ [facility]): colonoscopy up to date, Dental care:  Seen every 6 mos . Chart reviewed &  Updated. Active issues reviewed & addressed.       Review of Systems Dyslipidemia assessment: Prior Advanced Lipid Testing: NMR 2005.   Family history of premature CAD/ MI: Mother MI @ 22; bro MI @ 82 .  Nutrition: heart healthy diet .  Diabetes : no . HTN: no Med compliance: off due to Verapamil Rx Weight :  stable. ROS: fatigue: no ; chest pain : no ;claudication: no; palpitations: no; abd pain/bowel changes: no ; myalgias:no;  syncope : no ; memory loss: word & name recall occasionally delayed;skin changes: nails brittle.      Objective:   Physical Exam Gen.: Healthy and well-nourished in appearance. Alert, appropriate and cooperative throughout exam.Appears younger than stated age  Head: Normocephalic without obvious abnormalities  Eyes: No corneal or conjunctival inflammation noted.  Extraocular motion  intact.  Ears: External  ear exam reveals no significant lesions or deformities. Canals clear .TMs normal.  Nose: External nasal exam reveals no deformity or inflammation. Nasal mucosa are pink and moist. No lesions or exudates noted. Septum  Dislocated to L  Mouth: Oral mucosa and oropharynx reveal no lesions or exudates. Teeth in good repair. Neck: No deformities, masses, or tenderness noted. Range of motion & Thyroid normal Lungs: Normal respiratory effort; chest expands symmetrically. Lungs are clear to auscultation without rales, wheezes, or increased work of breathing. Heart: Normal rate and rhythm. Normal S1 and S2. No gallop, click, or rub. S 4 with slurring; no murmur. Abdomen: Bowel sounds normal; abdomen soft and nontender. No masses, organomegaly or hernias noted. Genitalia: Dr  Nicholas Lose   .                                                                                   Musculoskeletal/extremities: No deformity or scoliosis noted of  the thoracic or lumbar spine. No clubbing, cyanosis, edema, or deformity noted. Range of motion  normal .Tone & strength  normal.Joints ; minor DIP changes. Nail health  good. Vascular: Carotid, radial artery, dorsalis pedis and  posterior tibial pulses are full and equal. No bruits present. Neurologic: Alert and oriented x3. Deep tendon reflexes symmetrical and normal.         Skin: Intact without suspicious lesions or rashes. Lymph: No cervical, axillary  lymphadenopathy present. Psych: Mood and affect are normal. Normally interactive                                                                                         Assessment & Plan:  #1 Medicare Wellness Exam; criteria met ; data entered #2 Problem List reviewed ; Assessment/ Recommendations made Plan: see Orders

## 2011-02-25 NOTE — Assessment & Plan Note (Signed)
She has been off of statin because she was on verapamil for rate control. This has been stopped after insertion of a pacemaker. Her advanced cholesterol panel did reveal significant risk based on particle number & particles size. There is significant family history of  heart disease although it is not premature. Fasting lipids should be collected along with hepatic studies.

## 2011-02-25 NOTE — Assessment & Plan Note (Signed)
TSH should be checked; goal is a value of 1-3.

## 2011-02-25 NOTE — Patient Instructions (Signed)
Preventive Health Care: Exercise  30-45  minutes a day, 3-4 days a week. Walking is especially valuable in preventing Osteoporosis. Eat a low-fat diet with lots of fruits and vegetables, up to 7-9 servings per day. Consume less than 30 grams of sugar per day from foods & drinks with High Fructose Corn Syrup as # 1,2,3 or #4 on label. Health Care Power of Attorney & Living Will place you in charge of your health care  decisions. Verify these are  in place. Please  schedule fasting Labs : TSH,Lipids, hepatic panel. PLEASE BRING THESE INSTRUCTIONS TO FOLLOW UP  LAB APPOINTMENT.This will guarantee correct labs are drawn, eliminating need for repeat blood sampling ( needle sticks ! ). Diagnoses /Codes: 272.4, 244.9

## 2011-02-25 NOTE — Progress Notes (Signed)
  Subjective:    Patient ID: Michele [name], female    DOB: [birth year], 78 y.o.   MRN: [id number]  HPI    Review of Systems     Objective:   Physical Exam        Assessment & Plan:  EKG reveals atrial fib with well-controlled rate. Pacer spikes are noted. No ischemic changes are present.

## 2011-02-27 ENCOUNTER — Other Ambulatory Visit (INDEPENDENT_AMBULATORY_CARE_PROVIDER_SITE_OTHER): Payer: Medicare Other

## 2011-02-27 ENCOUNTER — Other Ambulatory Visit: Payer: Self-pay | Admitting: Internal Medicine

## 2011-02-27 DIAGNOSIS — E785 Hyperlipidemia, unspecified: Secondary | ICD-10-CM

## 2011-02-27 DIAGNOSIS — E039 Hypothyroidism, unspecified: Secondary | ICD-10-CM

## 2011-02-27 LAB — LIPID PANEL
Cholesterol: 281 mg/dL — ABNORMAL HIGH (ref 0–200)
HDL: 40.7 mg/dL (ref >39.00)
Total CHOL/HDL Ratio: 7
VLDL: 38.4 mg/dL (ref 0.0–40.0)

## 2011-02-27 LAB — TSH: TSH: 3.48 u[IU]/mL (ref 0.35–5.50)

## 2011-02-28 NOTE — ED Provider Notes (Signed)
I saw and evaluated the patient, reviewed the resident's note and I agree with the findings and plan.  81yF with HA. Atraumatic. Gradual onset. Posterior HA. Very reproducible on exam with point tenderness. nonfocal neuro exam. On coumadin for hx of afib so CT ordered although very low suspicion for bleed. No acute process on CT. No lp going low risk and elevated inr. outpt fu as previously arranged.  Raeford Razor, MD 02/28/11 [facility]

## 2011-03-06 ENCOUNTER — Telehealth: Payer: Self-pay

## 2011-03-06 NOTE — Telephone Encounter (Signed)
Patient called to say she has re-considered taking a cholesterol medication, patient will take whatever Dr.Hopper prescribes  Dr.Hopper please advise, med ok to be sent to [facility] in [place]

## 2011-03-06 NOTE — Telephone Encounter (Signed)
Pravastatin 20 mg at bedtime with repeat fasting labs in 10 weeks (lipids, hepatic panel, CK; 272.4, 995.20). This medicine would cost $10 for 90 pills at [facility] or  [facility], if insurance not used.   PLEASE BRING THESE INSTRUCTIONS TO FOLLOW UP  LAB APPOINTMENT.This will guarantee correct labs are drawn, eliminating need for repeat blood sampling ( needle sticks ! ).

## 2011-03-07 MED ORDER — PRAVASTATIN SODIUM 20 MG PO TABS: 20.0000 mg | ORAL_TABLET | Freq: Every day | ORAL | Status: DC

## 2011-03-07 NOTE — Telephone Encounter (Signed)
Discuss with patient, Rx sent. 

## 2011-03-13 ENCOUNTER — Encounter (INDEPENDENT_AMBULATORY_CARE_PROVIDER_SITE_OTHER): Payer: Medicare Other | Admitting: Surgery

## 2011-03-27 ENCOUNTER — Ambulatory Visit (INDEPENDENT_AMBULATORY_CARE_PROVIDER_SITE_OTHER): Payer: Medicare Other

## 2011-03-27 ENCOUNTER — Ambulatory Visit (INDEPENDENT_AMBULATORY_CARE_PROVIDER_SITE_OTHER): Payer: Medicare Other | Admitting: Internal Medicine

## 2011-03-27 ENCOUNTER — Encounter: Payer: Self-pay | Admitting: Internal Medicine

## 2011-03-27 VITALS — BP 120/78 | HR 88 | Temp 98.1°F | Ht 64.25 in | Wt 171.0 lb

## 2011-03-27 DIAGNOSIS — I4891 Unspecified atrial fibrillation: Secondary | ICD-10-CM

## 2011-03-27 DIAGNOSIS — E785 Hyperlipidemia, unspecified: Secondary | ICD-10-CM

## 2011-03-27 NOTE — Progress Notes (Signed)
  Subjective:    Patient ID: Michele [name], female    DOB: [birth year], 78 y.o.   MRN: [id number]  HPI ABDOMINAL PAIN Onset: 2-3 days after new medication, pravastatin; started pravastatin 3 weeks ago Location: diffuse abd pain upper and lower midline  Radiation: no Severity: 8/10 Quality: dull, aching  Duration: takes medication at night before bed, pain occurs a few hours into the night and lasts until the am  Better with: has stopped taking medication for 3 consecutive nights, no abd pain since  Worse with: when taking medication Takes probiotic BID for about 15-18 months Symptoms Nausea/Vomiting: no Diarrhea: started 2-3 days after new medication; diarrhea has stopped since Monday Constipation:no Melena/BRBPR:no Hematemesis: no Anorexia: no Fever/Chills: no fever.Chills occurred Tuesday Dysuria/ hematuria/pyuria:no Rash: no Wt loss: no EtOH use: no NSAIDs/ASA: no  Past Surgeries: hysterectomy 1973, c-section 1963, gallbladder surgery 1972.  There is no family history of gastrointestinal disease.  She has a past medical history diverticulosis as well as diverticulitis on 2 occasions. She also had C. difficile colitis.   Review of Systems  Pt states she is not having any other symptoms besides the abdominal pain.      Objective:   Physical Exam  General appearance is one of good health and nourishment w/o distress.  Eyes: No conjunctival inflammation or scleral icterus is present.  Neck: Supple with good range of motion. Osteophyte present in the left upper anterior neck. Thyroid is surgically absent  Oral exam: Dental hygiene is good; lips and gums are healthy appearing.There is no oropharyngeal erythema or exudate noted.   Heart:  Normal rate and regular rhythm. Grade 1 systolic murmur without gallop, click, rub or other extra sounds . Has ICD in place (set between 72-120).  Lungs:Chest clear to auscultation; no wheezes, rhonchi,rales ,or rubs present.No  increased work of breathing.   Abdomen: bowel sounds normal, soft and non-tender without masses, organomegaly or hernias noted.  No guarding or rebound   Skin:Warm & dry.  Intact without suspicious lesions or rashes ; no jaundice. Mild tenting.  Lymphatic: No lymphadenopathy is noted about the head, neck, axilla               Assessment & Plan:  #1 Abdominal pain; she temporally relates this to ingestion of statin. Symptoms resolve off the medication.

## 2011-03-27 NOTE — Patient Instructions (Signed)
Risk of premature heart attack or stroke increases as LDL or BAD cholesterol rises.Advanced cholesterol panels optimally determine risk based on particle composition ( NMR Lipoprofile ) or by assessing multiple other genetic risks([facility] Heart Panel or [facility] Lipid Panel). These are indicated when LDL is > 130, especially if there is family history of heart attack in males before 86 or women before 20. Based on your prior advanced testing, your LDL goal is < 100 , ideally < 70. Your present LDL increases long term heart attack or stroke risk > 130 % %.The best dietary  information on cholesterol is Dr Gildardo Griffes book Eat, Drink & Be Healthy.  PLEASE BRING THESE INSTRUCTIONS TO FOLLOW UP  LAB APPOINTMENT for [facility] Heart Panel in 10 weeks .This will guarantee correct labs are drawn, eliminating need for repeat blood sampling ( needle sticks ! ). Diagnoses /Codes: 272.4, V17.3

## 2011-03-27 NOTE — Assessment & Plan Note (Signed)
She has a history of intolerance to Crestor and now pravastatin. Pravastatin should not cause abdominal pain particularly 2 this dose. The minimal risk of hepatic issues or myalgia issues were also discussed. Her advanced cholesterol panel suggest dramatically increased risk. I will recommend the [facility] heart panel to optimally assess risk and options.

## 2011-03-28 ENCOUNTER — Encounter: Payer: Medicare Other | Admitting: *Deleted

## 2011-04-18 ENCOUNTER — Other Ambulatory Visit: Payer: Self-pay | Admitting: Internal Medicine

## 2011-04-18 NOTE — Telephone Encounter (Signed)
Prescription sent to pharmacy.

## 2011-04-30 ENCOUNTER — Telehealth: Payer: Self-pay | Admitting: Internal Medicine

## 2011-04-30 NOTE — Telephone Encounter (Signed)
Her thyroid has been removed completely so this cannot be a thyroid nodule. She should come in to be assessed for possible cervical lymphadenopathy and need for possible antibiotics.

## 2011-04-30 NOTE — Telephone Encounter (Signed)
Patient states Dr. Modesto Charon at the [facility] office needs Dr. [name] to send a referral for the patient to be seen for the nodule in her neck.

## 2011-04-30 NOTE — Telephone Encounter (Signed)
Discuss with patient, appt scheduled. 

## 2011-05-02 ENCOUNTER — Ambulatory Visit (INDEPENDENT_AMBULATORY_CARE_PROVIDER_SITE_OTHER): Payer: Medicare Other | Admitting: Internal Medicine

## 2011-05-02 ENCOUNTER — Ambulatory Visit: Payer: Medicare Other | Admitting: Internal Medicine

## 2011-05-02 ENCOUNTER — Encounter: Payer: Self-pay | Admitting: Internal Medicine

## 2011-05-02 VITALS — BP 120/78 | HR 100 | Temp 97.8°F | Wt 169.0 lb

## 2011-05-02 DIAGNOSIS — M47812 Spondylosis without myelopathy or radiculopathy, cervical region: Secondary | ICD-10-CM

## 2011-05-02 NOTE — Progress Notes (Signed)
  Subjective:    Patient ID: Michele [name], female    DOB: Jan 01, 1930, 78 y.o.   MRN: [id number]  HPI She is concerned about the significance of a "node" in the left posterior neck. It has been present for 2 years but has been associated with intermittent aching in the past month.  In January of this year she was seen in emergency room for a left occipital headache; CAT scan was done which revealed only small vessel changes. Chemistries and hemoglobin were normal. A diagnosis of tension or musculoskeletal headache was made The headache has not recurred  Past medical history is significant for thyroidectomy for a benign nodule.      Review of Systems She denies fever, chills, sweats, or unexplained weight loss. She has no other lymphadenopathy. She bruises related to taking warfarin.     Objective:   Physical Exam She is healthy and well-nourished in appearance; she appears younger stated age  Extraocular motion is intact; field of vision is normal  Neck reveals operative changes in the  anterior neck; thyroid surgically absent. Neck is supple.  She has no lymphadenopathy about the neck or axilla.  The area of concern corresponds to a cervical osteophyte or spur.  She has no organomegaly or masses in the abdomen. Subcutaneous scar tissue is present in the right upper quadrant        Assessment & Plan:  #1 spondylolisthesis or  osteophyte; no evidence of cervical lymphadenopathy  Plan: Tramadol if symptoms fail to respond to warm moist compresses or topical anti-inflammatory creams as needed

## 2011-05-02 NOTE — Patient Instructions (Signed)
Use an anti-inflammatory cream such as Aspercreme or Zostrix cream twice a day to the neck as needed. In lieu of this warm moist compresses or  hot water bottle can be used. Do not apply ice. 

## 2011-05-08 ENCOUNTER — Ambulatory Visit (INDEPENDENT_AMBULATORY_CARE_PROVIDER_SITE_OTHER): Payer: Medicare Other | Admitting: *Deleted

## 2011-05-08 ENCOUNTER — Encounter: Payer: Self-pay | Admitting: Internal Medicine

## 2011-05-08 DIAGNOSIS — I4891 Unspecified atrial fibrillation: Secondary | ICD-10-CM

## 2011-05-08 DIAGNOSIS — I428 Other cardiomyopathies: Secondary | ICD-10-CM

## 2011-05-08 DIAGNOSIS — I509 Heart failure, unspecified: Secondary | ICD-10-CM

## 2011-05-12 LAB — REMOTE ICD DEVICE
BATTERY VOLTAGE: 2.9599 V
BRDY-0002LV: 70 {beats}/min
BRDY-0004LV: 120 {beats}/min
FVT: 0
LV LEAD THRESHOLD: 1.875 V
PACEART VT: 0
TOT-0006: 20100121000000
TZAT-0001ATACH: 1
TZAT-0001ATACH: 2
TZAT-0001FASTVT: 1
TZAT-0001SLOWVT: 1
TZAT-0002ATACH: NEGATIVE
TZAT-0002ATACH: NEGATIVE
TZAT-0004FASTVT: 8
TZAT-0011SLOWVT: 10 ms
TZAT-0012ATACH: 150 ms
TZAT-0012SLOWVT: 200 ms
TZAT-0013FASTVT: 2
TZAT-0013SLOWVT: 2
TZAT-0018ATACH: NEGATIVE
TZAT-0018ATACH: NEGATIVE
TZAT-0018SLOWVT: NEGATIVE
TZAT-0019ATACH: 6 V
TZAT-0019SLOWVT: 8 V
TZAT-0020ATACH: 1.5 ms
TZAT-0020FASTVT: 1.5 ms
TZAT-0020SLOWVT: 1.5 ms
TZON-0003ATACH: 350 ms
TZON-0004VSLOWVT: 20
TZST-0001ATACH: 4
TZST-0001FASTVT: 2
TZST-0001FASTVT: 4
TZST-0001SLOWVT: 3
TZST-0001SLOWVT: 4
TZST-0001SLOWVT: 6
TZST-0002ATACH: NEGATIVE
TZST-0003FASTVT: 35 J
TZST-0003FASTVT: 35 J
TZST-0003SLOWVT: 25 J
TZST-0003SLOWVT: 35 J
VENTRICULAR PACING ICD: 99.97 pct
VF: 0

## 2011-05-20 NOTE — Progress Notes (Signed)
ICD remote with ICM 

## 2011-05-23 ENCOUNTER — Encounter: Payer: Self-pay | Admitting: *Deleted

## 2011-06-23 ENCOUNTER — Ambulatory Visit (INDEPENDENT_AMBULATORY_CARE_PROVIDER_SITE_OTHER): Payer: Medicare Other | Admitting: *Deleted

## 2011-06-23 ENCOUNTER — Other Ambulatory Visit (INDEPENDENT_AMBULATORY_CARE_PROVIDER_SITE_OTHER): Payer: Medicare Other

## 2011-06-23 ENCOUNTER — Encounter: Payer: Self-pay | Admitting: Internal Medicine

## 2011-06-23 ENCOUNTER — Ambulatory Visit (INDEPENDENT_AMBULATORY_CARE_PROVIDER_SITE_OTHER): Payer: Medicare Other | Admitting: Pharmacist

## 2011-06-23 DIAGNOSIS — I509 Heart failure, unspecified: Secondary | ICD-10-CM

## 2011-06-23 DIAGNOSIS — T887XXA Unspecified adverse effect of drug or medicament, initial encounter: Secondary | ICD-10-CM

## 2011-06-23 DIAGNOSIS — E785 Hyperlipidemia, unspecified: Secondary | ICD-10-CM

## 2011-06-23 DIAGNOSIS — I4891 Unspecified atrial fibrillation: Secondary | ICD-10-CM

## 2011-06-23 DIAGNOSIS — I429 Cardiomyopathy, unspecified: Secondary | ICD-10-CM

## 2011-06-23 LAB — ICD DEVICE OBSERVATION
AL IMPEDENCE ICD: 551 Ohm
ATRIAL PACING ICD: 0.06 pct
CHARGE TIME: 9.609 s
LV LEAD IMPEDENCE ICD: 1064 Ohm
TOT-0001: 1
TOT-0002: 0
TOT-0006: 20100121000000
TZAT-0001ATACH: 3
TZAT-0001FASTVT: 1
TZAT-0002ATACH: NEGATIVE
TZAT-0004FASTVT: 8
TZAT-0012ATACH: 150 ms
TZAT-0012ATACH: 150 ms
TZAT-0012SLOWVT: 200 ms
TZAT-0013FASTVT: 2
TZAT-0018ATACH: NEGATIVE
TZAT-0019ATACH: 6 V
TZAT-0019ATACH: 6 V
TZAT-0019SLOWVT: 8 V
TZAT-0019SLOWVT: 8 V
TZAT-0020ATACH: 1.5 ms
TZAT-0020ATACH: 1.5 ms
TZAT-0020FASTVT: 1.5 ms
TZAT-0020SLOWVT: 1.5 ms
TZAT-0020SLOWVT: 1.5 ms
TZON-0003VSLOWVT: 450 ms
TZST-0001ATACH: 5
TZST-0001ATACH: 6
TZST-0001FASTVT: 2
TZST-0001FASTVT: 4
TZST-0001SLOWVT: 5
TZST-0002ATACH: NEGATIVE
TZST-0003FASTVT: 35 J
TZST-0003FASTVT: 35 J
TZST-0003SLOWVT: 15 J
TZST-0003SLOWVT: 35 J
VENTRICULAR PACING ICD: 99.94 pct
VF: 0

## 2011-06-23 LAB — HEPATIC FUNCTION PANEL
ALT: 16 U/L (ref 0–35)
AST: 18 U/L (ref 0–37)
Albumin: 3.8 g/dL (ref 3.5–5.2)
Alkaline Phosphatase: 52 U/L (ref 39–117)

## 2011-06-23 LAB — POCT INR: INR: 2.2

## 2011-06-23 LAB — LIPID PANEL
Cholesterol: 213 mg/dL — ABNORMAL HIGH (ref 0–200)
Triglycerides: 134 mg/dL (ref 0.0–149.0)

## 2011-06-23 LAB — LDL CHOLESTEROL, DIRECT: Direct LDL: 163.7 mg/dL

## 2011-06-23 NOTE — Progress Notes (Signed)
icd check in clinic  

## 2011-06-23 NOTE — Progress Notes (Signed)
Labs only

## 2011-06-24 ENCOUNTER — Other Ambulatory Visit: Payer: Self-pay | Admitting: Internal Medicine

## 2011-06-25 ENCOUNTER — Encounter: Payer: Self-pay | Admitting: Internal Medicine

## 2011-07-11 ENCOUNTER — Encounter (HOSPITAL_COMMUNITY): Payer: Self-pay | Admitting: Physical Medicine and Rehabilitation

## 2011-07-11 ENCOUNTER — Emergency Department (HOSPITAL_COMMUNITY)
Admission: EM | Admit: 2011-07-11 | Discharge: 2011-07-11 | Disposition: A | Discharge status: Home / Self Care | Payer: Medicare Other | Attending: Emergency Medicine | Admitting: Emergency Medicine

## 2011-07-11 DIAGNOSIS — R110 Nausea: Secondary | ICD-10-CM | POA: Insufficient documentation

## 2011-07-11 DIAGNOSIS — R109 Unspecified abdominal pain: Secondary | ICD-10-CM | POA: Insufficient documentation

## 2011-07-11 NOTE — ED Notes (Signed)
Called patient for the 3rd time w/o answer in main triage waiting and in triage waiting

## 2011-07-11 NOTE — ED Notes (Signed)
Called patient x 2 without an answer

## 2011-07-11 NOTE — ED Notes (Addendum)
Pt presents to department for evaluation of diffuse abdominal pain and nausea. Onset today. Was seen at [facility] and sent here. Denies pain upon arrival to ED. Denies urinary symptoms. She is alert and oriented x4. BM today was normal, no blood noticed.

## 2011-07-16 ENCOUNTER — Telehealth: Payer: Self-pay | Admitting: *Deleted

## 2011-07-16 ENCOUNTER — Ambulatory Visit (INDEPENDENT_AMBULATORY_CARE_PROVIDER_SITE_OTHER): Payer: Medicare Other | Admitting: Internal Medicine

## 2011-07-16 ENCOUNTER — Encounter: Payer: Self-pay | Admitting: Internal Medicine

## 2011-07-16 VITALS — BP 116/78 | HR 91 | Temp 97.6°F | Wt 164.0 lb

## 2011-07-16 DIAGNOSIS — R110 Nausea: Secondary | ICD-10-CM

## 2011-07-16 DIAGNOSIS — R1013 Epigastric pain: Secondary | ICD-10-CM

## 2011-07-16 MED ORDER — RANITIDINE HCL 150 MG PO TABS: 150.0000 mg | ORAL_TABLET | Freq: Two times a day (BID) | ORAL | Status: DC

## 2011-07-16 NOTE — Telephone Encounter (Signed)
Pt seen in office today.

## 2011-07-16 NOTE — Telephone Encounter (Signed)
[facility] Triage Call Report Triage Record Num: [id number] Operator: Tana Felts Patient Name: Katlin Bortner Call Date & Time: 07/11/2011 5:54:18PM Patient Phone: [phone] PCP: Marga Melnick Patient Gender: Female PCP Fax : [phone] Patient DOB: 09/08/1929 Practice Name: [facility] Reason for Call: Caller: Tiasia/Patient; PCP: Marga Melnick; CB#: [phone]; Onset 07/11/11 at 1500 Call regarding sharp, squeezing pains under ribes approx every 5 min x 3 hours, Pain is located near center and has radiated to the right side; Nausea x 2 days; Lightheaded x 2 days; Triaged Chest Pain, Advised to call 911 due to symptoms. Protocol(s) Used: Chest Pain Recommended Outcome per Protocol: Activate EMS 911 Reason for Outcome: Pressure, fullness, squeezing sensation or pain anywhere in the chest lasting 5 or more minutes now or within the last hour. Pain is NOT associated with taking a deep breath or a productive cough, movement, or touch to a localized area on the chest. Care Advice: ~ IMMEDIATE ACTION Write down provider's name. List or place the following in a bag for transport with the patient: current prescription and/or nonprescription medications; alternative treatments, therapies and medications; and street drugs.

## 2011-07-16 NOTE — Patient Instructions (Signed)
The triggers for reflux   include stress; the "aspirin family" ; alcohol; peppermint; and caffeine (coffee, tea, cola, and chocolate). The aspirin family would include aspirin and the nonsteroidal agents such as ibuprofen &  Naproxen. Tylenol would not cause reflux. If having symptoms ; food & drink should be avoided for @ least 2 hours before going to bed.  Please complete stool cards  Please try to go on My Chart within the next 24 hours to allow me to release the results directly to you.

## 2011-07-16 NOTE — Progress Notes (Signed)
  Subjective:    Patient ID: Michele [name], female    DOB: 02-10-1930, 78 y.o.   MRN: [id number]  HPI She went to the emergency room 07/11/11 with abdominal pain, nausea, and lightheadedness. There was no physical examination or lab assessment performed. After waiting several hours she returned home.  The pain was located in the epigastrium and lasted almost 5 hours. In the latter phases of the pain it had migrated laterally to the area above the right hip.  The pain was described as sharp and cramping and recurring every 5 minutes.  The pain resolved without definitive intervention; nausea has persisted.She has remained NPO except for some yogurt Past medical history/family history/social history were all reviewed and updated. Pertinent data: negative colonoscopy in 2012. Remotely she had a colon polyp& diverticulitis.Her son had Ulcerative Colitis    Review of Systems  Nausea/Vomiting: nausea with all food intake  Diarrhea: no Constipation: minor Melena/BRBPR: no  Hematemesis: no  Anorexia: no  Fever/Chills:some chills Dysuria/ hematuria/pyuria: no Rash: no Wt loss: 2-3 # since 5/24  EtOH use:no NSAIDs/ASA: no, on Warfarin         Objective:   Physical Exam General appearance is one of good health and nourishment w/o distress.  Eyes: No conjunctival inflammation or scleral icterus is present.  Oral exam: Dental hygiene is good; lips and gums are healthy appearing.There is no oropharyngeal erythema or exudate noted.   Heart:  Normal rate and regular rhythm. S1 and S2 normal without gallop, murmur, click, rub S4 with slurring at  LSB      Lungs:Chest clear to auscultation; no wheezes, rhonchi,rales ,or rubs present.No increased work of breathing.   Abdomen: bowel sounds normal, soft and non-tender without masses, organomegaly or hernias noted.  No guarding or rebound   Skin:Warm & dry.  Intact without suspicious lesions or rashes ; no jaundice ; slight   tenting  Lymphatic: No lymphadenopathy is noted about the head, neck, axilla areas.              Assessment & Plan:  #1 abdominal pain , resolved #2 nausea, persistent Plan: See orders and recommendations

## 2011-07-23 ENCOUNTER — Telehealth: Payer: Self-pay | Admitting: Internal Medicine

## 2011-07-23 NOTE — Telephone Encounter (Signed)
Message copied by Marshell Garfinkel on Wed Jul 23, 2011  3:23 PM ------      Message from: Pecola Lawless      Created: Mon Jul 21, 2011  6:04 PM       Please schedule  amylase , lipase, CBC as recommended       ----- Message -----         From: SYSTEM         Sent: 07/21/2011  12:02 AM           To: Pecola Lawless, MD

## 2011-07-23 NOTE — Telephone Encounter (Signed)
Lmovm for pt to return call.       BMET, hep panel also ----- Message ----- From: SYSTEM Sent: 07/21/2011 12:02 AM To: Pecola Lawless, MD

## 2011-07-24 ENCOUNTER — Other Ambulatory Visit: Payer: Self-pay | Admitting: Internal Medicine

## 2011-07-24 DIAGNOSIS — R110 Nausea: Secondary | ICD-10-CM

## 2011-07-24 DIAGNOSIS — R1013 Epigastric pain: Secondary | ICD-10-CM

## 2011-07-24 NOTE — Telephone Encounter (Signed)
Pt is scheduled for labs tomorrow, 07/25/11.

## 2011-07-25 ENCOUNTER — Other Ambulatory Visit (INDEPENDENT_AMBULATORY_CARE_PROVIDER_SITE_OTHER): Payer: Medicare Other

## 2011-07-25 DIAGNOSIS — R1013 Epigastric pain: Secondary | ICD-10-CM

## 2011-07-25 DIAGNOSIS — R110 Nausea: Secondary | ICD-10-CM

## 2011-07-25 LAB — LIPASE: Lipase: 51 U/L (ref 11.0–59.0)

## 2011-07-25 LAB — CBC WITH DIFFERENTIAL/PLATELET
Basophils Relative: 0.4 % (ref 0.0–3.0)
Eosinophils Relative: 1.9 % (ref 0.0–5.0)
HCT: 43.4 % (ref 36.0–46.0)
Hemoglobin: 14.3 g/dL (ref 12.0–15.0)
Lymphs Abs: 2 10*3/uL (ref 0.7–4.0)
Monocytes Relative: 7.7 % (ref 3.0–12.0)
Neutro Abs: 2.6 10*3/uL (ref 1.4–7.7)
RBC: 4.61 Mil/uL (ref 3.87–5.11)
WBC: 5.1 10*3/uL (ref 4.5–10.5)

## 2011-07-25 LAB — BASIC METABOLIC PANEL
BUN: 17 mg/dL (ref 6–23)
CO2: 30 mEq/L (ref 19–32)
Calcium: 8.7 mg/dL (ref 8.4–10.5)
Creatinine, Ser: 1 mg/dL (ref 0.4–1.2)
Glucose, Bld: 67 mg/dL — ABNORMAL LOW (ref 70–99)
Sodium: 142 mEq/L (ref 135–145)

## 2011-07-25 LAB — HEPATIC FUNCTION PANEL
Albumin: 3.7 g/dL (ref 3.5–5.2)
Alkaline Phosphatase: 54 U/L (ref 39–117)
Total Protein: 6.8 g/dL (ref 6.0–8.3)

## 2011-07-28 ENCOUNTER — Ambulatory Visit: Payer: Medicare Other | Admitting: Internal Medicine

## 2011-08-01 ENCOUNTER — Ambulatory Visit (INDEPENDENT_AMBULATORY_CARE_PROVIDER_SITE_OTHER): Payer: Medicare Other | Admitting: *Deleted

## 2011-08-01 DIAGNOSIS — I4891 Unspecified atrial fibrillation: Secondary | ICD-10-CM

## 2011-08-07 ENCOUNTER — Encounter: Payer: Self-pay | Admitting: Internal Medicine

## 2011-08-08 ENCOUNTER — Other Ambulatory Visit: Payer: Self-pay | Admitting: Internal Medicine

## 2011-08-08 DIAGNOSIS — E785 Hyperlipidemia, unspecified: Secondary | ICD-10-CM

## 2011-08-08 MED ORDER — EZETIMIBE-SIMVASTATIN 10-20 MG PO TABS: 1.0000 | ORAL_TABLET | Freq: Every day | ORAL | Status: DC

## 2011-08-08 NOTE — Telephone Encounter (Signed)
Dr.Hopper please advise on dose of Vytorin to restart patient on

## 2011-08-19 ENCOUNTER — Telehealth: Payer: Self-pay | Admitting: Internal Medicine

## 2011-08-19 NOTE — Telephone Encounter (Signed)
Appt scheduled for tomorrow to evaluate diaphragmatic stim.

## 2011-08-19 NOTE — Telephone Encounter (Signed)
Per spouse call pt's lead is going off like a beeping sound and she can feel it in her stomach. Called triage to talk to Mr. Sabree since device staff was not in yet, and was told to take a Urgent message and send to device clinic they will be in soon

## 2011-08-19 NOTE — Telephone Encounter (Signed)
F/u   Patient husband called again for status update.

## 2011-08-20 ENCOUNTER — Ambulatory Visit (INDEPENDENT_AMBULATORY_CARE_PROVIDER_SITE_OTHER): Payer: Medicare Other | Admitting: Cardiology

## 2011-08-20 ENCOUNTER — Ambulatory Visit (INDEPENDENT_AMBULATORY_CARE_PROVIDER_SITE_OTHER): Payer: Medicare Other | Admitting: Pharmacist

## 2011-08-20 ENCOUNTER — Encounter: Payer: Self-pay | Admitting: Cardiology

## 2011-08-20 ENCOUNTER — Inpatient Hospital Stay (HOSPITAL_COMMUNITY)
Admission: AD | Admit: 2011-08-20 | Discharge: 2011-08-23 | DRG: 227 | Disposition: A | Discharge status: Home / Self Care | Payer: Medicare Other | Source: Ambulatory Visit | Attending: Internal Medicine | Admitting: Internal Medicine

## 2011-08-20 VITALS — BP 144/86 | HR 89 | Ht 64.0 in | Wt 162.0 lb

## 2011-08-20 DIAGNOSIS — T82110A Breakdown (mechanical) of cardiac electrode, initial encounter: Secondary | ICD-10-CM | POA: Diagnosis present

## 2011-08-20 DIAGNOSIS — Z95810 Presence of automatic (implantable) cardiac defibrillator: Secondary | ICD-10-CM | POA: Diagnosis present

## 2011-08-20 DIAGNOSIS — I429 Cardiomyopathy, unspecified: Secondary | ICD-10-CM

## 2011-08-20 DIAGNOSIS — I428 Other cardiomyopathies: Secondary | ICD-10-CM | POA: Diagnosis present

## 2011-08-20 DIAGNOSIS — I4891 Unspecified atrial fibrillation: Secondary | ICD-10-CM | POA: Diagnosis present

## 2011-08-20 DIAGNOSIS — T82190A Other mechanical complication of cardiac electrode, initial encounter: Principal | ICD-10-CM | POA: Diagnosis present

## 2011-08-20 DIAGNOSIS — Y849 Medical procedure, unspecified as the cause of abnormal reaction of the patient, or of later complication, without mention of misadventure at the time of the procedure: Secondary | ICD-10-CM | POA: Diagnosis present

## 2011-08-20 DIAGNOSIS — T82198A Other mechanical complication of other cardiac electronic device, initial encounter: Secondary | ICD-10-CM

## 2011-08-20 DIAGNOSIS — E039 Hypothyroidism, unspecified: Secondary | ICD-10-CM | POA: Diagnosis present

## 2011-08-20 DIAGNOSIS — E785 Hyperlipidemia, unspecified: Secondary | ICD-10-CM | POA: Diagnosis present

## 2011-08-20 LAB — MRSA PCR SCREENING: MRSA by PCR: NEGATIVE

## 2011-08-20 LAB — COMPREHENSIVE METABOLIC PANEL
Albumin: 3.6 g/dL (ref 3.5–5.2)
BUN: 15 mg/dL (ref 6–23)
Calcium: 9.6 mg/dL (ref 8.4–10.5)
Chloride: 104 mEq/L (ref 96–112)
Creatinine, Ser: 0.96 mg/dL (ref 0.50–1.10)
Total Bilirubin: 1.2 mg/dL (ref 0.3–1.2)
Total Protein: 6.9 g/dL (ref 6.0–8.3)

## 2011-08-20 LAB — DIFFERENTIAL
Basophils Relative: 0 % (ref 0–1)
Eosinophils Absolute: 0.1 10*3/uL (ref 0.0–0.7)
Eosinophils Relative: 1 % (ref 0–5)
Monocytes Absolute: 0.5 10*3/uL (ref 0.1–1.0)
Monocytes Relative: 7 % (ref 3–12)

## 2011-08-20 LAB — CBC
HCT: 40.3 % (ref 36.0–46.0)
Hemoglobin: 14.3 g/dL (ref 12.0–15.0)
MCH: 31.4 pg (ref 26.0–34.0)
MCHC: 35.5 g/dL (ref 30.0–36.0)
RDW: 13.6 % (ref 11.5–15.5)

## 2011-08-20 MED ORDER — ONDANSETRON HCL 4 MG/2ML IJ SOLN: 4.0000 mg | Freq: Four times a day (QID) | INTRAMUSCULAR | Status: DC | PRN

## 2011-08-20 MED ORDER — CHLORHEXIDINE GLUCONATE 4 % EX LIQD
60.0000 mL | Freq: Once | CUTANEOUS | Status: AC
  Administered 2011-08-20: 4 via TOPICAL
  Filled 2011-08-20: qty 60

## 2011-08-20 MED ORDER — ACETAMINOPHEN 325 MG PO TABS: 650.0000 mg | ORAL_TABLET | ORAL | Status: DC | PRN

## 2011-08-20 MED ORDER — SODIUM CHLORIDE 0.9 % IJ SOLN
3.0000 mL | Freq: Two times a day (BID) | INTRAMUSCULAR | Status: DC
  Administered 2011-08-20 – 2011-08-22 (×5): 3 mL via INTRAVENOUS

## 2011-08-20 MED ORDER — WARFARIN - PHARMACIST DOSING INPATIENT
Freq: Every day | Status: DC
  Administered 2011-08-21 – 2011-08-22 (×2)

## 2011-08-20 MED ORDER — WARFARIN SODIUM 5 MG PO TABS
5.0000 mg | ORAL_TABLET | Freq: Every day | ORAL | Status: DC
  Administered 2011-08-20 – 2011-08-22 (×3): 5 mg via ORAL
  Filled 2011-08-20 (×4): qty 1

## 2011-08-20 MED ORDER — SODIUM CHLORIDE 0.45 % IV SOLN
INTRAVENOUS | Status: DC
  Administered 2011-08-21: 10 mL/h via INTRAVENOUS

## 2011-08-20 MED ORDER — ADULT MULTIVITAMIN W/MINERALS CH
1.0000 | ORAL_TABLET | Freq: Every day | ORAL | Status: DC
  Administered 2011-08-21 – 2011-08-23 (×3): 1 via ORAL
  Filled 2011-08-20 (×3): qty 1

## 2011-08-20 MED ORDER — CHLORHEXIDINE GLUCONATE 4 % EX LIQD
60.0000 mL | Freq: Once | CUTANEOUS | Status: AC
  Administered 2011-08-21: 4 via TOPICAL
  Filled 2011-08-20: qty 60

## 2011-08-20 MED ORDER — LEVOTHYROXINE SODIUM 125 MCG PO TABS
62.5000 ug | ORAL_TABLET | Freq: Every day | ORAL | Status: DC
  Filled 2011-08-20: qty 1

## 2011-08-20 MED ORDER — SACCHAROMYCES BOULARDII 250 MG PO CAPS
250.0000 mg | ORAL_CAPSULE | Freq: Every day | ORAL | Status: DC
  Administered 2011-08-21 – 2011-08-23 (×3): 250 mg via ORAL
  Filled 2011-08-20 (×3): qty 1

## 2011-08-20 MED ORDER — GENTAMICIN SULFATE 40 MG/ML IJ SOLN
80.0000 mg | INTRAMUSCULAR | Status: AC
  Filled 2011-08-20 (×2): qty 2

## 2011-08-20 MED ORDER — VITAMIN D3 25 MCG (1000 UNIT) PO TABS
1000.0000 [IU] | ORAL_TABLET | Freq: Every day | ORAL | Status: DC
  Administered 2011-08-21 – 2011-08-23 (×3): 1000 [IU] via ORAL
  Filled 2011-08-20 (×3): qty 1

## 2011-08-20 MED ORDER — EZETIMIBE-SIMVASTATIN 10-20 MG PO TABS
1.0000 | ORAL_TABLET | Freq: Every day | ORAL | Status: DC
  Filled 2011-08-20 (×4): qty 1

## 2011-08-20 MED ORDER — VANCOMYCIN HCL IN DEXTROSE 1-5 GM/200ML-% IV SOLN
1000.0000 mg | INTRAVENOUS | Status: AC
  Filled 2011-08-20 (×2): qty 200

## 2011-08-20 NOTE — Progress Notes (Signed)
ANTICOAGULATION CONSULT NOTE - Initial Consult  Pharmacy Consult for Warfarin Indication: atrial fibrillation  Allergies  Allergen Reactions  . Cephalexin Hives  . Erythromycin Swelling and Other (See Comments)    REACTION: swollen and sore mouth  . Terbinafine Hcl     Rash   . Colchicine Other (See Comments)    REACTION: violent diarrhea  . Rosuvastatin Other (See Comments)    REACTION: upper arm pain bilaterally    Patient Measurements: Height: 5\' 4"  (162.6 cm) Weight: 158 lb 11.7 oz (72 kg) IBW/kg (Calculated) : 54.7    Vital Signs: Temp: 97.5 F (36.4 C) (07/03 1656) Temp src: Oral (07/03 1656) BP: 128/63 mmHg (07/03 1800) Pulse Rate: 70  (07/03 1800)  Labs:  Basename 08/20/11 1610  HGB --  HCT --  PLT --  APTT --  LABPROT --  INR 2.2  HEPARINUNFRC --  CREATININE --  CKTOTAL --  CKMB --  TROPONINI --    Estimated Creatinine Clearance: 44.4 ml/min (by C-G formula based on Cr of 1).   Medical History: Past Medical History  Diagnosis Date  . Cardiomyopathy     RATE RELATED  . Diverticulosis     DIVERTICULITIS  . C. difficile colitis   . Atrial fibrillation     NEG STRESS CARDIOLITE  . Gilbert's syndrome   . Hyperlipidemia     NMR LIOPROFILE LDL 234(3206/2234)HDL 37,TG86  . Thyroid disease     HYPOTHYROIDISM...THYROID NODULES  . Diverticulitis     PMH of X 2  . Hx of colonic polyp     Assessment: 81yof admitted for lead revision.  INR 2.2 at goal last dose 7/2.  No bleeding noted, cbc in June 2013 stable none drawn here yet.   Goal of Therapy:  INR 2-3 Monitor platelets by anticoagulation protocol: Yes   Plan:  Continue home Coumadin dose 5mg  daily  Daily protime  Marcelino Scot 08/20/2011,6:46 PM

## 2011-08-20 NOTE — Progress Notes (Deleted)
Device check only for diaphragmatic stim.  See PaceArt for detials.

## 2011-08-20 NOTE — Progress Notes (Signed)
 ELECTROPHYSIOLOGY OFFICE NOTE  Patient ID: Michele [name] MRN: [id number], DOB/AGE: 07/26/1929   Date of Visit: 08/20/2011  Primary Physician: William Hopper, MD Primary Cardiologist: Steven Klein, MD Reason for Visit: Device follow-up; "thumping" in chest and dizziness  History of Present Illness Michele [name] is a pleasant 78 year old woman with NICM s/p BiV ICD upgrade, permanent AF and dyslipidemia who presents today for device follow-up and reports intermittent "thumping" in her chest with associated dizziness. This has been occurring for a few weeks. She denies chest pain, shortness of breath, palpitations or syncope. She denies LE swelling, orthopnea or PND.  Past Medical History  Diagnosis Date  . Cardiomyopathy     RATE RELATED  . Diverticulosis     DIVERTICULITIS  . C. difficile colitis   . Atrial fibrillation     NEG STRESS CARDIOLITE  . Gilbert's syndrome   . Hyperlipidemia     NMR LIOPROFILE LDL 234(3206/2234)HDL 37,TG86  . Thyroid disease     HYPOTHYROIDISM...THYROID NODULES  . Diverticulitis     PMH of X 2  . Hx of colonic polyp     Past Surgical History  Procedure Date  . Biopsy thyroid     SINGLE NODULE  . Throidectomy 12/08     benign  . Appendectomy 1956  . Cholecystectomy 1972  . Abdominal hysterectomy 1973  . Pacemaker placement     10/2006  . Icd....02/2008   . Breast biopsy 1997  . Rotator cuff repair     x2  . Colonoscopy with polypectomy      X1; negative study 2012    Allergies/Intolerances Allergen Reactions  . Cephalexin     REACTION: Hives  . Erythromycin     REACTION: swollen and sore mouth  . Terbinafine Hcl     Rash   . Colchicine     REACTION: violent diarrhea  . Rosuvastatin     REACTION: upper arm pain bilaterally    Current Home Medications Vytorin 10/20 mg once daily Furosemide 20 mg daily PRN Levothyroxine 125 mcg once daily Warfarin 5 mg as directed Florastor  Biotin Calcium with vitamin D  Social  History Social History  . Marital Status: Married   Occupational History  . receptionist     retired   Social History Main Topics  . Smoking status: Never Smoker   . Smokeless tobacco: Not on file  . Alcohol Use: No  . Drug Use: No   Social History Narrative   PT. MOVED HERE FROM [place] OVER 25 YEARS AGO WITH HUSBAND.she WAS WITH [employer]A GEIGY.    Review of Systems General:  No chills, fever, night sweats or weight changes Cardiovascular:  No chest pain, dyspnea on exertion, edema, orthopnea, palpitations, paroxysmal nocturnal dyspnea Dermatological: No rash, lesions or masses Respiratory: No cough, dyspnea Urologic: No hematuria, dysuria Abdominal:   No nausea, vomiting, diarrhea, bright red blood per rectum, melena, or hematemesis Neurologic:  No visual changes, weakness, changes in mental status All other systems reviewed and are otherwise negative except as noted above.  Physical Exam Blood pressure 144/86, pulse 89, height 5' 4" (1.626 m), weight 162 lb (73.483 kg).  General: Well developed, well appearing 78 year old female in no acute distress. HEENT: Normocephalic, atraumatic. EOMs intact. Sclera nonicteric. Oropharynx clear.  Neck: Supple. No JVD. Lungs: Respirations regular and unlabored, CTA bilaterally. No wheezes, rales or rhonchi. Heart: RRR. S1, S2 present. No murmurs, rub, S3 or S4. Abdomen: Soft, non-distended. Extremities: No clubbing, cyanosis or edema.   DP/PT/Radials 2+ and equal bilaterally. Psych: Normal affect. Neuro: Alert and oriented X 3. Moves all extremities spontaneously.   Diagnostics Device interrogation today shows noise on RV lead which is inhibiting pacing; all other parameters/measurements are normal; reprogrammed to VOO to avoid asystole as she is device dependent; see PaceArt report   Assessment and Plan 1. ICD lead malfunction   Michele [name] is device dependent and will need to be admitted for lead revision. We will admit her to the CCU for  close observation. Her device has been reprogrammed to VOO to avoid asystole. She will undergo lead revision in the morning with Dr. Klein. The procedure has been reviewed with Michele [name] and her husband, including risks and benefits. These risks include, but are not limited to, bleeding, infection, pneumothorax, perforation, vascular damage, renal failure, lead dislodgement, MI, stroke and death. Michele [name] and her husband expressed verbal understanding and agree to proceed.  This plan of care was formulated with Dr. Steven Klein who was in to see the patient with me today. Signed, Ronetta Molla, PA-C 08/20/2011, 4:49 PM    

## 2011-08-21 ENCOUNTER — Encounter (HOSPITAL_COMMUNITY): Payer: Self-pay | Admitting: *Deleted

## 2011-08-21 ENCOUNTER — Ambulatory Visit (HOSPITAL_COMMUNITY): Admit: 2011-08-21 | Payer: Self-pay | Admitting: Internal Medicine

## 2011-08-21 ENCOUNTER — Encounter (HOSPITAL_COMMUNITY)
Admission: AD | Disposition: A | Discharge status: Home / Self Care | Payer: Self-pay | Source: Ambulatory Visit | Attending: Internal Medicine

## 2011-08-21 DIAGNOSIS — T82198A Other mechanical complication of other cardiac electronic device, initial encounter: Secondary | ICD-10-CM

## 2011-08-21 HISTORY — PX: LEAD REVISION: SHX5945

## 2011-08-21 SURGERY — LEAD REVISION
Anesthesia: LOCAL

## 2011-08-21 MED ORDER — MIDAZOLAM HCL 2 MG/2ML IJ SOLN
INTRAMUSCULAR | Status: AC
  Filled 2011-08-21: qty 2

## 2011-08-21 MED ORDER — LEVOTHYROXINE SODIUM 125 MCG PO TABS
125.0000 ug | ORAL_TABLET | ORAL | Status: DC
  Administered 2011-08-21 – 2011-08-23 (×2): 125 ug via ORAL
  Filled 2011-08-21 (×3): qty 1

## 2011-08-21 MED ORDER — ONDANSETRON HCL 4 MG/2ML IJ SOLN: 4.0000 mg | Freq: Four times a day (QID) | INTRAMUSCULAR | Status: DC | PRN

## 2011-08-21 MED ORDER — FENTANYL CITRATE 0.05 MG/ML IJ SOLN
INTRAMUSCULAR | Status: AC
  Filled 2011-08-21: qty 2

## 2011-08-21 MED ORDER — HEPARIN (PORCINE) IN NACL 2-0.9 UNIT/ML-% IJ SOLN
INTRAMUSCULAR | Status: AC
  Filled 2011-08-21: qty 1000

## 2011-08-21 MED ORDER — LEVOTHYROXINE SODIUM 125 MCG PO TABS
62.5000 ug | ORAL_TABLET | ORAL | Status: DC
  Administered 2011-08-22: 09:00:00 via ORAL
  Filled 2011-08-21: qty 0.5

## 2011-08-21 MED ORDER — SODIUM CHLORIDE 0.9 % IV SOLN: INTRAVENOUS | Status: AC

## 2011-08-21 MED ORDER — ACETAMINOPHEN 325 MG PO TABS: 325.0000 mg | ORAL_TABLET | ORAL | Status: DC | PRN

## 2011-08-21 MED ORDER — VANCOMYCIN HCL IN DEXTROSE 1-5 GM/200ML-% IV SOLN
1000.0000 mg | Freq: Two times a day (BID) | INTRAVENOUS | Status: AC
  Administered 2011-08-21: 1000 mg via INTRAVENOUS
  Filled 2011-08-21: qty 200

## 2011-08-21 MED ORDER — LIDOCAINE HCL (PF) 1 % IJ SOLN
INTRAMUSCULAR | Status: AC
  Filled 2011-08-21: qty 60

## 2011-08-21 NOTE — H&P (View-Only) (Signed)
ELECTROPHYSIOLOGY OFFICE NOTE  Patient ID: Elveria Lauderbaugh MRN: [id number], DOB/AGE: 76/22/1931   Date of Visit: 08/20/2011  Primary Physician: Marga Melnick, MD Primary Cardiologist: Sherryl Manges, MD Reason for Visit: Device follow-up; "thumping" in chest and dizziness  History of Present Illness Ms. Meisinger is a pleasant 78 year old woman with NICM s/p BiV ICD upgrade, permanent AF and dyslipidemia who presents today for device follow-up and reports intermittent "thumping" in her chest with associated dizziness. This has been occurring for a few weeks. She denies chest pain, shortness of breath, palpitations or syncope. She denies LE swelling, orthopnea or PND.  Past Medical History  Diagnosis Date  . Cardiomyopathy     RATE RELATED  . Diverticulosis     DIVERTICULITIS  . C. difficile colitis   . Atrial fibrillation     NEG STRESS CARDIOLITE  . Gilbert's syndrome   . Hyperlipidemia     NMR LIOPROFILE LDL 234(3206/2234)HDL 37,TG86  . Thyroid disease     HYPOTHYROIDISM...THYROID NODULES  . Diverticulitis     PMH of X 2  . Hx of colonic polyp     Past Surgical History  Procedure Date  . Biopsy thyroid     SINGLE NODULE  . Throidectomy 12/08     benign  . Appendectomy 1956  . Cholecystectomy 1972  . Abdominal hysterectomy 1973  . Pacemaker placement     10/2006  . Icd....02/2008   . Breast biopsy 1997  . Rotator cuff repair     x2  . Colonoscopy with polypectomy      X1; negative study 2012    Allergies/Intolerances Allergen Reactions  . Cephalexin     REACTION: Hives  . Erythromycin     REACTION: swollen and sore mouth  . Terbinafine Hcl     Rash   . Colchicine     REACTION: violent diarrhea  . Rosuvastatin     REACTION: upper arm pain bilaterally    Current Home Medications Vytorin 10/20 mg once daily Furosemide 20 mg daily PRN Levothyroxine 125 mcg once daily Warfarin 5 mg as directed Florastor  Biotin Calcium with vitamin D  Social  History Social History  . Marital Status: Married   Occupational History  . receptionist     retired   Social History Main Topics  . Smoking status: Never Smoker   . Smokeless tobacco: Not on file  . Alcohol Use: No  . Drug Use: No   Social History Narrative   PT. MOVED HERE FROM [place] OVER 25 YEARS AGO WITH HUSBAND.she WAS WITH [employer] GEIGY.    Review of Systems General:  No chills, fever, night sweats or weight changes Cardiovascular:  No chest pain, dyspnea on exertion, edema, orthopnea, palpitations, paroxysmal nocturnal dyspnea Dermatological: No rash, lesions or masses Respiratory: No cough, dyspnea Urologic: No hematuria, dysuria Abdominal:   No nausea, vomiting, diarrhea, bright red blood per rectum, melena, or hematemesis Neurologic:  No visual changes, weakness, changes in mental status All other systems reviewed and are otherwise negative except as noted above.  Physical Exam Blood pressure 144/86, pulse 89, height 5\' 4"  (1.626 m), weight 162 lb (73.483 kg).  General: Well developed, well appearing 78 year old female in no acute distress. HEENT: Normocephalic, atraumatic. EOMs intact. Sclera nonicteric. Oropharynx clear.  Neck: Supple. No JVD. Lungs: Respirations regular and unlabored, CTA bilaterally. No wheezes, rales or rhonchi. Heart: RRR. S1, S2 present. No murmurs, rub, S3 or S4. Abdomen: Soft, non-distended. Extremities: No clubbing, cyanosis or edema.  DP/PT/Radials 2+ and equal bilaterally. Psych: Normal affect. Neuro: Alert and oriented X 3. Moves all extremities spontaneously.   Diagnostics Device interrogation today shows noise on RV lead which is inhibiting pacing; all other parameters/measurements are normal; reprogrammed to VOO to avoid asystole as she is device dependent; see PaceArt report   Assessment and Plan 1. ICD lead malfunction   Ms. Kirchoff is device dependent and will need to be admitted for lead revision. We will admit her to the CCU for  close observation. Her device has been reprogrammed to VOO to avoid asystole. She will undergo lead revision in the morning with Dr. Graciela Husbands. The procedure has been reviewed with Ms. Graffam and her husband, including risks and benefits. These risks include, but are not limited to, bleeding, infection, pneumothorax, perforation, vascular damage, renal failure, lead dislodgement, MI, stroke and death. Ms. Bevel and her husband expressed verbal understanding and agree to proceed.  This plan of care was formulated with Dr. Sherryl Manges who was in to see the patient with me today. Signed, Rick Duff, PA-C 08/20/2011, 4:49 PM

## 2011-08-21 NOTE — Op Note (Signed)
[name], Michele [name] NO.:  [id number]  MEDICAL RECORD NO.:  [id number]  LOCATION:  2919                         FACILITY:  [facility]  PHYSICIAN:  Duke Salvia, MD, FACCDATE OF BIRTH:  Sep 07, 1929  DATE OF PROCEDURE:  08/21/2011 DATE OF DISCHARGE:                              OPERATIVE REPORT   PREOPERATIVE DIAGNOSIS:  Lead sensing failure.  POSTOPERATIVE DIAGNOSIS:  Lead sensing failure; diaphragmatic stimulation of the previously implanted ICD rate sense lead, which had been capped.  PROCEDURE:  Contrast venogram, insertion of a new lead, high-voltage lead assessment, pocket revision, and device explantation and new device implantation.  Following obtaining informed consent, the patient was brought to the electrophysiology laboratory and placed on the fluoroscopic table in supine position.  After routine prep and drape of the left upper chest, a contrast venogram was obtained that suggested patency of the extrathoracic left subclavian vein.  Incision was then made in the line of the previous incision and carried down to layer of the prepectoral fascia with the use of electrocautery and blunt dissection.  The pocket was opened and tapped rate sense lead, which I expected to be the 5076 ventricular lead, was near the top of the pocket.  It turned out; however, this was the rate sense portion of the defibrillator lead and upon interrogation of this lead, diaphragmatic stimulation was noted. It was then elected to pursue insertion of a new ventricular lead.  The vein was cannulated with minimal difficulty.  A 7-French and 8-French dilators were used and after which, a 7-French sheath was placed and through this was passed a Medtronic 58 cm, 5076 lead, serial number [id number].  Under fluoroscopic guidance, it was manipulated to the midportion of the right ventricular septum between the previously implanted RV lead and the defibrillator lead which was in the  apex.  In this location, the sensed paced R-waves were 6.9 with a pace impedance of 952 with threshold 1 V at 0.5 msec, there was no diaphragmatic pacing at 10 V, current of injury was brisk, and current of threshold was about 1 mA.  This lead was secured to the prepectoral fascia and then the other leads were removed and attached to a new Medtronic device, model D314TRG, serial number [id number].  A new device was utilized because we had seen noise in the office with mechanical manipulation recorded on both the atrial and ventricular leads, suggesting that the over sensing issue may have been driven not by lead failure, but by device failure. As we could not be certain which of these 2 was and the patient was device-dependent, it was elected to replace both.  Through the device, the atrial lead impedance was 494, but she has atrial fibrillation.  The LV impedance was 855.  The RV impedance was 627.  Threshold in the RV was 1 V at 0.4.  LV threshold was deferred.  High proximal coil impedance was 61 ohms.  Distal coil impedance was 63 ohms.  The pocket was expanded, hemostasis was obtained, the leads were all freed up and reinserted into the pocket in a standard fashion, and the device was implanted with an Aegis antimicrobial pouch, which was secured  at the cephalad aspect of the pocket.  I should note that the previously implanted ventricular lead was capped, the rate sense portion of the defibrillator lead was recapped, and both were placed behind the device in the pocket.  The pocket was copiously irrigated with antibiotic- containing saline solution.  Hemostasis was assured.  Surgicel was placed on the cephalad anterior and posterior aspects of the pocket, and the wound was then closed in 3 layers in a normal fashion.  The wound was washed, dried, and a Benzoin and Steri-Strip dressing was applied. Needle counts, sponge counts, and instrument counts were correct at the end of the  procedure according to the staff.  The patient tolerated the procedure without apparent complication.     Duke Salvia, MD, [facility]     SCK/MEDQ  D:  08/21/2011  T:  08/21/2011  Job:  [id number]

## 2011-08-21 NOTE — Progress Notes (Signed)
BACK FROM THE CATH LAB BY BED AWAKE AND ALERT, INSTRUCTED TO AVOID MOVING  LEFT ARM , DRESSING DRY AND INTACT. EXTERNAL PADS APPLIED AND ATTACHED TO ZOLL.

## 2011-08-21 NOTE — Progress Notes (Signed)
ANTICOAGULATION CONSULT NOTE - Follow Up Consult  Pharmacy Consult for Coumadin Indication: atrial fibrillation  Allergies  Allergen Reactions  . Cephalexin Hives  . Erythromycin Swelling and Other (See Comments)    REACTION: swollen and sore mouth  . Terbinafine Hcl     Rash   . Colchicine Other (See Comments)    REACTION: violent diarrhea  . Rosuvastatin Other (See Comments)    REACTION: upper arm pain bilaterally   Vital Signs: Temp: 97.4 F (36.3 C) (07/04 0800) Temp src: Oral (07/04 0800) BP: 122/70 mmHg (07/04 0800) Pulse Rate: 70  (07/04 0800)  Labs:  Basename 08/21/11 0440 08/20/11 2004 08/20/11 1610  HGB -- 14.3 --  HCT -- 40.3 --  PLT -- 169 --  APTT -- -- --  LABPROT 25.1* -- --  INR 2.23* -- 2.2  HEPARINUNFRC -- -- --  CREATININE -- 0.96 --  CKTOTAL -- -- --  CKMB -- -- --  TROPONINI -- -- --    Estimated Creatinine Clearance: 46.2 ml/min (by C-G formula based on Cr of 0.96).  Assessment: 81yof on coumadin pta for afib, admitted for ICD lead revision which is to be done today. INR remains therapeutic on home dose of 5mg  daily. No bleeding per chart notes. Baseline CBC wnl.  Goal of Therapy:  INR 2-3 Monitor platelets by anticoagulation protocol: Yes  Plan:  1) Continue coumadin 5mg  daily  2) Follow up INR in AM  Fredrik Rigger 08/21/2011,9:01 AM

## 2011-08-21 NOTE — Progress Notes (Signed)
AICD BOOKLET AND TEMPORARY CARD GIVEN TO PT AND SPOUSE.Marland Kitchen

## 2011-08-21 NOTE — CV Procedure (Signed)
Michele [name] [id number]  [id number]  Preop Dx: lead failure or device failure with inhibition of pacing in device dependent patient Postop Dx same/patent vein  Procedure:contrast venogram Device explantation RV lead insertin Device-new implanation Pocket revision    Dictation number [id number]  Sherryl Manges, MD 08/21/2011 12:06 PM

## 2011-08-21 NOTE — Progress Notes (Signed)
TO THE CATH LAB BY BED, STABLE, REPORT GIVEN TO RN.

## 2011-08-21 NOTE — Progress Notes (Signed)
Orthopedic Tech Progress Note Patient Details:  Michele [name] 1929/04/14 [id number]  Ortho Devices Type of Ortho Device: Arm foam sling Ortho Device/Splint Interventions: Application   [name], [name] 08/21/2011, 1:58 PM

## 2011-08-21 NOTE — Interval H&P Note (Signed)
History and Physical Interval Note:  08/21/2011 9:06 AM  Michele [name]  has presented today for surgery, with the diagnosis of Bad lead  The various methods of treatment have been discussed with the patient and family. After consideration of risks, benefits and other options for treatment, the patient has consented to  Procedure(s) (LRB): LEAD REVISION (N/A) as a surgical intervention .  The patient's history has been reviewed, patient examined, no change in status, stable for surgery.  I have reviewed the patients' chart and labs.  Questions were answered to the patient's satisfaction.     Sherryl Manges

## 2011-08-21 NOTE — Progress Notes (Signed)
LEFT ARM SLING APPLIED BY THE [facility].

## 2011-08-21 NOTE — Assessment & Plan Note (Signed)
Lead noise noted, potentially on both leads, suggesting problem with the device  She is also complaoning of thumping the cause of which is not clear

## 2011-08-22 ENCOUNTER — Inpatient Hospital Stay (HOSPITAL_COMMUNITY): Payer: Medicare Other

## 2011-08-22 LAB — PROTIME-INR: INR: 2.24 — ABNORMAL HIGH (ref 0.00–1.49)

## 2011-08-22 NOTE — Progress Notes (Signed)
ANTICOAGULATION CONSULT NOTE - Follow Up Consult  Pharmacy Consult for Coumadin Indication: atrial fibrillation  Allergies  Allergen Reactions  . Cephalexin Hives  . Erythromycin Swelling and Other (See Comments)    REACTION: swollen and sore mouth  . Terbinafine Hcl     Rash   . Colchicine Other (See Comments)    REACTION: violent diarrhea  . Rosuvastatin Other (See Comments)    REACTION: upper arm pain bilaterally   Vital Signs: Temp: 98.1 F (36.7 C) (07/05 0744) Temp src: Oral (07/05 0744) BP: 128/71 mmHg (07/05 0745) Pulse Rate: 70  (07/05 0744)  Labs:  Basename 08/22/11 0537 08/21/11 0440 08/20/11 2004 08/20/11 1610  HGB -- -- 14.3 --  HCT -- -- 40.3 --  PLT -- -- 169 --  APTT -- -- -- --  LABPROT 25.2* 25.1* -- --  INR 2.24* 2.23* -- 2.2  HEPARINUNFRC -- -- -- --  CREATININE -- -- 0.96 --  CKTOTAL -- -- -- --  CKMB -- -- -- --  TROPONINI -- -- -- --    Estimated Creatinine Clearance: 46.2 ml/min (by C-G formula based on Cr of 0.96).  Assessment: 81yof on coumadin pta for afib,s/p ICD lead revision 7/4. INR remains therapeutic on home dose of 5mg  daily. No bleeding per chart notes. Baseline CBC wnl.  Goal of Therapy:  INR 2-3 Monitor platelets by anticoagulation protocol: Yes  Plan:  1) Continue coumadin 5mg  daily  2) Follow up INR in AM  Estella Husk, Pharm.D., BCPS Clinical Pharmacist  Phone [phone] Pager [phone] 08/22/2011, 9:36 AM

## 2011-08-22 NOTE — Discharge Summary (Signed)
ELECTROPHYSIOLOGY DISCHARGE SUMMARY    Patient ID: Michele [name],  MRN: [id number], DOB/AGE: 18-Nov-1929 78 y.o.  Admit date: 08/20/2011 Discharge date: 08/23/2011  Primary Care Physician: Marga Melnick, MD Primary Cardiologist: Sherryl Manges, MD  Primary Discharge Diagnosis:  1. BiV ICD, RV lead malfunction s/p generator change and RV lead revision 08/21/2011  Secondary Discharge Diagnoses:  1. NICM 2. Atrial fibrillation 3. Dyslipidemia 4. Gilbert's syndrome 5. Hypothyroidism  Procedures This Admission:  1. BiV ICD generator change and RV lead revision Contrast venogram, insertion of a new lead, high-voltage lead assessment, pocket revision, and device explantation and new device implantation. Medtronic 58 cm, 5076 lead, serial number [id number]. Under fluoroscopic guidance, it was manipulated to the midportion of the right ventricular septum between the previously implanted RV lead and the defibrillator lead which was in the apex. In this location, the sensed paced R-waves were 6.9 with a pace impedance of 952 with threshold 1 V at 0.5 msec, there was no diaphragmatic pacing at 10 V, current of injury was brisk, and current of threshold was about 1 mA. This lead was secured to the prepectoral fascia and then the other leads were removed and attached to a new Medtronic device, model D314TRG, serial number [id number].  History and Hospital Course:  [name] is a pleasant 78 year old woman with NICM s/p BiV ICD upgrade, permanent AF and dyslipidemia who presented to the office on 08/20/2011 for device follow-up and reported intermittent "thumping" in her chest with associated dizziness. This had been occurring for a few weeks. She denied chest pain, shortness of breath, palpitations or syncope. She denied LE swelling, orthopnea or PND. Upon interrogating her device, we found noise on both her atrial and RV lead which inhibited pacing. She is device dependent and was therefore directly  admitted to [facility] CCU from clinic. Her device was temporarily reprogrammed to VOO mode to avoid asystole. She underwent BiV ICD generator change and RV lead revision yesterday with Dr. Graciela Husbands. Please see operative report for full details. A new device was utilized because we saw noise in the office with mechanical manipulation recorded on both the atrial and ventricular lead, suggesting that the oversensing issue may have been driven not only by lead failure, but by device failure. As we could not be certain which of these two was the primary culprit, and she is device-dependent, Dr. Graciela Husbands elected to replace both. She tolerated the procedure well without any immediate complication. She remains hemodynamically stable. Her device interrogation shows normal BiV ICD function with stable lead parameters. Her chest xray shows stable lead placement without pneumothorax. Her implant site is intact without significant bleeding or hematoma.   Discharge Vitals: Blood pressure 114/76, pulse 69, temperature 98.3 F (36.8 C), temperature source Oral, resp. rate 16, height 5\' 4"  (1.626 m), weight 158 lb 11.7 oz (72 kg), SpO2 97.00%.   Labs: Lab Results  Component Value Date   WBC 6.6 08/20/2011   HGB 14.3 08/20/2011   HCT 40.3 08/20/2011   MCV 88.6 08/20/2011   PLT 169 08/20/2011     Lab 08/20/11 2004  NA 140  K 3.7  CL 104  CO2 24  BUN 15  CREATININE 0.96  CALCIUM 9.6  PROT 6.9  BILITOT 1.2  ALKPHOS 59  ALT 14  AST 20  GLUCOSE 96    Basename 08/23/11 0528  INR 2.26*    Disposition:  The patient is being discharged in stable condition.  Follow-up: Follow-up Information  Follow up with [facility] on 09/03/2011. (At 10:00 AM for wound check)    Contact information:   [address]  [address] [place] [address] [phone]       Follow up with Sherryl Manges, MD on 12/16/2011. (At 9:30 AM)    Contact information:   [address] [place]  [address] [phone]       Follow up with [facility] on 09/12/2011. (3:00 PM)        Discharge Medications:  Medication List  As of 08/23/2011 10:23 AM   TAKE these medications         BIOTIN PO   Take 1 tablet by mouth daily as needed. For hair and nails      cholecalciferol 1000 UNITS tablet   Commonly known as: VITAMIN D   Take 1,000 Units by mouth daily.      CITRACAL PO   Take 1 tablet by mouth daily.      co-enzyme Q-10 30 MG capsule   Take 30 mg by mouth daily.      ezetimibe-simvastatin 10-20 MG per tablet   Commonly known as: VYTORIN   Take 1 tablet by mouth at bedtime.      FLORASTOR 250 MG capsule   Generic drug: saccharomyces boulardii   Take 250 mg by mouth daily.      furosemide 20 MG tablet   Commonly known as: LASIX   Take 20 mg by mouth daily as needed. For fluid      levothyroxine 125 MCG tablet   Commonly known as: SYNTHROID, LEVOTHROID   Take 62.5-125 mcg by mouth daily. TAKE 1/2 TABLET ON MONDAY, WEDNESDAY,FRIDAY, AND SUNDAY, AND 1 TAB ON TUES,THURS,SAT.      multivitamin with minerals Tabs   Take 1 tablet by mouth daily.      warfarin 5 MG tablet   Commonly known as: COUMADIN   Take 5 mg by mouth every evening.          Duration of Discharge Encounter: Greater than 30 minutes including physician time.  Signed, Nicolasa Ducking, NP 08/23/2011, 10:23 AM

## 2011-08-22 NOTE — Progress Notes (Signed)
     Patient: Michele [name] Date of Encounter: 08/22/2011, 8:45 AM Admit date: 08/20/2011     Subjective  Ms. Michele [name] has no complaints this AM s/p RV lead revision and generator change. She denies chest pain, shortness of breath, palpitations, dizziness, near syncope or syncope.    Objective  Physical Exam: Vitals: BP 128/71  Pulse 70  Temp 98.1 F (36.7 C) (Oral)  Resp 17  Ht 5\' 4"  (1.626 m)  Wt 158 lb 11.7 oz (72 kg)  BMI 27.25 kg/m2  SpO2 98% General: Well developed, well appearing 78 year old female in no acute distress. Neck: Supple. JVD not elevated. Lungs: Clear bilaterally to auscultation without wheezes, rales, or rhonchi. Breathing is unlabored. Heart: S1 S2 without murmurs, rubs, or gallops.  Abdomen: Soft, non-distended. Extremities: No clubbing or cyanosis. No edema.  Distal pedal pulses are 2+ and equal bilaterally. Neuro: Alert and oriented X 3. Moves all extremities spontaneously. No focal deficits. Skin: Implant site intact without significant bleeding or hematoma.  Intake/Output: Intake/Output Summary (Last 24 hours) at 08/22/11 0755 Last data filed at 08/21/11 2100  Gross per 24 hour  Intake    610 ml  Output   1050 ml  Net   -440 ml   Inpatient Medications:  . cholecalciferol  1,000 Units Oral Daily  . ezetimibe-simvastatin  1 tablet Oral QHS  . gentamicin irrigation  80 mg Irrigation On Call  . levothyroxine  125 mcg Oral Custom  . levothyroxine  62.5 mcg Oral Custom  . multivitamin with minerals  1 tablet Oral Daily  . saccharomyces boulardii  250 mg Oral Daily  . sodium chloride  3 mL Intravenous Q12H  . vancomycin  1,000 mg Intravenous On Call  . vancomycin  1,000 mg Intravenous Q12H  . warfarin  5 mg Oral q1800  . Warfarin - Pharmacist Dosing Inpatient   Does not apply q1800   . sodium chloride 50 mL/hr at 08/21/11 1245   Labs:  [facility] 08/20/11 2004  NA 140  K 3.7  CL 104  CO2 24  GLUCOSE 96  BUN 15  CREATININE 0.96  CALCIUM 9.6    MG --  PHOS --    Basename 08/20/11 2004  AST 20  ALT 14  ALKPHOS 59  BILITOT 1.2  PROT 6.9  ALBUMIN 3.6    Basename 08/20/11 2004  WBC 6.6  NEUTROABS 2.9  HGB 14.3  HCT 40.3  MCV 88.6  PLT 169   Radiology/Studies: Chest x-ray this AM IMPRESSION: Interval placement of a ventricular leads without radiographic evidence for complication. Mild cardiomegaly without failure. Original Report Authenticated By: Jamesetta Orleans. MATTERN, M.D.  Telemetry: V pacing with underlying AF Device interrogation: normal BiV ICD function with stable lead parameters   Assessment and Plan  1. BiV ICD, RV lead malfunction - s/p generator change and RV lead revision; doing well this AM with no complaints; will transfer to telemetry today; plan for DC in AM  Dr. Graciela Husbands to follow Signed, Rick Duff PA-C

## 2011-08-22 NOTE — Care Management Note (Signed)
    Page 1 of 1   08/22/2011     12:19:55 PM   CARE MANAGEMENT NOTE 08/22/2011  Patient:  Michele [name], Michele [name]   Account Number:  [id number]  Date Initiated:  08/22/2011  Documentation initiated by:  Junius Creamer  Subjective/Objective Assessment:   adm w aicd lead malfunction     Action/Plan:   lives w husband, pcp dr Chrissie Noa hopper   Anticipated DC Date:     Anticipated DC Plan:  HOME/SELF CARE      DC Planning Services  CM consult      Choice offered to / List presented to:             Status of service:   Medicare Important Message given?   (If response is "NO", the following Medicare IM given date fields will be blank) Date Medicare IM given:   Date Additional Medicare IM given:    Discharge Disposition:  HOME/SELF CARE  Per UR Regulation:  Reviewed for med. necessity/level of care/duration of stay  If discussed at Long Length of Stay Meetings, dates discussed:    Comments:  7/5 12:18p, debbie Truddie Coco, bsn [phone]

## 2011-08-22 NOTE — Progress Notes (Signed)
TRANSFERRED TO [id number] BY WHEELCHAIR, STABLE ,BELONGINGS WITH PT. REPORT GIVEN TO RN.

## 2011-08-23 NOTE — Progress Notes (Signed)
ANTICOAGULATION CONSULT NOTE - Follow Up Consult  Pharmacy Consult for Coumadin Indication: atrial fibrillation  Labs:  Basename 08/23/11 0528 08/22/11 0537 08/21/11 0440 08/20/11 2004  HGB -- -- -- 14.3  HCT -- -- -- 40.3  PLT -- -- -- 169  APTT -- -- -- --  LABPROT 25.3* 25.2* 25.1* --  INR 2.26* 2.24* 2.23* --  HEPARINUNFRC -- -- -- --  CREATININE -- -- -- 0.96  CKTOTAL -- -- -- --  CKMB -- -- -- --  TROPONINI -- -- -- --    Estimated Creatinine Clearance: 46.2 ml/min (by C-G formula based on Cr of 0.96).  Assessment: 81yof on coumadin pta for afib,s/p ICD lead revision 7/4. INR remains therapeutic on home dose of 5mg  daily. No bleeding per chart notes. Baseline CBC wnl.  Goal of Therapy:  INR 2-3  Plan:  1) Continue coumadin 5mg  daily  2) Follow up INR in AM  Sheppard Coil, Pharm.D., BCPS Clinical Pharmacist  Phone [phone] Pager [phone] 08/23/2011, 8:04 AM

## 2011-08-23 NOTE — Progress Notes (Signed)
  Patient Name: Michele [name]      SUBJECTIVE: admitted with over sensing related to me for device malfunction. She underwent device generator replacement and lead replacement. She is device dependent.  Without complaint  Past Medical History  Diagnosis Date  . Cardiomyopathy     RATE RELATED  . Diverticulosis     DIVERTICULITIS  . C. difficile colitis   . Atrial fibrillation     NEG STRESS CARDIOLITE  . Gilbert's syndrome   . Hyperlipidemia     NMR LIOPROFILE LDL 234(3206/2234)HDL 37,TG86  . Thyroid disease     HYPOTHYROIDISM...THYROID NODULES  . Diverticulitis     PMH of X 2  . Hx of colonic polyp     PHYSICAL EXAM Filed Vitals:   08/22/11 1123 08/22/11 1234 08/22/11 2053 08/23/11 0610  BP: 123/72 129/75 115/71 114/76  Pulse: 70 69 69 69  Temp: 98.3 F (36.8 C) 97.7 F (36.5 C) 97.8 F (36.6 C) 98.3 F (36.8 C)  TempSrc: Oral Oral Oral Oral  Resp:  18 17 16   Height:      Weight:      SpO2: 99% 97% 97% 97%    Well developed and nourished in no acute distress HENT normal Neck supple with JVP-flat Clear Regular rate and rhythm, no murmurs or gallops Abd-soft with active BS No Clubbing cyanosis edema Skin-warm and dry A & Oriented  Grossly normal sensory and motor function   TELEMETRY: Reviewed telemetry pt in v pacingt:    Intake/Output Summary (Last 24 hours) at 08/23/11 0716 Last data filed at 08/22/11 2158  Gross per 24 hour  Intake    206 ml  Output    800 ml  Net   -594 ml    LABS: Basic Metabolic Panel:  Lab 08/20/11 0981  NA 140  K 3.7  CL 104  CO2 24  GLUCOSE 96  BUN 15  CREATININE 0.96  CALCIUM 9.6  MG --  PHOS --   Cardiac Enzymes: No results found for this basename: CKTOTAL:3,CKMB:3,CKMBINDEX:3,TROPONINI:3 in the last 72 hours CBC:  Lab 08/20/11 2004  WBC 6.6  NEUTROABS 2.9  HGB 14.3  HCT 40.3  MCV 88.6  PLT 169   PROTIME:  Basename 08/23/11 0528 08/22/11 0537 08/21/11 0440  LABPROT 25.3* 25.2* 25.1*  INR  2.26* 2.24* 2.23*   Liver Function Tests:  Basename 08/20/11 2004  AST 20  ALT 14  ALKPHOS 59  BILITOT 1.2  PROT 6.9  ALBUMIN 3.6     ASSESSMENT AND PLAN:  Patient Active Hospital Problem List: AICD lead malfunction (08/20/2011)   AUTOMATIC IMPLANTABLE CARDIAC DEFIBRILLATOR SITU (04/19/2008)   Pt doing well. Instructions were given. Will be checked in 10 days Followup SK3 months Continue Coumadin     Signed, Sherryl Manges MD  08/23/2011

## 2011-08-24 NOTE — Discharge Summary (Signed)
S/p lead revison and generator replacement 2/2 oversensing and inhibitionof pacing

## 2011-08-25 ENCOUNTER — Telehealth: Payer: Self-pay | Admitting: Internal Medicine

## 2011-08-25 NOTE — Telephone Encounter (Signed)
I spoke with the patient's husband and explained the patient may remove her outer bandage and leave the steri strips on. He verbalizes understanding.

## 2011-08-25 NOTE — Telephone Encounter (Signed)
New Problem:    Patient's husband called with a question about when his wife's bandage should be taken off.  Please call back.

## 2011-08-26 ENCOUNTER — Encounter: Payer: Self-pay | Admitting: Internal Medicine

## 2011-08-28 ENCOUNTER — Encounter: Payer: Self-pay | Admitting: *Deleted

## 2011-09-03 ENCOUNTER — Encounter: Payer: Self-pay | Admitting: Internal Medicine

## 2011-09-03 ENCOUNTER — Ambulatory Visit (INDEPENDENT_AMBULATORY_CARE_PROVIDER_SITE_OTHER): Payer: Medicare Other | Admitting: *Deleted

## 2011-09-03 DIAGNOSIS — I4891 Unspecified atrial fibrillation: Secondary | ICD-10-CM

## 2011-09-03 DIAGNOSIS — I429 Cardiomyopathy, unspecified: Secondary | ICD-10-CM

## 2011-09-03 LAB — ICD DEVICE OBSERVATION
AL AMPLITUDE: 1.5 mv
BATTERY VOLTAGE: 3.2071 V
FVT: 0
LV LEAD THRESHOLD: 1.25 V
PACEART VT: 0
RV LEAD IMPEDENCE ICD: 532 Ohm
RV LEAD THRESHOLD: 0.875 V
TOT-0001: 0
TZAT-0001SLOWVT: 1
TZAT-0001SLOWVT: 2
TZAT-0002ATACH: NEGATIVE
TZAT-0002ATACH: NEGATIVE
TZAT-0005FASTVT: 88 pct
TZAT-0011FASTVT: 10 ms
TZAT-0011SLOWVT: 10 ms
TZAT-0012ATACH: 150 ms
TZAT-0012FASTVT: 170 ms
TZAT-0018ATACH: NEGATIVE
TZAT-0018FASTVT: NEGATIVE
TZAT-0018SLOWVT: NEGATIVE
TZAT-0018SLOWVT: NEGATIVE
TZAT-0019ATACH: 6 V
TZAT-0019ATACH: 6 V
TZAT-0019SLOWVT: 8 V
TZAT-0019SLOWVT: 8 V
TZAT-0020ATACH: 1.5 ms
TZAT-0020ATACH: 1.5 ms
TZAT-0020SLOWVT: 1.5 ms
TZAT-0020SLOWVT: 1.5 ms
TZON-0003FASTVT: 210 ms
TZON-0003SLOWVT: 360 ms
TZON-0003VSLOWVT: 450 ms
TZON-0004SLOWVT: 40
TZST-0001ATACH: 5
TZST-0001ATACH: 6
TZST-0001FASTVT: 3
TZST-0001FASTVT: 5
TZST-0001SLOWVT: 4
TZST-0001SLOWVT: 5
TZST-0002ATACH: NEGATIVE
TZST-0002ATACH: NEGATIVE
TZST-0003FASTVT: 35 J
TZST-0003FASTVT: 35 J
TZST-0003SLOWVT: 15 J
TZST-0003SLOWVT: 35 J
TZST-0003SLOWVT: 35 J
VF: 0

## 2011-09-03 NOTE — Progress Notes (Signed)
Wound check-ICD 

## 2011-09-09 ENCOUNTER — Ambulatory Visit (INDEPENDENT_AMBULATORY_CARE_PROVIDER_SITE_OTHER): Payer: Medicare Other | Admitting: *Deleted

## 2011-09-09 DIAGNOSIS — I4891 Unspecified atrial fibrillation: Secondary | ICD-10-CM

## 2011-09-09 LAB — POCT INR: INR: 2.6

## 2011-09-24 ENCOUNTER — Emergency Department (HOSPITAL_COMMUNITY)
Admission: EM | Admit: 2011-09-24 | Discharge: 2011-09-24 | Disposition: A | Discharge status: Home / Self Care | Payer: Medicare Other | Source: Home / Self Care | Attending: Family Medicine | Admitting: Family Medicine

## 2011-09-24 ENCOUNTER — Encounter (HOSPITAL_COMMUNITY): Payer: Self-pay

## 2011-09-24 ENCOUNTER — Emergency Department (INDEPENDENT_AMBULATORY_CARE_PROVIDER_SITE_OTHER): Payer: Medicare Other

## 2011-09-24 DIAGNOSIS — S60222A Contusion of left hand, initial encounter: Secondary | ICD-10-CM

## 2011-09-24 DIAGNOSIS — S60229A Contusion of unspecified hand, initial encounter: Secondary | ICD-10-CM

## 2011-09-24 DIAGNOSIS — I4891 Unspecified atrial fibrillation: Secondary | ICD-10-CM

## 2011-09-24 DIAGNOSIS — S6390XA Sprain of unspecified part of unspecified wrist and hand, initial encounter: Secondary | ICD-10-CM

## 2011-09-24 DIAGNOSIS — IMO0002 Reserved for concepts with insufficient information to code with codable children: Secondary | ICD-10-CM

## 2011-09-24 NOTE — ED Provider Notes (Signed)
History     CSN: [id number]  Arrival date & time 09/24/11  1122   First MD Initiated Contact with Patient 09/24/11 1204      Chief Complaint  Patient presents with  . Hand Injury    (Consider location/radiation/quality/duration/timing/severity/associated sxs/prior treatment) HPI Comments: 78 year old female with complex cardiac medical history including chronic anticoagulation. Here complaining of pain and and bruising in her left hand after an injury that occurred yesterday when she slipped while getting out of the bath tub over stretching her fingers trying to support herself. Pain worse in the palm of the hand and swelling over the knuckles somehow improved today after putting ice. Patient not taking any medications for pain. She will go on location someone and wanted to have her hand checked to make sure there is no fractures.   Past Medical History  Diagnosis Date  . Cardiomyopathy     RATE RELATED  . Diverticulosis     DIVERTICULITIS  . C. difficile colitis   . Atrial fibrillation     NEG STRESS CARDIOLITE  . Gilbert's syndrome   . Hyperlipidemia     NMR LIOPROFILE LDL 234(3206/2234)HDL 37,TG86  . Thyroid disease     HYPOTHYROIDISM...THYROID NODULES  . Diverticulitis     PMH of X 2  . Hx of colonic polyp     Past Surgical History  Procedure Date  . Biopsy thyroid     SINGLE NODULE  . Throidectomy 12/08     benign  . Appendectomy 1956  . Cholecystectomy 1972  . Abdominal hysterectomy 1973  . Pacemaker placement     10/2006  . Icd....02/2008   . Breast biopsy 1997  . Rotator cuff repair     x2  . Colonoscopy with polypectomy      X1; negative study 2012  . Cardiac defibrillator placement     Family History  Problem Relation Age of Onset  . Heart attack Mother 30  . Heart attack Brother 65  . Lung disease Father     Silicosis  . Vasculitis Sister     Giant Cell Arteritis  . Ulcerative colitis Son     History  Substance Use Topics  . Smoking  status: Never Smoker   . Smokeless tobacco: Not on file  . Alcohol Use: No    OB History    Grav Para Term Preterm Abortions TAB SAB Ect Mult Living                  Review of Systems  Constitutional:       10 systems reviewed and  pertinent negative and positive symptoms are as per HPI.     Cardiovascular: Negative for chest pain and palpitations.  Musculoskeletal:       As per history of present illness  Neurological: Negative for dizziness and syncope.  All other systems reviewed and are negative.    Allergies  Cephalexin; Erythromycin; Terbinafine hcl; Colchicine; and Rosuvastatin  Home Medications   Current Outpatient Rx  Name Route Sig Dispense Refill  . EZETIMIBE-SIMVASTATIN 10-20 MG PO TABS Oral Take 1 tablet by mouth at bedtime.    Marland Kitchen LEVOTHYROXINE SODIUM 125 MCG PO TABS Oral Take 62.5-125 mcg by mouth daily. TAKE 1/2 TABLET ON MONDAY, WEDNESDAY,FRIDAY, AND SUNDAY, AND 1 TAB ON TUES,THURS,SAT.    Marland Kitchen WARFARIN SODIUM 5 MG PO TABS Oral Take 5 mg by mouth every evening.     Marland Kitchen BIOTIN PO Oral Take 1 tablet by mouth daily as needed. For  hair and nails    . CITRACAL PO Oral Take 1 tablet by mouth daily.    Marland Kitchen VITAMIN D 1000 UNITS PO TABS Oral Take 1,000 Units by mouth daily.    Marland Kitchen COENZYME Q10 30 MG PO CAPS Oral Take 30 mg by mouth daily.    . FUROSEMIDE 20 MG PO TABS Oral Take 20 mg by mouth daily as needed. For fluid    . ADULT MULTIVITAMIN W/MINERALS CH Oral Take 1 tablet by mouth daily.    Marland Kitchen SACCHAROMYCES BOULARDII 250 MG PO CAPS Oral Take 250 mg by mouth daily.        BP 145/85  Pulse 92  Temp 97.9 F (36.6 C) (Oral)  Resp 18  SpO2 98%  Physical Exam  Nursing note and vitals reviewed. Constitutional: She is oriented to person, place, and time. She appears well-developed and well-nourished. No distress.  HENT:  Head: Normocephalic and atraumatic.  Cardiovascular: Normal rate, regular rhythm and normal heart sounds.   Pulmonary/Chest: Effort normal and breath  sounds normal.  Musculoskeletal:       Left hand: No obvious deformity. There is mild erythema and swelling over fourth and fifth metacarpal phalangeal area. Also small ecchymosis in the palmar area over third fourth and fifth metacarpal bones, no fluctuant hematoma area is also tender to palpation. Normal adduction and abduction of the fingers. Patient can make a fist. Left wrist: No deformity full range of motion. No swelling redness or bruising. Left forearm. With no deformity. No focal tenderness no swelling redness or bruising. Entire left upper extremity appears neurovascularly intact.  Neurological: She is alert and oriented to person, place, and time.    ED Course  Procedures (including critical care time)  Labs Reviewed - No data to display Dg Hand Complete Left  09/24/2011  *RADIOLOGY REPORT*  Clinical Data: Larey Seat yesterday with pain in the second and third MCP joints  LEFT HAND - COMPLETE 3+ VIEW  Comparison: None.  Findings: There is some loss of radiocarpal joint space and there is a probable subchondral cyst within the lunate consistent with degenerative change.  The MCP and PIP joints appear relatively normal, with degenerative change involving the DIP joints diffusely.  No acute fracture is seen.  IMPRESSION: No acute fracture.  Degenerative change of the wrist and involving the DIP joints.  Original Report Authenticated By: Juline Patch, M.D.     1. Sprain of left hand or finger   2. Contusion of hand, left       MDM  No fractures on x-rays. Impress fourth and fifth digit sprain and contusion. Placed on a splint with volar support. Rehabilitation exercises discussed. Over-the-counter Tylenol every 6-8 hours as needed for pain. Orthopedic followup as needed.        Sharin Grave, MD 09/25/11 1100

## 2011-09-24 NOTE — ED Notes (Signed)
States she slipped getting out of tub 8-6, and since then, has had "dreadful " pain in hand , esp in palmar aspect and MP joints of index and middle fingers; NAD at present

## 2011-10-01 ENCOUNTER — Ambulatory Visit (INDEPENDENT_AMBULATORY_CARE_PROVIDER_SITE_OTHER): Payer: Medicare Other | Admitting: Pharmacist

## 2011-10-01 DIAGNOSIS — I4891 Unspecified atrial fibrillation: Secondary | ICD-10-CM

## 2011-10-01 LAB — POCT INR: INR: 2.2

## 2011-10-10 ENCOUNTER — Telehealth: Payer: Self-pay | Admitting: Internal Medicine

## 2011-10-10 NOTE — Telephone Encounter (Addendum)
New msg Pt's husband wants discuss her device please call him back

## 2011-10-13 ENCOUNTER — Telehealth: Payer: Self-pay | Admitting: Internal Medicine

## 2011-10-13 NOTE — Telephone Encounter (Signed)
Pt's husband calling re number to [employer] and wants a call tomorrow from [employer]

## 2011-10-14 NOTE — Telephone Encounter (Signed)
Spoke with patient's husband.  He is concerned because they have not heard anything back yet from [employer] regarding testing that was being done on explanted device.  I will follow up with [employer] and get back in touch with patient's husband.

## 2011-10-17 NOTE — Telephone Encounter (Signed)
[employer] has received explanted device.  It has not yet been evaluated.  [employer] will send report when device evaluation has been completed. Husband aware.

## 2011-11-04 ENCOUNTER — Other Ambulatory Visit: Payer: Medicare Other

## 2011-11-04 ENCOUNTER — Other Ambulatory Visit (INDEPENDENT_AMBULATORY_CARE_PROVIDER_SITE_OTHER): Payer: Medicare Other

## 2011-11-04 DIAGNOSIS — E785 Hyperlipidemia, unspecified: Secondary | ICD-10-CM

## 2011-11-04 DIAGNOSIS — T887XXA Unspecified adverse effect of drug or medicament, initial encounter: Secondary | ICD-10-CM

## 2011-11-04 LAB — HEPATIC FUNCTION PANEL
Albumin: 4 g/dL (ref 3.5–5.2)
Alkaline Phosphatase: 61 U/L (ref 39–117)
Bilirubin, Direct: 0.2 mg/dL (ref 0.0–0.3)
Total Bilirubin: 2.2 mg/dL — ABNORMAL HIGH (ref 0.3–1.2)

## 2011-11-04 LAB — LIPID PANEL
LDL Cholesterol: 74 mg/dL (ref 0–99)
Total CHOL/HDL Ratio: 4

## 2011-11-12 ENCOUNTER — Ambulatory Visit (INDEPENDENT_AMBULATORY_CARE_PROVIDER_SITE_OTHER): Payer: Medicare Other | Admitting: *Deleted

## 2011-11-12 DIAGNOSIS — I4891 Unspecified atrial fibrillation: Secondary | ICD-10-CM

## 2011-11-19 ENCOUNTER — Ambulatory Visit: Payer: Medicare Other

## 2011-11-19 ENCOUNTER — Ambulatory Visit (INDEPENDENT_AMBULATORY_CARE_PROVIDER_SITE_OTHER): Payer: Medicare Other | Admitting: Internal Medicine

## 2011-11-19 DIAGNOSIS — Z23 Encounter for immunization: Secondary | ICD-10-CM

## 2011-11-19 DIAGNOSIS — E785 Hyperlipidemia, unspecified: Secondary | ICD-10-CM

## 2011-11-19 MED ORDER — EZETIMIBE-SIMVASTATIN 10-40 MG PO TABS: ORAL_TABLET | ORAL | Status: DC

## 2011-11-19 NOTE — Progress Notes (Signed)
  Subjective:    Patient ID: Caci Orren, female    DOB: 03/04/1929, 78 y.o.   MRN: [id number]  HPI Dyslipidemia assessment: Prior Advanced Lipid Testing: NMR Lipoprofile LDL goal = < 100.   Family history of premature CAD/ MI: mother stroke & bro but premature .  Nutrition: .  Exercise: not since 08/21/11 when ICD replaced . Diabetes : no . HTN:no. Smoking history  :never .     Review of Systems Weight :  Down with decreased baked goods.  fatigue: no ; chest pain :no ;claudication: no; palpitations: no; abd pain/bowel changes: no ; myalgias:no;  syncope : no ; memory loss: no;skin changes: no.       Objective:   Physical Exam She appears healthy and well-nourished;she is in no acute distress.Appears younger than stated age   No carotid bruits are present.  Heart rhythm and rate are normal with no significant murmurs or gallops.S4  Chest is clear with no increased work of breathing  There is no evidence of aortic aneurysm or renal artery bruits  She has no clubbing or edema.   Pedal pulses are intact but slightly decreased  No ischemic skin changes are present         Assessment & Plan:

## 2011-11-19 NOTE — Assessment & Plan Note (Signed)
Her LDL related risk has dropped 160%. She states that taking one half pill of Vytorin 10/40 would save substantial amounts of money; therefore, dosage will be changed.

## 2011-11-19 NOTE — Patient Instructions (Addendum)
Preventive Health Care: Exercise  30-45  minutes a day, 3-4 days a week. Walking is especially valuable in preventing Osteoporosis. Eat a low-fat diet with lots of fruits and vegetables, up to 7-9 servings per day.Consume less than 30 grams of sugar per day from foods & drinks with High Fructose Corn Syrup as # 1,2,3 or #4 on label.  

## 2011-12-04 ENCOUNTER — Ambulatory Visit (INDEPENDENT_AMBULATORY_CARE_PROVIDER_SITE_OTHER): Payer: Medicare Other | Admitting: Family Medicine

## 2011-12-04 ENCOUNTER — Encounter: Payer: Self-pay | Admitting: Family Medicine

## 2011-12-04 VITALS — BP 124/80 | HR 87 | Temp 97.7°F | Ht 64.0 in | Wt 162.8 lb

## 2011-12-04 DIAGNOSIS — J069 Acute upper respiratory infection, unspecified: Secondary | ICD-10-CM

## 2011-12-04 DIAGNOSIS — J029 Acute pharyngitis, unspecified: Secondary | ICD-10-CM

## 2011-12-04 NOTE — Patient Instructions (Addendum)
This all appears to be viral and should improve w/ time Drink plenty of fluids REST! Treat the symptoms as they arise- cough syrup, nasal decongestants, tylenol/ibuprofen Call with any questions or concerns- particularly if not improving in 7 days Hang in there!

## 2011-12-04 NOTE — Assessment & Plan Note (Signed)
Pt's sxs appear to viral.  No evidence of bacterial infxn.  Reviewed supportive care and red flags that should prompt return.  Pt expressed understanding and is in agreement w/ plan.

## 2011-12-04 NOTE — Progress Notes (Signed)
  Subjective:    Patient ID: Michele [name], female    DOB: April 23, 1929, 78 y.o.   MRN: [id number]  HPI Sore throat- sxs started Tuesday night when she returned from vacation.  Initially was painful to swallow.  Now able to eat and drink.  + nasal congestion, no current ear pain.  No facial pain/pressure.  Mild cough- initially productive but now dry.  No fevers but + chills.  No known sick contacts but flew on multiple planes.   Review of Systems For ROS see HPI     Objective:   Physical Exam  Constitutional: She appears well-developed and well-nourished. No distress.  HENT:  Head: Normocephalic and atraumatic.  Right Ear: Tympanic membrane normal.  Left Ear: Tympanic membrane normal.  Nose: Mucosal edema and rhinorrhea present. Right sinus exhibits no maxillary sinus tenderness and no frontal sinus tenderness. Left sinus exhibits no maxillary sinus tenderness and no frontal sinus tenderness.  Mouth/Throat: Mucous membranes are normal. Posterior oropharyngeal erythema (w/ PND) present.  Eyes: Conjunctivae normal and EOM are normal. Pupils are equal, round, and reactive to light.  Neck: Normal range of motion. Neck supple.  Cardiovascular: Normal rate, regular rhythm and normal heart sounds.   Pulmonary/Chest: Effort normal and breath sounds normal. No respiratory distress. She has no wheezes. She has no rales.  Lymphadenopathy:    She has no cervical adenopathy.          Assessment & Plan:

## 2011-12-10 ENCOUNTER — Ambulatory Visit (INDEPENDENT_AMBULATORY_CARE_PROVIDER_SITE_OTHER): Payer: Medicare Other

## 2011-12-10 VITALS — BP 104/62 | HR 90 | Temp 97.8°F | Wt 163.4 lb

## 2011-12-10 DIAGNOSIS — I4891 Unspecified atrial fibrillation: Secondary | ICD-10-CM

## 2011-12-10 LAB — POCT INR: INR: 2.6

## 2011-12-10 NOTE — Patient Instructions (Addendum)
INR was 2.6 today which was great. You will continue to take 5 mg of Coumadin daily and recheck levels in one month or sooner if needed.        KP

## 2011-12-12 ENCOUNTER — Other Ambulatory Visit: Payer: Self-pay | Admitting: *Deleted

## 2011-12-12 MED ORDER — FUROSEMIDE 20 MG PO TABS: 20.0000 mg | ORAL_TABLET | Freq: Every day | ORAL | Status: DC | PRN

## 2011-12-16 ENCOUNTER — Encounter: Payer: Self-pay | Admitting: Internal Medicine

## 2011-12-16 ENCOUNTER — Ambulatory Visit (INDEPENDENT_AMBULATORY_CARE_PROVIDER_SITE_OTHER): Payer: Medicare Other | Admitting: Internal Medicine

## 2011-12-16 VITALS — BP 118/66 | HR 85 | Ht 64.0 in | Wt 162.4 lb

## 2011-12-16 DIAGNOSIS — I4891 Unspecified atrial fibrillation: Secondary | ICD-10-CM

## 2011-12-16 DIAGNOSIS — R079 Chest pain, unspecified: Secondary | ICD-10-CM

## 2011-12-16 DIAGNOSIS — Z95810 Presence of automatic (implantable) cardiac defibrillator: Secondary | ICD-10-CM

## 2011-12-16 DIAGNOSIS — I429 Cardiomyopathy, unspecified: Secondary | ICD-10-CM

## 2011-12-16 LAB — ICD DEVICE OBSERVATION
AL AMPLITUDE: 0.875 mv
ATRIAL PACING ICD: 0 pct
LV LEAD IMPEDENCE ICD: 513 Ohm
RV LEAD IMPEDENCE ICD: 570 Ohm
RV LEAD THRESHOLD: 0.75 V
TOT-0001: 0
TOT-0006: 20130704000000
TZAT-0001ATACH: 2
TZAT-0001FASTVT: 1
TZAT-0004FASTVT: 8
TZAT-0004SLOWVT: 8
TZAT-0004SLOWVT: 8
TZAT-0005FASTVT: 88 pct
TZAT-0011SLOWVT: 10 ms
TZAT-0012ATACH: 150 ms
TZAT-0012ATACH: 150 ms
TZAT-0012SLOWVT: 170 ms
TZAT-0012SLOWVT: 170 ms
TZAT-0018ATACH: NEGATIVE
TZAT-0019ATACH: 6 V
TZAT-0020ATACH: 1.5 ms
TZAT-0020ATACH: 1.5 ms
TZAT-0020SLOWVT: 1.5 ms
TZAT-0020SLOWVT: 1.5 ms
TZON-0003ATACH: 350 ms
TZON-0003SLOWVT: 360 ms
TZON-0003VSLOWVT: 450 ms
TZON-0004VSLOWVT: 20
TZST-0001ATACH: 4
TZST-0001ATACH: 6
TZST-0001FASTVT: 2
TZST-0001FASTVT: 5
TZST-0001SLOWVT: 5
TZST-0003FASTVT: 35 J
TZST-0003FASTVT: 35 J
TZST-0003SLOWVT: 15 J
TZST-0003SLOWVT: 35 J
VF: 0

## 2011-12-16 NOTE — Assessment & Plan Note (Signed)
The patient's device was interrogated.  The information was reviewed. No changes were made in the programming.    

## 2011-12-16 NOTE — Assessment & Plan Note (Signed)
The patient has pain along the incision. There is a small keloid here. I will discuss with plastic surgery whether they think that there is any potential value to steroid injection of the incision line. The other concern is pain related to the [employer]eg[employer] antimicrobial pouch. It had another lady required actually explantation of her device because of the discomfort. She to have no evidence of overlying inflammation or infection. I suggested to Mrs. pain that she try a course of nonsteroidals.

## 2011-12-16 NOTE — Assessment & Plan Note (Signed)
Stable. Improved left ventricular function

## 2011-12-16 NOTE — Assessment & Plan Note (Signed)
Permanent on anticoagulation 

## 2011-12-16 NOTE — Progress Notes (Signed)
Patient Care Team: Pecola Lawless, MD as PCP - General   HPI  Michele [name] is a 78 y.o. female is seen in followup for atrial fibrillation s/p AV ablation with A. 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     July 13 she developed "thumping". Interrogation of her device demonstrated normal his on the RV lead associated with ventricular pacing in addition in  device dependent lady. Her previously implanted defibrillator lead at that rate sense portion cath because of diaphragmatic stimulation. The subsequently implanted rate sense lead was; she received a new device at that same time because of concerns of the cause and origin of the sensing issue.  She has done quite well. She does however have pain at her device site. Initially I thought was more global around the device to more specific questioning it seems to be directly related to the incision itself   Past Medical History  Diagnosis Date  . Cardiomyopathy     RATE RELATED  . Diverticulosis     DIVERTICULITIS  . C. difficile colitis   . Atrial fibrillation     NEG STRESS CARDIOLITE  . Gilbert's syndrome   . Hyperlipidemia     NMR LIOPROFILE LDL 234(3206/2234)HDL 37,TG86  . Thyroid disease     HYPOTHYROIDISM...THYROID NODULES  . Diverticulitis     PMH of X 2  . Hx of colonic polyp     Past Surgical History  Procedure Date  . Biopsy thyroid     SINGLE NODULE  . Throidectomy 12/08     benign  . Appendectomy 1956  . Cholecystectomy 1972  . Abdominal hysterectomy 1973  . Pacemaker placement     10/2006  . Icd....02/2008   . Breast biopsy 1997  . Rotator cuff repair     x2  . Colonoscopy with polypectomy      X1; negative study 2012  . Cardiac defibrillator placement     Current Outpatient Prescriptions  Medication Sig Dispense Refill  . Calcium Citrate (CITRACAL PO) Take 1 tablet by mouth daily.      .  cholecalciferol (VITAMIN D) 1000 UNITS tablet Take 1,000 Units by mouth daily.      Marland Kitchen co-enzyme Q-10 30 MG capsule Take 30 mg by mouth daily.      Marland Kitchen ezetimibe-simvastatin (VYTORIN) 10-40 MG per tablet 1/2 qhs  90 tablet  1  . furosemide (LASIX) 20 MG tablet Take 1 tablet (20 mg total) by mouth daily as needed. For fluid  30 tablet  3  . levothyroxine (SYNTHROID, LEVOTHROID) 125 MCG tablet Take 62.5-125 mcg by mouth daily. TAKE 1/2 TABLET ON MONDAY, WEDNESDAY,FRIDAY, AND SUNDAY, AND 1 TAB ON TUES,THURS,SAT.      . Multiple Vitamin (MULTIVITAMIN WITH MINERALS) TABS Take 1 tablet by mouth daily.      Marland Kitchen saccharomyces boulardii (FLORASTOR) 250 MG capsule Take 250 mg by mouth daily.        Marland Kitchen warfarin (COUMADIN) 5 MG tablet Take 5 mg by mouth every evening.         Allergies  Allergen Reactions  . Cephalexin Hives  . Erythromycin Swelling and Other (See Comments)    REACTION: swollen and sore mouth  . Terbinafine Hcl     Rash   . Colchicine Other (See Comments)    REACTION: violent diarrhea  . Rosuvastatin Other (See Comments)    REACTION: upper arm pain bilaterally    Review of Systems negative except  from HPI and PMH  Physical Exam BP 118/66  Pulse 85  Ht 5\' 4"  (1.626 m)  Wt 162 lb 6.4 oz (73.664 kg)  BMI 27.88 kg/m2  SpO2 97% Well developed and well nourished in no acute distress HENT normal E scleral and icterus clear Neck Supple JVP flat; carotids brisk and full Clear to ausculation Device pocket well healed; without hematoma or erythema; which there is a small keloid along the incision which is quite tender to touch  Regular rate and rhythm, no murmurs gallops or rub Soft with active bowel sounds No clubbing cyanosis none Edema Alert and oriented, grossly normal motor and sensory function Skin Warm and Dry    Assessment and  Plan

## 2011-12-25 ENCOUNTER — Telehealth: Payer: Self-pay | Admitting: Internal Medicine

## 2011-12-25 MED ORDER — WARFARIN SODIUM 5 MG PO TABS: 5.0000 mg | ORAL_TABLET | Freq: Every evening | ORAL | Status: DC

## 2011-12-25 NOTE — Telephone Encounter (Signed)
Rx sent 

## 2011-12-25 NOTE — Telephone Encounter (Signed)
Refill: Coumadin 5 mg tablet. Take 1 tablet as directed. Qty 90. Last fill 8.5.13

## 2012-01-03 ENCOUNTER — Other Ambulatory Visit: Payer: Self-pay | Admitting: Internal Medicine

## 2012-01-05 NOTE — Telephone Encounter (Signed)
Rx sent.    MW 

## 2012-01-12 ENCOUNTER — Ambulatory Visit (INDEPENDENT_AMBULATORY_CARE_PROVIDER_SITE_OTHER): Payer: Medicare Other

## 2012-01-12 VITALS — BP 110/68 | HR 82 | Wt 162.0 lb

## 2012-01-12 DIAGNOSIS — Z7901 Long term (current) use of anticoagulants: Secondary | ICD-10-CM

## 2012-01-12 DIAGNOSIS — I4891 Unspecified atrial fibrillation: Secondary | ICD-10-CM

## 2012-01-12 LAB — POCT INR: INR: 3.6

## 2012-01-12 NOTE — Patient Instructions (Signed)
Current Regimen: 5 mg daily, no missed doses. Diet change: Green Beans 2-3 x New Regimen: 2.5 mg today, then resume regular schedule and recheck level in 4 weeks (Per MD)

## 2012-01-17 ENCOUNTER — Other Ambulatory Visit: Payer: Self-pay | Admitting: Internal Medicine

## 2012-01-19 NOTE — Telephone Encounter (Signed)
Rx printed and faxed.   MW  

## 2012-02-06 ENCOUNTER — Ambulatory Visit (INDEPENDENT_AMBULATORY_CARE_PROVIDER_SITE_OTHER): Payer: Medicare Other | Admitting: *Deleted

## 2012-02-06 DIAGNOSIS — Z7901 Long term (current) use of anticoagulants: Secondary | ICD-10-CM

## 2012-02-06 DIAGNOSIS — I4891 Unspecified atrial fibrillation: Secondary | ICD-10-CM

## 2012-02-06 LAB — POCT INR: INR: 4.8

## 2012-02-06 NOTE — Patient Instructions (Addendum)
Return to office in 2 weeks  New dosing: Hold today then 1 tab daily except 1/2 tab tues,sat

## 2012-02-20 ENCOUNTER — Ambulatory Visit (INDEPENDENT_AMBULATORY_CARE_PROVIDER_SITE_OTHER): Payer: Medicare Other | Admitting: *Deleted

## 2012-02-20 VITALS — BP 100/72 | HR 92 | Wt 167.0 lb

## 2012-02-20 DIAGNOSIS — I4891 Unspecified atrial fibrillation: Secondary | ICD-10-CM

## 2012-02-20 DIAGNOSIS — Z7901 Long term (current) use of anticoagulants: Secondary | ICD-10-CM

## 2012-02-20 LAB — POCT INR: INR: 2.2

## 2012-02-20 NOTE — Patient Instructions (Signed)
Return to office in 4 weeks  New dosing:1 tab on tomorrow then resume regular regimen 1 tab daily except 1/2 tab Saturday and Tuesday

## 2012-03-19 ENCOUNTER — Ambulatory Visit (INDEPENDENT_AMBULATORY_CARE_PROVIDER_SITE_OTHER): Payer: Medicare Other | Admitting: Internal Medicine

## 2012-03-19 ENCOUNTER — Ambulatory Visit: Payer: Medicare Other

## 2012-03-19 ENCOUNTER — Other Ambulatory Visit: Payer: Medicare Other

## 2012-03-19 DIAGNOSIS — Z7901 Long term (current) use of anticoagulants: Secondary | ICD-10-CM

## 2012-03-19 DIAGNOSIS — I4891 Unspecified atrial fibrillation: Secondary | ICD-10-CM

## 2012-03-19 LAB — PROTIME-INR: INR: 1.59 — ABNORMAL HIGH (ref <1.50)

## 2012-03-20 ENCOUNTER — Telehealth: Payer: Self-pay

## 2012-03-20 NOTE — Telephone Encounter (Signed)
Message copied by Maurice Small on Sat Mar 20, 2012  9:12 AM ------      Message from: Pecola Lawless      Created: Sat Mar 20, 2012  8:44 AM       PT/INR of 1.59  is NOT  therapeutic. Change warfarin dose to 1&1/2 pills Sat 03/20/12 & ONE pill ALL other days (now on 1/2 Tues & Sat); repeat PT/INR in 3 weeks

## 2012-03-20 NOTE — Telephone Encounter (Signed)
Left message on voicemail with instruction. Per Dr.Hopper, Mychart message also sent with instructions. Patient instructed to call the office on Monday if she has questions or concerns

## 2012-03-20 NOTE — Progress Notes (Signed)
  Subjective:    Patient ID: Michele [name], female    DOB: 11/21/1929, 79 y.o.   MRN: [id number]  HPI PT supplies not adequate ; sent to Lab for PT/INR    Review of Systems     Objective:   Physical Exam        Assessment & Plan:

## 2012-03-22 ENCOUNTER — Ambulatory Visit (INDEPENDENT_AMBULATORY_CARE_PROVIDER_SITE_OTHER): Payer: Medicare Other | Admitting: *Deleted

## 2012-03-22 DIAGNOSIS — I429 Cardiomyopathy, unspecified: Secondary | ICD-10-CM

## 2012-03-22 DIAGNOSIS — I509 Heart failure, unspecified: Secondary | ICD-10-CM

## 2012-03-22 DIAGNOSIS — Z95810 Presence of automatic (implantable) cardiac defibrillator: Secondary | ICD-10-CM

## 2012-03-22 LAB — REMOTE ICD DEVICE
ATRIAL PACING ICD: 0 pct
BATTERY VOLTAGE: 3.1487 V
LV LEAD IMPEDENCE ICD: 456 Ohm
LV LEAD THRESHOLD: 1.25 V
PACEART VT: 0
TOT-0006: 20130704000000
TZAT-0001FASTVT: 1
TZAT-0002ATACH: NEGATIVE
TZAT-0004FASTVT: 8
TZAT-0011SLOWVT: 10 ms
TZAT-0011SLOWVT: 10 ms
TZAT-0012ATACH: 150 ms
TZAT-0012FASTVT: 170 ms
TZAT-0012SLOWVT: 170 ms
TZAT-0012SLOWVT: 170 ms
TZAT-0013FASTVT: 2
TZAT-0018FASTVT: NEGATIVE
TZAT-0018SLOWVT: NEGATIVE
TZAT-0018SLOWVT: NEGATIVE
TZAT-0019ATACH: 6 V
TZAT-0019ATACH: 6 V
TZAT-0019FASTVT: 8 V
TZAT-0019SLOWVT: 8 V
TZAT-0019SLOWVT: 8 V
TZAT-0020ATACH: 1.5 ms
TZAT-0020ATACH: 1.5 ms
TZAT-0020FASTVT: 1.5 ms
TZAT-0020SLOWVT: 1.5 ms
TZAT-0020SLOWVT: 1.5 ms
TZON-0003FASTVT: 210 ms
TZON-0003SLOWVT: 360 ms
TZON-0005SLOWVT: 12
TZST-0001ATACH: 4
TZST-0001ATACH: 5
TZST-0001FASTVT: 2
TZST-0001FASTVT: 4
TZST-0001SLOWVT: 4
TZST-0002ATACH: NEGATIVE
TZST-0003FASTVT: 35 J
TZST-0003FASTVT: 35 J
TZST-0003FASTVT: 35 J
TZST-0003SLOWVT: 35 J
TZST-0003SLOWVT: 35 J
VENTRICULAR PACING ICD: 99.28 pct
VF: 0

## 2012-03-30 ENCOUNTER — Telehealth: Payer: Self-pay | Admitting: Internal Medicine

## 2012-03-30 DIAGNOSIS — E039 Hypothyroidism, unspecified: Secondary | ICD-10-CM

## 2012-03-30 MED ORDER — SYNTHROID 125 MCG PO TABS: ORAL_TABLET | ORAL | Status: DC

## 2012-03-30 NOTE — Telephone Encounter (Signed)
Future order placed 

## 2012-03-30 NOTE — Telephone Encounter (Signed)
refill SYNTHROID 125 MCG TAKE 1/2 TABLET ON MONDAY, WEDNESDAY,FRIDAY, AND SUNDAY, AND 1 TAB ONTUES,THURS,SAT. # 30 last fill 1.13.14

## 2012-04-05 ENCOUNTER — Telehealth: Payer: Self-pay | Admitting: Internal Medicine

## 2012-04-05 MED ORDER — WARFARIN SODIUM 5 MG PO TABS: 5.0000 mg | ORAL_TABLET | Freq: Every evening | ORAL | Status: DC

## 2012-04-05 NOTE — Telephone Encounter (Signed)
Rx sent 

## 2012-04-05 NOTE — Telephone Encounter (Signed)
Refill: Coumadin 5 mg tablet. Take 1 tablet in the pm. Qty 90. Last fill 11.7.13

## 2012-04-09 ENCOUNTER — Ambulatory Visit (INDEPENDENT_AMBULATORY_CARE_PROVIDER_SITE_OTHER): Payer: Medicare Other | Admitting: *Deleted

## 2012-04-09 VITALS — BP 100/62 | HR 87 | Wt 167.0 lb

## 2012-04-09 DIAGNOSIS — I4891 Unspecified atrial fibrillation: Secondary | ICD-10-CM

## 2012-04-09 DIAGNOSIS — Z7901 Long term (current) use of anticoagulants: Secondary | ICD-10-CM

## 2012-04-09 LAB — POCT INR: INR: 2.9

## 2012-04-09 NOTE — Patient Instructions (Signed)
Return to office in 4 weeks  New dosing: Take 2.5 mg today then resume 5 mg daily

## 2012-04-13 ENCOUNTER — Encounter: Payer: Self-pay | Admitting: *Deleted

## 2012-04-16 ENCOUNTER — Encounter: Payer: Self-pay | Admitting: Internal Medicine

## 2012-04-21 ENCOUNTER — Encounter: Payer: Self-pay | Admitting: Internal Medicine

## 2012-04-21 ENCOUNTER — Ambulatory Visit (INDEPENDENT_AMBULATORY_CARE_PROVIDER_SITE_OTHER): Payer: Medicare Other | Admitting: Internal Medicine

## 2012-04-21 VITALS — BP 130/78 | HR 86 | Temp 97.9°F | Wt 168.0 lb

## 2012-04-21 DIAGNOSIS — H01006 Unspecified blepharitis left eye, unspecified eyelid: Secondary | ICD-10-CM

## 2012-04-21 DIAGNOSIS — H01009 Unspecified blepharitis unspecified eye, unspecified eyelid: Secondary | ICD-10-CM

## 2012-04-21 MED ORDER — CIPROFLOXACIN HCL 0.3 % OP OINT: TOPICAL_OINTMENT | Freq: Three times a day (TID) | OPHTHALMIC | Status: DC

## 2012-04-21 MED ORDER — CIPROFLOXACIN HCL 0.3 % OP OINT: TOPICAL_OINTMENT | OPHTHALMIC | Status: DC

## 2012-04-21 NOTE — Patient Instructions (Addendum)
Use the ophthalmologic ointment as directed. Strict hand washing before and after its application. 

## 2012-04-21 NOTE — Progress Notes (Signed)
  Subjective:    Patient ID: Michele [name], female    DOB: February 01, 1930, 79 y.o.   MRN: [id number]  HPI Skin lesions began as "bumps", redness & tenderness 2 weeks ago on OS upper lid > lower lid.Eye is crusty in am; no visualized purulent  discharge Lesions were localized to  face.  The dermatologic lesions have remained stable . Treatment with Tears Two & warm compresses was transiently effective.  There was no associated  exposure to individuals with conjunctivitis.  PT/INR 2.5 two weeks ago.           Review of Systems Constitutional: no fever ; chills; change in weight ; excessive fatigue Allergy/immunologic: Some sneezing.No rhinitis ; angioedema ; itchy eyes ; hives Eyes: no blurred/double/loss of vision  Ears, nose and throat/mouth: no nasal congestion ; sore throat ; earache ;  facial pain ;frontal headache  Respirations:no cough ; sputum  ; dyspnea; wheezing GU: no dysuria ; discharge  Musculoskeletal: no joint swelling or redness  Hematologic/lymphatic:no enlarged lymph nodes     Objective:   Physical Exam General appearance:good health ;well nourished; no acute distress or increased work of breathing is present.  No  lymphadenopathy about the head, neck, or axilla noted.   Eyes: No conjunctival inflammation ; OS upper lid erythematous & tender. EOMI ; vision grossly normal  Ears:  External ear exam shows no significant lesions or deformities.  Otoscopic examination reveals clear canals, tympanic membranes are intact bilaterally without bulging, retraction, inflammation or discharge.  Nose:  External nasal examination shows no deformity or inflammation. Nasal mucosa are pink and moist without lesions or exudates. Septal dislocation to L   Oral exam: Dental hygiene is good; lips and gums are healthy appearing.There is no oropharyngeal erythema or exudate noted.   Neck:  No deformities,  masses, or tenderness noted.   Supple with full range of motion without pain.    Skin: Warm & dry (see eye).         Assessment & Plan:

## 2012-05-06 ENCOUNTER — Ambulatory Visit (INDEPENDENT_AMBULATORY_CARE_PROVIDER_SITE_OTHER): Payer: Medicare Other | Admitting: *Deleted

## 2012-05-06 VITALS — BP 112/70 | HR 77

## 2012-05-06 DIAGNOSIS — I4891 Unspecified atrial fibrillation: Secondary | ICD-10-CM

## 2012-05-06 DIAGNOSIS — Z7901 Long term (current) use of anticoagulants: Secondary | ICD-10-CM

## 2012-05-06 NOTE — Patient Instructions (Signed)
Take 2.5mg  today then resume 5mg  daily.  Return to the office in 4 weeks.

## 2012-05-07 ENCOUNTER — Ambulatory Visit: Payer: Medicare Other

## 2012-05-12 ENCOUNTER — Other Ambulatory Visit: Payer: Self-pay | Admitting: Internal Medicine

## 2012-06-01 ENCOUNTER — Ambulatory Visit (INDEPENDENT_AMBULATORY_CARE_PROVIDER_SITE_OTHER): Payer: Medicare Other | Admitting: *Deleted

## 2012-06-01 VITALS — BP 112/70 | HR 85

## 2012-06-01 DIAGNOSIS — I4891 Unspecified atrial fibrillation: Secondary | ICD-10-CM

## 2012-06-01 DIAGNOSIS — E039 Hypothyroidism, unspecified: Secondary | ICD-10-CM

## 2012-06-01 DIAGNOSIS — Z7901 Long term (current) use of anticoagulants: Secondary | ICD-10-CM

## 2012-06-01 LAB — POCT INR: INR: 2.8

## 2012-06-01 NOTE — Patient Instructions (Addendum)
Take 2.5mg  today & continue current dose of 5mg  daily. Return to the office in 4 weeks.

## 2012-06-03 ENCOUNTER — Ambulatory Visit: Payer: Medicare Other

## 2012-06-11 ENCOUNTER — Telehealth: Payer: Self-pay | Admitting: Internal Medicine

## 2012-06-11 MED ORDER — SYNTHROID 125 MCG PO TABS: ORAL_TABLET | ORAL | Status: DC

## 2012-06-11 NOTE — Telephone Encounter (Signed)
Synthroid can be refilled #30, but she should decrease the dose to half a pill a day until seen. Her TSH was too low and the dose will have to be adjusted. This should be discussed in person

## 2012-06-11 NOTE — Telephone Encounter (Signed)
Patient called in stating that she was told that she needed labs before further refills were given. I explained this to her but she states that she just had labs done on 06/01/12. Does patient need additional labs or does she need to see the doctor also? Thanks!

## 2012-06-11 NOTE — Telephone Encounter (Signed)
Pt called back today in regards to getting her synthroid refilled. Per latest TSH lab pt needs to come in for an appt with her medications before she can get refills. Pt has an appt on 06/16/12. Still want me to hold filling until then?

## 2012-06-11 NOTE — Telephone Encounter (Signed)
Pt notified and med filled.  

## 2012-06-16 ENCOUNTER — Ambulatory Visit (INDEPENDENT_AMBULATORY_CARE_PROVIDER_SITE_OTHER): Payer: Medicare Other | Admitting: Internal Medicine

## 2012-06-16 ENCOUNTER — Encounter: Payer: Self-pay | Admitting: Internal Medicine

## 2012-06-16 VITALS — BP 122/74 | HR 84 | Wt 163.0 lb

## 2012-06-16 DIAGNOSIS — E039 Hypothyroidism, unspecified: Secondary | ICD-10-CM

## 2012-06-16 DIAGNOSIS — E785 Hyperlipidemia, unspecified: Secondary | ICD-10-CM

## 2012-06-16 MED ORDER — SYNTHROID 125 MCG PO TABS: ORAL_TABLET | ORAL | Status: DC

## 2012-06-16 NOTE — Patient Instructions (Addendum)
Please  schedule fasting Labs in 10-12 : CK,Lipids, hepatic panel,  TSH.PLEASE BRING THESE INSTRUCTIONS TO FOLLOW UP  LAB APPOINTMENT.This will guarantee correct labs are drawn, eliminating need for repeat blood sampling ( needle sticks ! ). Diagnoses /Codes: 244.9,272.4,995.20

## 2012-06-16 NOTE — Assessment & Plan Note (Signed)
Fasting lipids can be rechecked with the TSH in 10-12 weeks.

## 2012-06-16 NOTE — Progress Notes (Signed)
  Subjective:    Patient ID: Michele [name], female    DOB: 01-Oct-1929, 79 y.o.   MRN: [id number]  HPI   Despite no change in the brand, dose, or mode of administration her TSH was 0.29 on 06/01/12. She was instructed to decrease the dose from one half pill Monday, Wednesday, Friday, and Sunday and one pill on Tuesday, Thursday, and Saturday to a dose of one half pill daily.  Despite the TSH is low she has actually gained some weight. She attributes this to possibly a decrease in her exercise.    Review of Systems She has noted some hair loss without other associated skin or nail change. She has alternating constipation and diarrhea despite Metamucil. She has not noted palpitations but has a fixed rate pacer. Temperature intolerance to heat.  She inquired about her lipids. These were last checked in September 2013. They were excellent except for a mildly reduced HDL. Her cardiovascular risk had dropped almost 45%.     Objective:   Physical Exam   Gen.:  well-nourished; in no acute distress.Appears younger than stated age Eyes: Extraocular motion intact; no lid lag or proptosis ,nystagmus Neck:  no masses ; thyroid absent Heart: Normal rhythm and rate without significant murmur, gallop, or extra heart sounds Lungs: Chest clear to auscultation without rales,rales, wheezes Neuro:Deep tendon reflexes are equal and within normal limits; no tremor  Skin: Warm and dry without significant lesions or rashes; no onycholysis Lymphatic: no cervical or axillary LA Psych: Normally communicative and interactive; no abnormal mood or affect clinically.       Assessment & Plan:

## 2012-06-16 NOTE — Assessment & Plan Note (Signed)
TSH should be checked after 10-12 weeks of the dose adjustment.

## 2012-06-17 ENCOUNTER — Telehealth: Payer: Self-pay | Admitting: Internal Medicine

## 2012-06-17 NOTE — Telephone Encounter (Signed)
Caller: Michele [name]/Patient; Phone: [phone]; Reason for Call: Patient takes Vytorin at this time.  When in the office yesterday, samples of the medication where given that are a different dosage than what she normally takes.  Patient wants to know if she needs to stop taking the previous dose and begin the new dose with the sample medication?

## 2012-06-17 NOTE — Telephone Encounter (Signed)
Left Pt detail VM samples of 10-20 placed up front and not to take the 10/40. Pt to return call with any additional concerns.

## 2012-06-17 NOTE — Telephone Encounter (Signed)
I apologize, I thought I saw 10/40 on one of the forms. Please pick up to 6 packets of the 10/20.

## 2012-06-17 NOTE — Telephone Encounter (Signed)
Spoke with patient, patient was given samples of 10/40 and she was previously taking 10/20 of vytorin as listed on her medication list. Patient does not recall being told to increase medication, please advise

## 2012-06-21 ENCOUNTER — Ambulatory Visit (INDEPENDENT_AMBULATORY_CARE_PROVIDER_SITE_OTHER): Payer: Medicare Other | Admitting: *Deleted

## 2012-06-21 ENCOUNTER — Encounter: Payer: Self-pay | Admitting: Internal Medicine

## 2012-06-21 DIAGNOSIS — I429 Cardiomyopathy, unspecified: Secondary | ICD-10-CM

## 2012-06-21 DIAGNOSIS — Z95810 Presence of automatic (implantable) cardiac defibrillator: Secondary | ICD-10-CM

## 2012-07-05 LAB — REMOTE ICD DEVICE
AL AMPLITUDE: 1.8 mv
AL IMPEDENCE ICD: 456 Ohm
BATTERY VOLTAGE: 3.1321 V
LV LEAD THRESHOLD: 1.25 V
PACEART VT: 0
RV LEAD IMPEDENCE ICD: 513 Ohm
RV LEAD THRESHOLD: 0.75 V
TOT-0002: 0
TZAT-0001ATACH: 1
TZAT-0001ATACH: 2
TZAT-0001SLOWVT: 1
TZAT-0001SLOWVT: 2
TZAT-0002ATACH: NEGATIVE
TZAT-0002ATACH: NEGATIVE
TZAT-0002ATACH: NEGATIVE
TZAT-0004FASTVT: 8
TZAT-0004SLOWVT: 8
TZAT-0004SLOWVT: 8
TZAT-0005FASTVT: 88 pct
TZAT-0005SLOWVT: 88 pct
TZAT-0005SLOWVT: 91 pct
TZAT-0011FASTVT: 10 ms
TZAT-0011SLOWVT: 10 ms
TZAT-0011SLOWVT: 10 ms
TZAT-0012ATACH: 150 ms
TZAT-0012FASTVT: 170 ms
TZAT-0013SLOWVT: 2
TZAT-0018ATACH: NEGATIVE
TZAT-0018ATACH: NEGATIVE
TZAT-0018ATACH: NEGATIVE
TZAT-0018SLOWVT: NEGATIVE
TZAT-0018SLOWVT: NEGATIVE
TZAT-0019ATACH: 6 V
TZAT-0019FASTVT: 8 V
TZAT-0020ATACH: 1.5 ms
TZON-0003ATACH: 350 ms
TZON-0004SLOWVT: 40
TZON-0004VSLOWVT: 20
TZON-0005SLOWVT: 12
TZST-0001FASTVT: 3
TZST-0001FASTVT: 6
TZST-0001SLOWVT: 3
TZST-0001SLOWVT: 4
TZST-0001SLOWVT: 6
TZST-0002ATACH: NEGATIVE
TZST-0002ATACH: NEGATIVE
TZST-0003FASTVT: 35 J
TZST-0003FASTVT: 35 J
TZST-0003FASTVT: 35 J
TZST-0003SLOWVT: 35 J

## 2012-07-09 ENCOUNTER — Ambulatory Visit (INDEPENDENT_AMBULATORY_CARE_PROVIDER_SITE_OTHER): Payer: Medicare Other | Admitting: *Deleted

## 2012-07-09 VITALS — BP 110/62 | HR 86 | Wt 163.0 lb

## 2012-07-09 DIAGNOSIS — I4891 Unspecified atrial fibrillation: Secondary | ICD-10-CM

## 2012-07-09 DIAGNOSIS — Z7901 Long term (current) use of anticoagulants: Secondary | ICD-10-CM

## 2012-07-09 NOTE — Patient Instructions (Addendum)
Return to office in 4 weeks  Continue current dose: 5 mg daily 

## 2012-07-27 ENCOUNTER — Encounter: Payer: Self-pay | Admitting: *Deleted

## 2012-07-28 ENCOUNTER — Other Ambulatory Visit: Payer: Self-pay | Admitting: Internal Medicine

## 2012-08-09 ENCOUNTER — Ambulatory Visit (INDEPENDENT_AMBULATORY_CARE_PROVIDER_SITE_OTHER): Payer: Medicare Other | Admitting: *Deleted

## 2012-08-09 VITALS — BP 132/70 | HR 86 | Wt 160.0 lb

## 2012-08-09 DIAGNOSIS — Z7901 Long term (current) use of anticoagulants: Secondary | ICD-10-CM

## 2012-08-09 DIAGNOSIS — I4891 Unspecified atrial fibrillation: Secondary | ICD-10-CM

## 2012-08-09 NOTE — Patient Instructions (Addendum)
No change. Continue taking coumadin 5mg  daily. Return to the office in 4 weeks.

## 2012-08-26 ENCOUNTER — Ambulatory Visit (INDEPENDENT_AMBULATORY_CARE_PROVIDER_SITE_OTHER): Payer: Medicare Other | Admitting: Internal Medicine

## 2012-08-26 ENCOUNTER — Encounter: Payer: Self-pay | Admitting: Internal Medicine

## 2012-08-26 VITALS — BP 124/76 | HR 88 | Ht 63.0 in | Wt 162.4 lb

## 2012-08-26 DIAGNOSIS — I4891 Unspecified atrial fibrillation: Secondary | ICD-10-CM

## 2012-08-26 DIAGNOSIS — I429 Cardiomyopathy, unspecified: Secondary | ICD-10-CM

## 2012-08-26 DIAGNOSIS — Z95810 Presence of automatic (implantable) cardiac defibrillator: Secondary | ICD-10-CM

## 2012-08-26 LAB — ICD DEVICE OBSERVATION
ATRIAL PACING ICD: 0 pct
CHARGE TIME: 8.808 s
FVT: 0
PACEART VT: 0
TOT-0001: 0
TZAT-0001ATACH: 1
TZAT-0001ATACH: 3
TZAT-0001FASTVT: 1
TZAT-0001SLOWVT: 1
TZAT-0001SLOWVT: 2
TZAT-0002ATACH: NEGATIVE
TZAT-0004SLOWVT: 8
TZAT-0011FASTVT: 10 ms
TZAT-0012ATACH: 150 ms
TZAT-0012ATACH: 150 ms
TZAT-0012SLOWVT: 170 ms
TZAT-0012SLOWVT: 170 ms
TZAT-0013FASTVT: 2
TZAT-0013SLOWVT: 2
TZAT-0013SLOWVT: 2
TZAT-0018ATACH: NEGATIVE
TZAT-0018ATACH: NEGATIVE
TZAT-0018FASTVT: NEGATIVE
TZAT-0018SLOWVT: NEGATIVE
TZAT-0018SLOWVT: NEGATIVE
TZAT-0019ATACH: 6 V
TZAT-0019FASTVT: 8 V
TZAT-0019SLOWVT: 8 V
TZAT-0020ATACH: 1.5 ms
TZAT-0020SLOWVT: 1.5 ms
TZAT-0020SLOWVT: 1.5 ms
TZON-0003ATACH: 350 ms
TZON-0003FASTVT: 210 ms
TZON-0003SLOWVT: 360 ms
TZON-0003VSLOWVT: 450 ms
TZON-0004SLOWVT: 40
TZON-0005SLOWVT: 12
TZST-0001ATACH: 4
TZST-0001ATACH: 5
TZST-0001ATACH: 6
TZST-0001FASTVT: 3
TZST-0001FASTVT: 5
TZST-0001FASTVT: 6
TZST-0001SLOWVT: 3
TZST-0001SLOWVT: 5
TZST-0001SLOWVT: 6
TZST-0002ATACH: NEGATIVE
TZST-0002ATACH: NEGATIVE
TZST-0003FASTVT: 35 J
TZST-0003FASTVT: 35 J
TZST-0003FASTVT: 35 J
TZST-0003SLOWVT: 35 J
TZST-0003SLOWVT: 35 J

## 2012-08-26 NOTE — Progress Notes (Signed)
Patient Care Team: Pecola Lawless, MD as PCP - General   HPI  Michele [name] is a 79 y.o. 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    .  She has done quite well.   She is currently doing quite well. Her husband is recovering from his surgery  Past Medical History  Diagnosis Date  . Cardiomyopathy     RATE RELATED  . Diverticulosis     DIVERTICULITIS  . C. difficile colitis   . Atrial fibrillation     NEG STRESS CARDIOLITE  . Gilbert's syndrome   . Hyperlipidemia     NMR LIOPROFILE LDL 234(3206/2234)HDL 37,TG86  . Thyroid disease     HYPOTHYROIDISM...THYROID NODULES  . Diverticulitis     PMH of X 2  . Hx of colonic polyp     Past Surgical History  Procedure Laterality Date  . Biopsy thyroid      SINGLE NODULE  . Throidectomy 12/08      benign  . Appendectomy  1956  . Cholecystectomy  1972  . Abdominal hysterectomy  1973  . Pacemaker placement      10/2006  . Icd....02/2008    . Breast biopsy  1997  . Rotator cuff repair      x2  . Colonoscopy with polypectomy       X1; negative study 2012  . Cardiac defibrillator placement      Current Outpatient Prescriptions  Medication Sig Dispense Refill  . Biotin 1000 MCG tablet Take 1,000 mcg by mouth daily.      . Calcium Citrate (CITRACAL PO) Take 1 tablet by mouth daily.      . cholecalciferol (VITAMIN D) 1000 UNITS tablet Take 1,000 Units by mouth daily.      Marland Kitchen co-enzyme Q-10 30 MG capsule Take 30 mg by mouth daily.      Marland Kitchen COUMADIN 5 MG tablet TAKE 1 TABLET IN THE P.M.  90 tablet  0  . furosemide (LASIX) 20 MG tablet Take 20 mg by mouth. 1 by mouth every other day , For fluid      . Multiple Vitamin (MULTIVITAMIN WITH MINERALS) TABS Take 1 tablet by mouth daily.      Marland Kitchen saccharomyces boulardii (FLORASTOR) 250 MG capsule Take 250 mg by mouth  daily.        Marland Kitchen SYNTHROID 125 MCG tablet 1/2 by mouth daily  Check TSH in 10-12 weeks  45 tablet  0  . VYTORIN 10-20 MG per tablet TAKE ONE TABLET AT BEDTIME.  90 tablet  1   No current facility-administered medications for this visit.    Allergies  Allergen Reactions  . Cephalexin Hives  . Erythromycin Swelling and Other (See Comments)    REACTION: swollen and sore mouth  . Terbinafine Hcl     Rash   . Colchicine Other (See Comments)    REACTION: violent diarrhea  . Rosuvastatin Other (See Comments)    REACTION: upper arm pain bilaterally    Review of Systems negative except from HPI and PMH  Physical Exam BP 124/76  Pulse 88  Ht 5\' 3"  (1.6 m)  Wt 162 lb 6.4 oz (73.664 kg)  BMI 28.78 kg/m2 Well developed and nourished in no acute distress HENT normal Neck supple with JVP-flat Clear Regular rate and rhythm, no murmurs or gallops Abd-soft with active BS No Clubbing cyanosis edema  Skin-warm and dry A & Oriented  Grossly normal sensory and motor function  ECG demonstrates ventricular pacing with underlying atrial fibrillation   Assessment and  Plan

## 2012-08-26 NOTE — Assessment & Plan Note (Signed)
-  Continue diuretics. 

## 2012-08-26 NOTE — Assessment & Plan Note (Signed)
Permanent. She is on Coumadin. We have discussed the NOACs. She will let us know

## 2012-08-26 NOTE — Patient Instructions (Signed)

## 2012-08-26 NOTE — Assessment & Plan Note (Signed)
The patient's device was interrogated.  The information was reviewed. No changes were made in the programming.    

## 2012-09-06 ENCOUNTER — Other Ambulatory Visit (INDEPENDENT_AMBULATORY_CARE_PROVIDER_SITE_OTHER): Payer: Medicare Other

## 2012-09-06 ENCOUNTER — Ambulatory Visit (INDEPENDENT_AMBULATORY_CARE_PROVIDER_SITE_OTHER): Payer: Medicare Other | Admitting: *Deleted

## 2012-09-06 ENCOUNTER — Encounter: Payer: Self-pay | Admitting: Internal Medicine

## 2012-09-06 ENCOUNTER — Other Ambulatory Visit: Payer: Self-pay | Admitting: *Deleted

## 2012-09-06 VITALS — BP 120/70 | HR 62 | Temp 97.4°F | Wt 162.0 lb

## 2012-09-06 DIAGNOSIS — E039 Hypothyroidism, unspecified: Secondary | ICD-10-CM

## 2012-09-06 DIAGNOSIS — I4891 Unspecified atrial fibrillation: Secondary | ICD-10-CM

## 2012-09-06 DIAGNOSIS — T887XXA Unspecified adverse effect of drug or medicament, initial encounter: Secondary | ICD-10-CM

## 2012-09-06 DIAGNOSIS — E785 Hyperlipidemia, unspecified: Secondary | ICD-10-CM

## 2012-09-06 DIAGNOSIS — Z7901 Long term (current) use of anticoagulants: Secondary | ICD-10-CM

## 2012-09-06 LAB — LIPID PANEL
Cholesterol: 150 mg/dL (ref 0–200)
LDL Cholesterol: 85 mg/dL (ref 0–99)
Total CHOL/HDL Ratio: 3
VLDL: 21.2 mg/dL (ref 0.0–40.0)

## 2012-09-06 LAB — HEPATIC FUNCTION PANEL
Albumin: 4.1 g/dL (ref 3.5–5.2)
Alkaline Phosphatase: 51 U/L (ref 39–117)
Total Protein: 7.6 g/dL (ref 6.0–8.3)

## 2012-09-06 MED ORDER — SYNTHROID 125 MCG PO TABS: ORAL_TABLET | ORAL | Status: DC

## 2012-09-06 NOTE — Telephone Encounter (Signed)
Refill for synthroid sent to [facility]

## 2012-09-06 NOTE — Patient Instructions (Addendum)
Continue with 5mg  daily except every other Wed take 7.5 mg. Recheck INR in 1 month.

## 2012-09-08 ENCOUNTER — Encounter: Payer: Self-pay | Admitting: Internal Medicine

## 2012-09-20 ENCOUNTER — Other Ambulatory Visit: Payer: Self-pay | Admitting: Internal Medicine

## 2012-09-27 ENCOUNTER — Ambulatory Visit (INDEPENDENT_AMBULATORY_CARE_PROVIDER_SITE_OTHER): Payer: Medicare Other | Admitting: Internal Medicine

## 2012-09-27 ENCOUNTER — Encounter: Payer: Self-pay | Admitting: Internal Medicine

## 2012-09-27 VITALS — BP 104/68 | HR 88 | Temp 97.7°F | Wt 163.0 lb

## 2012-09-27 DIAGNOSIS — H53139 Sudden visual loss, unspecified eye: Secondary | ICD-10-CM

## 2012-09-27 DIAGNOSIS — H53133 Sudden visual loss, bilateral: Secondary | ICD-10-CM

## 2012-09-27 DIAGNOSIS — I959 Hypotension, unspecified: Secondary | ICD-10-CM

## 2012-09-27 DIAGNOSIS — R51 Headache: Secondary | ICD-10-CM

## 2012-09-27 LAB — CBC WITH DIFFERENTIAL/PLATELET
Basophils Absolute: 0 10*3/uL (ref 0.0–0.1)
Basophils Relative: 0.4 % (ref 0.0–3.0)
Eosinophils Absolute: 0.1 10*3/uL (ref 0.0–0.7)
Lymphocytes Relative: 33 % (ref 12.0–46.0)
MCHC: 33 g/dL (ref 30.0–36.0)
Neutrophils Relative %: 58.6 % (ref 43.0–77.0)
RBC: 4.75 Mil/uL (ref 3.87–5.11)
WBC: 6.8 10*3/uL (ref 4.5–10.5)

## 2012-09-27 LAB — BASIC METABOLIC PANEL
BUN: 23 mg/dL (ref 6–23)
Chloride: 104 mEq/L (ref 96–112)
Potassium: 4.1 mEq/L (ref 3.5–5.1)
Sodium: 138 mEq/L (ref 135–145)

## 2012-09-27 NOTE — Patient Instructions (Addendum)
She seem to contradict the history as recorded in reference to the relationship among the headache, vision loss, and hypotension. Clarification is required to direct the evaluation optimally.  Please keep a diary of your headaches . Document  each occurrence on the calendar with notation of : #1 any prodrome ( any non headache symptom such as marked fatigue,visual changes, ,etc ) which precedes actual headache ; #2) severity on 1-10 scale; #3) any triggers ( food/ drink,enviromenntal or weather changes ,physical or emotional stress) in 8-12 hour period prior to the headache; & #4) response to any medications or other intervention. Please review "Headache" @ [facility] for additional information.

## 2012-09-27 NOTE — Progress Notes (Signed)
Subjective:    Patient ID: Michele [name], female    DOB: 04/20/1929, 79 y.o.   MRN: [id number]  HPI  Over the past 2-3 weeks she's had recurrent headaches over the crown which are described as throbbing. This is also associated with hypotension with blood pressures as low as 55/44. She also has symptoms of a "blackening" of her vision which can last 30 minutes . No precipitating factors were identified such as foods, alcohol, environmental change,   prolonged computer use or reading . Exacerbating factors include light &  sound  Initially but not now. No activity or  position change factors. Headache was not  treated ; last headache 8/8. Associated symptoms  included nausea w/o vomiting ; photophobia ; & phonophobia.  There's no past history of head trauma ,loss of consciousness, seizure activity, or migraine . She had this clinical presentation remotely while on verapamil.  She is on no antihypertensive medications. She has furosemide which she for edema associated with prolonged travel.Exercise program as treadmill 2  times per week for 60 minutes.Dietary program is heart healthy & low-salt diet.  Her ICD is being monitored remotely through Dr. Odessa Fleming office              Review of Systems No tearing or nasal congestion. No  frontal or facial sinus pressure; nasal purulence ;fever; or sweats. Some chills. No associated limb weakness, numbness, or tingling. No altered mental status or gait abnormality were present. No chest pain, palpitations, dyspnea, claudication, or paroxysmal nocturnal dyspnea described.  Some lightheadedness in am after being up 15 minutes; no syncope. No change in weight No  hearing loss/tinnitus;earache ;otic discharge No significant anxiety,panic , depression No head/neck arthralgias or bitemporal headaches      Objective:   Physical Exam Gen.: Healthy and well-nourished in appearance. Alert, appropriate and cooperative throughout exam.Appears  younger than stated age  Head: Normocephalic without obvious abnormalities  Eyes: No corneal or conjunctival inflammation noted. FOV WNL. Extraocular motion intact. Vision grossly normal with lenses Ears: External  ear exam reveals no significant lesions or deformities. Canals clear .TMs slightly scarred. Hearing is minimally decreased bilaterally. Nose: External nasal exam reveals no deformity or inflammation. Nasal mucosa are pink and moist. No lesions or exudates noted.   Mouth: Oral mucosa and oropharynx reveal no lesions or exudates. Teeth in good repair. Neck: No deformities, masses, or tenderness noted. Range of motion WNL. Lungs: Normal respiratory effort; chest expands symmetrically. Lungs are clear to auscultation without rales, wheezes, or increased work of breathing. Heart: Normal rate and rhythm. Normal S1 and S2. No gallop, click, or rub. No murmur.                       Musculoskeletal/extremities: No deformity or scoliosis noted of  the thoracic or lumbar spine.  No clubbing, cyanosis, edema, or significant extremity  deformity noted. Range of motion normal .Tone & strength  Normal. Joints  reveal minor DJD DIP changes. Nail health good. Able to lie down & sit up w/o help. Negative SLR bilaterally Vascular: Carotid, radial artery,  and  posterior tibial pulses are full and equal. Decreased DPP.No bruits present. Neurologic: Alert and oriented x3. Deep tendon reflexes symmetrical and normal.  + Rhomberg.        Skin: Intact without suspicious lesions or rashes. Lymph: No cervical, axillary lymphadenopathy present. Psych: Mood and affect are normal. Normally interactive  Assessment & Plan:  #1 headache #2 temporary vision loss  #3 hypotension ; not on hypotensive meds #4 ICD Plan: see orders

## 2012-09-28 LAB — SEDIMENTATION RATE: Sed Rate: 17 mm/hr (ref 0–22)

## 2012-10-06 ENCOUNTER — Other Ambulatory Visit: Payer: Self-pay | Admitting: Internal Medicine

## 2012-10-06 ENCOUNTER — Encounter: Payer: Self-pay | Admitting: Internal Medicine

## 2012-10-07 ENCOUNTER — Ambulatory Visit (INDEPENDENT_AMBULATORY_CARE_PROVIDER_SITE_OTHER): Payer: Medicare Other

## 2012-10-07 VITALS — BP 115/70 | HR 87 | Temp 97.8°F | Wt 165.0 lb

## 2012-10-07 DIAGNOSIS — I4891 Unspecified atrial fibrillation: Secondary | ICD-10-CM

## 2012-10-07 LAB — POCT INR: INR: 1.6

## 2012-10-07 NOTE — Patient Instructions (Addendum)
Dr.Tabori verbal orders instruct pt to take 7.5mg  tonight and tomorrow. 5mg  weekend. Then following week to take 7.5mg  mon. wed. fri.  Follow up in 2weeks. 5mg  all other days.

## 2012-10-20 ENCOUNTER — Other Ambulatory Visit: Payer: Self-pay | Admitting: Internal Medicine

## 2012-10-20 NOTE — Telephone Encounter (Signed)
Med filled.  

## 2012-10-21 ENCOUNTER — Telehealth: Payer: Self-pay

## 2012-10-21 ENCOUNTER — Ambulatory Visit (INDEPENDENT_AMBULATORY_CARE_PROVIDER_SITE_OTHER): Payer: Medicare Other

## 2012-10-21 VITALS — BP 122/74 | HR 86 | Temp 97.9°F | Wt 163.0 lb

## 2012-10-21 DIAGNOSIS — I4891 Unspecified atrial fibrillation: Secondary | ICD-10-CM

## 2012-10-21 LAB — POCT INR: INR: 2.4

## 2012-10-21 MED ORDER — BIOTIN 1000 MCG PO TABS: 1000.0000 ug | ORAL_TABLET | Freq: Every day | ORAL | Status: DC

## 2012-10-21 NOTE — Patient Instructions (Addendum)
Take 5mg  daily unless otherwise notified. Recheck in 1 month.

## 2012-10-21 NOTE — Telephone Encounter (Signed)
Patient in for INR check request refill on Vytorin for 90day supply. Labs up to date. Refilled 90x1

## 2012-10-22 ENCOUNTER — Other Ambulatory Visit: Payer: Self-pay | Admitting: Internal Medicine

## 2012-11-09 ENCOUNTER — Ambulatory Visit (INDEPENDENT_AMBULATORY_CARE_PROVIDER_SITE_OTHER): Payer: Medicare Other

## 2012-11-09 DIAGNOSIS — Z23 Encounter for immunization: Secondary | ICD-10-CM

## 2012-11-19 ENCOUNTER — Ambulatory Visit (INDEPENDENT_AMBULATORY_CARE_PROVIDER_SITE_OTHER): Payer: Medicare Other | Admitting: *Deleted

## 2012-11-19 VITALS — BP 118/68 | HR 90 | Temp 97.2°F | Wt 163.0 lb

## 2012-11-19 DIAGNOSIS — Z7901 Long term (current) use of anticoagulants: Secondary | ICD-10-CM

## 2012-11-19 DIAGNOSIS — I4891 Unspecified atrial fibrillation: Secondary | ICD-10-CM

## 2012-11-19 LAB — POCT INR: INR: 2.7

## 2012-11-19 NOTE — Patient Instructions (Addendum)
Take 5mg  daily. Return in 4 weeks for INR recheck

## 2012-11-29 ENCOUNTER — Ambulatory Visit (INDEPENDENT_AMBULATORY_CARE_PROVIDER_SITE_OTHER): Payer: Medicare Other | Admitting: *Deleted

## 2012-11-29 DIAGNOSIS — I4891 Unspecified atrial fibrillation: Secondary | ICD-10-CM

## 2012-11-29 DIAGNOSIS — I429 Cardiomyopathy, unspecified: Secondary | ICD-10-CM

## 2012-11-29 DIAGNOSIS — Z95810 Presence of automatic (implantable) cardiac defibrillator: Secondary | ICD-10-CM

## 2012-11-30 LAB — REMOTE ICD DEVICE
AL IMPEDENCE ICD: 437 Ohm
BATTERY VOLTAGE: 3.1048 V
LV LEAD THRESHOLD: 1.125 V
PACEART VT: 0
RV LEAD THRESHOLD: 0.625 V
TOT-0002: 0
TOT-0006: 20130704000000
TZAT-0001ATACH: 1
TZAT-0001ATACH: 2
TZAT-0002ATACH: NEGATIVE
TZAT-0002ATACH: NEGATIVE
TZAT-0002ATACH: NEGATIVE
TZAT-0004FASTVT: 8
TZAT-0004SLOWVT: 8
TZAT-0004SLOWVT: 8
TZAT-0005SLOWVT: 88 pct
TZAT-0005SLOWVT: 91 pct
TZAT-0011FASTVT: 10 ms
TZAT-0011SLOWVT: 10 ms
TZAT-0011SLOWVT: 10 ms
TZAT-0012FASTVT: 170 ms
TZAT-0018ATACH: NEGATIVE
TZAT-0018ATACH: NEGATIVE
TZAT-0018SLOWVT: NEGATIVE
TZAT-0018SLOWVT: NEGATIVE
TZAT-0019ATACH: 6 V
TZAT-0019ATACH: 6 V
TZAT-0020ATACH: 1.5 ms
TZON-0003ATACH: 350 ms
TZON-0004SLOWVT: 40
TZON-0004VSLOWVT: 20
TZON-0005SLOWVT: 12
TZST-0001ATACH: 5
TZST-0001FASTVT: 3
TZST-0001SLOWVT: 4
TZST-0001SLOWVT: 6
TZST-0002ATACH: NEGATIVE
TZST-0002ATACH: NEGATIVE
TZST-0003FASTVT: 35 J
TZST-0003FASTVT: 35 J
TZST-0003SLOWVT: 15 J
TZST-0003SLOWVT: 35 J

## 2012-12-01 DIAGNOSIS — Z23 Encounter for immunization: Secondary | ICD-10-CM

## 2012-12-08 ENCOUNTER — Encounter: Payer: Self-pay | Admitting: Internal Medicine

## 2012-12-16 ENCOUNTER — Ambulatory Visit (INDEPENDENT_AMBULATORY_CARE_PROVIDER_SITE_OTHER): Payer: Medicare Other | Admitting: *Deleted

## 2012-12-16 VITALS — BP 120/80 | HR 86 | Temp 97.8°F | Wt 162.4 lb

## 2012-12-16 DIAGNOSIS — Z7901 Long term (current) use of anticoagulants: Secondary | ICD-10-CM

## 2012-12-16 DIAGNOSIS — I4891 Unspecified atrial fibrillation: Secondary | ICD-10-CM

## 2012-12-16 LAB — POCT INR: INR: 2.6

## 2012-12-16 NOTE — Patient Instructions (Signed)
Take 5mg daily. Return in 4 weeks for INR recheck  

## 2012-12-17 ENCOUNTER — Ambulatory Visit: Payer: Medicare Other

## 2012-12-27 ENCOUNTER — Encounter: Payer: Self-pay | Admitting: Internal Medicine

## 2012-12-28 ENCOUNTER — Other Ambulatory Visit: Payer: Self-pay | Admitting: Internal Medicine

## 2012-12-28 NOTE — Telephone Encounter (Signed)
Coumadin refilled. 

## 2013-01-04 ENCOUNTER — Ambulatory Visit: Payer: Medicare Other

## 2013-01-04 ENCOUNTER — Ambulatory Visit (INDEPENDENT_AMBULATORY_CARE_PROVIDER_SITE_OTHER): Payer: Medicare Other | Admitting: Internal Medicine

## 2013-01-04 ENCOUNTER — Encounter: Payer: Self-pay | Admitting: Internal Medicine

## 2013-01-04 VITALS — BP 102/60 | HR 74 | Temp 97.9°F | Ht 62.75 in | Wt 160.8 lb

## 2013-01-04 DIAGNOSIS — Z7901 Long term (current) use of anticoagulants: Secondary | ICD-10-CM

## 2013-01-04 DIAGNOSIS — I951 Orthostatic hypotension: Secondary | ICD-10-CM

## 2013-01-04 DIAGNOSIS — G450 Vertebro-basilar artery syndrome: Secondary | ICD-10-CM

## 2013-01-04 DIAGNOSIS — H8110 Benign paroxysmal vertigo, unspecified ear: Secondary | ICD-10-CM

## 2013-01-04 LAB — POCT INR: INR: 3.2

## 2013-01-04 NOTE — Patient Instructions (Addendum)
Repeat the isometric exercises discussed 4- 5 times prior to standing if you've been seated for a period of time. Go to [facility] for information on benign positional vertigo (BPV) . Physical therapy referral can treat that. Avoid neck hyperextension as discussed. Warfarin 2.5 mg every Weds; 5 mg all other days . Recheck PT/INR in 4 weeks

## 2013-01-04 NOTE — Progress Notes (Signed)
Due to Long term coagulation therapy patient is checked to today for INR. Result is 3.2.  Patient states that she is taking 5mg  coumadin everyday.  Per Dr [name] verbal take 5mg  everyday except Wed take 2.5mg  recheck in one month.

## 2013-01-04 NOTE — Progress Notes (Signed)
Pre visit review using our clinic review tool, if applicable. No additional management support is needed unless otherwise documented below in the visit note. 

## 2013-01-04 NOTE — Progress Notes (Signed)
Subjective:    Patient ID: Michele [name], female    DOB: February 18, 1930, 79 y.o.   MRN: [id number]  HPI Symptoms began 3 weeks ago as a sensation of true vertigo with the room spinning. This was initiated by multiple activities such as turning over in bed; a rising from a chair; and hyperextending her neck/head. All seem to be equal as far as causing the symptoms but after rising from a chair symptoms will persist longer. Symptoms can last hours. She describes a clumsy gait w/o falls to date  Also intermittently during this period time she's had a pounding headache over the crown.  She's noted some associated nausea and chilling without associated fever or sweats.   Review of Systems She also denies seizure activity or frank syncope  The symptoms have not been associated with frontal sinus pain, facial pain, or nasal purulence  She also is has not had hearing loss, tinnitus, earache, or otic discharge.  The been no visual changes or blurred vision, double vision, or loss of vision  The symptoms were not associated with acute dyspnea, cough, hemoptysis, straining or pain. She also noted no change in heart rhythm or rate prior to the symptoms.  There's been no associated weakness, tingling, numbness in her extremities       Objective:   Physical Exam  Gen.:  well-nourished in appearance. Alert, appropriate and cooperative throughout exam.Appears younger than stated age  Head: Normocephalic without obvious abnormalities  Eyes: No corneal or conjunctival inflammation noted. Pupils equal round reactive to light and accommodation. Extraocular motion intact. FOV normal. No nystagmus Ears: External  ear exam reveals no significant lesions or deformities. Canals clear .TMs normal. Hearing is grossly normal bilaterally.Tuning fork lateralizes to R. AC> BC blaterally Nose: External nasal exam reveals no deformity or inflammation. Nasal mucosa are pink and moist. No lesions or exudates noted.   Mouth: Oral mucosa and oropharynx reveal no lesions or exudates. Teeth in good repair. Neck: No deformities, masses, or tenderness noted. Range of motion good Lungs: Normal respiratory effort; chest expands symmetrically. Lungs are clear to auscultation without rales, wheezes, or increased work of breathing. Heart: Normal rate and rhythm. Normal S1 and S2. No gallop, click, or rub. S4 w/o murmur. Musculoskeletal/extremities:  No clubbing, cyanosis, edema, or significant extremity  deformity noted. Range of motion normal .Tone & strength normal. Hand joints  reveal mild  DJD DIP changes. Fingernail  health good. Able to lie down & sit up w/o help . Tuning head in either direction as lowered did not cause symptoms. Negative SLR bilaterally Vascular: Carotid, radial artery, dorsalis pedis and  posterior tibial pulses are full and equal. No bruits present. Neurologic: Alert and oriented x3. Deep tendon reflexes symmetrical and 1.5+.  Gait normal  . Rhomberg strongly positive & finger to nose with some mild past pointing      Skin: Intact without suspicious lesions or rashes. Lymph: No cervical, axillary lymphadenopathy present. Psych: Mood and affect are normal. Normally interactive  Assessment & Plan:  #1 dizziness/vertigo with multiple triggers including standing up, neck hyperextension, and turning over in bed. Postural hypotension documented. Possible vertebrobasilar component in the context of her cervical degenerative joint disease   #2 headache over the crown, intermittent  #3 abnormal tuning fork exam  #4 adequate anticoagulation; see change in warfarin dose  Plan:  Isometrics prior to standing; may need support hose. Physical therapy consultation for benign positional vertigo component. Neurology referral if symptoms progress

## 2013-01-11 ENCOUNTER — Ambulatory Visit: Payer: Medicare Other | Attending: Internal Medicine | Admitting: Physical Therapy

## 2013-01-11 DIAGNOSIS — IMO0001 Reserved for inherently not codable concepts without codable children: Secondary | ICD-10-CM | POA: Insufficient documentation

## 2013-01-11 DIAGNOSIS — R269 Unspecified abnormalities of gait and mobility: Secondary | ICD-10-CM | POA: Insufficient documentation

## 2013-01-11 DIAGNOSIS — R42 Dizziness and giddiness: Secondary | ICD-10-CM | POA: Insufficient documentation

## 2013-01-18 ENCOUNTER — Ambulatory Visit: Payer: Medicare Other | Attending: Internal Medicine | Admitting: Physical Therapy

## 2013-01-18 DIAGNOSIS — R42 Dizziness and giddiness: Secondary | ICD-10-CM | POA: Insufficient documentation

## 2013-01-18 DIAGNOSIS — IMO0001 Reserved for inherently not codable concepts without codable children: Secondary | ICD-10-CM | POA: Insufficient documentation

## 2013-01-18 DIAGNOSIS — R269 Unspecified abnormalities of gait and mobility: Secondary | ICD-10-CM | POA: Insufficient documentation

## 2013-01-21 ENCOUNTER — Ambulatory Visit: Payer: Medicare Other | Admitting: Physical Therapy

## 2013-01-25 ENCOUNTER — Ambulatory Visit: Payer: Medicare Other | Admitting: Physical Therapy

## 2013-01-27 ENCOUNTER — Ambulatory Visit: Payer: Medicare Other | Admitting: Physical Therapy

## 2013-01-31 ENCOUNTER — Ambulatory Visit: Payer: Medicare Other | Admitting: Physical Therapy

## 2013-02-01 ENCOUNTER — Ambulatory Visit (INDEPENDENT_AMBULATORY_CARE_PROVIDER_SITE_OTHER): Payer: Medicare Other | Admitting: *Deleted

## 2013-02-01 VITALS — BP 116/68 | HR 86 | Temp 97.9°F | Wt 164.0 lb

## 2013-02-01 DIAGNOSIS — I4891 Unspecified atrial fibrillation: Secondary | ICD-10-CM

## 2013-02-01 DIAGNOSIS — E785 Hyperlipidemia, unspecified: Secondary | ICD-10-CM

## 2013-02-01 DIAGNOSIS — Z7901 Long term (current) use of anticoagulants: Secondary | ICD-10-CM

## 2013-02-01 LAB — POCT INR: INR: 1.4

## 2013-02-01 MED ORDER — EZETIMIBE-SIMVASTATIN 10-20 MG PO TABS: 1.0000 | ORAL_TABLET | Freq: Every day | ORAL | Status: DC

## 2013-02-01 NOTE — Patient Instructions (Signed)
Take 10mg  tonight (12/16). On Wed 12/17 take 7.5 mg then resume 5 mg daily. Return in 2 weeks for INR recheck

## 2013-02-02 ENCOUNTER — Encounter: Payer: Medicare Other | Admitting: Physical Therapy

## 2013-02-07 ENCOUNTER — Encounter: Payer: Medicare Other | Admitting: Physical Therapy

## 2013-02-08 ENCOUNTER — Encounter: Payer: Medicare Other | Admitting: Physical Therapy

## 2013-02-15 ENCOUNTER — Ambulatory Visit (INDEPENDENT_AMBULATORY_CARE_PROVIDER_SITE_OTHER): Payer: Medicare Other | Admitting: General Practice

## 2013-02-15 VITALS — BP 122/74 | HR 81 | Temp 97.8°F | Resp 16 | Wt 165.4 lb

## 2013-02-15 DIAGNOSIS — I4891 Unspecified atrial fibrillation: Secondary | ICD-10-CM

## 2013-02-15 DIAGNOSIS — Z7901 Long term (current) use of anticoagulants: Secondary | ICD-10-CM

## 2013-02-15 LAB — POCT INR: INR: 2.4

## 2013-02-15 NOTE — Progress Notes (Signed)
Pre visit review using our clinic review tool, if applicable. No additional management support is needed unless otherwise documented below in the visit note. 

## 2013-02-15 NOTE — Patient Instructions (Signed)
New Dose: 5mg  daily, Except Wednesdays take 2.5mg   Return in 4 weeks

## 2013-02-20 ENCOUNTER — Other Ambulatory Visit: Payer: Self-pay | Admitting: Internal Medicine

## 2013-02-21 NOTE — Telephone Encounter (Signed)
Synthroid refilled per protocol. JG//CMA

## 2013-02-25 ENCOUNTER — Encounter: Payer: Self-pay | Admitting: Internal Medicine

## 2013-02-25 ENCOUNTER — Ambulatory Visit (INDEPENDENT_AMBULATORY_CARE_PROVIDER_SITE_OTHER): Payer: Medicare Other | Admitting: Internal Medicine

## 2013-02-25 VITALS — BP 117/70 | HR 87 | Temp 98.0°F | Wt 165.8 lb

## 2013-02-25 DIAGNOSIS — L659 Nonscarring hair loss, unspecified: Secondary | ICD-10-CM

## 2013-02-25 DIAGNOSIS — E039 Hypothyroidism, unspecified: Secondary | ICD-10-CM

## 2013-02-25 DIAGNOSIS — I951 Orthostatic hypotension: Secondary | ICD-10-CM

## 2013-02-25 LAB — CBC WITH DIFFERENTIAL/PLATELET
BASOS ABS: 0 10*3/uL (ref 0.0–0.1)
BASOS PCT: 0 % (ref 0–1)
EOS ABS: 0.1 10*3/uL (ref 0.0–0.7)
EOS PCT: 2 % (ref 0–5)
HEMATOCRIT: 40.9 % (ref 36.0–46.0)
Hemoglobin: 14.1 g/dL (ref 12.0–15.0)
Lymphocytes Relative: 39 % (ref 12–46)
Lymphs Abs: 2.7 10*3/uL (ref 0.7–4.0)
MCH: 30.9 pg (ref 26.0–34.0)
MCHC: 34.5 g/dL (ref 30.0–36.0)
MCV: 89.5 fL (ref 78.0–100.0)
MONO ABS: 0.6 10*3/uL (ref 0.1–1.0)
Monocytes Relative: 8 % (ref 3–12)
Neutro Abs: 3.5 10*3/uL (ref 1.7–7.7)
Neutrophils Relative %: 51 % (ref 43–77)
PLATELETS: 200 10*3/uL (ref 150–400)
RBC: 4.57 MIL/uL (ref 3.87–5.11)
RDW: 14.7 % (ref 11.5–15.5)
WBC: 6.9 10*3/uL (ref 4.0–10.5)

## 2013-02-25 NOTE — Progress Notes (Signed)
   Subjective:    Patient ID: Michele [name], female    DOB: Feb 27, 1929, 80 y.o.   MRN: [id number]  HPI   Physical therapi st seen for dizziness & nausea in context of BPV  was concerned because of postural hypotension. She is on no antihypertensive medications and takes the furosemide as needed only. Do not have the exact data.    Review of Systems She's also concerned about hair loss over past 6 mos noted while showering and is questioning whether this is related to her thyroid dose. In April of this year her TSH was 0.29. Followup TSH after adjustment in thyroid dose was 4.99 in July.     Objective:   Physical Exam Appears healthy and well-nourished & in no acute distress.Appears younger than stated age No lid lag , proptosis or nystagmus No carotid bruits are present.No neck pain distention present at 10 - 15 degrees. Thyroid absent Heart rhythm and rate are normal with no significant murmurs or gallops. S4.  Supine blood pressure 128/78; sitting 120/78; and standing 120/78. Chest is clear with no increased work of breathing  There is no evidence of aortic aneurysm or renal artery bruits  Abdomen soft with no organomegaly or masses. No HJR  No clubbing, cyanosis or edema present.  Pedal pulses are intact ; decreased DPP  No ischemic skin changes are present . Nails healthy without onycholysis  Alert and oriented. Strength, tone, DTRs reflexes normal. Rhomberg unsteady          Assessment & Plan:  #1 postural hypotension, not documented  #2 hair loss  #3 hypothyroidism post thyroid removal  Plan see orders

## 2013-02-25 NOTE — Progress Notes (Signed)
Pre visit review using our clinic review tool, if applicable. No additional management support is needed unless otherwise documented below in the visit note. 

## 2013-02-25 NOTE — Patient Instructions (Addendum)
Your next office appointment will be determined based upon review of your pending labs &/ or x-rays. Those instructions will be transmitted to you through My Chart  or by mail if you're not using this system.   Followup as needed for your this acute issue. Please report any significant change in your symptoms.Perform isometric exercise of calves  ( while seated go up on toes to count of 5 & then onto heels for 5 count). Repeat  4- 5 times prior to standing if you've been seated or supine for any significant period of time as BP drops with such positions.   Use T-Gel , a coal tar shampoo, one 2 times per week. This will have an antibacterial effect on scalp inflammation.Dermatology referral if no better.

## 2013-02-25 NOTE — Addendum Note (Signed)
Addended by: Modena Morrow D on: 02/25/2013 04:44 PM   Modules accepted: Orders

## 2013-02-26 LAB — TSH: TSH: 1.775 u[IU]/mL (ref 0.350–4.500)

## 2013-03-04 ENCOUNTER — Ambulatory Visit (INDEPENDENT_AMBULATORY_CARE_PROVIDER_SITE_OTHER): Payer: Medicare Other | Admitting: *Deleted

## 2013-03-04 DIAGNOSIS — I509 Heart failure, unspecified: Secondary | ICD-10-CM

## 2013-03-04 DIAGNOSIS — I429 Cardiomyopathy, unspecified: Secondary | ICD-10-CM

## 2013-03-04 DIAGNOSIS — I4891 Unspecified atrial fibrillation: Secondary | ICD-10-CM

## 2013-03-08 LAB — MDC_IDC_ENUM_SESS_TYPE_REMOTE
Brady Statistic AP VP Percent: 0 %
Brady Statistic AP VS Percent: 0 %
Brady Statistic AS VP Percent: 99.68 %
Brady Statistic AS VS Percent: 0.32 %
Brady Statistic RA Percent Paced: 0 %
Brady Statistic RV Percent Paced: 99.68 %
Date Time Interrogation Session: 20150116062406
HIGH POWER IMPEDANCE MEASURED VALUE: 47 Ohm
HighPow Impedance: 190 Ohm
HighPow Impedance: 380 Ohm
HighPow Impedance: 65 Ohm
Lead Channel Impedance Value: 437 Ohm
Lead Channel Impedance Value: 494 Ohm
Lead Channel Impedance Value: 513 Ohm
Lead Channel Impedance Value: 589 Ohm
Lead Channel Impedance Value: 950 Ohm
Lead Channel Pacing Threshold Amplitude: 0.625 V
Lead Channel Pacing Threshold Pulse Width: 0.8 ms
Lead Channel Sensing Intrinsic Amplitude: 1 mV
Lead Channel Setting Pacing Amplitude: 2.5 V
MDC IDC MSMT BATTERY VOLTAGE: 3.06 V
MDC IDC MSMT LEADCHNL LV PACING THRESHOLD AMPLITUDE: 1.125 V
MDC IDC MSMT LEADCHNL RA SENSING INTR AMPL: 1 mV
MDC IDC MSMT LEADCHNL RV PACING THRESHOLD PULSEWIDTH: 0.4 ms
MDC IDC SET LEADCHNL LV PACING AMPLITUDE: 2.25 V
MDC IDC SET LEADCHNL LV PACING PULSEWIDTH: 0.8 ms
MDC IDC SET LEADCHNL RV PACING PULSEWIDTH: 0.4 ms
MDC IDC SET LEADCHNL RV SENSING SENSITIVITY: 0.3 mV
MDC IDC SET ZONE DETECTION INTERVAL: 360 ms
Zone Setting Detection Interval: 210 ms
Zone Setting Detection Interval: 250 ms
Zone Setting Detection Interval: 350 ms
Zone Setting Detection Interval: 450 ms

## 2013-03-15 ENCOUNTER — Ambulatory Visit (INDEPENDENT_AMBULATORY_CARE_PROVIDER_SITE_OTHER): Payer: Medicare Other | Admitting: *Deleted

## 2013-03-15 VITALS — BP 124/80 | HR 88 | Temp 98.3°F | Wt 169.0 lb

## 2013-03-15 DIAGNOSIS — I4891 Unspecified atrial fibrillation: Secondary | ICD-10-CM

## 2013-03-15 DIAGNOSIS — Z7901 Long term (current) use of anticoagulants: Secondary | ICD-10-CM

## 2013-03-15 LAB — POCT INR: INR: 1.7

## 2013-03-15 NOTE — Patient Instructions (Signed)
Take 7.5mg  today and tomorrow, then 5mg  daily. Recheck in 10 days

## 2013-03-23 ENCOUNTER — Encounter: Payer: Self-pay | Admitting: *Deleted

## 2013-03-25 ENCOUNTER — Ambulatory Visit: Payer: Medicare Other

## 2013-03-25 ENCOUNTER — Ambulatory Visit (INDEPENDENT_AMBULATORY_CARE_PROVIDER_SITE_OTHER): Payer: Medicare Other

## 2013-03-25 VITALS — BP 128/76 | HR 85 | Temp 97.4°F | Wt 167.8 lb

## 2013-03-25 DIAGNOSIS — I4891 Unspecified atrial fibrillation: Secondary | ICD-10-CM

## 2013-03-25 DIAGNOSIS — Z7901 Long term (current) use of anticoagulants: Secondary | ICD-10-CM

## 2013-03-25 LAB — POCT INR: INR: 2.2

## 2013-03-25 NOTE — Patient Instructions (Signed)
Take 5 mg daily unless notified by Estrellita Ludwig in one month unless otherwise notified

## 2013-03-29 ENCOUNTER — Encounter: Payer: Self-pay | Admitting: Internal Medicine

## 2013-04-06 ENCOUNTER — Telehealth: Payer: Self-pay | Admitting: *Deleted

## 2013-04-06 ENCOUNTER — Ambulatory Visit: Payer: Medicare Other | Admitting: Internal Medicine

## 2013-04-06 NOTE — Telephone Encounter (Signed)
ToShelba Flake Fax: [phone] From: [facility] Date/ Time: 04/05/2013 2:05 PM Taken By: Luella Cook, CSR Caller: [place]: not collected Patient: Michele, [name] DOB: [birth year] Phone: [phone] Reason for Call: See info below Regarding Appointment: Yes Appt Date: 04/06/2013 Appt Time: 2:00:00 PM Provider: Unice Cobble Reason: Cancel Appointment Details: Pt will call office to reschedule. Outcome: Cancelled appointment in [facility])

## 2013-04-20 ENCOUNTER — Ambulatory Visit (INDEPENDENT_AMBULATORY_CARE_PROVIDER_SITE_OTHER): Payer: Medicare Other | Admitting: Family Medicine

## 2013-04-20 DIAGNOSIS — I4891 Unspecified atrial fibrillation: Secondary | ICD-10-CM

## 2013-04-20 DIAGNOSIS — Z5181 Encounter for therapeutic drug level monitoring: Secondary | ICD-10-CM

## 2013-04-20 LAB — POCT INR: INR: 1.7

## 2013-04-21 ENCOUNTER — Other Ambulatory Visit: Payer: Self-pay | Admitting: Internal Medicine

## 2013-05-24 ENCOUNTER — Ambulatory Visit (INDEPENDENT_AMBULATORY_CARE_PROVIDER_SITE_OTHER): Payer: Medicare Other | Admitting: General Practice

## 2013-05-24 DIAGNOSIS — Z5181 Encounter for therapeutic drug level monitoring: Secondary | ICD-10-CM

## 2013-05-24 DIAGNOSIS — I4891 Unspecified atrial fibrillation: Secondary | ICD-10-CM

## 2013-05-24 LAB — POCT INR: INR: 4.9

## 2013-05-24 NOTE — Progress Notes (Signed)
Pre visit review using our clinic review tool, if applicable. No additional management support is needed unless otherwise documented below in the visit note. 

## 2013-06-01 ENCOUNTER — Emergency Department (HOSPITAL_COMMUNITY)
Admission: EM | Admit: 2013-06-01 | Discharge: 2013-06-01 | Disposition: A | Discharge status: Home / Self Care | Payer: Medicare Other | Attending: Emergency Medicine | Admitting: Emergency Medicine

## 2013-06-01 ENCOUNTER — Emergency Department (HOSPITAL_COMMUNITY): Payer: Medicare Other

## 2013-06-01 ENCOUNTER — Encounter (HOSPITAL_COMMUNITY): Payer: Self-pay | Admitting: Emergency Medicine

## 2013-06-01 DIAGNOSIS — Z860100 Personal history of colon polyps, unspecified: Secondary | ICD-10-CM | POA: Insufficient documentation

## 2013-06-01 DIAGNOSIS — M549 Dorsalgia, unspecified: Secondary | ICD-10-CM | POA: Insufficient documentation

## 2013-06-01 DIAGNOSIS — Z7901 Long term (current) use of anticoagulants: Secondary | ICD-10-CM | POA: Insufficient documentation

## 2013-06-01 DIAGNOSIS — Z79899 Other long term (current) drug therapy: Secondary | ICD-10-CM | POA: Insufficient documentation

## 2013-06-01 DIAGNOSIS — Z9889 Other specified postprocedural states: Secondary | ICD-10-CM | POA: Insufficient documentation

## 2013-06-01 DIAGNOSIS — I4891 Unspecified atrial fibrillation: Secondary | ICD-10-CM | POA: Insufficient documentation

## 2013-06-01 DIAGNOSIS — E785 Hyperlipidemia, unspecified: Secondary | ICD-10-CM | POA: Insufficient documentation

## 2013-06-01 DIAGNOSIS — K5900 Constipation, unspecified: Secondary | ICD-10-CM | POA: Insufficient documentation

## 2013-06-01 DIAGNOSIS — Z9089 Acquired absence of other organs: Secondary | ICD-10-CM | POA: Insufficient documentation

## 2013-06-01 DIAGNOSIS — R109 Unspecified abdominal pain: Secondary | ICD-10-CM

## 2013-06-01 LAB — URINALYSIS, ROUTINE W REFLEX MICROSCOPIC
Bilirubin Urine: NEGATIVE
Glucose, UA: NEGATIVE mg/dL
Hgb urine dipstick: NEGATIVE
Ketones, ur: NEGATIVE mg/dL
LEUKOCYTES UA: NEGATIVE
NITRITE: NEGATIVE
Protein, ur: NEGATIVE mg/dL
SPECIFIC GRAVITY, URINE: 1.011 (ref 1.005–1.030)
Urobilinogen, UA: 0.2 mg/dL (ref 0.0–1.0)
pH: 8 (ref 5.0–8.0)

## 2013-06-01 LAB — CBC WITH DIFFERENTIAL/PLATELET
BASOS PCT: 0 % (ref 0–1)
Basophils Absolute: 0 10*3/uL (ref 0.0–0.1)
EOS PCT: 2 % (ref 0–5)
Eosinophils Absolute: 0.1 10*3/uL (ref 0.0–0.7)
HCT: 40.5 % (ref 36.0–46.0)
Hemoglobin: 14.2 g/dL (ref 12.0–15.0)
Lymphocytes Relative: 28 % (ref 12–46)
Lymphs Abs: 2.4 10*3/uL (ref 0.7–4.0)
MCH: 31.9 pg (ref 26.0–34.0)
MCHC: 35.1 g/dL (ref 30.0–36.0)
MCV: 91 fL (ref 78.0–100.0)
MONO ABS: 0.7 10*3/uL (ref 0.1–1.0)
Monocytes Relative: 8 % (ref 3–12)
NEUTROS ABS: 5.3 10*3/uL (ref 1.7–7.7)
Neutrophils Relative %: 62 % (ref 43–77)
Platelets: 167 10*3/uL (ref 150–400)
RBC: 4.45 MIL/uL (ref 3.87–5.11)
RDW: 14.1 % (ref 11.5–15.5)
WBC: 8.5 10*3/uL (ref 4.0–10.5)

## 2013-06-01 LAB — BASIC METABOLIC PANEL
BUN: 15 mg/dL (ref 6–23)
CO2: 24 mEq/L (ref 19–32)
CREATININE: 0.9 mg/dL (ref 0.50–1.10)
Calcium: 8.7 mg/dL (ref 8.4–10.5)
Chloride: 101 mEq/L (ref 96–112)
GFR calc non Af Amer: 58 mL/min — ABNORMAL LOW (ref >90)
GFR, EST AFRICAN AMERICAN: 68 mL/min — AB (ref >90)
Glucose, Bld: 96 mg/dL (ref 70–99)
Potassium: 3.7 mEq/L (ref 3.7–5.3)
SODIUM: 139 meq/L (ref 137–147)

## 2013-06-01 LAB — PROTIME-INR
INR: 2.44 — ABNORMAL HIGH (ref 0.00–1.49)
Prothrombin Time: 25.7 seconds — ABNORMAL HIGH (ref 11.6–15.2)

## 2013-06-01 MED ORDER — IOHEXOL 300 MG/ML  SOLN
80.0000 mL | Freq: Once | INTRAMUSCULAR | Status: AC | PRN
  Administered 2013-06-01: 80 mL via INTRAVENOUS

## 2013-06-01 MED ORDER — ONDANSETRON HCL 4 MG/2ML IJ SOLN
4.0000 mg | Freq: Once | INTRAMUSCULAR | Status: AC
  Administered 2013-06-01: 4 mg via INTRAVENOUS
  Filled 2013-06-01: qty 2

## 2013-06-01 MED ORDER — SODIUM CHLORIDE 0.9 % IV BOLUS (SEPSIS)
500.0000 mL | Freq: Once | INTRAVENOUS | Status: AC
  Administered 2013-06-01: 500 mL via INTRAVENOUS

## 2013-06-01 MED ORDER — MORPHINE SULFATE 4 MG/ML IJ SOLN
4.0000 mg | Freq: Once | INTRAMUSCULAR | Status: AC
  Administered 2013-06-01: 4 mg via INTRAVENOUS
  Filled 2013-06-01: qty 1

## 2013-06-01 MED ORDER — IOHEXOL 300 MG/ML  SOLN: 25.0000 mL | INTRAMUSCULAR | Status: AC

## 2013-06-01 MED ORDER — HYDROCODONE-ACETAMINOPHEN 5-325 MG PO TABS: 1.0000 | ORAL_TABLET | ORAL | Status: DC | PRN

## 2013-06-01 NOTE — ED Notes (Signed)
Patient returned from CT

## 2013-06-01 NOTE — Discharge Instructions (Signed)
Read the information below.  Use the prescribed medication as directed.  Please discuss all new medications with your pharmacist.  Do not take additional tylenol while taking the prescribed pain medication to avoid overdose.  You may return to the Emergency Department at any time for worsening condition or any new symptoms that concern you.  If you develop high fevers, worsening abdominal pain, uncontrolled vomiting, you are unable to urinate, or are unable to tolerate fluids by mouth, return to the ER for a recheck.     Abdominal Pain, Adult Many things can cause abdominal pain. Usually, abdominal pain is not caused by a disease and will improve without treatment. It can often be observed and treated at home. Your health care provider will do a physical exam and possibly order blood tests and X-rays to help determine the seriousness of your pain. However, in many cases, more time must pass before a clear cause of the pain can be found. Before that point, your health care provider may not know if you need more testing or further treatment. HOME CARE INSTRUCTIONS  Monitor your abdominal pain for any changes. The following actions may help to alleviate any discomfort you are experiencing:  Only take over-the-counter or prescription medicines as directed by your health care provider.  Do not take laxatives unless directed to do so by your health care provider.  Try a clear liquid diet (broth, tea, or water) as directed by your health care provider. Slowly move to a bland diet as tolerated. SEEK MEDICAL CARE IF:  You have unexplained abdominal pain.  You have abdominal pain associated with nausea or diarrhea.  You have pain when you urinate or have a bowel movement.  You experience abdominal pain that wakes you in the night.  You have abdominal pain that is worsened or improved by eating food.  You have abdominal pain that is worsened with eating fatty foods. SEEK IMMEDIATE MEDICAL CARE IF:    Your pain does not go away within 2 hours.  You have a fever.  You keep throwing up (vomiting).  Your pain is felt only in portions of the abdomen, such as the right side or the left lower portion of the abdomen.  You pass bloody or black tarry stools. MAKE SURE YOU:  Understand these instructions.   Will watch your condition.   Will get help right away if you are not doing well or get worse.  Document Released: 11/13/2004 Document Revised: 11/24/2012 Document Reviewed: 10/13/2012 [facility] Patient Information 2014 [place].

## 2013-06-01 NOTE — ED Notes (Signed)
Pt c/o urine retention since yesterday at [place] she has never had this before.

## 2013-06-01 NOTE — ED Provider Notes (Signed)
CSN: [id number]     Arrival date & time 06/01/13  0555 History   First MD Initiated Contact with Patient 06/01/13 0601     Chief Complaint  Patient presents with  . Urinary Retention     (Consider location/radiation/quality/duration/timing/severity/associated sxs/prior Treatment) HPI  Pt presents with intermittent suprapubic discomfort gradually worsening over the past four days, overnight (3am) developed crampy intermittent lower back and lower abdominal pain that felt like labor pains.  This woke her from sleep. She last urinated at 8pm yesterday and has been unable to urinate since.  Notes she has been constipated recently but not more than usual. Denies fevers, chills, N/V, diarrhea, abnormal bleeding.  No radiation of back pain, no weakness or numbness of the legs.  Last BM was yesterday, described as "constipated."   Has started taking steroids (prednisone) recently but no other changes in her medications.  Last INR was 4.9.  Has had multiple abdominal surgeries: cholecystectomy, appendectomy, partial hysterectomy, cesarean section.    Past Medical History  Diagnosis Date  . Cardiomyopathy     RATE RELATED  . Diverticulosis     DIVERTICULITIS  . C. difficile colitis   . Atrial fibrillation     NEG STRESS CARDIOLITE  . Gilbert's syndrome   . Hyperlipidemia     NMR LIOPROFILE LDL 234(3206/2234)HDL 37,TG86  . Thyroid disease     HYPOTHYROIDISM...THYROID NODULES  . Diverticulitis     PMH of X 2  . Hx of colonic polyp    Past Surgical History  Procedure Laterality Date  . Biopsy thyroid      SINGLE NODULE  . Throidectomy 12/08      benign  . Appendectomy  1956  . Cholecystectomy  1972  . Abdominal hysterectomy  1973  . Pacemaker placement      10/2006  . Icd....02/2008    . Breast biopsy  1997  . Rotator cuff repair      x2  . Colonoscopy with polypectomy       X1; negative study 2012  . Cardiac defibrillator placement     Family History  Problem Relation Age of  Onset  . Heart attack Mother 26  . Heart attack Brother 76  . Lung disease Father     Silicosis  . Vasculitis Sister     Giant Cell Arteritis  . Ulcerative colitis Son    History  Substance Use Topics  . Smoking status: Never Smoker   . Smokeless tobacco: Not on file  . Alcohol Use: No   OB History   Grav Para Term Preterm Abortions TAB SAB Ect Mult Living                 Review of Systems  Constitutional: Negative for fever.  Respiratory: Negative for cough and shortness of breath.   Cardiovascular: Negative for chest pain.  Gastrointestinal: Positive for abdominal pain. Negative for nausea, vomiting and diarrhea.  Genitourinary: Positive for difficulty urinating. Negative for dysuria, urgency, frequency and vaginal bleeding.  Musculoskeletal: Positive for back pain.  All other systems reviewed and are negative.     Allergies  Cephalexin; Erythromycin; Terbinafine hcl; Colchicine; and Rosuvastatin  Home Medications   Prior to Admission medications   Medication Sig Start Date End Date Taking? Authorizing Provider  Biotin 1000 MCG tablet Take 1 tablet (1 mg total) by mouth daily. 10/21/12   Hendricks Limes, MD  cholecalciferol (VITAMIN D) 1000 UNITS tablet Take 1,000 Units by mouth daily.    Historical Provider,  MD  co-enzyme Q-10 30 MG capsule Take 30 mg by mouth daily.    Historical Provider, MD  COUMADIN 5 MG tablet TAKE 1 TABLET IN THE P.M. 04/21/13   Hendricks Limes, MD  ezetimibe-simvastatin (VYTORIN) 10-20 MG per tablet Take 1 tablet by mouth at bedtime. 02/01/13   Hendricks Limes, MD  furosemide (LASIX) 20 MG tablet TAKE 1 TABLET ONCE DAILY AS NEEDED FOR FLUID. 09/20/12   Deboraha Sprang, MD  Multiple Vitamin (MULTIVITAMIN WITH MINERALS) TABS Take 1 tablet by mouth daily.    Historical Provider, MD  saccharomyces boulardii (FLORASTOR) 250 MG capsule Take 250 mg by mouth daily.      Historical Provider, MD  SYNTHROID 125 MCG tablet TAKE (1/2) TABLET DAILY. 02/20/13    Hendricks Limes, MD   BP 158/83  Pulse 73  Temp(Src) 98.1 F (36.7 C) (Oral)  Resp 20  Ht 5\' 4"  (1.626 m)  Wt 160 lb (72.576 kg)  BMI 27.45 kg/m2  SpO2 99% Physical Exam  Nursing note and vitals reviewed. Constitutional: She appears well-developed and well-nourished. No distress.  HENT:  Head: Normocephalic and atraumatic.  Neck: Neck supple.  Cardiovascular: Normal rate and regular rhythm.   Pulmonary/Chest: Effort normal and breath sounds normal. No respiratory distress. She has no wheezes. She has no rales.  Abdominal: Soft. Bowel sounds are normal. She exhibits no distension and no mass. There is no tenderness. There is no rebound and no guarding.  Very mild suprapubic tenderness.  No masses.    Musculoskeletal:       Back:  Spine nontender, no crepitus, or stepoffs.   Neurological: She is alert.  Skin: She is not diaphoretic.    ED Course  Procedures (including critical care time) Labs Review Labs Reviewed  BASIC METABOLIC PANEL - Abnormal; Notable for the following:    GFR calc non Af Amer 58 (*)    GFR calc Af Amer 68 (*)    All other components within normal limits  PROTIME-INR - Abnormal; Notable for the following:    Prothrombin Time 25.7 (*)    INR 2.44 (*)    All other components within normal limits  URINE CULTURE  CBC WITH DIFFERENTIAL  URINALYSIS, ROUTINE W REFLEX MICROSCOPIC    Imaging Review Ct Abdomen Pelvis W Contrast  06/01/2013   CLINICAL DATA:  Lower abdominal pressure.  Right flank pain.  EXAM: CT ABDOMEN AND PELVIS WITH CONTRAST  TECHNIQUE: Multidetector CT imaging of the abdomen and pelvis was performed using the standard protocol following bolus administration of intravenous contrast.  CONTRAST:  76mL OMNIPAQUE IOHEXOL 300 MG/ML  SOLN  COMPARISON:  12/05/2009 and 11/02/2006  FINDINGS: Patient has a cardiac ICD. Few patchy densities at the lung bases are most compatible with atelectasis. No evidence for free intraperitoneal air.  There is a  stable 1.4 cm low-density structure along the right hepatic dome which is most compatible with a cyst. There is also a stable sub cm low-density structure at the left hepatic dome. The gallbladder has been removed. No significant biliary dilatation.  There is a subtle 0.8 cm low-density structure near the pancreatic head on sequence 3, image 34. A similar finding was present on the previous examinations and this could represent focal fat or a stable low-density structure. Similarly, there is a stable low-density or fatty structure on sequence 3, image 29. Overall, there has been minimal change in these findings and suspect these are related to benign etiologies. There is no significant pancreatic  duct dilatation. Normal appearance of the spleen, adrenal glands and both kidneys. Foley catheter within the urinary bladder. Evidence for ovarian tissue but the uterus has been removed. Atherosclerotic calcifications in the abdominal aorta without aneurysm. There is no significant free fluid or lymphadenopathy.  There is colonic diverticulosis involving the sigmoid colon but no acute inflammation. No gross abnormality to the small bowel.  Multilevel degenerative changes in the spine. Marked disc space narrowing at L5-S1.  IMPRESSION: No acute abnormalities within the abdomen or pelvis.  Colonic diverticulosis without acute inflammation.  Stable low-density structures in liver are most compatible with cysts. Subtle low-density structures in the pancreatic head as described. These have minimally changed since 2011 and suspect these are related to focal fat or a benign etiology.   Electronically Signed   By: Markus Daft M.D.   On: 06/01/2013 09:24     EKG Interpretation None      6:17 AM Discussed pt with Dr Lita Mains  7:31 AM Pt reports pain is now across entire lower abdomen but is improving in low back.  Less than 200cc in foley bag.  Labs, UA unremarkable.  Reexamination of abdomen: soft, nondistended, TTP  diffusely across lower abdomen, no guarding, no rebound.   10:01 AM Patient reports pain improved with medication.  Discussed all results including incidental CT results.  Per my discussion with Dr Lita Mains will remove foley catheter prior to discharge as patient actually not having urinary retention.    MDM   Final diagnoses:  Abdominal pain    Elderly afebrile, nontoxic female c/o urinary retention, lower abdominal and lower back discomfort.  Has had abdominal discomfort after urinating for approximately 1 week.  Thinks this all began and is related to prednisone she was taking for her shoulder - husband notes he saw this on the possible side effects list.   Labs, UA unremarkable.  INR back in appropriate range.  CT abd/pelvis ordered as pt experiencing increasing pain and pain that woke her from sleep.  No acute abnormalities on CT scan. No e/o infection, no ureteral calculi, no obstruction.  Urine sent for culture.  Mildly dehydrated clinically, IVF given.  Discussed results, findings, treatment, and follow up  with patient.  Pt given return precautions.  Pt verbalizes understanding and agrees with plan.      I doubt any other EMC precluding discharge at this time including, but not necessarily limited to the following: ischemic bowel, urinary retention.     Clayton Bibles, PA-C 06/01/13 1122

## 2013-06-01 NOTE — ED Notes (Signed)
Patient taken to CT.

## 2013-06-01 NOTE — ED Notes (Signed)
Report to [place], [place]

## 2013-06-02 ENCOUNTER — Telehealth: Payer: Self-pay | Admitting: Internal Medicine

## 2013-06-02 LAB — URINE CULTURE
Colony Count: NO GROWTH
Culture: NO GROWTH

## 2013-06-02 NOTE — Telephone Encounter (Signed)
Patient's husband called back and says that they mixed orange juice with olive oil and it "did the trick." He says that they will not need assistance at this time unless Linus Orn thinks it is necessary.

## 2013-06-02 NOTE — Telephone Encounter (Signed)
Patient's husband called and states that the patient is experiencing severe constipation. She has not had a bowel movement since Tuesday. They need to be advised on what to do. She is in a lot of pain. They went to the ER yesterday.

## 2013-06-03 ENCOUNTER — Ambulatory Visit: Payer: Medicare Other | Admitting: Internal Medicine

## 2013-06-05 NOTE — ED Provider Notes (Signed)
Medical screening examination/treatment/procedure(s) were performed by non-physician practitioner and as supervising physician I was immediately available for consultation/collaboration.   EKG Interpretation None        Julianne Rice, MD 06/05/13 [id number]

## 2013-06-06 ENCOUNTER — Encounter: Payer: Self-pay | Admitting: Internal Medicine

## 2013-06-06 ENCOUNTER — Ambulatory Visit (INDEPENDENT_AMBULATORY_CARE_PROVIDER_SITE_OTHER): Payer: Medicare Other | Admitting: *Deleted

## 2013-06-06 DIAGNOSIS — I429 Cardiomyopathy, unspecified: Secondary | ICD-10-CM

## 2013-06-06 LAB — MDC_IDC_ENUM_SESS_TYPE_REMOTE
Battery Voltage: 3.06 V
Brady Statistic AS VP Percent: 99.8 %
Lead Channel Impedance Value: 456 Ohm
Lead Channel Impedance Value: 494 Ohm
Lead Channel Pacing Threshold Amplitude: 0.625 V
Lead Channel Pacing Threshold Amplitude: 1.125 V
Lead Channel Pacing Threshold Pulse Width: 0.4 ms
Lead Channel Pacing Threshold Pulse Width: 0.8 ms
Lead Channel Sensing Intrinsic Amplitude: 0.9 mV
Lead Channel Setting Pacing Amplitude: 2.25 V
Lead Channel Setting Pacing Amplitude: 2.5 V
Lead Channel Setting Pacing Pulse Width: 0.4 ms
Lead Channel Setting Pacing Pulse Width: 0.8 ms
Lead Channel Setting Sensing Sensitivity: 0.3 mV
Zone Setting Detection Interval: 210 ms
Zone Setting Detection Interval: 250 ms
Zone Setting Detection Interval: 350 ms
Zone Setting Detection Interval: 360 ms
Zone Setting Detection Interval: 450 ms

## 2013-06-09 ENCOUNTER — Ambulatory Visit: Payer: Medicare Other | Admitting: Internal Medicine

## 2013-06-20 NOTE — Progress Notes (Signed)
ICD remote with ICM 

## 2013-06-22 ENCOUNTER — Encounter: Payer: Self-pay | Admitting: Cardiology

## 2013-07-05 ENCOUNTER — Ambulatory Visit (INDEPENDENT_AMBULATORY_CARE_PROVIDER_SITE_OTHER): Payer: Medicare Other | Admitting: General Practice

## 2013-07-05 DIAGNOSIS — Z5181 Encounter for therapeutic drug level monitoring: Secondary | ICD-10-CM

## 2013-07-05 DIAGNOSIS — I4891 Unspecified atrial fibrillation: Secondary | ICD-10-CM

## 2013-07-05 LAB — POCT INR: INR: 4.3

## 2013-07-05 NOTE — Progress Notes (Signed)
Pre visit review using our clinic review tool, if applicable. No additional management support is needed unless otherwise documented below in the visit note. 

## 2013-07-19 ENCOUNTER — Ambulatory Visit (INDEPENDENT_AMBULATORY_CARE_PROVIDER_SITE_OTHER): Payer: Medicare Other | Admitting: General Practice

## 2013-07-19 DIAGNOSIS — I4891 Unspecified atrial fibrillation: Secondary | ICD-10-CM

## 2013-07-19 DIAGNOSIS — Z5181 Encounter for therapeutic drug level monitoring: Secondary | ICD-10-CM

## 2013-07-19 LAB — POCT INR: INR: 2

## 2013-07-19 NOTE — Progress Notes (Signed)
Pre-visit discussion using our clinic review tool. No additional management support is needed unless otherwise documented below in the visit note.  

## 2013-07-20 ENCOUNTER — Other Ambulatory Visit: Payer: Self-pay | Admitting: Internal Medicine

## 2013-08-16 ENCOUNTER — Ambulatory Visit (INDEPENDENT_AMBULATORY_CARE_PROVIDER_SITE_OTHER): Payer: Medicare Other | Admitting: General Practice

## 2013-08-16 DIAGNOSIS — I4891 Unspecified atrial fibrillation: Secondary | ICD-10-CM

## 2013-08-16 DIAGNOSIS — Z5181 Encounter for therapeutic drug level monitoring: Secondary | ICD-10-CM

## 2013-08-16 LAB — POCT INR: INR: 2.8

## 2013-08-16 NOTE — Progress Notes (Signed)
Pre visit review using our clinic review tool, if applicable. No additional management support is needed unless otherwise documented below in the visit note. 

## 2013-09-09 ENCOUNTER — Telehealth: Payer: Self-pay | Admitting: Internal Medicine

## 2013-09-09 NOTE — Telephone Encounter (Signed)
She should take Immodium AD first ; it was prescription previously. Pepto Bismol is also effective If she has tried this ; Lomotil 2.5 1 prn WATERY stool # 6

## 2013-09-09 NOTE — Telephone Encounter (Signed)
Patient states she has diarrhea and would like Dr. Linna Darner to call something in for her to [facility].

## 2013-09-09 NOTE — Telephone Encounter (Signed)
Patient has been advised

## 2013-09-13 ENCOUNTER — Ambulatory Visit (INDEPENDENT_AMBULATORY_CARE_PROVIDER_SITE_OTHER): Payer: Medicare Other | Admitting: General Practice

## 2013-09-13 DIAGNOSIS — I4891 Unspecified atrial fibrillation: Secondary | ICD-10-CM

## 2013-09-13 DIAGNOSIS — Z5181 Encounter for therapeutic drug level monitoring: Secondary | ICD-10-CM

## 2013-09-13 LAB — POCT INR: INR: 2.9

## 2013-09-13 NOTE — Progress Notes (Signed)
Pre visit review using our clinic review tool, if applicable. No additional management support is needed unless otherwise documented below in the visit note. 

## 2013-09-16 ENCOUNTER — Other Ambulatory Visit (INDEPENDENT_AMBULATORY_CARE_PROVIDER_SITE_OTHER): Payer: Medicare Other

## 2013-09-16 ENCOUNTER — Encounter: Payer: Self-pay | Admitting: Internal Medicine

## 2013-09-16 ENCOUNTER — Ambulatory Visit (INDEPENDENT_AMBULATORY_CARE_PROVIDER_SITE_OTHER): Payer: Medicare Other | Admitting: Internal Medicine

## 2013-09-16 VITALS — BP 117/67 | HR 83 | Ht 63.5 in | Wt 159.0 lb

## 2013-09-16 VITALS — BP 100/78 | HR 88 | Temp 98.0°F | Wt 159.2 lb

## 2013-09-16 DIAGNOSIS — R8290 Unspecified abnormal findings in urine: Secondary | ICD-10-CM

## 2013-09-16 DIAGNOSIS — I509 Heart failure, unspecified: Secondary | ICD-10-CM

## 2013-09-16 DIAGNOSIS — R197 Diarrhea, unspecified: Secondary | ICD-10-CM

## 2013-09-16 DIAGNOSIS — I4891 Unspecified atrial fibrillation: Secondary | ICD-10-CM

## 2013-09-16 DIAGNOSIS — I429 Cardiomyopathy, unspecified: Secondary | ICD-10-CM

## 2013-09-16 DIAGNOSIS — R109 Unspecified abdominal pain: Secondary | ICD-10-CM

## 2013-09-16 DIAGNOSIS — R300 Dysuria: Secondary | ICD-10-CM

## 2013-09-16 DIAGNOSIS — I482 Chronic atrial fibrillation, unspecified: Secondary | ICD-10-CM

## 2013-09-16 DIAGNOSIS — R82998 Other abnormal findings in urine: Secondary | ICD-10-CM

## 2013-09-16 DIAGNOSIS — H1130 Conjunctival hemorrhage, unspecified eye: Secondary | ICD-10-CM

## 2013-09-16 DIAGNOSIS — H1132 Conjunctival hemorrhage, left eye: Secondary | ICD-10-CM

## 2013-09-16 DIAGNOSIS — Z95810 Presence of automatic (implantable) cardiac defibrillator: Secondary | ICD-10-CM

## 2013-09-16 LAB — MDC_IDC_ENUM_SESS_TYPE_INCLINIC
Brady Statistic AP VP Percent: 0 %
Brady Statistic AP VS Percent: 0 %
Brady Statistic AS VP Percent: 99.65 %
Brady Statistic AS VS Percent: 0.35 %
Brady Statistic RA Percent Paced: 0 %
Brady Statistic RV Percent Paced: 99.65 %
Date Time Interrogation Session: 20150731170814
HIGH POWER IMPEDANCE MEASURED VALUE: 380 Ohm
HIGH POWER IMPEDANCE MEASURED VALUE: 69 Ohm
HighPow Impedance: 190 Ohm
HighPow Impedance: 48 Ohm
Lead Channel Impedance Value: 1026 Ohm
Lead Channel Impedance Value: 456 Ohm
Lead Channel Impedance Value: 570 Ohm
Lead Channel Sensing Intrinsic Amplitude: 1.125 mV
Lead Channel Setting Pacing Amplitude: 2.5 V
Lead Channel Setting Sensing Sensitivity: 0.3 mV
MDC IDC MSMT BATTERY VOLTAGE: 3.02 V
MDC IDC MSMT LEADCHNL LV IMPEDANCE VALUE: 627 Ohm
MDC IDC MSMT LEADCHNL LV PACING THRESHOLD AMPLITUDE: 1 V
MDC IDC MSMT LEADCHNL LV PACING THRESHOLD PULSEWIDTH: 0.8 ms
MDC IDC MSMT LEADCHNL RA SENSING INTR AMPL: 1.375 mV
MDC IDC MSMT LEADCHNL RV IMPEDANCE VALUE: 494 Ohm
MDC IDC MSMT LEADCHNL RV PACING THRESHOLD AMPLITUDE: 0.625 V
MDC IDC MSMT LEADCHNL RV PACING THRESHOLD PULSEWIDTH: 0.4 ms
MDC IDC SET LEADCHNL LV PACING AMPLITUDE: 2 V
MDC IDC SET LEADCHNL LV PACING PULSEWIDTH: 0.8 ms
MDC IDC SET LEADCHNL RV PACING PULSEWIDTH: 0.4 ms
Zone Setting Detection Interval: 210 ms
Zone Setting Detection Interval: 250 ms
Zone Setting Detection Interval: 350 ms
Zone Setting Detection Interval: 360 ms
Zone Setting Detection Interval: 450 ms

## 2013-09-16 LAB — BASIC METABOLIC PANEL
BUN: 12 mg/dL (ref 6–23)
CO2: 26 mEq/L (ref 19–32)
Calcium: 9 mg/dL (ref 8.4–10.5)
Chloride: 98 mEq/L (ref 96–112)
Creatinine, Ser: 1.1 mg/dL (ref 0.4–1.2)
GFR: 50.01 mL/min — AB (ref >60.00)
Glucose, Bld: 95 mg/dL (ref 70–99)
Potassium: 4.1 mEq/L (ref 3.5–5.1)
Sodium: 134 mEq/L — ABNORMAL LOW (ref 135–145)

## 2013-09-16 LAB — POCT URINALYSIS DIPSTICK
Bilirubin, UA: NEGATIVE
Blood, UA: NEGATIVE
Glucose, UA: NEGATIVE
Ketones, UA: NEGATIVE
Nitrite, UA: NEGATIVE
PH UA: 6.5
Spec Grav, UA: 1.01
Urobilinogen, UA: 0.2

## 2013-09-16 LAB — CBC WITH DIFFERENTIAL/PLATELET
BASOS PCT: 0.3 % (ref 0.0–3.0)
Basophils Absolute: 0 10*3/uL (ref 0.0–0.1)
EOS ABS: 0 10*3/uL (ref 0.0–0.7)
Eosinophils Relative: 0.4 % (ref 0.0–5.0)
HEMATOCRIT: 43.4 % (ref 36.0–46.0)
HEMOGLOBIN: 14.4 g/dL (ref 12.0–15.0)
Lymphocytes Relative: 21.8 % (ref 12.0–46.0)
Lymphs Abs: 2.2 10*3/uL (ref 0.7–4.0)
MCHC: 33.2 g/dL (ref 30.0–36.0)
MCV: 92.2 fl (ref 78.0–100.0)
Monocytes Absolute: 0.6 10*3/uL (ref 0.1–1.0)
Monocytes Relative: 6.4 % (ref 3.0–12.0)
NEUTROS ABS: 7.1 10*3/uL (ref 1.4–7.7)
Neutrophils Relative %: 71.1 % (ref 43.0–77.0)
Platelets: 310 10*3/uL (ref 150.0–400.0)
RBC: 4.71 Mil/uL (ref 3.87–5.11)
RDW: 13.9 % (ref 11.5–15.5)
WBC: 10 10*3/uL (ref 4.0–10.5)

## 2013-09-16 LAB — PROTIME-INR
INR: 2.1 ratio — AB (ref 0.8–1.0)
Prothrombin Time: 22.8 s — ABNORMAL HIGH (ref 9.6–13.1)

## 2013-09-16 MED ORDER — NITROFURANTOIN MONOHYD MACRO 100 MG PO CAPS: 100.0000 mg | ORAL_CAPSULE | Freq: Two times a day (BID) | ORAL | Status: DC

## 2013-09-16 MED ORDER — PHENAZOPYRIDINE HCL 200 MG PO TABS: 200.0000 mg | ORAL_TABLET | Freq: Three times a day (TID) | ORAL | Status: DC | PRN

## 2013-09-16 NOTE — Progress Notes (Signed)
   Subjective:    Patient ID: Michele [name], female    DOB: 21-Jun-1929, 80 y.o.   MRN: [id number]  HPI The history is somewhat difficult to follow as it is somewhat variable.  She's had abdominal discomfort over the last 10 days. Her initial symptom was frank diarrhea. She took one dose of Imodium AD.She continued to have diarrheal type stools over 4 days. That has resolved; but she now describes some yellow mucoid stool. There is some burning rectally with passage of this.  Now she describes burning in the  Suprapubic area  And bilateral lower quadrant pain without radiation.  She describes urgency, dysuria, and urine flow as a "trickle".  She has had associated loss of appetite.  She's had cholecystectomy, partial hysterectomy, & appendectomy. Last colonoscopy was 07/16/2009. Polyps were removed; diverticulosis was present. That report was reviewed. She had a 4 mm sessile polyp in the cecum. She had moderate diverticulosis in the sigmoid colon. The procedure was performed to evaluate diarrhea.      Review of Systems   She denies nausea, vomiting, constipation, dyspepsia, dysphagia, hematemesis, or change in weight.  Additionally she has no fever, chills, or sweats.  There is no pyuria or hematuria . No rashes or associated skin lesions. No vaginal discharge or bleeding.  She has had hemorrhage in the left eye; her PT/INR was 2.9 on 7/24. She has no other bleeding symptoms.     Objective:   Physical Exam Significant or distinguishing  findings on physical exam include: She appears well-nourished and in no acute distress. There is hemorrhage in the lateral aspect of the left eye She has some tenderness to deep palpation over the lower abdomen in the suprapubic and lower quadrant areas. She does have some tenting. Clinically there appears be some difficulty focusing on questions as her answers do not always pertain to the question. For example it was difficult to pinpoint the  fact that she had diarrhea for 4 days but only took only dose of Imodium AD.  General appearance :adequately nourished; in no distress. Eyes: No conjunctival inflammation or scleral icterus is present. Oral exam: Dental hygiene is good. Lips and gums are healthy appearing.There is no oropharyngeal erythema or exudate noted.  Heart:  Normal rate and regular rhythm. S1 and S2 normal without gallop, murmur, click, rub or other extra sounds   Lungs:Chest clear to auscultation; no wheezes, rhonchi,rales ,or rubs present.No increased work of breathing.  Abdomen: bowel sounds normal, soft without masses, organomegaly or hernias noted.  No guarding or rebound. No flank tenderness to percussion. Skin:Warm & dry.  Intact without suspicious lesions or rashes ; no jaundice . Lymphatic: No lymphadenopathy is noted about the head, neck, axilla         Assessment & Plan:  #1 acute diarrhea for 4 days, resolved  #2 mucoid stool without melena or rectal bleeding  #3 dysuria, urgency and oliguria. Urinalysis shows proteinuria as well as leukocytes  #4 scleral hemorrhage  Plan: See orders and recommendations

## 2013-09-16 NOTE — Progress Notes (Signed)
   Subjective:    Patient ID: Michele [name], female    DOB: [birth year], 80 y.o.   MRN: [id number]  HPI  Patient is here today about abdominal pain. This began again about 10 days ago, is worsening, characterizes it as "on fire" it is suprapubic and very low bilaterally without radiation to other structures. She has not been exposed to anyone who is sick, not using NSAIDs, not eating spicy foods, not using caffeine or alcoholic beverages, not taking peppermint or menthol products.   She is having the pain when defecating and it reaches 10/10. When she is at rest it is a 5-6/10 in severity. She had a BM this am and it was bright yellow and "creamy" and also has clear mucous with a BM each time. She had black/tarry stools once about 2 months ago.   Her last colonoscopy was 06/19/2009 with Dr. Fuller Plan. Patient states she has diverticulosis and had polyps removed.   She tried Immodium AD once without relief.   Today she complains of dysuria, urgency, and stream that is a trickle.    Past Surgeries include: cholecystectomy, partial hysterectomy, appendectomy, ICD implantation.   Her left eye is diffusely dark red on the lateral side of the eyeball. She states this happens to her occasionally and it resolves with time. Last eye Dr. Appointment was 3 weeks ago with no changes.    Review of Systems Positive for loss of appetite, dysuria, urgency, slight urinary stream,   Denies nausea vomiting, diarrhea, constipation, food sticking in the throat, heart bearn, vomiting blood, weight loss/gain in the last 30 days, fever, chills, sweats, pus/blood in urine/stool, pain that radiates.  Denies frequency, nocturia,  Denies vaginal discharge or bleeding.     Objective:   Physical Exam        Assessment & Plan:

## 2013-09-16 NOTE — Patient Instructions (Signed)
Drink as much nondairy fluids as possible. Avoid spicy foods or alcohol as  these may aggravate the bladder. Do not take decongestants. Avoid narcotics if possible. 

## 2013-09-16 NOTE — Patient Instructions (Signed)
Remote monitoring is used to monitor your  ICD from home. This monitoring reduces the number of office visits required to check your device to one time per year. It allows [place] to keep an eye on the functioning of your device to ensure it is working properly. You are scheduled for a device check from home on 12-19-2013. You may send your transmission at any time that day. If you have a wireless device, the transmission will be sent automatically. After your physician reviews your transmission, you will receive a postcard with your next transmission date.  Your physician recommends that you schedule a follow-up appointment in: 12 months with Dr.Klein

## 2013-09-16 NOTE — Progress Notes (Signed)
Patient Care Team: Hendricks Limes, MD as PCP - General   HPI  Michele [name] is a 80 y.o. 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   Echo 2010 EF 55-60% improved from 25-30%  .  She has done quite well.   She is currently doing quite well. Her husband is recovering from his surgery  Past Medical History  Diagnosis Date  . Cardiomyopathy     RATE RELATED  . Diverticulosis     DIVERTICULITIS  . C. difficile colitis   . Atrial fibrillation     NEG STRESS CARDIOLITE  . Gilbert's syndrome   . Hyperlipidemia     NMR LIOPROFILE LDL 234(3206/2234)HDL 37,TG86  . Thyroid disease     HYPOTHYROIDISM...THYROID NODULES  . Diverticulitis     PMH of X 2  . Hx of colonic polyp     Past Surgical History  Procedure Laterality Date  . Biopsy thyroid      SINGLE NODULE  . Throidectomy 12/08      benign  . Appendectomy  1956  . Cholecystectomy  1972  . Abdominal hysterectomy  1973  . Pacemaker placement      10/2006  . Icd....02/2008    . Breast biopsy  1997  . Rotator cuff repair      x2  . Colonoscopy with polypectomy       X1; negative study 2012  . Cardiac defibrillator placement      Current Outpatient Prescriptions  Medication Sig Dispense Refill  . cholecalciferol (VITAMIN D) 1000 UNITS tablet Take 1,000 Units by mouth daily.      Marland Kitchen co-enzyme Q-10 30 MG capsule Take 30 mg by mouth daily.      Marland Kitchen ezetimibe-simvastatin (VYTORIN) 10-20 MG per tablet Take 1 tablet by mouth at bedtime.  30 tablet  5  . Multiple Vitamin (MULTIVITAMIN WITH MINERALS) TABS Take 1 tablet by mouth daily.      . nitrofurantoin, macrocrystal-monohydrate, (MACROBID) 100 MG capsule Take 1 capsule (100 mg total) by mouth 2 (two) times daily.  14 capsule  0  . phenazopyridine (PYRIDIUM) 200 MG tablet Take 1 tablet (200 mg total)  by mouth 3 (three) times daily as needed for pain.  10 tablet  0  . saccharomyces boulardii (FLORASTOR) 250 MG capsule Take 250 mg by mouth daily.        Marland Kitchen SYNTHROID 125 MCG tablet TAKE (1/2) TABLET DAILY.  45 tablet  3  . warfarin (COUMADIN) 5 MG tablet Take 5 mg by mouth every evening.       No current facility-administered medications for this visit.    Allergies  Allergen Reactions  . Cephalexin Hives  . Erythromycin Swelling and Other (See Comments)    REACTION: swollen and sore mouth  . Terbinafine Hcl     Rash   . Colchicine Other (See Comments)    REACTION: violent diarrhea  . Rosuvastatin Other (See Comments)    REACTION: upper arm pain bilaterally    Review of Systems negative except from HPI and PMH  Physical Exam BP 117/67  Pulse 83  Ht 5' 3.5" (1.613 m)  Wt 159 lb (72.122 kg)  BMI 27.72 kg/m2 Well developed and nourished in no acute distress HENT normal Neck supple with JVP-flat Clear Regular rate and rhythm, no murmurs or gallops Device pocket well healed; without hematoma or erythema.  There is no tethering  Abd-soft with active BS No Clubbing cyanosis edema Skin-warm and dry A & Oriented  Grossly normal sensory and motor function  ECG demonstrates ventricular pacing with underlying atrial fibrillation   Assessment and  Plan Atrial fibrillation-permanent  Nonischemic cardiomyopathy -resolved  Implantable defibrillator-biventricular-[employer] The patient's device was interrogated.  The information was reviewed. No changes were made in the programming.       She remains on anticoagulation.  Euvolemic continue current meds  Continue current meds

## 2013-09-16 NOTE — Progress Notes (Signed)
Pre visit review using our clinic review tool, if applicable. No additional management support is needed unless otherwise documented below in the visit note. 

## 2013-09-18 LAB — URINE CULTURE: Colony Count: 50000

## 2013-09-19 ENCOUNTER — Telehealth: Payer: Self-pay

## 2013-09-19 DIAGNOSIS — R300 Dysuria: Secondary | ICD-10-CM

## 2013-09-19 MED ORDER — NITROFURANTOIN MONOHYD MACRO 100 MG PO CAPS: 100.0000 mg | ORAL_CAPSULE | Freq: Two times a day (BID) | ORAL | Status: DC

## 2013-09-19 NOTE — Telephone Encounter (Signed)
Message copied by Shelly Coss on Mon Sep 19, 2013  9:17 AM ------      Message from: Hendricks Limes      Created: Sun Sep 18, 2013  7:57 AM       Rx called to [facility] 09/18/13 ------

## 2013-09-19 NOTE — Telephone Encounter (Signed)
Antibiotic electronically sent

## 2013-10-11 ENCOUNTER — Ambulatory Visit (INDEPENDENT_AMBULATORY_CARE_PROVIDER_SITE_OTHER): Payer: Medicare Other | Admitting: *Deleted

## 2013-10-11 DIAGNOSIS — Z5181 Encounter for therapeutic drug level monitoring: Secondary | ICD-10-CM

## 2013-10-11 DIAGNOSIS — I4891 Unspecified atrial fibrillation: Secondary | ICD-10-CM

## 2013-10-11 LAB — POCT INR: INR: 2.8

## 2013-10-18 ENCOUNTER — Emergency Department (HOSPITAL_COMMUNITY): Payer: Medicare Other

## 2013-10-18 ENCOUNTER — Encounter (HOSPITAL_COMMUNITY): Payer: Self-pay | Admitting: Emergency Medicine

## 2013-10-18 ENCOUNTER — Emergency Department (HOSPITAL_COMMUNITY)
Admission: EM | Admit: 2013-10-18 | Discharge: 2013-10-18 | Disposition: A | Discharge status: Home / Self Care | Payer: Medicare Other | Attending: Emergency Medicine | Admitting: Emergency Medicine

## 2013-10-18 DIAGNOSIS — Z8719 Personal history of other diseases of the digestive system: Secondary | ICD-10-CM | POA: Insufficient documentation

## 2013-10-18 DIAGNOSIS — E785 Hyperlipidemia, unspecified: Secondary | ICD-10-CM | POA: Insufficient documentation

## 2013-10-18 DIAGNOSIS — Z950 Presence of cardiac pacemaker: Secondary | ICD-10-CM | POA: Insufficient documentation

## 2013-10-18 DIAGNOSIS — Z860100 Personal history of colon polyps, unspecified: Secondary | ICD-10-CM | POA: Insufficient documentation

## 2013-10-18 DIAGNOSIS — I4891 Unspecified atrial fibrillation: Secondary | ICD-10-CM | POA: Insufficient documentation

## 2013-10-18 DIAGNOSIS — IMO0002 Reserved for concepts with insufficient information to code with codable children: Secondary | ICD-10-CM | POA: Insufficient documentation

## 2013-10-18 DIAGNOSIS — R51 Headache: Secondary | ICD-10-CM

## 2013-10-18 DIAGNOSIS — Z792 Long term (current) use of antibiotics: Secondary | ICD-10-CM | POA: Diagnosis not present

## 2013-10-18 DIAGNOSIS — S0990XA Unspecified injury of head, initial encounter: Secondary | ICD-10-CM | POA: Diagnosis not present

## 2013-10-18 DIAGNOSIS — Y9289 Other specified places as the place of occurrence of the external cause: Secondary | ICD-10-CM | POA: Diagnosis not present

## 2013-10-18 DIAGNOSIS — Z95810 Presence of automatic (implantable) cardiac defibrillator: Secondary | ICD-10-CM | POA: Insufficient documentation

## 2013-10-18 DIAGNOSIS — E079 Disorder of thyroid, unspecified: Secondary | ICD-10-CM | POA: Insufficient documentation

## 2013-10-18 DIAGNOSIS — Z7901 Long term (current) use of anticoagulants: Secondary | ICD-10-CM | POA: Diagnosis not present

## 2013-10-18 DIAGNOSIS — Z8619 Personal history of other infectious and parasitic diseases: Secondary | ICD-10-CM | POA: Insufficient documentation

## 2013-10-18 DIAGNOSIS — Z79899 Other long term (current) drug therapy: Secondary | ICD-10-CM | POA: Insufficient documentation

## 2013-10-18 DIAGNOSIS — Y9389 Activity, other specified: Secondary | ICD-10-CM | POA: Diagnosis not present

## 2013-10-18 DIAGNOSIS — R519 Headache, unspecified: Secondary | ICD-10-CM

## 2013-10-18 LAB — BASIC METABOLIC PANEL
ANION GAP: 12 (ref 5–15)
BUN: 15 mg/dL (ref 6–23)
CHLORIDE: 102 meq/L (ref 96–112)
CO2: 26 meq/L (ref 19–32)
Calcium: 9 mg/dL (ref 8.4–10.5)
Creatinine, Ser: 0.95 mg/dL (ref 0.50–1.10)
GFR calc non Af Amer: 54 mL/min — ABNORMAL LOW (ref >90)
GFR, EST AFRICAN AMERICAN: 63 mL/min — AB (ref >90)
Glucose, Bld: 96 mg/dL (ref 70–99)
POTASSIUM: 4.2 meq/L (ref 3.7–5.3)
Sodium: 140 mEq/L (ref 137–147)

## 2013-10-18 LAB — CBC WITH DIFFERENTIAL/PLATELET
BASOS PCT: 0 % (ref 0–1)
Basophils Absolute: 0 10*3/uL (ref 0.0–0.1)
Eosinophils Absolute: 0.1 10*3/uL (ref 0.0–0.7)
Eosinophils Relative: 1 % (ref 0–5)
HEMATOCRIT: 38.5 % (ref 36.0–46.0)
Hemoglobin: 13.2 g/dL (ref 12.0–15.0)
Lymphocytes Relative: 33 % (ref 12–46)
Lymphs Abs: 2.1 10*3/uL (ref 0.7–4.0)
MCH: 30.3 pg (ref 26.0–34.0)
MCHC: 34.3 g/dL (ref 30.0–36.0)
MCV: 88.3 fL (ref 78.0–100.0)
Monocytes Absolute: 0.5 10*3/uL (ref 0.1–1.0)
Monocytes Relative: 8 % (ref 3–12)
NEUTROS ABS: 3.6 10*3/uL (ref 1.7–7.7)
NEUTROS PCT: 58 % (ref 43–77)
Platelets: 193 10*3/uL (ref 150–400)
RBC: 4.36 MIL/uL (ref 3.87–5.11)
RDW: 14.1 % (ref 11.5–15.5)
WBC: 6.2 10*3/uL (ref 4.0–10.5)

## 2013-10-18 LAB — PROTIME-INR
INR: 1.59 — AB (ref 0.00–1.49)
Prothrombin Time: 19 seconds — ABNORMAL HIGH (ref 11.6–15.2)

## 2013-10-18 MED ORDER — ACETAMINOPHEN 325 MG PO TABS
650.0000 mg | ORAL_TABLET | Freq: Once | ORAL | Status: AC
  Administered 2013-10-18: 650 mg via ORAL
  Filled 2013-10-18: qty 2

## 2013-10-18 NOTE — ED Provider Notes (Signed)
CSN: [id number]     Arrival date & time 10/18/13  0946 History   First MD Initiated Contact with Patient 10/18/13 1050     Chief Complaint  Patient presents with  . Headache     (Consider location/radiation/quality/duration/timing/severity/associated sxs/prior Treatment) HPI Comments: Patient presents today with a chief complaint of headache.  She states that she hit her head on the refrigerator door 5 days ago and has had a constant headache since that time.  She reports that she was bent over to get some food and then when she stood up hit her head on the refrigerator.  She did not fall.  No LOC.  She reports that the headache is located on the top of her head.  She reports that she has been having intermittent pain of the neck that last a few seconds at a time.  She denies numbness, tingling, weakness, nausea, vomiting, or vision changes. She has not taken anything for headache prior to arrival.  She is currently on Coumadin for history of Atrial Fibrillation.  The history is provided by the patient.    Past Medical History  Diagnosis Date  . Cardiomyopathy     RATE RELATED  . Diverticulosis     DIVERTICULITIS  . C. difficile colitis   . Atrial fibrillation     NEG STRESS CARDIOLITE  . Gilbert's syndrome   . Hyperlipidemia     NMR LIOPROFILE LDL 234(3206/2234)HDL 37,TG86  . Thyroid disease     HYPOTHYROIDISM...THYROID NODULES  . Diverticulitis     PMH of X 2  . Hx of colonic polyp    Past Surgical History  Procedure Laterality Date  . Biopsy thyroid      SINGLE NODULE  . Throidectomy 12/08      benign  . Appendectomy  1956  . Cholecystectomy  1972  . Abdominal hysterectomy  1973  . Pacemaker placement      10/2006  . Icd....02/2008    . Breast biopsy  1997  . Rotator cuff repair      x2  . Colonoscopy with polypectomy       X1; negative study 2012  . Cardiac defibrillator placement     Family History  Problem Relation Age of Onset  . Heart attack Mother 56  .  Heart attack Brother 76  . Lung disease Father     Silicosis  . Vasculitis Sister     Giant Cell Arteritis  . Ulcerative colitis Son    History  Substance Use Topics  . Smoking status: Never Smoker   . Smokeless tobacco: Not on file  . Alcohol Use: No   OB History   Grav Para Term Preterm Abortions TAB SAB Ect Mult Living                 Review of Systems  All other systems reviewed and are negative.     Allergies  Cephalexin; Erythromycin; Terbinafine hcl; Colchicine; and Rosuvastatin  Home Medications   Prior to Admission medications   Medication Sig Start Date End Date Taking? Authorizing Provider  cholecalciferol (VITAMIN D) 1000 UNITS tablet Take 1,000 Units by mouth daily.    Historical Provider, MD  co-enzyme Q-10 30 MG capsule Take 30 mg by mouth daily.    Historical Provider, MD  ezetimibe-simvastatin (VYTORIN) 10-20 MG per tablet Take 1 tablet by mouth at bedtime. 02/01/13   Hendricks Limes, MD  Multiple Vitamin (MULTIVITAMIN WITH MINERALS) TABS Take 1 tablet by mouth daily.  Historical Provider, MD  nitrofurantoin, macrocrystal-monohydrate, (MACROBID) 100 MG capsule Take 1 capsule (100 mg total) by mouth 2 (two) times daily. 09/19/13   Hendricks Limes, MD  phenazopyridine (PYRIDIUM) 200 MG tablet Take 1 tablet (200 mg total) by mouth 3 (three) times daily as needed for pain. 09/16/13   Hendricks Limes, MD  saccharomyces boulardii (FLORASTOR) 250 MG capsule Take 250 mg by mouth daily.      Historical Provider, MD  SYNTHROID 125 MCG tablet TAKE (1/2) TABLET DAILY. 07/20/13   Hendricks Limes, MD  warfarin (COUMADIN) 5 MG tablet Take 5 mg by mouth every evening.    Historical Provider, MD   BP 148/80  Pulse 70  Temp(Src) 97.3 F (36.3 C) (Oral)  Resp 16  SpO2 97% Physical Exam  Nursing note and vitals reviewed. Constitutional: She is oriented to person, place, and time. She appears well-developed and well-nourished.  HENT:  Head: Normocephalic and  atraumatic.  Mouth/Throat: Oropharynx is clear and moist.  Eyes: EOM are normal. Pupils are equal, round, and reactive to light.  Neck: Normal range of motion. Neck supple.  Cardiovascular: Normal rate, regular rhythm and normal heart sounds.   Pulmonary/Chest: Effort normal and breath sounds normal.  Musculoskeletal: Normal range of motion.       Cervical back: She exhibits normal range of motion, no tenderness, no bony tenderness, no swelling, no edema and no deformity.       Thoracic back: She exhibits normal range of motion, no tenderness, no bony tenderness, no swelling, no edema and no deformity.       Lumbar back: She exhibits normal range of motion, no tenderness, no bony tenderness, no swelling, no edema and no deformity.  Full ROM of UE and LE bilaterally  Neurological: She is alert and oriented to person, place, and time. She has normal strength. No cranial nerve deficit or sensory deficit. Gait normal.  Skin: Skin is warm and dry.    ED Course  Procedures (including critical care time) Labs Review Labs Reviewed  CBC WITH DIFFERENTIAL  BASIC METABOLIC PANEL  PROTIME-INR    Imaging Review Ct Head Wo Contrast  10/18/2013   CLINICAL DATA:  Head injury  EXAM: CT HEAD WITHOUT CONTRAST  TECHNIQUE: Contiguous axial images were obtained from the base of the skull through the vertex without intravenous contrast.  COMPARISON:  02/20/2011  FINDINGS: No skull fracture is noted. Paranasal sinuses and mastoid air cells are unremarkable. Stable atrophy and chronic white matter disease. No intracranial hemorrhage, mass effect or midline shift. No acute infarction. No mass lesion is noted on this unenhanced scan. The gray and white-matter differentiation is preserved.  IMPRESSION: No acute intracranial abnormality. Stable atrophy and chronic white matter disease.   Electronically Signed   By: Lahoma Crocker M.D.   On: 10/18/2013 12:18     EKG Interpretation None      MDM   Final diagnoses:   None   Patient presenting with headache that has been constant since minor head trauma 5 days ago.  Patient with normal neurological exam.  Afebrile, no nuchal rigidity.  Patient currently on Coumadin for Atrial Fibrillation.  No acute intracranial abnormality on Head CT.  Pain improved after given Tylenol.  Patient stable for discharge.  Return precautions given.  Patient also evaluated by Dr. Venora Maples who also evaluated the patient.    Hyman Bible, PA-C 10/18/13 1606

## 2013-10-18 NOTE — ED Provider Notes (Signed)
Medical screening examination/treatment/procedure(s) were conducted as a shared visit with non-physician practitioner(s) and myself.  I personally evaluated the patient during the encounter.   EKG Interpretation None      Minor head trauma on Coumadin.  CT head negative.  Patient well appearing.  Discharge home in good condition.  Ct Head Wo Contrast  10/18/2013   CLINICAL DATA:  Head injury  EXAM: CT HEAD WITHOUT CONTRAST  TECHNIQUE: Contiguous axial images were obtained from the base of the skull through the vertex without intravenous contrast.  COMPARISON:  02/20/2011  FINDINGS: No skull fracture is noted. Paranasal sinuses and mastoid air cells are unremarkable. Stable atrophy and chronic white matter disease. No intracranial hemorrhage, mass effect or midline shift. No acute infarction. No mass lesion is noted on this unenhanced scan. The gray and white-matter differentiation is preserved.  IMPRESSION: No acute intracranial abnormality. Stable atrophy and chronic white matter disease.   Electronically Signed   By: Lahoma Crocker M.D.   On: 10/18/2013 12:18  I personally reviewed the imaging tests through PACS system I reviewed available ER/hospitalization records through the [facility], MD 10/18/13 [phone]

## 2013-10-18 NOTE — ED Notes (Signed)
Patient transported to CT 

## 2013-10-18 NOTE — ED Notes (Signed)
Pt presents to department for evaluation of head injury. States she struck head on freezer last Thursday, now states continued pain radiating down to neck. 5/10 pain upon arrival to ED. Pt is alert and oriented x4. Currently taking Coumadin, and has ICD. Ambulatory without difficulty.

## 2013-10-28 ENCOUNTER — Other Ambulatory Visit: Payer: Self-pay | Admitting: Internal Medicine

## 2013-10-28 NOTE — Telephone Encounter (Signed)
Is the patient supposed to still be on the furosemide? Please advise. Thanks, MI

## 2013-10-31 NOTE — Telephone Encounter (Signed)
Left message for patient to call me to discuss.

## 2013-10-31 NOTE — Telephone Encounter (Signed)
Yes, ok to refill prn medication.  Seems it was deleted after hospitalization in April but not actually stopped. Rx sent to [facility]

## 2013-11-08 ENCOUNTER — Encounter (INDEPENDENT_AMBULATORY_CARE_PROVIDER_SITE_OTHER): Payer: Medicare Other | Admitting: *Deleted

## 2013-11-08 ENCOUNTER — Ambulatory Visit: Payer: Medicare Other | Admitting: *Deleted

## 2013-11-08 DIAGNOSIS — I4891 Unspecified atrial fibrillation: Secondary | ICD-10-CM

## 2013-11-08 DIAGNOSIS — Z23 Encounter for immunization: Secondary | ICD-10-CM

## 2013-11-08 LAB — POCT INR: INR: 2.6

## 2013-11-09 ENCOUNTER — Ambulatory Visit: Payer: Medicare Other

## 2013-11-09 ENCOUNTER — Ambulatory Visit (INDEPENDENT_AMBULATORY_CARE_PROVIDER_SITE_OTHER): Payer: Medicare Other | Admitting: *Deleted

## 2013-11-09 DIAGNOSIS — Z5181 Encounter for therapeutic drug level monitoring: Secondary | ICD-10-CM

## 2013-12-02 ENCOUNTER — Other Ambulatory Visit: Payer: Self-pay | Admitting: Internal Medicine

## 2013-12-19 ENCOUNTER — Encounter: Payer: Self-pay | Admitting: Internal Medicine

## 2013-12-19 ENCOUNTER — Telehealth: Payer: Self-pay | Admitting: Cardiology

## 2013-12-19 ENCOUNTER — Ambulatory Visit (INDEPENDENT_AMBULATORY_CARE_PROVIDER_SITE_OTHER): Payer: Medicare Other | Admitting: *Deleted

## 2013-12-19 DIAGNOSIS — I429 Cardiomyopathy, unspecified: Secondary | ICD-10-CM

## 2013-12-19 LAB — MDC_IDC_ENUM_SESS_TYPE_REMOTE
Battery Voltage: 3.02 V
Brady Statistic AS VP Percent: 99.94 %
Brady Statistic RA Percent Paced: 0 %
Date Time Interrogation Session: 20151102195721
HIGH POWER IMPEDANCE MEASURED VALUE: 323 Ohm
HIGH POWER IMPEDANCE MEASURED VALUE: 44 Ohm
HighPow Impedance: 59 Ohm
Lead Channel Impedance Value: 437 Ohm
Lead Channel Impedance Value: 437 Ohm
Lead Channel Impedance Value: 532 Ohm
Lead Channel Pacing Threshold Amplitude: 0.75 V
Lead Channel Pacing Threshold Amplitude: 1.375 V
Lead Channel Pacing Threshold Pulse Width: 0.4 ms
Lead Channel Pacing Threshold Pulse Width: 0.8 ms
Lead Channel Sensing Intrinsic Amplitude: 2.375 mV
Lead Channel Sensing Intrinsic Amplitude: 2.375 mV
Lead Channel Setting Pacing Amplitude: 2.5 V
Lead Channel Setting Pacing Pulse Width: 0.8 ms
Lead Channel Setting Sensing Sensitivity: 0.3 mV
MDC IDC MSMT LEADCHNL LV IMPEDANCE VALUE: 494 Ohm
MDC IDC MSMT LEADCHNL LV IMPEDANCE VALUE: 855 Ohm
MDC IDC SET LEADCHNL RV PACING AMPLITUDE: 2.5 V
MDC IDC SET LEADCHNL RV PACING PULSEWIDTH: 0.4 ms
MDC IDC SET ZONE DETECTION INTERVAL: 350 ms
MDC IDC SET ZONE DETECTION INTERVAL: 450 ms
MDC IDC STAT BRADY AP VP PERCENT: 0 %
MDC IDC STAT BRADY AP VS PERCENT: 0 %
MDC IDC STAT BRADY AS VS PERCENT: 0.06 %
MDC IDC STAT BRADY RV PERCENT PACED: 99.94 %
Zone Setting Detection Interval: 210 ms
Zone Setting Detection Interval: 250 ms
Zone Setting Detection Interval: 360 ms

## 2013-12-19 NOTE — Telephone Encounter (Signed)
Spoke with pt and reminded pt of remote transmission that is due today. Pt verbalized understanding.   

## 2013-12-19 NOTE — Progress Notes (Signed)
Remote ICD transmission.   

## 2013-12-22 ENCOUNTER — Ambulatory Visit (INDEPENDENT_AMBULATORY_CARE_PROVIDER_SITE_OTHER): Payer: Medicare Other | Admitting: Pharmacist Clinician (PhC)/ Clinical Pharmacy Specialist

## 2013-12-22 ENCOUNTER — Ambulatory Visit: Payer: Medicare Other | Admitting: Family

## 2013-12-22 DIAGNOSIS — Z5181 Encounter for therapeutic drug level monitoring: Secondary | ICD-10-CM

## 2013-12-22 LAB — POCT INR: INR: 1.6

## 2014-01-05 ENCOUNTER — Encounter: Payer: Self-pay | Admitting: Cardiology

## 2014-01-05 ENCOUNTER — Ambulatory Visit (INDEPENDENT_AMBULATORY_CARE_PROVIDER_SITE_OTHER): Payer: Medicare Other | Admitting: Pharmacist Clinician (PhC)/ Clinical Pharmacy Specialist

## 2014-01-05 DIAGNOSIS — Z5181 Encounter for therapeutic drug level monitoring: Secondary | ICD-10-CM

## 2014-01-05 DIAGNOSIS — I4891 Unspecified atrial fibrillation: Secondary | ICD-10-CM

## 2014-01-05 LAB — POCT INR: INR: 1.5

## 2014-01-10 ENCOUNTER — Ambulatory Visit (INDEPENDENT_AMBULATORY_CARE_PROVIDER_SITE_OTHER): Payer: Medicare Other | Admitting: Internal Medicine

## 2014-01-10 ENCOUNTER — Encounter: Payer: Self-pay | Admitting: Internal Medicine

## 2014-01-10 VITALS — BP 120/80 | HR 89 | Temp 97.5°F | Resp 14 | Ht 62.5 in | Wt 154.1 lb

## 2014-01-10 DIAGNOSIS — Z0000 Encounter for general adult medical examination without abnormal findings: Secondary | ICD-10-CM

## 2014-01-10 DIAGNOSIS — D126 Benign neoplasm of colon, unspecified: Secondary | ICD-10-CM

## 2014-01-10 DIAGNOSIS — E785 Hyperlipidemia, unspecified: Secondary | ICD-10-CM

## 2014-01-10 DIAGNOSIS — E890 Postprocedural hypothyroidism: Secondary | ICD-10-CM

## 2014-01-10 NOTE — Patient Instructions (Signed)
Your next office appointment will be determined based upon review of your pending labs. Those instructions will be transmitted to you through My Chart . 

## 2014-01-10 NOTE — Assessment & Plan Note (Signed)
NMR Lipids, LFTs, TSH ,CK

## 2014-01-10 NOTE — Progress Notes (Signed)
Subjective:    Patient ID: Michele [name], female    DOB: 1929-12-14, 80 y.o.   MRN: [id number]  HPI UHC/Medicare Wellness Visit: Psychosocial and medical history were reviewed as required by Medicare (history related to abuse, antisocial behavior , firearm risk). Social history: Caffeine: none  , Alcohol: rarely , Tobacco use:no Exercise:see below Personal safety/fall risk:no Limitations of activities of daily living:no Seatbelt/ smoke alarm use:yes Healthcare Power of Attorney/Living Will status: UTD Ophthalmologic exam status: UTD Hearing evaluation status: not UTD Orientation: Oriented X 3 Memory and recall: good Spelling testing: good Depression/anxiety assessment: no Foreign travel history:[place] May 2015 Immunization status for influenza/pneumonia/ shingles /tetanus:Shingles needed Transfusion history:no Preventive health care maintenance status: Colonoscopy/BMD/mammogram/Pap as per protocol/standard care: ? Aged out (see below) Dental care: every 6 mos Chart reviewed and updated. Active issues reviewed and addressed as documented below.    She's been compliant with her medications with no adverse effects  There's been no change in the dose, brand, mode of administration of the thyroid.  She is on a heart healthy diet. She is unable to exercise as her husband has advanced cardiovascular disease. Advanced cholesterol testing reveals her LDL goal is less than 100, ideally less than 70.  There is no premature family history of coronary disease.  She had a tubular adenoma removed in 2011; follow-up has not been defined. GI review systems is negative except for occasional loose stool.  Review of Systems   Significant headaches, epistaxis, chest pain, palpitations, exertional dyspnea, claudication, paroxysmal nocturnal dyspnea, or edema absent. No significant memory loss or myalgias. She describes hair loss. Some weight loss gradually. No change in appetite. No  blurred, double vision ,loss of vision No constipation Nails brittle.No numbness or tingling; tremor Temperature intolerance to heat .         Objective:   Physical Exam Gen.: Healthy and well-nourished in appearance. Alert, appropriate and cooperative throughout exam. Appears younger than stated age  Head: Normocephalic without obvious abnormalities;hair fine  Eyes: No corneal or conjunctival inflammation noted. Pupils equal round reactive to light and accommodation. Extraocular motion intact. Ears: External  ear exam reveals no significant lesions or deformities.[place] on [place]. Hearing is grossly normal dbilaterally. Nose: External nasal exam reveals no deformity or inflammation. Nasal mucosa are pink and moist. No lesions or exudates noted.   Mouth: Oral mucosa and oropharynx reveal no lesions or exudates. Teeth in good repair. Neck: No deformities, masses, or tenderness noted. Range of motion good. Thyroid absent Lungs: Normal respiratory effort; chest expands symmetrically. Lungs are clear to auscultation without rales, wheezes, or increased work of breathing. Heart: Normal rate and rhythm. Normal S1 and S2. No gallop, click, or rub. No murmur. Abdomen: Bowel sounds normal; abdomen soft and nontender. No masses, organomegaly or hernias noted. Genitalia:  as per Gyn                                  Musculoskeletal/extremities: slight accentuated curvature of upper thoracic spine. No clubbing, cyanosis, edema, or significant extremity  deformity noted. Range of motion normal .Tone & strength normal. Hand joints normal  Fingernail health good. Able to lie down & sit up w/o help. Negative SLR bilaterally Vascular: Carotid, radial artery, dorsalis pedis and  posterior tibial pulses are equal.Decresased DPP. No bruits present. Neurologic: Alert and oriented x3. Deep tendon reflexes symmetrical and normal.  Gait normal .       Skin:  Intact without suspicious lesions or rashes. Lymph: No  cervical, axillary lymphadenopathy present. Psych: Mood and affect are normal. Normally interactive                                                                                        Assessment & Plan:  See Current Assessment & Plan in Problem List under specific DiagnosisThe labs will be reviewed and risks and options assessed. Written recommendations will be provided by mail or directly through My Chart.Further evaluation or change in medical therapy will be directed by those results.

## 2014-01-10 NOTE — Assessment & Plan Note (Signed)
TSH 

## 2014-01-10 NOTE — Progress Notes (Signed)
Pre visit review using our clinic review tool, if applicable. No additional management support is needed unless otherwise documented below in the visit note. 

## 2014-01-10 NOTE — Assessment & Plan Note (Signed)
CBC

## 2014-01-13 ENCOUNTER — Other Ambulatory Visit (INDEPENDENT_AMBULATORY_CARE_PROVIDER_SITE_OTHER): Payer: Medicare Other

## 2014-01-13 ENCOUNTER — Other Ambulatory Visit: Payer: Self-pay | Admitting: Internal Medicine

## 2014-01-13 DIAGNOSIS — D126 Benign neoplasm of colon, unspecified: Secondary | ICD-10-CM

## 2014-01-13 DIAGNOSIS — E890 Postprocedural hypothyroidism: Secondary | ICD-10-CM

## 2014-01-13 DIAGNOSIS — E785 Hyperlipidemia, unspecified: Secondary | ICD-10-CM

## 2014-01-13 LAB — CBC WITH DIFFERENTIAL/PLATELET
BASOS ABS: 0 10*3/uL (ref 0.0–0.1)
Basophils Relative: 0.3 % (ref 0.0–3.0)
Eosinophils Absolute: 0.1 10*3/uL (ref 0.0–0.7)
Eosinophils Relative: 1.8 % (ref 0.0–5.0)
HEMATOCRIT: 42.4 % (ref 36.0–46.0)
Hemoglobin: 14.1 g/dL (ref 12.0–15.0)
LYMPHS PCT: 29.1 % (ref 12.0–46.0)
Lymphs Abs: 2.1 10*3/uL (ref 0.7–4.0)
MCHC: 33.2 g/dL (ref 30.0–36.0)
MCV: 91.5 fl (ref 78.0–100.0)
MONOS PCT: 5.4 % (ref 3.0–12.0)
Monocytes Absolute: 0.4 10*3/uL (ref 0.1–1.0)
Neutro Abs: 4.5 10*3/uL (ref 1.4–7.7)
Neutrophils Relative %: 63.4 % (ref 43.0–77.0)
Platelets: 203 10*3/uL (ref 150.0–400.0)
RBC: 4.63 Mil/uL (ref 3.87–5.11)
RDW: 14.9 % (ref 11.5–15.5)
WBC: 7.1 10*3/uL (ref 4.0–10.5)

## 2014-01-13 LAB — BASIC METABOLIC PANEL
BUN: 20 mg/dL (ref 6–23)
CALCIUM: 9.1 mg/dL (ref 8.4–10.5)
CO2: 28 mEq/L (ref 19–32)
Chloride: 106 mEq/L (ref 96–112)
Creatinine, Ser: 1.1 mg/dL (ref 0.4–1.2)
GFR: 53.28 mL/min — AB (ref >60.00)
GLUCOSE: 85 mg/dL (ref 70–99)
Potassium: 4.3 mEq/L (ref 3.5–5.1)
Sodium: 140 mEq/L (ref 135–145)

## 2014-01-13 LAB — HEPATIC FUNCTION PANEL
ALT: 18 U/L (ref 0–35)
AST: 23 U/L (ref 0–37)
Albumin: 4.1 g/dL (ref 3.5–5.2)
Alkaline Phosphatase: 61 U/L (ref 39–117)
BILIRUBIN TOTAL: 1.6 mg/dL — AB (ref 0.2–1.2)
Bilirubin, Direct: 0.2 mg/dL (ref 0.0–0.3)
Total Protein: 7.2 g/dL (ref 6.0–8.3)

## 2014-01-13 LAB — TSH: TSH: 16.81 u[IU]/mL — AB (ref 0.35–4.50)

## 2014-01-16 ENCOUNTER — Telehealth: Payer: Self-pay

## 2014-01-16 ENCOUNTER — Other Ambulatory Visit: Payer: Self-pay | Admitting: Internal Medicine

## 2014-01-16 DIAGNOSIS — E890 Postprocedural hypothyroidism: Secondary | ICD-10-CM

## 2014-01-16 NOTE — Telephone Encounter (Signed)
Patient has been advised

## 2014-01-16 NOTE — Telephone Encounter (Signed)
Now on 1/2 qd Change to 1/2 M,W,F, & Sun 1 T, Th, & Sat TSH in 10 weeks

## 2014-01-16 NOTE — Telephone Encounter (Signed)
Phone call to patient. I spoke with her husband since she was busy at the time. He states her present thyroid dose is 62.5 mcg before breakfast. (she cuts a 125 mcg in half)

## 2014-01-16 NOTE — Addendum Note (Signed)
Addended by: Roma Schanz R on: 01/16/2014 09:50 AM   Modules accepted: Orders

## 2014-01-16 NOTE — Telephone Encounter (Signed)
-----   Message from Hendricks Limes, MD sent at 01/14/2014 12:23 PM EST ----- see TSH; please confirm present thyroid dose

## 2014-01-18 ENCOUNTER — Ambulatory Visit (INDEPENDENT_AMBULATORY_CARE_PROVIDER_SITE_OTHER): Payer: Medicare Other | Admitting: *Deleted

## 2014-01-18 DIAGNOSIS — I4891 Unspecified atrial fibrillation: Secondary | ICD-10-CM

## 2014-01-18 DIAGNOSIS — Z5181 Encounter for therapeutic drug level monitoring: Secondary | ICD-10-CM

## 2014-01-18 LAB — NMR LIPOPROFILE WITH LIPIDS
Cholesterol, Total: 156 mg/dL (ref 100–199)
HDL Particle Number: 16 umol/L — ABNORMAL LOW (ref >=30.5)
HDL SIZE: 10.2 nm (ref >=9.2)
HDL-C: 44 mg/dL (ref >39)
LDL CALC: 80 mg/dL (ref 0–99)
LDL PARTICLE NUMBER: 1562 nmol/L — AB (ref <1000)
LDL Size: 22.1 nm (ref >=20.8)
LP-IR SCORE: 45 (ref <=45)
Large HDL-P: 7.8 umol/L (ref >=4.8)
Large VLDL-P: 4.4 nmol/L — ABNORMAL HIGH (ref <=2.7)
Small LDL Particle Number: 243 nmol/L (ref <=527)
TRIGLYCERIDES: 162 mg/dL — AB (ref 0–149)
VLDL SIZE: 59.3 nm — AB (ref <=46.6)

## 2014-01-18 LAB — POCT INR: INR: 1.5

## 2014-01-19 ENCOUNTER — Encounter: Payer: Self-pay | Admitting: Internal Medicine

## 2014-01-26 ENCOUNTER — Encounter (HOSPITAL_COMMUNITY): Payer: Self-pay | Admitting: Internal Medicine

## 2014-02-01 ENCOUNTER — Ambulatory Visit (INDEPENDENT_AMBULATORY_CARE_PROVIDER_SITE_OTHER): Payer: Medicare Other | Admitting: *Deleted

## 2014-02-01 DIAGNOSIS — I4891 Unspecified atrial fibrillation: Secondary | ICD-10-CM

## 2014-02-01 DIAGNOSIS — Z5181 Encounter for therapeutic drug level monitoring: Secondary | ICD-10-CM

## 2014-02-01 LAB — POCT INR: INR: 2.2

## 2014-02-03 ENCOUNTER — Other Ambulatory Visit: Payer: Self-pay | Admitting: Internal Medicine

## 2014-02-22 ENCOUNTER — Ambulatory Visit (INDEPENDENT_AMBULATORY_CARE_PROVIDER_SITE_OTHER): Payer: Medicare Other | Admitting: Pharmacist

## 2014-02-22 DIAGNOSIS — I4891 Unspecified atrial fibrillation: Secondary | ICD-10-CM | POA: Diagnosis not present

## 2014-02-22 DIAGNOSIS — Z5181 Encounter for therapeutic drug level monitoring: Secondary | ICD-10-CM

## 2014-02-22 LAB — POCT INR: INR: 2.8

## 2014-03-20 ENCOUNTER — Ambulatory Visit (INDEPENDENT_AMBULATORY_CARE_PROVIDER_SITE_OTHER): Payer: Medicare Other

## 2014-03-20 DIAGNOSIS — I4891 Unspecified atrial fibrillation: Secondary | ICD-10-CM

## 2014-03-20 DIAGNOSIS — Z5181 Encounter for therapeutic drug level monitoring: Secondary | ICD-10-CM

## 2014-03-20 LAB — POCT INR: INR: 3.2

## 2014-03-21 DIAGNOSIS — H2513 Age-related nuclear cataract, bilateral: Secondary | ICD-10-CM | POA: Diagnosis not present

## 2014-03-21 DIAGNOSIS — R51 Headache: Secondary | ICD-10-CM | POA: Diagnosis not present

## 2014-03-21 DIAGNOSIS — H40013 Open angle with borderline findings, low risk, bilateral: Secondary | ICD-10-CM | POA: Diagnosis not present

## 2014-03-22 ENCOUNTER — Ambulatory Visit (INDEPENDENT_AMBULATORY_CARE_PROVIDER_SITE_OTHER): Payer: Medicare Other | Admitting: *Deleted

## 2014-03-22 DIAGNOSIS — I429 Cardiomyopathy, unspecified: Secondary | ICD-10-CM | POA: Diagnosis not present

## 2014-03-22 NOTE — Progress Notes (Signed)
Remote ICD transmission.   

## 2014-03-24 LAB — MDC_IDC_ENUM_SESS_TYPE_REMOTE
Battery Voltage: 2.99 V
Brady Statistic AP VS Percent: 0 %
Brady Statistic AS VP Percent: 99.97 %
Date Time Interrogation Session: 20160203164445
HIGH POWER IMPEDANCE MEASURED VALUE: 304 Ohm
HIGH POWER IMPEDANCE MEASURED VALUE: 44 Ohm
HighPow Impedance: 59 Ohm
Lead Channel Impedance Value: 437 Ohm
Lead Channel Impedance Value: 589 Ohm
Lead Channel Impedance Value: 912 Ohm
Lead Channel Pacing Threshold Amplitude: 0.625 V
Lead Channel Pacing Threshold Amplitude: 1.125 V
Lead Channel Pacing Threshold Pulse Width: 0.8 ms
Lead Channel Sensing Intrinsic Amplitude: 0.75 mV
Lead Channel Setting Pacing Amplitude: 2.25 V
Lead Channel Setting Pacing Amplitude: 2.5 V
Lead Channel Setting Pacing Pulse Width: 0.4 ms
Lead Channel Setting Pacing Pulse Width: 0.8 ms
Lead Channel Setting Sensing Sensitivity: 0.3 mV
MDC IDC MSMT LEADCHNL LV IMPEDANCE VALUE: 456 Ohm
MDC IDC MSMT LEADCHNL RA IMPEDANCE VALUE: 399 Ohm
MDC IDC MSMT LEADCHNL RA SENSING INTR AMPL: 0.75 mV
MDC IDC MSMT LEADCHNL RV PACING THRESHOLD PULSEWIDTH: 0.4 ms
MDC IDC SET ZONE DETECTION INTERVAL: 210 ms
MDC IDC SET ZONE DETECTION INTERVAL: 350 ms
MDC IDC SET ZONE DETECTION INTERVAL: 360 ms
MDC IDC SET ZONE DETECTION INTERVAL: 450 ms
MDC IDC STAT BRADY AP VP PERCENT: 0 %
MDC IDC STAT BRADY AS VS PERCENT: 0.03 %
MDC IDC STAT BRADY RA PERCENT PACED: 0 %
MDC IDC STAT BRADY RV PERCENT PACED: 99.97 %
Zone Setting Detection Interval: 250 ms

## 2014-04-17 ENCOUNTER — Ambulatory Visit (INDEPENDENT_AMBULATORY_CARE_PROVIDER_SITE_OTHER): Payer: Medicare Other | Admitting: *Deleted

## 2014-04-17 ENCOUNTER — Encounter: Payer: Self-pay | Admitting: Cardiology

## 2014-04-17 DIAGNOSIS — Z5181 Encounter for therapeutic drug level monitoring: Secondary | ICD-10-CM

## 2014-04-17 DIAGNOSIS — I4891 Unspecified atrial fibrillation: Secondary | ICD-10-CM | POA: Diagnosis not present

## 2014-04-17 LAB — POCT INR: INR: 3.2

## 2014-04-24 ENCOUNTER — Encounter: Payer: Self-pay | Admitting: Internal Medicine

## 2014-05-01 ENCOUNTER — Ambulatory Visit (INDEPENDENT_AMBULATORY_CARE_PROVIDER_SITE_OTHER): Payer: Medicare Other

## 2014-05-01 DIAGNOSIS — Z5181 Encounter for therapeutic drug level monitoring: Secondary | ICD-10-CM | POA: Diagnosis not present

## 2014-05-01 DIAGNOSIS — I4891 Unspecified atrial fibrillation: Secondary | ICD-10-CM

## 2014-05-01 LAB — POCT INR: INR: 3.4

## 2014-05-08 ENCOUNTER — Other Ambulatory Visit: Payer: Self-pay | Admitting: Internal Medicine

## 2014-05-08 ENCOUNTER — Other Ambulatory Visit: Payer: Self-pay | Admitting: General Practice

## 2014-05-08 ENCOUNTER — Telehealth: Payer: Self-pay | Admitting: Internal Medicine

## 2014-05-08 MED ORDER — WARFARIN SODIUM 5 MG PO TABS: ORAL_TABLET | ORAL | Status: DC

## 2014-05-11 ENCOUNTER — Other Ambulatory Visit: Payer: Self-pay | Admitting: Internal Medicine

## 2014-05-11 DIAGNOSIS — E890 Postprocedural hypothyroidism: Secondary | ICD-10-CM

## 2014-05-11 NOTE — Telephone Encounter (Signed)
Phone call to patient. (see 01/16/14 phone note) patient states since this date she has not been taking the proper dose due not having enough tablets. She has been in contact with the pharmacy. Not our office. Do you want her to have her thyroid checked to see if there may be another change? Please advise.

## 2014-05-11 NOTE — Progress Notes (Signed)
Patient coming in Monday at 42 and bringing pill bottle with her. Advised to stop the lab first to have her thyroid level drawn.

## 2014-05-11 NOTE — Progress Notes (Signed)
Patient advised to just come have labs on Monday. She does not have her last pill bottle to bring

## 2014-05-11 NOTE — Progress Notes (Signed)
Do you actually want to see her Monday for an appt ?

## 2014-05-11 NOTE — Telephone Encounter (Signed)
Pt called in to get refill on her Synthroid meds?    [place] number [phone]  Pt is having a really hard time getting her meds.  She said that.  It should have been increased ?  She is requesting a call back from the nurse

## 2014-05-15 ENCOUNTER — Ambulatory Visit (INDEPENDENT_AMBULATORY_CARE_PROVIDER_SITE_OTHER): Payer: Medicare Other | Admitting: *Deleted

## 2014-05-15 ENCOUNTER — Other Ambulatory Visit (INDEPENDENT_AMBULATORY_CARE_PROVIDER_SITE_OTHER): Payer: Medicare Other

## 2014-05-15 DIAGNOSIS — I4891 Unspecified atrial fibrillation: Secondary | ICD-10-CM | POA: Diagnosis not present

## 2014-05-15 DIAGNOSIS — E890 Postprocedural hypothyroidism: Secondary | ICD-10-CM

## 2014-05-15 DIAGNOSIS — Z5181 Encounter for therapeutic drug level monitoring: Secondary | ICD-10-CM | POA: Diagnosis not present

## 2014-05-15 LAB — TSH: TSH: 0.07 u[IU]/mL — ABNORMAL LOW (ref 0.35–4.50)

## 2014-05-15 LAB — POCT INR: INR: 2.2

## 2014-05-16 ENCOUNTER — Ambulatory Visit (INDEPENDENT_AMBULATORY_CARE_PROVIDER_SITE_OTHER): Payer: Medicare Other | Admitting: Internal Medicine

## 2014-05-16 ENCOUNTER — Encounter: Payer: Self-pay | Admitting: Internal Medicine

## 2014-05-16 VITALS — BP 118/84 | HR 74 | Temp 97.6°F | Ht 64.0 in | Wt 152.0 lb

## 2014-05-16 DIAGNOSIS — R599 Enlarged lymph nodes, unspecified: Secondary | ICD-10-CM | POA: Diagnosis not present

## 2014-05-16 DIAGNOSIS — E890 Postprocedural hypothyroidism: Secondary | ICD-10-CM | POA: Diagnosis not present

## 2014-05-16 DIAGNOSIS — R634 Abnormal weight loss: Secondary | ICD-10-CM

## 2014-05-16 DIAGNOSIS — R590 Localized enlarged lymph nodes: Secondary | ICD-10-CM

## 2014-05-16 MED ORDER — LEVOTHYROXINE SODIUM 112 MCG PO TABS: 112.0000 ug | ORAL_TABLET | Freq: Every day | ORAL | Status: DC

## 2014-05-16 MED ORDER — LEVOTHYROXINE SODIUM 112 MCG PO TABS: ORAL_TABLET | ORAL | Status: DC

## 2014-05-16 NOTE — Patient Instructions (Signed)
TSH in 10-11 weeks please

## 2014-05-16 NOTE — Progress Notes (Signed)
   Subjective:    Patient ID: Michele [name], female    DOB: December 20, 1929, 81 y.o.   MRN: [id number]  HPI   Her TSH on thyroid 125 g one half pill Tuesday, Thursday, and Saturday with 1 pill all other days was 0.07. Her previous values on a half a pill a day had been 16.81.  She describes sleepiness and fatigue as well as decreased appetite.  She has alternating loose-watery stools despite taking a probiotic.She's had a 20 pound weight loss without other associated GI symptoms or bleeding dyscrasias. She describes her nails as being soft and hair loss. She also describes lightheadedness. She is intolerant to heat.  She has noted what she believes is a lymph node below the left mastoid. It is not changing in size or color. It is nontender.  Review of Systems Abdominal pain, significant dyspepsia, dysphagia, melena, rectal bleeding, or persistently small caliber stools are denied.  She denies fever, chills, sweats.  Epistaxis, hemoptysis, or hematuria denied. There is no abnormal bruising , bleeding, or difficulty stopping bleeding with injury.  She has no blurred vision, double vision, or loss of vision. She also denies tachycardia. She has no tremor or anxiety.    Objective:   Physical Exam  Pertinent or positive findings include:  She has a 1 x 1 cm lymph node versus lipoma below the left mastoid.  She has no other lymphadenopathy or organomegaly.  An S4 is present. There is asymmetry of the deep tendon reflexes of the lower extremities. The right is 2+ on the left is 1+.   General appearance :adequately nourished; in no distress. Eyes: No conjunctival inflammation or scleral icterus is present. Oral exam:  Lips and gums are healthy appearing.There is no oropharyngeal erythema or exudate noted. Dental hygiene is good. Heart:  Normal rate and regular rhythm. S1 and S2 normal without gallop, murmur, click, or rub. Lungs:Chest clear to auscultation; no wheezes, rhonchi,rales ,or  rubs present.No increased work of breathing.  Abdomen: bowel sounds normal, soft and non-tender without masses, organomegaly or hernias noted.  No guarding or rebound. Vascular : all pulses equal ; no bruits present. Skin:Warm & dry.  Intact without suspicious lesions or rashes ; no tenting or jaundice  Neuro: Strength, tone  normal.        Assessment & Plan:  #1 hypothyroidism, postsurgical. Overcorrection with thyroid replacement.  #2 weight loss in the context of #1  #3 stable lymph node versus lipoma left posterior neck below the mastoid  Plan: Based on her present thyroid dose she will be changed to 112 g daily except half on Wednesday. TSH will be checked in 10 weeks. New progress she's been asked to monitor the lymph node versus lipoma. If it enlarges or becomes tender; it should be removed.

## 2014-05-16 NOTE — Progress Notes (Signed)
Pre visit review using our clinic review tool, if applicable. No additional management support is needed unless otherwise documented below in the visit note. 

## 2014-05-23 ENCOUNTER — Encounter: Payer: Self-pay | Admitting: Gastroenterology

## 2014-06-13 ENCOUNTER — Other Ambulatory Visit: Payer: Self-pay | Admitting: Internal Medicine

## 2014-06-19 DIAGNOSIS — R339 Retention of urine, unspecified: Secondary | ICD-10-CM | POA: Diagnosis not present

## 2014-06-19 DIAGNOSIS — R3911 Hesitancy of micturition: Secondary | ICD-10-CM | POA: Diagnosis not present

## 2014-06-26 ENCOUNTER — Ambulatory Visit (INDEPENDENT_AMBULATORY_CARE_PROVIDER_SITE_OTHER): Payer: Medicare Other | Admitting: *Deleted

## 2014-06-26 ENCOUNTER — Ambulatory Visit (INDEPENDENT_AMBULATORY_CARE_PROVIDER_SITE_OTHER): Payer: Medicare Other

## 2014-06-26 DIAGNOSIS — I429 Cardiomyopathy, unspecified: Secondary | ICD-10-CM

## 2014-06-26 DIAGNOSIS — I4891 Unspecified atrial fibrillation: Secondary | ICD-10-CM | POA: Diagnosis not present

## 2014-06-26 DIAGNOSIS — Z5181 Encounter for therapeutic drug level monitoring: Secondary | ICD-10-CM

## 2014-06-26 LAB — POCT INR: INR: 4.4

## 2014-06-26 NOTE — Progress Notes (Signed)
Remote ICD transmission.   

## 2014-06-30 LAB — CUP PACEART REMOTE DEVICE CHECK
Battery Voltage: 2.96 V
Brady Statistic RV Percent Paced: 99.9 %
HIGH POWER IMPEDANCE MEASURED VALUE: 43 Ohm
HighPow Impedance: 304 Ohm
HighPow Impedance: 59 Ohm
Lead Channel Impedance Value: 399 Ohm
Lead Channel Impedance Value: 456 Ohm
Lead Channel Impedance Value: 855 Ohm
Lead Channel Pacing Threshold Amplitude: 0.625 V
Lead Channel Pacing Threshold Amplitude: 1.125 V
Lead Channel Pacing Threshold Pulse Width: 0.8 ms
Lead Channel Sensing Intrinsic Amplitude: 0.625 mV
Lead Channel Setting Pacing Amplitude: 2.25 V
Lead Channel Setting Pacing Amplitude: 2.5 V
Lead Channel Setting Pacing Pulse Width: 0.4 ms
Lead Channel Setting Pacing Pulse Width: 0.8 ms
Lead Channel Setting Sensing Sensitivity: 0.3 mV
MDC IDC MSMT LEADCHNL LV IMPEDANCE VALUE: 532 Ohm
MDC IDC MSMT LEADCHNL RA SENSING INTR AMPL: 0.625 mV
MDC IDC MSMT LEADCHNL RV IMPEDANCE VALUE: 437 Ohm
MDC IDC MSMT LEADCHNL RV PACING THRESHOLD PULSEWIDTH: 0.4 ms
MDC IDC SESS DTM: 20160509172515
MDC IDC SET ZONE DETECTION INTERVAL: 210 ms
MDC IDC SET ZONE DETECTION INTERVAL: 250 ms
MDC IDC SET ZONE DETECTION INTERVAL: 450 ms
MDC IDC STAT BRADY AP VP PERCENT: 0 %
MDC IDC STAT BRADY AP VS PERCENT: 0 %
MDC IDC STAT BRADY AS VP PERCENT: 99.9 %
MDC IDC STAT BRADY AS VS PERCENT: 0.1 %
MDC IDC STAT BRADY RA PERCENT PACED: 0 %
Zone Setting Detection Interval: 350 ms
Zone Setting Detection Interval: 360 ms

## 2014-07-06 DIAGNOSIS — R102 Pelvic and perineal pain: Secondary | ICD-10-CM | POA: Diagnosis not present

## 2014-07-06 DIAGNOSIS — R399 Unspecified symptoms and signs involving the genitourinary system: Secondary | ICD-10-CM | POA: Diagnosis not present

## 2014-07-07 ENCOUNTER — Ambulatory Visit (INDEPENDENT_AMBULATORY_CARE_PROVIDER_SITE_OTHER): Payer: Medicare Other | Admitting: *Deleted

## 2014-07-07 DIAGNOSIS — Z5181 Encounter for therapeutic drug level monitoring: Secondary | ICD-10-CM

## 2014-07-07 DIAGNOSIS — I4891 Unspecified atrial fibrillation: Secondary | ICD-10-CM

## 2014-07-07 LAB — POCT INR: INR: 2.6

## 2014-07-10 ENCOUNTER — Encounter: Payer: Self-pay | Admitting: Cardiology

## 2014-07-13 ENCOUNTER — Encounter: Payer: Self-pay | Admitting: Internal Medicine

## 2014-07-13 ENCOUNTER — Ambulatory Visit (INDEPENDENT_AMBULATORY_CARE_PROVIDER_SITE_OTHER): Payer: Medicare Other | Admitting: Internal Medicine

## 2014-07-13 ENCOUNTER — Other Ambulatory Visit (INDEPENDENT_AMBULATORY_CARE_PROVIDER_SITE_OTHER): Payer: Medicare Other

## 2014-07-13 VITALS — BP 110/70 | HR 82 | Temp 97.7°F | Ht 64.0 in | Wt 145.0 lb

## 2014-07-13 DIAGNOSIS — E890 Postprocedural hypothyroidism: Secondary | ICD-10-CM

## 2014-07-13 LAB — TSH: TSH: 0.07 u[IU]/mL — ABNORMAL LOW (ref 0.35–4.50)

## 2014-07-13 NOTE — Assessment & Plan Note (Signed)
L thyroxine 112 mcg M,W,F, Sun 1/2 pill Tues ,Th, & Sat TSH in 8-10 weeks

## 2014-07-13 NOTE — Patient Instructions (Signed)
Decrease thyroid pill to 1 pill Monday, Wednesday, Friday, and Sunday. Please take one half pill Tuesday, Thursday, and Saturday. Recheck the TSH in 8-10 weeks

## 2014-07-13 NOTE — Progress Notes (Signed)
   Subjective:    Patient ID: Michele [name], female    DOB: October 29, 1929, 81 y.o.   MRN: [id number]  HPI  She is here in follow-up of her hypothyroidism following total thyroidectomy. No malignant tissue was found. She had a TSH drawn today. In November 2015 her TSH was 16.81; on 05/15/14 it was 0.07 .She is presently on 112 g daily except for a half a pill on Wednesday. Today's TSH remains at 0.07  Review of Systems  No significant sleep disorder; change in appetite;weight change. No blurred, double ,loss of vision No palpitations; racing; irregularity No constipation; diarrhea;hoarseness. No change in nails,skin or skin No numbness or tingling; tremor No anxiety; depression; panic attacks She does describe feeling warm; this is a chronic issue. She sees Dr Karsten Ro for urinary hesitancy.     Objective:   Physical Exam Pertinent or positive findings include: Thyroid absent. There is a large osteophyte in the posterior cervical superior spine on the left. There is a smaller osteophyte on the right. She has mild DIP changes.  DTRs are 1.5+ and equal.  General appearance :adequately nourished; in no distress. Eyes: No conjunctival inflammation or scleral icterus is present. Oral exam:  Lips and gums are healthy appearing.There is no oropharyngeal erythema or exudate noted. Dental hygiene is good. Heart:  Normal rate and regular rhythm. S1 and S2 normal without gallop, murmur, click, rub or other extra sounds   Lungs:Chest clear to auscultation; no wheezes, rhonchi,rales ,or rubs present.No increased work of breathing. Abdomen: bowel sounds normal, soft and non-tender without masses, organomegaly or hernias noted.  No guarding or rebound.  Vascular : all pulses equal ; no bruits present. Skin:Warm & dry.  Intact without suspicious lesions or rashes ; no tenting or jaundice  Lymphatic: No lymphadenopathy is noted about the head, neck, axilla Neuro: Strength, tones normal.         Assessment & Plan:  See Current Assessment & Plan in Problem List under specific Diagnosis

## 2014-07-13 NOTE — Progress Notes (Signed)
Pre visit review using our clinic review tool, if applicable. No additional management support is needed unless otherwise documented below in the visit note. 

## 2014-07-20 ENCOUNTER — Telehealth: Payer: Self-pay | Admitting: Internal Medicine

## 2014-07-20 NOTE — Telephone Encounter (Signed)
New message ° ° ° ° ° ° °Talk to the nurse.  Pt would not tell me what she wanted °

## 2014-07-20 NOTE — Telephone Encounter (Signed)
Patient states she receive something from her insurance company about her heart failure and what medications she should be taking.  Explained that I couldn't  find anything in her chart stating she has heart failure. Advised to contact insurance company to find out which physician stated this.  She mentioned that last time she was in our office her paper said heart failure on the top, she said something about this and it was corrected.  (this may the reason insurance sent this) Patient will call back if she needs anything further on this.

## 2014-07-21 DIAGNOSIS — H40013 Open angle with borderline findings, low risk, bilateral: Secondary | ICD-10-CM | POA: Diagnosis not present

## 2014-07-25 ENCOUNTER — Ambulatory Visit (INDEPENDENT_AMBULATORY_CARE_PROVIDER_SITE_OTHER): Payer: Medicare Other | Admitting: *Deleted

## 2014-07-25 DIAGNOSIS — Z5181 Encounter for therapeutic drug level monitoring: Secondary | ICD-10-CM

## 2014-07-25 DIAGNOSIS — I4891 Unspecified atrial fibrillation: Secondary | ICD-10-CM

## 2014-07-25 LAB — POCT INR: INR: 1.9

## 2014-07-30 ENCOUNTER — Other Ambulatory Visit: Payer: Self-pay | Admitting: Internal Medicine

## 2014-08-11 ENCOUNTER — Ambulatory Visit (INDEPENDENT_AMBULATORY_CARE_PROVIDER_SITE_OTHER): Payer: Medicare Other | Admitting: Internal Medicine

## 2014-08-11 ENCOUNTER — Other Ambulatory Visit (INDEPENDENT_AMBULATORY_CARE_PROVIDER_SITE_OTHER): Payer: Medicare Other

## 2014-08-11 ENCOUNTER — Encounter: Payer: Self-pay | Admitting: Internal Medicine

## 2014-08-11 VITALS — BP 104/68 | HR 81 | Temp 97.5°F | Ht 64.0 in | Wt 144.0 lb

## 2014-08-11 DIAGNOSIS — H9392 Unspecified disorder of left ear: Secondary | ICD-10-CM

## 2014-08-11 DIAGNOSIS — R42 Dizziness and giddiness: Secondary | ICD-10-CM | POA: Diagnosis not present

## 2014-08-11 DIAGNOSIS — I951 Orthostatic hypotension: Secondary | ICD-10-CM

## 2014-08-11 DIAGNOSIS — H6123 Impacted cerumen, bilateral: Secondary | ICD-10-CM | POA: Diagnosis not present

## 2014-08-11 DIAGNOSIS — E890 Postprocedural hypothyroidism: Secondary | ICD-10-CM

## 2014-08-11 LAB — BASIC METABOLIC PANEL
BUN: 23 mg/dL (ref 6–23)
CHLORIDE: 103 meq/L (ref 96–112)
CO2: 29 meq/L (ref 19–32)
Calcium: 9.5 mg/dL (ref 8.4–10.5)
Creatinine, Ser: 1.13 mg/dL (ref 0.40–1.20)
GFR: 48.89 mL/min — ABNORMAL LOW (ref >60.00)
Glucose, Bld: 89 mg/dL (ref 70–99)
Potassium: 4.3 mEq/L (ref 3.5–5.1)
Sodium: 137 mEq/L (ref 135–145)

## 2014-08-11 LAB — CBC WITH DIFFERENTIAL/PLATELET
Basophils Absolute: 0 10*3/uL (ref 0.0–0.1)
Basophils Relative: 0.4 % (ref 0.0–3.0)
EOS ABS: 0.1 10*3/uL (ref 0.0–0.7)
Eosinophils Relative: 1.5 % (ref 0.0–5.0)
HEMATOCRIT: 41.6 % (ref 36.0–46.0)
Hemoglobin: 14 g/dL (ref 12.0–15.0)
LYMPHS PCT: 30.4 % (ref 12.0–46.0)
Lymphs Abs: 2.6 10*3/uL (ref 0.7–4.0)
MCHC: 33.7 g/dL (ref 30.0–36.0)
MCV: 87.7 fl (ref 78.0–100.0)
Monocytes Absolute: 0.6 10*3/uL (ref 0.1–1.0)
Monocytes Relative: 6.5 % (ref 3.0–12.0)
NEUTROS ABS: 5.3 10*3/uL (ref 1.4–7.7)
NEUTROS PCT: 61.2 % (ref 43.0–77.0)
PLATELETS: 203 10*3/uL (ref 150.0–400.0)
RBC: 4.75 Mil/uL (ref 3.87–5.11)
RDW: 15.5 % (ref 11.5–15.5)
WBC: 8.7 10*3/uL (ref 4.0–10.5)

## 2014-08-11 LAB — TSH: TSH: 0.12 u[IU]/mL — ABNORMAL LOW (ref 0.35–4.50)

## 2014-08-11 NOTE — Progress Notes (Signed)
Pre visit review using our clinic review tool, if applicable. No additional management support is needed unless otherwise documented below in the visit note. 

## 2014-08-11 NOTE — Patient Instructions (Addendum)
The ENT referral will be scheduled and you'll be notified of the time.Please call the Referral Co-Ordinator @ [phone] if you have not been notified of appointment time within 7-10 days.  Perform isometric exercise of calves  ( while seated go up on toes to count of 5 & then onto heels for 5 count). Repeat  4- 5 times prior to standing if you've been seated or supine for any significant period of time as BP drops with such positions.    Your next office appointment will be determined based upon review of your pending labs . Those written interpretation of the lab results and instructions will be transmitted to you by mail for your records.  Critical results will be called.   Followup as needed for any active or acute issue. Please report any significant change in your symptoms.

## 2014-08-11 NOTE — Progress Notes (Signed)
   Subjective:    Patient ID: Michele [name], female    DOB: December 25, 1929, 81 y.o.   MRN: [id number]  HPI Her symptoms began 6 weeks ago as if she were "spinning". The initial symptoms were present as she stood up from a chair. She's had additional episodes when she turns in bed. She is having less issues with change in position at this time. Additionally previously she had some symptoms with rotation of the neck to either side. She does have the symptoms with neck flexion or extension.  The initial symptoms were severe enough that she had difficulty ambulating due to instability. Symptoms now last less than 1 minute.  She had similar symptoms 6 years ago which lasted 2 months. She was tested "with ice water in the ear". The symptoms resolved after 2 months without specific treatment  Review of Systems  Denied were any change in heart rhythm or rate prior to the event. There was no associated chest pain or shortness of breath .  Also specifically denied prior to the episode were headache, limb weakness, tingling, or numbness. No seizure activity noted.  She has had a 24 pound weight loss since last October. 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  She states that she's had some pressure at times in the right ear. When she talks she feels as if she can hear her voice echoing.  Occasionally she has had a nonproductive cough. She has sneezing but no other significant extrinsic symptoms.     Objective:   Physical Exam Bilateral cerumen impactions are present. Initially she had unsustained nystagmus when she was placed supine and head was rotated to the right. The tuning fork lateralized to the right. There was asymmetric air conduction. She heard it better on the left than the right.  Pertinent or positive findings include: General appearance :adequately nourished; in no distress.  Eyes: No conjunctival inflammation or scleral icterus is present.EOM & FOV  WNL.  Oral exam:  Lips and gums are healthy appearing.There is no oropharyngeal erythema or exudate noted. Dental hygiene is good.  Heart:  Normal rate and regular rhythm. S1 and S2 normal without gallop, murmur, click, rub or other extra sounds. Supine BP:114/70 Sitting BP: 108/70 Standing BP:92/60 No change in pulse               Lungs:Chest clear to auscultation; no wheezes, rhonchi,rales ,or rubs present.No increased work of breathing.   Abdomen: bowel sounds normal, soft and non-tender without masses, organomegaly or hernias noted.  No guarding or rebound.   Vascular : all pulses equal ; no bruits present.  Skin:Warm & dry.  Intact without suspicious lesions or rashes ; no tenting or jaundice   Lymphatic: No lymphadenopathy is noted about the head, neck, axilla  Neuro: Strength, tone & DTRs normal.Cranial nerve exam WNL except as noted above.        Assessment & Plan:  #1 positional dizziness #2 postural hypotension #3 abnormal tuning fork exam #4 cerumen impactions See orders: ENT consult [employer] pre standing

## 2014-08-15 ENCOUNTER — Other Ambulatory Visit: Payer: Self-pay | Admitting: Internal Medicine

## 2014-08-15 DIAGNOSIS — E890 Postprocedural hypothyroidism: Secondary | ICD-10-CM

## 2014-08-16 ENCOUNTER — Ambulatory Visit (INDEPENDENT_AMBULATORY_CARE_PROVIDER_SITE_OTHER): Payer: Medicare Other | Admitting: *Deleted

## 2014-08-16 DIAGNOSIS — Z5181 Encounter for therapeutic drug level monitoring: Secondary | ICD-10-CM

## 2014-08-16 DIAGNOSIS — I4891 Unspecified atrial fibrillation: Secondary | ICD-10-CM

## 2014-08-16 LAB — POCT INR: INR: 2.4

## 2014-08-25 ENCOUNTER — Other Ambulatory Visit: Payer: Self-pay | Admitting: Internal Medicine

## 2014-08-28 ENCOUNTER — Telehealth: Payer: Self-pay | Admitting: Internal Medicine

## 2014-08-28 NOTE — Telephone Encounter (Signed)
Michele [name] called stating they never received the prescription for patients synthroid.  Can you please fax back over

## 2014-08-28 NOTE — Telephone Encounter (Signed)
Pharm stated they filled the RX 08/28/14

## 2014-08-29 ENCOUNTER — Encounter: Payer: Self-pay | Admitting: Gastroenterology

## 2014-08-30 DIAGNOSIS — Z87898 Personal history of other specified conditions: Secondary | ICD-10-CM | POA: Diagnosis not present

## 2014-08-30 DIAGNOSIS — R42 Dizziness and giddiness: Secondary | ICD-10-CM | POA: Diagnosis not present

## 2014-08-30 DIAGNOSIS — H6123 Impacted cerumen, bilateral: Secondary | ICD-10-CM | POA: Diagnosis not present

## 2014-09-06 ENCOUNTER — Emergency Department (HOSPITAL_COMMUNITY)
Admission: EM | Admit: 2014-09-06 | Discharge: 2014-09-06 | Disposition: A | Discharge status: Home / Self Care | Payer: Medicare Other | Attending: Emergency Medicine | Admitting: Emergency Medicine

## 2014-09-06 ENCOUNTER — Emergency Department (HOSPITAL_COMMUNITY): Payer: Medicare Other

## 2014-09-06 ENCOUNTER — Encounter (HOSPITAL_COMMUNITY): Payer: Self-pay | Admitting: Emergency Medicine

## 2014-09-06 DIAGNOSIS — I509 Heart failure, unspecified: Secondary | ICD-10-CM | POA: Insufficient documentation

## 2014-09-06 DIAGNOSIS — K625 Hemorrhage of anus and rectum: Secondary | ICD-10-CM | POA: Diagnosis not present

## 2014-09-06 DIAGNOSIS — R1032 Left lower quadrant pain: Secondary | ICD-10-CM | POA: Diagnosis not present

## 2014-09-06 DIAGNOSIS — Z8719 Personal history of other diseases of the digestive system: Secondary | ICD-10-CM | POA: Insufficient documentation

## 2014-09-06 DIAGNOSIS — Z950 Presence of cardiac pacemaker: Secondary | ICD-10-CM | POA: Diagnosis not present

## 2014-09-06 DIAGNOSIS — K5730 Diverticulosis of large intestine without perforation or abscess without bleeding: Secondary | ICD-10-CM | POA: Diagnosis not present

## 2014-09-06 DIAGNOSIS — Z86010 Personal history of colonic polyps: Secondary | ICD-10-CM | POA: Insufficient documentation

## 2014-09-06 DIAGNOSIS — Z79899 Other long term (current) drug therapy: Secondary | ICD-10-CM | POA: Diagnosis not present

## 2014-09-06 DIAGNOSIS — E785 Hyperlipidemia, unspecified: Secondary | ICD-10-CM | POA: Insufficient documentation

## 2014-09-06 DIAGNOSIS — Z8619 Personal history of other infectious and parasitic diseases: Secondary | ICD-10-CM | POA: Diagnosis not present

## 2014-09-06 DIAGNOSIS — E039 Hypothyroidism, unspecified: Secondary | ICD-10-CM | POA: Diagnosis not present

## 2014-09-06 DIAGNOSIS — R1030 Lower abdominal pain, unspecified: Secondary | ICD-10-CM | POA: Insufficient documentation

## 2014-09-06 DIAGNOSIS — K7689 Other specified diseases of liver: Secondary | ICD-10-CM | POA: Diagnosis not present

## 2014-09-06 HISTORY — DX: Heart failure, unspecified: I50.9

## 2014-09-06 LAB — COMPREHENSIVE METABOLIC PANEL
ALT: 17 U/L (ref 14–54)
ANION GAP: 7 (ref 5–15)
AST: 20 U/L (ref 15–41)
Albumin: 3.6 g/dL (ref 3.5–5.0)
Alkaline Phosphatase: 51 U/L (ref 38–126)
BILIRUBIN TOTAL: 1.6 mg/dL — AB (ref 0.3–1.2)
BUN: 18 mg/dL (ref 6–20)
CALCIUM: 8.8 mg/dL — AB (ref 8.9–10.3)
CO2: 26 mmol/L (ref 22–32)
Chloride: 100 mmol/L — ABNORMAL LOW (ref 101–111)
Creatinine, Ser: 1.16 mg/dL — ABNORMAL HIGH (ref 0.44–1.00)
GFR calc Af Amer: 49 mL/min — ABNORMAL LOW (ref >60)
GFR calc non Af Amer: 43 mL/min — ABNORMAL LOW (ref >60)
Glucose, Bld: 102 mg/dL — ABNORMAL HIGH (ref 65–99)
POTASSIUM: 4.3 mmol/L (ref 3.5–5.1)
SODIUM: 133 mmol/L — AB (ref 135–145)
TOTAL PROTEIN: 6.7 g/dL (ref 6.5–8.1)

## 2014-09-06 LAB — CBC
HCT: 36 % (ref 36.0–46.0)
HEMOGLOBIN: 12.3 g/dL (ref 12.0–15.0)
MCH: 29.6 pg (ref 26.0–34.0)
MCHC: 34.2 g/dL (ref 30.0–36.0)
MCV: 86.7 fL (ref 78.0–100.0)
PLATELETS: 171 10*3/uL (ref 150–400)
RBC: 4.15 MIL/uL (ref 3.87–5.11)
RDW: 14.7 % (ref 11.5–15.5)
WBC: 6.1 10*3/uL (ref 4.0–10.5)

## 2014-09-06 LAB — ABO/RH: ABO/RH(D): O POS

## 2014-09-06 LAB — TYPE AND SCREEN
ABO/RH(D): O POS
ANTIBODY SCREEN: NEGATIVE

## 2014-09-06 LAB — PROTIME-INR
INR: 1.78 — ABNORMAL HIGH (ref 0.00–1.49)
Prothrombin Time: 20.7 seconds — ABNORMAL HIGH (ref 11.6–15.2)

## 2014-09-06 LAB — POC OCCULT BLOOD, ED: FECAL OCCULT BLD: NEGATIVE

## 2014-09-06 MED ORDER — IOHEXOL 300 MG/ML  SOLN
25.0000 mL | Freq: Once | INTRAMUSCULAR | Status: AC | PRN
  Administered 2014-09-06: 25 mL via ORAL

## 2014-09-06 MED ORDER — IOHEXOL 300 MG/ML  SOLN
80.0000 mL | Freq: Once | INTRAMUSCULAR | Status: AC | PRN
  Administered 2014-09-06: 80 mL via INTRAVENOUS

## 2014-09-06 NOTE — ED Notes (Signed)
Patient transported to CT 

## 2014-09-06 NOTE — ED Provider Notes (Signed)
CSN: [id number]     Arrival date & time 09/06/14  0151 History   This chart was scribed for Michele Flemings, MD by Chester Holstein, ED Scribe. This patient was seen in room A06C/A06C and the patient's care was started at 3:49 AM.    Chief Complaint  Patient presents with  . Rectal Bleeding  . Vaginal Bleeding     The history is provided by the patient. No language interpreter was used.   HPI Comments: Michele [name] is a 81 y.o. female with PMHx of diverticulosis, diverticulitis, C. Difficile colitis, AFib, HLD, thyroid disease, cardiomyopathy, Gilbert's syndrome, and colonic polyp who presents to the Emergency Department complaining of anal bleeding with onset this evening. She reports she went to the restroom and noticed blood which she believes is coming from her anus. Pt did not defecate at onset. She notes associated aching low abdominal pain. Pt reports h/o ICD and is on Coumadin, levels last checked 3 weeks ago. Pt presenting with rash to buttocks she states onset began after wearing a new pair of pants to gym. She denies chest pain and SOB.     Past Medical History  Diagnosis Date  . Cardiomyopathy     RATE RELATED  . Diverticulosis     DIVERTICULITIS  . C. difficile colitis   . Atrial fibrillation     NEG STRESS CARDIOLITE  . Gilbert's syndrome   . Hyperlipidemia     NMR LIOPROFILE LDL 234(3206/2234)HDL 37,TG86  . Thyroid disease     HYPOTHYROIDISM...THYROID NODULES  . Diverticulitis     PMH of X 2  . Hx of colonic polyp 2011    Dr Fuller Plan   Past Surgical History  Procedure Laterality Date  . Biopsy thyroid      SINGLE NODULE  . Throidectomy 12/08      benign  . Appendectomy  1956  . Cholecystectomy  1972  . Abdominal hysterectomy  1973  . Pacemaker placement      10/2006  . Icd....02/2008    . Breast biopsy  1997  . Rotator cuff repair      x2  . Colonoscopy with polypectomy  2011  . Cardiac defibrillator placement    . Lead revision N/A 08/21/2011    Procedure:  LEAD REVISION;  Surgeon: Deboraha Sprang, MD;  Location: [facility] CATH LAB;  Service: Cardiovascular;  Laterality: N/A;   Family History  Problem Relation Age of Onset  . Heart attack Mother 73  . Heart attack Brother 29  . Lung disease Father     Silicosis  . Vasculitis Sister     Giant Cell Arteritis  . Ulcerative colitis Son    History  Substance Use Topics  . Smoking status: Never Smoker   . Smokeless tobacco: Not on file  . Alcohol Use: No   OB History    No data available     Review of Systems  Constitutional: Negative for fever.  Respiratory: Negative for shortness of breath.   Cardiovascular: Negative for chest pain.  Gastrointestinal: Positive for hematochezia and anal bleeding.  Hematological: Bruises/bleeds easily (on Coumadin).      Allergies  Cephalexin; Erythromycin; Terbinafine hcl; Colchicine; and Rosuvastatin  Home Medications   Prior to Admission medications   Medication Sig Start Date End Date Taking? Authorizing Provider  cholecalciferol (VITAMIN D) 1000 UNITS tablet Take 1,000 Units by mouth daily.    Historical Provider, MD  co-enzyme Q-10 30 MG capsule Take 30 mg by mouth daily.  Historical Provider, MD  furosemide (LASIX) 20 MG tablet TAKE 1 TABLET ONCE DAILY AS NEEDED FOR FLUID. 07/31/14   Deboraha Sprang, MD  Multiple Vitamin (MULTIVITAMIN WITH MINERALS) TABS Take 1 tablet by mouth daily.    Historical Provider, MD  saccharomyces boulardii (FLORASTOR) 250 MG capsule Take 250 mg by mouth daily.      Historical Provider, MD  SYNTHROID 112 MCG tablet TAKE 1 TABLET DAILY EXCEPT 1/2 TABLET ON WEDNESDAYS. 08/25/14   Hendricks Limes, MD  VYTORIN 10-20 MG per tablet TAKE ONE TABLET AT BEDTIME. 06/13/14   Hendricks Limes, MD  warfarin (COUMADIN) 5 MG tablet Take as directed by anticoagulation clinic 05/08/14   Hendricks Limes, MD   BP 140/77 mmHg  Temp(Src) 97.5 F (36.4 C) (Oral)  Resp 20  SpO2 97% Physical Exam  Constitutional: She is oriented to  person, place, and time. She appears well-developed and well-nourished.  HENT:  Head: Normocephalic and atraumatic.  Nose: Nose normal.  Mouth/Throat: Oropharynx is clear and moist.  Eyes: Conjunctivae and EOM are normal. Pupils are equal, round, and reactive to light.  Neck: Normal range of motion. Neck supple. No JVD present. No tracheal deviation present. No thyromegaly present.  Cardiovascular: Normal rate, regular rhythm, normal heart sounds and intact distal pulses.  Exam reveals no gallop and no friction rub.   No murmur heard. Pulmonary/Chest: Effort normal and breath sounds normal. No stridor. No respiratory distress. She has no wheezes. She has no rales. She exhibits no tenderness.  Abdominal: Soft. Bowel sounds are normal. She exhibits no distension and no mass. There is tenderness (patient is tender in left lower quadrant and suprapubic). There is no rebound and no guarding.  Musculoskeletal: Normal range of motion. She exhibits no edema or tenderness.  Lymphadenopathy:    She has no cervical adenopathy.  Neurological: She is alert and oriented to person, place, and time. She displays normal reflexes. She exhibits normal muscle tone. Coordination normal.  Skin: Skin is warm and dry. No rash noted. No erythema. No pallor.  Psychiatric: She has a normal mood and affect. Her behavior is normal. Judgment and thought content normal.  Nursing note and vitals reviewed.   ED Course  Procedures (including critical care time) DIAGNOSTIC STUDIES: Oxygen Saturation is 97% on room air, normal by my interpretation.    COORDINATION OF CARE: 3:56 AM Discussed treatment plan with patient at beside, the patient agrees with the plan and has no further questions at this time.   Labs Review Labs Reviewed  COMPREHENSIVE METABOLIC PANEL - Abnormal; Notable for the following:    Sodium 133 (*)    Chloride 100 (*)    Glucose, Bld 102 (*)    Creatinine, Ser 1.16 (*)    Calcium 8.8 (*)    Total  Bilirubin 1.6 (*)    GFR calc non Af Amer 43 (*)    GFR calc Af Amer 49 (*)    All other components within normal limits  CBC  POC OCCULT BLOOD, ED  TYPE AND SCREEN  ABO/RH    Imaging Review Ct Abdomen Pelvis W Contrast  09/06/2014   CLINICAL DATA:  Acute onset of anal bleeding. Lower abdominal pain. Patient on Coumadin. Initial encounter.  EXAM: CT ABDOMEN AND PELVIS WITH CONTRAST  TECHNIQUE: Multidetector CT imaging of the abdomen and pelvis was performed using the standard protocol following bolus administration of intravenous contrast.  CONTRAST:  59mL OMNIPAQUE IOHEXOL 300 MG/ML  SOLN  COMPARISON:  CT  of the abdomen and pelvis from 06/01/2013  FINDINGS: Mild bibasilar atelectasis or scarring is noted. Pacemaker leads are partially imaged. Diffuse coronary artery calcifications are seen.  A 1.5 cm cyst is noted at the right hepatic lobe. The liver and spleen are otherwise unremarkable. The patient is status post cholecystectomy. The pancreas and adrenal glands are unremarkable.  The kidneys are unremarkable in appearance. There is no evidence of hydronephrosis. No renal or ureteral stones are seen. No perinephric stranding is appreciated.  No free fluid is identified. The small bowel is unremarkable in appearance. The stomach is within normal limits. No acute vascular abnormalities are seen. Scattered calcification is noted along the abdominal aorta and its branches.  The patient is status post appendectomy. Scattered diverticulosis is noted along the sigmoid colon, without evidence of diverticulitis.  The bladder is moderately distended and grossly unremarkable. The patient is status post hysterectomy. No suspicious adnexal masses are seen. The ovaries appear grossly symmetric. No inguinal lymphadenopathy is seen.  No acute osseous abnormalities are identified. Multilevel vacuum phenomenon is noted along the lower thoracic and lumbar spine. Underlying facet disease is noted.  IMPRESSION: 1. No  definite abnormalities seen to explain the patient's anal bleeding. 2. Scattered diverticulosis along the sigmoid colon, without evidence of diverticulitis. 3. Mild bibasilar atelectasis or scarring noted. 4. Small hepatic cyst noted. 5. Diffuse coronary artery calcifications seen. 6. Scattered calcification along the abdominal aorta and its branches. 7. Minimal degenerative change along the lower thoracic and lumbar spine.   Electronically Signed   By: Garald Balding M.D.   On: 09/06/2014 07:01     EKG Interpretation None      MDM   Final diagnoses:  Rectal bleeding    I personally performed the services described in this documentation, which was scribed in my presence. The recorded information has been reviewed and is accurate.  81 year old female with several episodes of bright red blood per rectum and left lower quadrant crampy abdominal pain molar to prior episodes of diverticulitis.  No blood on glove with rectal exam, heme negative.  She does have a drop in her hemoglobin of 2. since her last evaluation in June 2 weeks ago.  INR subtherapeutic at 1.78.  Plan for CT abdomen pelvis to evaluate for diverticulitis.  CT scan reviewed, no signs of diverticulitis.  Offered obs admission, but declined due to not being covered by insurance.  They were informed to watch for continued rectal bleeding (no bleeding since being here, hemocult negative)    Michele Flemings, MD 09/06/14 [phone]

## 2014-09-06 NOTE — Discharge Instructions (Signed)
Your workup today has not shown a specific cause of your symptoms.  If you have further episodes of rectal bleeding, weakness, dizziness, chest pain or shortness of breath or other new concerning symptoms, please return to the emergency department.  Follow-up with your primary care Dr. and your gastroenterologist for further evaluation.   Bloody Stools Bloody stools often mean that there is a problem in the digestive tract. Your caregiver may use the term "melena" to describe black, tarry, and bad smelling stools or "hematochezia" to describe red or maroon-colored stools. Blood seen in the stool can be caused by bleeding anywhere along the intestinal tract.  A black stool usually means that blood is coming from the upper part of the gastrointestinal tract (esophagus, stomach, or small bowel). Passing maroon-colored stools or bright red blood usually means that blood is coming from lower down in the large bowel or the rectum. However, sometimes massive bleeding in the stomach or small intestine can cause bright red bloody stools.  Consuming black licorice, lead, iron pills, medicines containing bismuth subsalicylate, or blueberries can also cause black stools. Your caregiver can test black stools to see if blood is present. It is important that the cause of the bleeding be found. Treatment can then be started, and the problem can be corrected. Rectal bleeding may not be serious, but you should not assume everything is okay until you know the cause.It is very important to follow up with your caregiver or a specialist in gastrointestinal problems. CAUSES  Blood in the stools can come from various underlying causes.Often, the cause is not found during your first visit. Testing is often needed to discover the cause of bleeding in the gastrointestinal tract. Causes range from simple to serious or even life-threatening.Possible causes include:  Hemorrhoids.These are veins that are full of blood (engorged) in  the rectum. They cause pain, inflammation, and may bleed.  Anal fissures.These are areas of painful tearing which may bleed. They are often caused by passing hard stool.  Diverticulosis.These are pouches that form on the colon over time, with age, and may bleed significantly.  Diverticulitis.This is inflammation in areas with diverticulosis. It can cause pain, fever, and bloody stools, although bleeding is rare.  Proctitis and colitis. These are inflamed areas of the rectum or colon. They may cause pain, fever, and bloody stools.  Polyps and cancer. Colon cancer is a leading cause of preventable cancer death.It often starts out as precancerous polyps that can be removed during a colonoscopy, preventing progression into cancer. Sometimes, polyps and cancer may cause rectal bleeding.  Gastritis and ulcers.Bleeding from the upper gastrointestinal tract (near the stomach) may travel through the intestines and produce black, sometimes tarry, often bad smelling stools. In certain cases, if the bleeding is fast enough, the stools may not be black, but red and the condition may be life-threatening. SYMPTOMS  You may have stools that are bright red and bloody, that are normal color with blood on them, or that are dark black and tarry. In some cases, you may only have blood in the toilet bowl. Any of these cases need medical care. You may also have:  Pain at the anus or anywhere in the rectum.  Lightheadedness or feeling faint.  Extreme weakness.  Nausea or vomiting.  Fever. DIAGNOSIS Your caregiver may use the following methods to find the cause of your bleeding:  Taking a medical history. Age is important. Older people tend to develop polyps and cancer more often. If there is anal pain  and a hard, large stool associated with bleeding, a tear of the anus may be the cause. If blood drips into the toilet after a bowel movement, bleeding hemorrhoids may be the problem. The color and frequency of  the bleeding are additional considerations. In most cases, the medical history provides clues, but seldom the final answer.  A visual and finger (digital) exam. Your caregiver will inspect the anal area, looking for tears and hemorrhoids. A finger exam can provide information when there is tenderness or a growth inside. In men, the prostate is also examined.  Endoscopy. Several types of small, long scopes (endoscopes) are used to view the colon.  In the office, your caregiver may use a rigid, or more commonly, a flexible viewing sigmoidoscope. This exam is called flexible sigmoidoscopy. It is performed in 5 to 10 minutes.  A more thorough exam is accomplished with a colonoscope. It allows your caregiver to view the entire 5 to 6 foot long colon. Medicine to help you relax (sedative) is usually given for this exam. Frequently, a bleeding lesion may be present beyond the reach of the sigmoidoscope. So, a colonoscopy may be the best exam to start with. Both exams are usually done on an outpatient basis. This means the patient does not stay overnight in the hospital or surgery center.  An upper endoscopy may be needed to examine your stomach. Sedation is used and a flexible endoscope is put in your mouth, down to your stomach.  A barium enema X-ray. This is an X-ray exam. It uses liquid barium inserted by enema into the rectum. This test alone may not identify an actual bleeding point. X-rays highlight abnormal shadows, such as those made by lumps (tumors), diverticuli, or colitis. TREATMENT  Treatment depends on the cause of your bleeding.   For bleeding from the stomach or colon, the caregiver doing your endoscopy or colonoscopy may be able to stop the bleeding as part of the procedure.  Inflammation or infection of the colon can be treated with medicines.  Many rectal problems can be treated with creams, suppositories, or warm baths.  Surgery is sometimes needed.  Blood transfusions are  sometimes needed if you have lost a lot of blood.  For any bleeding problem, let your caregiver know if you take aspirin or other blood thinners regularly. HOME CARE INSTRUCTIONS   Take any medicines exactly as prescribed.  Keep your stools soft by eating a diet high in fiber. Prunes (1 to 3 a day) work well for many people.  Drink enough water and fluids to keep your urine clear or pale yellow.  Take sitz baths if advised. A sitz bath is when you sit in a bathtub with warm water for 10 to 15 minutes to soak, soothe, and cleanse the rectal area.  If enemas or suppositories are advised, be sure you know how to use them. Tell your caregiver if you have problems with this.  Monitor your bowel movements to look for signs of improvement or worsening. SEEK MEDICAL CARE IF:   You do not improve in the time expected.  Your condition worsens after initial improvement.  You develop any new symptoms. SEEK IMMEDIATE MEDICAL CARE IF:   You develop severe or prolonged rectal bleeding.  You vomit blood.  You feel weak or faint.  You have a fever. MAKE SURE YOU:  Understand these instructions.  Will watch your condition.  Will get help right away if you are not doing well or get worse. Document Released:  01/24/2002 Document Revised: 04/28/2011 Document Reviewed: 06/21/2010 Exit[employer] Patient Information 2015 [place], [place]. This information is not intended to replace advice given to you by your health care provider. Make sure you discuss any questions you have with your health care provider.

## 2014-09-06 NOTE — ED Notes (Signed)
Pt. reports bleeding at vagina/restum onset this evening , denies injury , mild low abdominal pain , pt. is taking Coumadin . Respirations unlabored / alert and oriented.

## 2014-09-15 ENCOUNTER — Ambulatory Visit (INDEPENDENT_AMBULATORY_CARE_PROVIDER_SITE_OTHER): Payer: Medicare Other | Admitting: *Deleted

## 2014-09-15 ENCOUNTER — Encounter: Payer: Self-pay | Admitting: Internal Medicine

## 2014-09-15 ENCOUNTER — Ambulatory Visit (INDEPENDENT_AMBULATORY_CARE_PROVIDER_SITE_OTHER): Payer: Medicare Other | Admitting: Internal Medicine

## 2014-09-15 VITALS — BP 122/64 | HR 89 | Ht 63.5 in | Wt 147.6 lb

## 2014-09-15 DIAGNOSIS — I482 Chronic atrial fibrillation, unspecified: Secondary | ICD-10-CM

## 2014-09-15 DIAGNOSIS — I429 Cardiomyopathy, unspecified: Secondary | ICD-10-CM | POA: Diagnosis not present

## 2014-09-15 DIAGNOSIS — I4891 Unspecified atrial fibrillation: Secondary | ICD-10-CM

## 2014-09-15 DIAGNOSIS — Z5181 Encounter for therapeutic drug level monitoring: Secondary | ICD-10-CM

## 2014-09-15 LAB — POCT INR: INR: 1.5

## 2014-09-15 LAB — CUP PACEART INCLINIC DEVICE CHECK
Battery Voltage: 2.9 V
Brady Statistic AP VP Percent: 0 %
Brady Statistic AS VP Percent: 99.94 %
Brady Statistic RA Percent Paced: 0 %
Brady Statistic RV Percent Paced: 99.94 %
Date Time Interrogation Session: 20160729173911
HIGH POWER IMPEDANCE MEASURED VALUE: 50 Ohm
HighPow Impedance: 190 Ohm
HighPow Impedance: 380 Ohm
HighPow Impedance: 76 Ohm
Lead Channel Impedance Value: 456 Ohm
Lead Channel Impedance Value: 513 Ohm
Lead Channel Impedance Value: 532 Ohm
Lead Channel Impedance Value: 988 Ohm
Lead Channel Pacing Threshold Amplitude: 0.75 V
Lead Channel Pacing Threshold Amplitude: 1.25 V
Lead Channel Pacing Threshold Pulse Width: 0.4 ms
Lead Channel Setting Pacing Amplitude: 2.5 V
Lead Channel Setting Sensing Sensitivity: 0.3 mV
MDC IDC MSMT LEADCHNL LV IMPEDANCE VALUE: 627 Ohm
MDC IDC MSMT LEADCHNL LV PACING THRESHOLD PULSEWIDTH: 0.8 ms
MDC IDC MSMT LEADCHNL RA SENSING INTR AMPL: 1.625 mV
MDC IDC SET LEADCHNL LV PACING AMPLITUDE: 2.25 V
MDC IDC SET LEADCHNL LV PACING PULSEWIDTH: 0.8 ms
MDC IDC SET LEADCHNL RV PACING PULSEWIDTH: 0.4 ms
MDC IDC SET ZONE DETECTION INTERVAL: 210 ms
MDC IDC SET ZONE DETECTION INTERVAL: 250 ms
MDC IDC STAT BRADY AP VS PERCENT: 0 %
MDC IDC STAT BRADY AS VS PERCENT: 0.06 %
Zone Setting Detection Interval: 350 ms
Zone Setting Detection Interval: 360 ms
Zone Setting Detection Interval: 450 ms

## 2014-09-15 NOTE — Patient Instructions (Signed)
Medication Instructions:  Your physician recommends that you continue on your current medications as directed. Please refer to the Current Medication list given to you today.   Labwork: NONE  Testing/Procedures: NONE  Follow-Up: Your physician wants you to follow-up in: 12 months with Dr. Caryl Comes.  You will receive a reminder letter in the mail two months in advance. If you don't receive a letter, please call our office to schedule the follow-up appointment.   Any Other Special Instructions Will Be Listed Below (If Applicable).

## 2014-09-15 NOTE — Progress Notes (Signed)
Patient Care Team: Hendricks Limes, MD as PCP - General   HPI  Michele [name] is a 81 y.o. 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   Echo 2010 EF 55-60% improved from 25-30%  .  She has done quite well. The patient denies chest pain, shortness of breath, nocturnal dyspnea, orthopnea or peripheral edema.  There have been no palpitations, lightheadedness or syncope.    She was seen in the emergency room 09/06/14 because of bleeding which she thought might of been rectal. However, the nose examined from the emergency room physician (rectal examination was heme-negative suggesting an alternative source.      Her husband is recovering from his surgery  Past Medical History  Diagnosis Date  . Cardiomyopathy     RATE RELATED  . Diverticulosis     DIVERTICULITIS  . C. difficile colitis   . Atrial fibrillation     NEG STRESS CARDIOLITE  . Gilbert's syndrome   . Hyperlipidemia     NMR LIOPROFILE LDL 234(3206/2234)HDL 37,TG86  . Thyroid disease     HYPOTHYROIDISM...THYROID NODULES  . Diverticulitis     PMH of X 2  . Hx of colonic polyp 2011    Dr Fuller Plan  . CHF (congestive heart failure)     Past Surgical History  Procedure Laterality Date  . Biopsy thyroid      SINGLE NODULE  . Throidectomy 12/08      benign  . Appendectomy  1956  . Cholecystectomy  1972  . Abdominal hysterectomy  1973  . Pacemaker placement      10/2006  . Icd....02/2008    . Breast biopsy  1997  . Rotator cuff repair      x2  . Colonoscopy with polypectomy  2011  . Cardiac defibrillator placement    . Lead revision N/A 08/21/2011    Procedure: LEAD REVISION;  Surgeon: Deboraha Sprang, MD;  Location: [facility] CATH [facility];  Service: Cardiovascular;  Laterality: N/A;    Current Outpatient Prescriptions  Medication Sig Dispense Refill  .  cholecalciferol (VITAMIN D) 1000 UNITS tablet Take 1,000 Units by mouth daily.    Marland Kitchen co-enzyme Q-10 30 MG capsule Take 30 mg by mouth daily.    . furosemide (LASIX) 20 MG tablet TAKE 1 TABLET ONCE DAILY AS NEEDED FOR FLUID. 30 tablet 2  . Multiple Vitamin (MULTIVITAMIN WITH MINERALS) TABS Take 1 tablet by mouth daily.    Marland Kitchen saccharomyces boulardii (FLORASTOR) 250 MG capsule Take 250 mg by mouth daily.      Marland Kitchen SYNTHROID 112 MCG tablet TAKE 1 TABLET DAILY EXCEPT 1/2 TABLET ON WEDNESDAYS. 85 tablet 3  . VYTORIN 10-20 MG per tablet TAKE ONE TABLET AT BEDTIME. 30 tablet 5  . warfarin (COUMADIN) 5 MG tablet Take as directed by anticoagulation clinic (Patient taking differently: Take 2.5-5 mg by mouth See admin instructions. Take 1/2 tablet on Monday and Friday then take 1 tablet all the other days) 90 tablet 1   No current facility-administered medications for this visit.    Allergies  Allergen Reactions  . Cephalexin Hives  . Erythromycin Swelling and Other (See Comments)    REACTION: swollen and sore mouth  . Terbinafine Hcl     Rash   . Colchicine Other (See Comments)    REACTION: violent diarrhea  . Rosuvastatin Other (See Comments)    REACTION:  upper arm pain bilaterally    Review of Systems negative except from HPI and PMH  Physical Exam BP 122/64 mmHg  Pulse 89  Ht 5' 3.5" (1.613 m)  Wt 147 lb 9.6 oz (66.951 kg)  BMI 25.73 kg/m2 Well developed and nourished in no acute distress HENT normal Neck supple with JVP-flat Clear Regular rate and rhythm, no murmurs or gallops Device pocket well healed; without hematoma or erythema.  There is no tethering  Abd-soft with active BS No Clubbing cyanosis edema Skin-warm and dry A & Oriented  Grossly normal sensory and motor function  ECG demonstrates ventricular pacing with underlying atrial fibrillation    Assessment and  Plan Atrial fibrillation-permanent  Complete heart block  S/p AV ablation  Nonischemic cardiomyopathy  -resolved  Implantable defibrillator-biventricular-Medtronic The patient's device was interrogated.  The information was reviewed. No changes were made in the programming.    Bleeding ? Gynecological  She remains on anticoagulation.  Euvolemic continue current meds  GHave suggested following up weith Dr Ellamae Sia for presumed gyncoeolcogical bleeding    Continue current meds

## 2014-09-21 ENCOUNTER — Ambulatory Visit (INDEPENDENT_AMBULATORY_CARE_PROVIDER_SITE_OTHER): Payer: Medicare Other | Admitting: Internal Medicine

## 2014-09-21 ENCOUNTER — Encounter: Payer: Self-pay | Admitting: Internal Medicine

## 2014-09-21 VITALS — BP 118/74 | HR 88 | Temp 97.7°F | Resp 16 | Wt 145.0 lb

## 2014-09-21 DIAGNOSIS — R42 Dizziness and giddiness: Secondary | ICD-10-CM | POA: Diagnosis not present

## 2014-09-21 DIAGNOSIS — H8111 Benign paroxysmal vertigo, right ear: Secondary | ICD-10-CM

## 2014-09-21 NOTE — Progress Notes (Signed)
Pre visit review using our clinic review tool, if applicable. No additional management support is needed unless otherwise documented below in the visit note. 

## 2014-09-21 NOTE — Patient Instructions (Addendum)
Please try sleeping with 2 pillows or wedge.  Perform isometric exercise of calves  ( while seated go up on toes to count of 5 & then onto heels for 5 count). Repeat  4- 5 times prior to standing if you've been seated or supine for any significant period of time as BP drops with such positions.

## 2014-09-21 NOTE — Progress Notes (Signed)
   Subjective:    Patient ID: Michele [name], female    DOB: 1929-07-10, 81 y.o.   MRN: [id number]  HPI She was seen by the ENT specialist and impactions removed. They could not do the testing for positional vertigo as the machine to test nystagmus was broken. They referred her back to [place]. She's continued to have symptoms at night when she turns in bed or lies supine. This is aggravated when she gets up to go the bathroom. There is no cardiac or neurologic prodrome prior to the symptoms.  The symptoms had improved for 2 weeks but now they're recurring during the night and persist into the morning until 9-11 AM. They will return if she is supine.  She was in the ER 09/06/14 for blood in the toilet water. Her hemoglobin was 12.3 & hematocrit 36. Hemoccult testing was negative. CT revealed no diverticulitis.  Despite being on warfarin she denies any other bleeding dyscrasias. On 7/2 20 her INR was 1.78. On 7/29 it was 1.5.   She has decreased her thyroid supplement as recommended. Her TSH was 0.12 on 08/11/14   Review of Systems  Denied were any change in heart rhythm or rate prior to the event. There was no associated chest pain or shortness of breath .  Also specifically denied prior to the episode were headache, limb weakness, tingling, or numbness. No seizure activity noted.  Epistaxis, hemoptysis, hematuria, melena, or definite rectal bleeding denied. No unexplained weight loss, significant dyspepsia,dysphagia, or abdominal pain.  There is no abnormal bruising , bleeding, or difficulty stopping bleeding with injury.     Objective:   Physical Exam  Pertinent or positive findings include:  Cranial nerve exam is unremarkable.. Benign positional vertigo testing was positive to the right. Cranial nerve exam is unremarkable She does have minor DIP osteoarthritic changes in the hands.   General appearance :adequately nourished; in no distress.  Eyes: No conjunctival inflammation or  scleral icterus is present.  Oral exam:  Lips and gums are healthy appearing.There is no oropharyngeal erythema or exudate noted. Dental hygiene is good.  Heart:  Normal rate and regular rhythm. S1 and S2 normal without gallop, murmur, click, rub or other extra sounds    Lungs:Chest clear to auscultation; no wheezes, rhonchi,rales ,or rubs present.No increased work of breathing.    Vascular : all pulses equal ; no bruits present.  Skin:Warm & dry.  Intact without suspicious lesions or rashes ; no tenting or jaundice   Lymphatic: No lymphadenopathy is noted about the head, neck, axilla   Neuro: Strength, tone & DTRs normal.        Assessment & Plan:  #1 benign positional vertigo  #2 component of some postural hypotension as well  #3 blood in the toilet water with negative heme occult testing and absence of significant anemia. Gynecologic follow-up has been scheduled.  #4 warfarin therapy; PT/INRs have not been therapeutic. They were not excessive at the time of the blood being noted in the toilet water.  Plan: See orders she will be referred to physical therapy. If symptoms persist reassessment by ENT would be recommended. She's been asked to employ a wedge to help prevent the symptoms which initiate when she lies supine.

## 2014-09-27 ENCOUNTER — Ambulatory Visit (INDEPENDENT_AMBULATORY_CARE_PROVIDER_SITE_OTHER): Payer: Medicare Other | Admitting: Pharmacist

## 2014-09-27 DIAGNOSIS — I4891 Unspecified atrial fibrillation: Secondary | ICD-10-CM

## 2014-09-27 DIAGNOSIS — I482 Chronic atrial fibrillation, unspecified: Secondary | ICD-10-CM

## 2014-09-27 DIAGNOSIS — Z5181 Encounter for therapeutic drug level monitoring: Secondary | ICD-10-CM

## 2014-09-27 LAB — POCT INR: INR: 1.4

## 2014-10-10 DIAGNOSIS — R102 Pelvic and perineal pain: Secondary | ICD-10-CM | POA: Diagnosis not present

## 2014-10-10 DIAGNOSIS — N950 Postmenopausal bleeding: Secondary | ICD-10-CM | POA: Diagnosis not present

## 2014-10-13 ENCOUNTER — Telehealth: Payer: Self-pay | Admitting: Internal Medicine

## 2014-10-13 NOTE — Telephone Encounter (Signed)
Benign positional vertigo (BPV) is treated by Physical therapy with specific exercises & maneuvers

## 2014-10-13 NOTE — Telephone Encounter (Signed)
Please call patient's husband. He's having a problem with the referral that was put in for her. It's set up for physical therapy. She wants to be tested for vertigo. Can you call him so he can understand what is needed

## 2014-10-13 NOTE — Telephone Encounter (Signed)
error 

## 2014-10-13 NOTE — Telephone Encounter (Signed)
Please advise 

## 2014-10-16 ENCOUNTER — Ambulatory Visit (INDEPENDENT_AMBULATORY_CARE_PROVIDER_SITE_OTHER): Payer: Medicare Other | Admitting: Pharmacist

## 2014-10-16 DIAGNOSIS — I482 Chronic atrial fibrillation, unspecified: Secondary | ICD-10-CM

## 2014-10-16 DIAGNOSIS — Z5181 Encounter for therapeutic drug level monitoring: Secondary | ICD-10-CM

## 2014-10-16 DIAGNOSIS — I4891 Unspecified atrial fibrillation: Secondary | ICD-10-CM | POA: Diagnosis not present

## 2014-10-16 LAB — POCT INR: INR: 2.4

## 2014-10-16 NOTE — Telephone Encounter (Signed)
Spoke with pt and informed her that the PT was for the Vertigo DX. Spoke with [place] and she stated that [facility] was extremely packed up and pt was given the phone number to call and speak with [facility].

## 2014-10-18 DIAGNOSIS — Z1231 Encounter for screening mammogram for malignant neoplasm of breast: Secondary | ICD-10-CM | POA: Diagnosis not present

## 2014-10-20 ENCOUNTER — Telehealth: Payer: Self-pay | Admitting: Internal Medicine

## 2014-10-20 NOTE — Telephone Encounter (Signed)
Pt aware that her transmission is received.

## 2014-10-20 NOTE — Telephone Encounter (Signed)
New message     Pt has been speaking with [employer] to get device operational.   Pt wanted to let us know that if we receive any transmission it is for testing purposes

## 2014-11-06 ENCOUNTER — Ambulatory Visit (INDEPENDENT_AMBULATORY_CARE_PROVIDER_SITE_OTHER): Payer: Medicare Other

## 2014-11-06 DIAGNOSIS — I4891 Unspecified atrial fibrillation: Secondary | ICD-10-CM | POA: Diagnosis not present

## 2014-11-06 DIAGNOSIS — I482 Chronic atrial fibrillation, unspecified: Secondary | ICD-10-CM

## 2014-11-06 DIAGNOSIS — Z5181 Encounter for therapeutic drug level monitoring: Secondary | ICD-10-CM | POA: Diagnosis not present

## 2014-11-06 LAB — POCT INR: INR: 1.9

## 2014-11-07 ENCOUNTER — Ambulatory Visit: Payer: Self-pay

## 2014-11-14 ENCOUNTER — Ambulatory Visit: Payer: Medicare Other | Attending: Internal Medicine | Admitting: Physical Therapy

## 2014-11-14 ENCOUNTER — Encounter: Payer: Self-pay | Admitting: Rehabilitative and Restorative Service Providers"

## 2014-11-14 DIAGNOSIS — H8111 Benign paroxysmal vertigo, right ear: Secondary | ICD-10-CM | POA: Diagnosis not present

## 2014-11-17 ENCOUNTER — Encounter: Payer: Self-pay | Admitting: Physical Therapy

## 2014-11-17 NOTE — Patient Instructions (Addendum)
Benign Positional Vertigo Vertigo means you feel like you or your surroundings are moving when they are not. Benign positional vertigo is the most common form of vertigo. Benign means that the cause of your condition is not serious. Benign positional vertigo is more common in older adults. CAUSES  Benign positional vertigo is the result of an upset in the labyrinth system. This is an area in the middle ear that helps control your balance. This may be caused by a viral infection, head injury, or repetitive motion. However, often no specific cause is found. SYMPTOMS  Symptoms of benign positional vertigo occur when you move your head or eyes in different directions. Some of the symptoms may include:  Loss of balance and falls.  Vomiting.  Blurred vision.  Dizziness.  Nausea.  Involuntary eye movements (nystagmus). DIAGNOSIS  Benign positional vertigo is usually diagnosed by physical exam. If the specific cause of your benign positional vertigo is unknown, your caregiver may perform imaging tests, such as magnetic resonance imaging (MRI) or computed tomography (CT). TREATMENT  Your caregiver may recommend movements or procedures to correct the benign positional vertigo. Medicines such as meclizine, benzodiazepines, and medicines for nausea may be used to treat your symptoms. In rare cases, if your symptoms are caused by certain conditions that affect the inner ear, you may need surgery. HOME CARE INSTRUCTIONS   Follow your caregiver's instructions.  Move slowly. Do not make sudden body or head movements.  Avoid driving.  Avoid operating heavy machinery.  Avoid performing any tasks that would be dangerous to you or others during a vertigo episode.  Drink enough fluids to keep your urine clear or pale yellow. SEEK IMMEDIATE MEDICAL CARE IF:   You develop problems with walking, weakness, numbness, or using your arms, hands, or legs.  You have difficulty speaking.  You develop  severe headaches.  Your nausea or vomiting continues or gets worse.  You develop visual changes.  Your family or friends notice any behavioral changes.  Your condition gets worse.  You have a fever.  You develop a stiff neck or sensitivity to light. MAKE SURE YOU:   Understand these instructions.  Will watch your condition.  Will get help right away if you are not doing well or get worse. Document Released: 11/11/2005 Document Revised: 04/28/2011 Document Reviewed: 10/24/2010 [facility] Patient Information 2015 [place], [place]. This information is not intended to replace advice given to you by your health care provider. Make sure you discuss any questions you have with your health care provider. Sit to Side-Lying    Sit on edge of bed. 1. Turn head 45 to right. 2. Maintain head position and lie down slowly on left side. Hold until symptoms subside. 3. Sit up slowly. Hold until symptoms subside. 4. Turn head 45 to left. 5. Maintain head position and lie down slowly on right side. Hold until symptoms subside. 6. Sit up slowly. Repeat sequence _5___ times per session. Do _3___ sessions per day.  Copyright  VHI. All rights reserved.

## 2014-11-17 NOTE — Therapy (Signed)
[facility] [address] [place], [place], [address] Phone: [phone]   Fax:  [phone]  Physical Therapy Evaluation  Patient Details  Name: Michele [name] MRN: [id number] Date of Birth: [birth year] Referring Provider:  Hendricks Limes, MD  Encounter Date: 11/14/2014      PT End of Session - 11/17/14 1035    Visit Number 1   Number of Visits 4   Date for PT Re-Evaluation 12/14/14   Authorization Type [employer] Medicare   Authorization Time Period 11-14-14 - 01-13-15   PT Start Time 7893   PT Stop Time 8101   PT Time Calculation (min) 46 min      Past Medical History  Diagnosis Date  . Cardiomyopathy     RATE RELATED  . Diverticulosis     DIVERTICULITIS  . C. difficile colitis   . Atrial fibrillation     NEG STRESS CARDIOLITE  . Gilbert's syndrome   . Hyperlipidemia     NMR LIOPROFILE LDL 234(3206/2234)HDL 37,TG86  . Thyroid disease     HYPOTHYROIDISM...THYROID NODULES  . Diverticulitis     PMH of X 2  . Hx of colonic polyp 2011    Dr Fuller Plan  . CHF (congestive heart failure)     Past Surgical History  Procedure Laterality Date  . Biopsy thyroid      SINGLE NODULE  . Throidectomy 12/08      benign  . Appendectomy  1956  . Cholecystectomy  1972  . Abdominal hysterectomy  1973  . Pacemaker placement      10/2006  . Icd....02/2008    . Breast biopsy  1997  . Rotator cuff repair      x2  . Colonoscopy with polypectomy  2011  . Cardiac defibrillator placement    . Lead revision N/A 08/21/2011    Procedure: LEAD REVISION;  Surgeon: Deboraha Sprang, MD;  Location: [facility] CATH LAB;  Service: Cardiovascular;  Laterality: N/A;    There were no vitals filed for this visit.  Visit Diagnosis:  BPPV (benign paroxysmal positional vertigo), right - Plan: PT plan of care cert/re-cert      Subjective Assessment - 11/17/14 1011    Subjective Pt. reports vertigo with looking up and with getting up out of bed in  the mornings; states it has improved since onset in early May 2016; states that vertigo episodes come and go   Pertinent History atrial fibrillation; CHF   Patient Stated Goals resolve the vertigo   Currently in Pain? No/denies            [facility] PT Assessment - 11/17/14 0001    Assessment   Medical Diagnosis BPPV   Onset Date/Surgical Date --  May 2016   Precautions   Precautions Fall  fall risk if vertigo is occurring   Balance Screen   Has the patient fallen in the past 6 months No   Has the patient had a decrease in activity level because of a fear of falling?  No   Is the patient reluctant to leave their home because of a fear of falling?  No   Home Environment   Living Environment Private residence   Type of [place] to enter   Entrance Stairs-Number of Steps 2   [place] One level   Prior Function   Level of Independence Independent   Observation/Other Assessments   Focus on Therapeutic Outcomes (FOTO)  staff did not captured  Vestibular Assessment - 11/17/14 0001    Vestibular Assessment   General Observation pt is an 81 yr old lady with c/o intermittent vertigo with initial episode having startedin May 2016   Symptom Behavior   Type of Dizziness Spinning   Frequency of Dizziness occurs with certain movements   Duration of Dizziness secs   Aggravating Factors Sitting with head tilted back;Rolling to right  lying down   Relieving Factors Lying supine;Closing eyes   Occulomotor Exam   Occulomotor Alignment Normal   Smooth Pursuits Intact   Positional Sensitivities   Right Hallpike Mild dizziness   Up from Right Hallpike Mild dizziness   Rolling Right Mild dizziness   Rolling Left No dizziness       Epley maneuver performed 3 reps for R BPPV with very minimal c/o vertigo on 3rd rep - suggestive that R BPPV Resolved with this treatment                 PT Education - 11/17/14 1035    Education provided  Yes   Education Details BPPV education and habituation exercises   Person(s) Educated Patient   Methods Explanation;Demonstration;Handout   Comprehension Verbalized understanding;Returned demonstration             PT Long Term Goals - 11/17/14 1039    PT LONG TERM GOAL #1   Title Pt. will report no vertigo with bed mobility or with ambulation.  (12-14-14)   Time 4   Period Weeks   Status New   PT LONG TERM GOAL #2   Title Pt will have a (-) R Dix-Hallpike test to indicate that R BPPV has resolved.  (12-14-14)   Time 4   Period Weeks   Status New   PT LONG TERM GOAL #3   Title Independent in HEP for Brandt-Daroff exercises if needed.  (12-14-14)   Time 4   Period Weeks   Status New               Plan - 11/17/14 1037    Clinical Impression Statement Pt. has signs and symptoms consistent with R BPPV - vertigo appeared to be mostly resolved per pt report on 3rd rep of Epley maneuver   Pt will benefit from skilled therapeutic intervention in order to improve on the following deficits Difficulty walking;Dizziness;Decreased balance   Rehab Potential Good   PT Frequency 1x / week   PT Duration 4 weeks   PT Treatment/Interventions ADLs/Self Care Home Management;Canalith Repostioning;Therapeutic exercise;Balance training;Neuromuscular re-education;Patient/family education;Gait training   PT Next Visit Plan check R Dix-Hallpike - perform Epley's prn for R BPPV   PT Home Exercise Plan Laruth Bouchard- Daroff prn   Consulted and Agree with Plan of Care Patient         Problem List Patient Active Problem List   Diagnosis Date Noted  . Encounter for therapeutic drug monitoring 04/20/2013  . Cardiomyopathy, secondary 03/20/2009  . Tubular adenoma of colon 03/20/2009  . CHEST PAIN -.Peri INCISIONAL 07/14/2008  . ICD-Medtronic 04/19/2008  . FATIGUE 08/31/2007  . Personal history of other diseases of digestive system 04/14/2007  . Hyperlipidemia 12/22/2006  . ANXIETY STATE NOS  11/27/2006  . KERATOCONJUNCTIVITIS SICCA 09/08/2006  . ATRIAL FIBRILLATION 07/14/2006  . Hypothyroidism, postsurgical 07/09/2006    Alda Lea, PT 11/17/2014, 10:57 AM  [facility] [address] [place], [place], [address] Phone: [phone]   Fax:  [phone]

## 2014-11-22 ENCOUNTER — Ambulatory Visit (INDEPENDENT_AMBULATORY_CARE_PROVIDER_SITE_OTHER): Payer: Medicare Other

## 2014-11-22 DIAGNOSIS — Z23 Encounter for immunization: Secondary | ICD-10-CM | POA: Diagnosis not present

## 2014-11-23 ENCOUNTER — Ambulatory Visit: Payer: Medicare Other | Admitting: Physical Therapy

## 2014-11-24 DIAGNOSIS — H25813 Combined forms of age-related cataract, bilateral: Secondary | ICD-10-CM | POA: Diagnosis not present

## 2014-11-24 DIAGNOSIS — H04123 Dry eye syndrome of bilateral lacrimal glands: Secondary | ICD-10-CM | POA: Diagnosis not present

## 2014-11-24 DIAGNOSIS — H40013 Open angle with borderline findings, low risk, bilateral: Secondary | ICD-10-CM | POA: Diagnosis not present

## 2014-11-24 DIAGNOSIS — G43809 Other migraine, not intractable, without status migrainosus: Secondary | ICD-10-CM | POA: Diagnosis not present

## 2014-11-27 ENCOUNTER — Other Ambulatory Visit: Payer: Self-pay | Admitting: Internal Medicine

## 2014-11-27 DIAGNOSIS — E785 Hyperlipidemia, unspecified: Secondary | ICD-10-CM

## 2014-11-30 ENCOUNTER — Other Ambulatory Visit: Payer: Self-pay | Admitting: Internal Medicine

## 2014-12-01 ENCOUNTER — Other Ambulatory Visit (INDEPENDENT_AMBULATORY_CARE_PROVIDER_SITE_OTHER): Payer: Medicare Other

## 2014-12-01 DIAGNOSIS — E890 Postprocedural hypothyroidism: Secondary | ICD-10-CM

## 2014-12-01 DIAGNOSIS — E785 Hyperlipidemia, unspecified: Secondary | ICD-10-CM | POA: Diagnosis not present

## 2014-12-01 LAB — HEPATIC FUNCTION PANEL
ALT: 16 U/L (ref 0–35)
AST: 20 U/L (ref 0–37)
Albumin: 3.8 g/dL (ref 3.5–5.2)
Alkaline Phosphatase: 60 U/L (ref 39–117)
BILIRUBIN TOTAL: 1.6 mg/dL — AB (ref 0.2–1.2)
Bilirubin, Direct: 0.3 mg/dL (ref 0.0–0.3)
TOTAL PROTEIN: 7 g/dL (ref 6.0–8.3)

## 2014-12-01 LAB — LIPID PANEL
CHOL/HDL RATIO: 3
Cholesterol: 128 mg/dL (ref 0–200)
HDL: 42.8 mg/dL (ref >39.00)
LDL CALC: 66 mg/dL (ref 0–99)
NonHDL: 85.07
Triglycerides: 93 mg/dL (ref 0.0–149.0)
VLDL: 18.6 mg/dL (ref 0.0–40.0)

## 2014-12-01 LAB — TSH: TSH: 0.21 u[IU]/mL — ABNORMAL LOW (ref 0.35–4.50)

## 2014-12-03 ENCOUNTER — Other Ambulatory Visit: Payer: Self-pay | Admitting: Internal Medicine

## 2014-12-03 DIAGNOSIS — E890 Postprocedural hypothyroidism: Secondary | ICD-10-CM

## 2014-12-05 ENCOUNTER — Ambulatory Visit (INDEPENDENT_AMBULATORY_CARE_PROVIDER_SITE_OTHER): Payer: Medicare Other | Admitting: *Deleted

## 2014-12-05 DIAGNOSIS — Z5181 Encounter for therapeutic drug level monitoring: Secondary | ICD-10-CM | POA: Diagnosis not present

## 2014-12-05 DIAGNOSIS — I4891 Unspecified atrial fibrillation: Secondary | ICD-10-CM

## 2014-12-05 DIAGNOSIS — I482 Chronic atrial fibrillation, unspecified: Secondary | ICD-10-CM

## 2014-12-05 LAB — POCT INR: INR: 1.5

## 2014-12-13 ENCOUNTER — Ambulatory Visit (INDEPENDENT_AMBULATORY_CARE_PROVIDER_SITE_OTHER): Payer: Medicare Other | Admitting: General Practice

## 2014-12-13 DIAGNOSIS — I4891 Unspecified atrial fibrillation: Secondary | ICD-10-CM | POA: Diagnosis not present

## 2014-12-13 DIAGNOSIS — Z5181 Encounter for therapeutic drug level monitoring: Secondary | ICD-10-CM | POA: Diagnosis not present

## 2014-12-13 LAB — POCT INR: INR: 4.9

## 2014-12-13 NOTE — Progress Notes (Signed)
Pre visit review using our clinic review tool, if applicable. No additional management support is needed unless otherwise documented below in the visit note. 

## 2014-12-14 NOTE — Progress Notes (Signed)
I have reviewed and agree with the plan. 

## 2014-12-15 ENCOUNTER — Ambulatory Visit (HOSPITAL_COMMUNITY)
Admission: RE | Admit: 2014-12-15 | Discharge: 2014-12-15 | Disposition: A | Discharge status: Home / Self Care | Payer: Medicare Other | Source: Ambulatory Visit | Attending: Cardiology | Admitting: Cardiology

## 2014-12-15 ENCOUNTER — Encounter (HOSPITAL_COMMUNITY): Payer: Self-pay

## 2014-12-15 VITALS — BP 122/68 | HR 80 | Wt 144.2 lb

## 2014-12-15 DIAGNOSIS — Z95810 Presence of automatic (implantable) cardiac defibrillator: Secondary | ICD-10-CM | POA: Diagnosis not present

## 2014-12-15 DIAGNOSIS — E785 Hyperlipidemia, unspecified: Secondary | ICD-10-CM | POA: Insufficient documentation

## 2014-12-15 DIAGNOSIS — E039 Hypothyroidism, unspecified: Secondary | ICD-10-CM | POA: Insufficient documentation

## 2014-12-15 DIAGNOSIS — Z7901 Long term (current) use of anticoagulants: Secondary | ICD-10-CM | POA: Diagnosis not present

## 2014-12-15 DIAGNOSIS — I482 Chronic atrial fibrillation, unspecified: Secondary | ICD-10-CM

## 2014-12-15 DIAGNOSIS — I429 Cardiomyopathy, unspecified: Secondary | ICD-10-CM | POA: Diagnosis not present

## 2014-12-15 DIAGNOSIS — Z79899 Other long term (current) drug therapy: Secondary | ICD-10-CM | POA: Diagnosis not present

## 2014-12-15 NOTE — Patient Instructions (Signed)
Your provider requests you have an echocardiogram in December.  FOLLOW UP: 1 yr with Dr.McLean

## 2014-12-17 ENCOUNTER — Other Ambulatory Visit: Payer: Self-pay | Admitting: Internal Medicine

## 2014-12-17 NOTE — Progress Notes (Signed)
Patient ID: Michele [name], female   DOB: 04/13/1929, 81 y.o.   MRN: [id number] PCP: Dr. Linna Darner  81 yo with chronic atrial fibrillation s/p AV nodal ablation and BiV pacing as well as prior tachycardia-mediated cardiomyopathy presents for cardiology followup.  She sees Dr Caryl Comes for EP.  She has been stable in recent years.  Back around 2010, she had difficult to control atrial fibrillation with fall in EF to 25-30%.  She ended up having AV nodal ablation with placement of a Medtronic CRT-D device.  Followup echo in 2010 showed EF 55-60%.    Since that time, she has done well.  She denies exertional dyspnea.  She can walk up a flight of steps without problems.  No chest pain.  Occasional vertigo-like symptoms that have been attributed to BPPV.  She uses Lasix only rarely.  No orthopnea/PND.  Taking warfarin with no BRBPR or melena.    Optivol: fluid index < threshold, stable thoracic impedance.   Labs (10/16): K 4.3, creatinine 1.16, LDL 66, HDL 43  PMH: 1. 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 2. Hypothyroidism 3. Hyperlipidemia 4. Atrial fibrillation: Chronic, s/p AV nodal ablation.  5. Cardiomyopathy: Suspected that this was tachycardia-mediated.  Now s/p AV nodal ablation with Medtronic CRT-D.  Echo in 2010 with EF 55-60% up from prior 25-30%.  6. H/o C difficile colitis 7. H/o cholecystectomy 8. H/o abdominal hysterectomy 9. BPPV  SH: Married ([place]), nonsmoker, occasional ETOH, originally from [place].   FH: No premature CAD.   ROS: All systems reviewed and negative except as per HPI.   Current Outpatient Prescriptions  Medication Sig Dispense Refill  . cholecalciferol (VITAMIN D) 1000 UNITS tablet Take 1,000 Units by mouth daily.    Marland Kitchen co-enzyme Q-10 30 MG capsule Take 30 mg by mouth daily.    Marland Kitchen COUMADIN 5 MG tablet TAKE AS DIRECTED. 90 tablet 0  . furosemide (LASIX) 20 MG tablet TAKE 1 TABLET ONCE DAILY AS NEEDED FOR FLUID. 30 tablet 2  . levothyroxine (SYNTHROID)  112 MCG tablet 1 qd EXCEPT 1/2 T,Th,& Sat 85 tablet 3  . Multiple Vitamin (MULTIVITAMIN WITH MINERALS) TABS Take 1 tablet by mouth daily.    Marland Kitchen saccharomyces boulardii (FLORASTOR) 250 MG capsule Take 250 mg by mouth daily.      Marland Kitchen VYTORIN 10-20 MG per tablet TAKE ONE TABLET AT BEDTIME. 30 tablet 5   No current facility-administered medications for this encounter.   BP 122/68 mmHg  Pulse 80  Wt 144 lb 4 oz (65.431 kg)  SpO2 98% General: NAD Neck: No JVD, no thyromegaly or thyroid nodule.  Lungs: Clear to auscultation bilaterally with normal respiratory effort. CV: Nondisplaced PMI.  Heart regular S1/S2, no S3/S4, no murmur.  No peripheral edema.  No carotid bruit.  Normal pedal pulses.  Abdomen: Soft, nontender, no hepatosplenomegaly, no distention.  Skin: Intact without lesions or rashes.  Neurologic: Alert and oriented x 3.  Psych: Normal affect. Extremities: No clubbing or cyanosis.  HEENT: Normal.   Assessment/Plan: 1. Atrial fibrillation: S/p AV nodal ablation.  She is on warfarin, no evidence for overt GI bleeding.   2. Cardiomyopathy: Suspected tachy-mediated cardiomyopathy with difficult-to-control atrial fibrillation. Now s/p AV nodal ablation with Medtronic CRT-D.  Last echo in 2010 showed EF improved to 55-60% from 25-30%.  She has Lasix for prn use but rarely uses.  - I will get an echo to make sure that LV systolic function has remained normal.  3. Hyperlipidemia: LDL well-controlled on Vytorin.  Loralie Champagne 12/17/2014

## 2014-12-18 ENCOUNTER — Ambulatory Visit (INDEPENDENT_AMBULATORY_CARE_PROVIDER_SITE_OTHER): Payer: Medicare Other | Admitting: *Deleted

## 2014-12-18 ENCOUNTER — Other Ambulatory Visit: Payer: Self-pay | Admitting: Emergency Medicine

## 2014-12-18 DIAGNOSIS — I429 Cardiomyopathy, unspecified: Secondary | ICD-10-CM

## 2014-12-18 MED ORDER — EZETIMIBE-SIMVASTATIN 10-20 MG PO TABS: ORAL_TABLET | ORAL | Status: DC

## 2014-12-19 NOTE — Progress Notes (Signed)
Remote ICD transmission.   

## 2014-12-20 ENCOUNTER — Ambulatory Visit (INDEPENDENT_AMBULATORY_CARE_PROVIDER_SITE_OTHER): Payer: Medicare Other | Admitting: General Practice

## 2014-12-20 DIAGNOSIS — I4891 Unspecified atrial fibrillation: Secondary | ICD-10-CM

## 2014-12-20 DIAGNOSIS — Z5181 Encounter for therapeutic drug level monitoring: Secondary | ICD-10-CM

## 2014-12-20 LAB — POCT INR: INR: 1.6

## 2014-12-20 NOTE — Progress Notes (Signed)
Pre visit review using our clinic review tool, if applicable. No additional management support is needed unless otherwise documented below in the visit note. 

## 2014-12-20 NOTE — Progress Notes (Signed)
I have reviewed and agree with the plan. 

## 2014-12-22 ENCOUNTER — Encounter: Payer: Self-pay | Admitting: Cardiology

## 2014-12-22 LAB — CUP PACEART REMOTE DEVICE CHECK
Brady Statistic AP VP Percent: 0 %
Brady Statistic AP VS Percent: 0 %
Brady Statistic AS VP Percent: 99.9 %
Brady Statistic RA Percent Paced: 0 %
Date Time Interrogation Session: 20161031073629
HIGH POWER IMPEDANCE MEASURED VALUE: 45 Ohm
HighPow Impedance: 323 Ohm
HighPow Impedance: 64 Ohm
Implantable Lead Implant Date: 20080908
Implantable Lead Implant Date: 20100121
Implantable Lead Location: 753858
Implantable Lead Location: 753859
Implantable Lead Location: 753860
Implantable Lead Model: 5076
Lead Channel Impedance Value: 399 Ohm
Lead Channel Impedance Value: 456 Ohm
Lead Channel Impedance Value: 494 Ohm
Lead Channel Impedance Value: 589 Ohm
Lead Channel Impedance Value: 893 Ohm
Lead Channel Pacing Threshold Amplitude: 1.125 V
Lead Channel Pacing Threshold Pulse Width: 0.8 ms
Lead Channel Sensing Intrinsic Amplitude: 1.125 mV
Lead Channel Sensing Intrinsic Amplitude: 1.125 mV
Lead Channel Setting Pacing Amplitude: 2.5 V
MDC IDC LEAD IMPLANT DT: 20100121
MDC IDC LEAD IMPLANT DT: 20130704
MDC IDC LEAD LOCATION: 753860
MDC IDC LEAD MODEL: 4196
MDC IDC LEAD MODEL: 7120
MDC IDC MSMT BATTERY VOLTAGE: 2.84 V
MDC IDC MSMT LEADCHNL RV PACING THRESHOLD AMPLITUDE: 0.625 V
MDC IDC MSMT LEADCHNL RV PACING THRESHOLD PULSEWIDTH: 0.4 ms
MDC IDC SET LEADCHNL LV PACING AMPLITUDE: 2.25 V
MDC IDC SET LEADCHNL LV PACING PULSEWIDTH: 0.8 ms
MDC IDC SET LEADCHNL RV PACING PULSEWIDTH: 0.4 ms
MDC IDC SET LEADCHNL RV SENSING SENSITIVITY: 0.3 mV
MDC IDC STAT BRADY AS VS PERCENT: 0.1 %
MDC IDC STAT BRADY RV PERCENT PACED: 99.9 %

## 2015-01-17 ENCOUNTER — Encounter: Payer: Self-pay | Admitting: Cardiology

## 2015-01-17 ENCOUNTER — Ambulatory Visit: Payer: Medicare Other

## 2015-01-25 ENCOUNTER — Other Ambulatory Visit (HOSPITAL_COMMUNITY): Payer: Medicare Other

## 2015-01-31 ENCOUNTER — Ambulatory Visit: Payer: Medicare Other | Admitting: Internal Medicine

## 2015-01-31 ENCOUNTER — Ambulatory Visit (INDEPENDENT_AMBULATORY_CARE_PROVIDER_SITE_OTHER): Payer: Medicare Other | Admitting: General Practice

## 2015-01-31 DIAGNOSIS — I4891 Unspecified atrial fibrillation: Secondary | ICD-10-CM | POA: Diagnosis not present

## 2015-01-31 LAB — POCT INR: INR: 2.3

## 2015-01-31 NOTE — Progress Notes (Signed)
I have reviewed and agree with the plan. 

## 2015-01-31 NOTE — Progress Notes (Signed)
Pre visit review using our clinic review tool, if applicable. No additional management support is needed unless otherwise documented below in the visit note. 

## 2015-02-06 ENCOUNTER — Ambulatory Visit (HOSPITAL_COMMUNITY): Payer: Medicare Other | Attending: Internal Medicine

## 2015-02-06 ENCOUNTER — Other Ambulatory Visit: Payer: Self-pay

## 2015-02-06 DIAGNOSIS — I340 Nonrheumatic mitral (valve) insufficiency: Secondary | ICD-10-CM | POA: Diagnosis not present

## 2015-02-06 DIAGNOSIS — I358 Other nonrheumatic aortic valve disorders: Secondary | ICD-10-CM | POA: Diagnosis not present

## 2015-02-06 DIAGNOSIS — I429 Cardiomyopathy, unspecified: Secondary | ICD-10-CM | POA: Diagnosis not present

## 2015-02-06 DIAGNOSIS — I071 Rheumatic tricuspid insufficiency: Secondary | ICD-10-CM | POA: Diagnosis not present

## 2015-02-06 DIAGNOSIS — Z95810 Presence of automatic (implantable) cardiac defibrillator: Secondary | ICD-10-CM | POA: Diagnosis not present

## 2015-02-06 DIAGNOSIS — Z79899 Other long term (current) drug therapy: Secondary | ICD-10-CM

## 2015-02-06 DIAGNOSIS — I517 Cardiomegaly: Secondary | ICD-10-CM | POA: Insufficient documentation

## 2015-02-06 DIAGNOSIS — I351 Nonrheumatic aortic (valve) insufficiency: Secondary | ICD-10-CM | POA: Diagnosis not present

## 2015-02-07 ENCOUNTER — Ambulatory Visit: Payer: Medicare Other

## 2015-02-09 ENCOUNTER — Encounter: Payer: Self-pay | Admitting: Internal Medicine

## 2015-02-28 ENCOUNTER — Ambulatory Visit (INDEPENDENT_AMBULATORY_CARE_PROVIDER_SITE_OTHER): Payer: Medicare Other | Admitting: General Practice

## 2015-02-28 DIAGNOSIS — Z5181 Encounter for therapeutic drug level monitoring: Secondary | ICD-10-CM

## 2015-02-28 DIAGNOSIS — I4891 Unspecified atrial fibrillation: Secondary | ICD-10-CM

## 2015-02-28 LAB — POCT INR: INR: 2.7

## 2015-02-28 NOTE — Progress Notes (Signed)
I have reviewed and agree with the plan. 

## 2015-02-28 NOTE — Progress Notes (Signed)
Pre visit review using our clinic review tool, if applicable. No additional management support is needed unless otherwise documented below in the visit note. 

## 2015-03-08 ENCOUNTER — Other Ambulatory Visit: Payer: Self-pay | Admitting: Internal Medicine

## 2015-03-08 NOTE — Telephone Encounter (Signed)
Patients husband called in to advise that this was sent to dr Aquilla Hacker office in error. Was intended for [place] since INR is managed here

## 2015-03-19 ENCOUNTER — Ambulatory Visit (INDEPENDENT_AMBULATORY_CARE_PROVIDER_SITE_OTHER): Payer: Medicare Other | Admitting: *Deleted

## 2015-03-19 DIAGNOSIS — I429 Cardiomyopathy, unspecified: Secondary | ICD-10-CM | POA: Diagnosis not present

## 2015-03-19 NOTE — Progress Notes (Signed)
Remote ICD transmission.   

## 2015-03-23 LAB — CUP PACEART REMOTE DEVICE CHECK
Battery Voltage: 2.71 V
Brady Statistic AP VP Percent: 0 %
Brady Statistic AS VS Percent: 0.11 %
HIGH POWER IMPEDANCE MEASURED VALUE: 342 Ohm
HIGH POWER IMPEDANCE MEASURED VALUE: 50 Ohm
HIGH POWER IMPEDANCE MEASURED VALUE: 74 Ohm
Implantable Lead Implant Date: 20080908
Implantable Lead Implant Date: 20100121
Implantable Lead Implant Date: 20100121
Implantable Lead Implant Date: 20130704
Implantable Lead Model: 5076
Implantable Lead Model: 7120
Lead Channel Impedance Value: 627 Ohm
Lead Channel Pacing Threshold Amplitude: 0.625 V
Lead Channel Pacing Threshold Amplitude: 1.125 V
Lead Channel Pacing Threshold Pulse Width: 0.4 ms
Lead Channel Sensing Intrinsic Amplitude: 1.625 mV
Lead Channel Sensing Intrinsic Amplitude: 1.625 mV
Lead Channel Setting Pacing Pulse Width: 0.4 ms
Lead Channel Setting Sensing Sensitivity: 0.3 mV
MDC IDC LEAD LOCATION: 753858
MDC IDC LEAD LOCATION: 753859
MDC IDC LEAD LOCATION: 753860
MDC IDC LEAD LOCATION: 753860
MDC IDC LEAD MODEL: 4196
MDC IDC MSMT LEADCHNL LV IMPEDANCE VALUE: 1026 Ohm
MDC IDC MSMT LEADCHNL LV IMPEDANCE VALUE: 570 Ohm
MDC IDC MSMT LEADCHNL LV PACING THRESHOLD PULSEWIDTH: 0.8 ms
MDC IDC MSMT LEADCHNL RA IMPEDANCE VALUE: 456 Ohm
MDC IDC MSMT LEADCHNL RV IMPEDANCE VALUE: 494 Ohm
MDC IDC SESS DTM: 20170130083528
MDC IDC SET LEADCHNL LV PACING AMPLITUDE: 2.25 V
MDC IDC SET LEADCHNL LV PACING PULSEWIDTH: 0.8 ms
MDC IDC SET LEADCHNL RV PACING AMPLITUDE: 2.5 V
MDC IDC STAT BRADY AP VS PERCENT: 0 %
MDC IDC STAT BRADY AS VP PERCENT: 99.89 %
MDC IDC STAT BRADY RA PERCENT PACED: 0 %
MDC IDC STAT BRADY RV PERCENT PACED: 99.89 %

## 2015-03-28 ENCOUNTER — Ambulatory Visit (INDEPENDENT_AMBULATORY_CARE_PROVIDER_SITE_OTHER): Payer: Medicare Other | Admitting: General Practice

## 2015-03-28 DIAGNOSIS — Z5181 Encounter for therapeutic drug level monitoring: Secondary | ICD-10-CM

## 2015-03-28 DIAGNOSIS — I4891 Unspecified atrial fibrillation: Secondary | ICD-10-CM

## 2015-03-28 LAB — POCT INR: INR: 2.5

## 2015-03-28 NOTE — Progress Notes (Signed)
I have reviewed and agree with the plan. 

## 2015-03-28 NOTE — Progress Notes (Signed)
Pre visit review using our clinic review tool, if applicable. No additional management support is needed unless otherwise documented below in the visit note. 

## 2015-03-30 ENCOUNTER — Encounter: Payer: Self-pay | Admitting: Cardiology

## 2015-04-13 ENCOUNTER — Encounter: Payer: Self-pay | Admitting: Cardiology

## 2015-04-27 ENCOUNTER — Ambulatory Visit (INDEPENDENT_AMBULATORY_CARE_PROVIDER_SITE_OTHER): Payer: Medicare Other | Admitting: General Practice

## 2015-04-27 DIAGNOSIS — Z5181 Encounter for therapeutic drug level monitoring: Secondary | ICD-10-CM | POA: Diagnosis not present

## 2015-04-27 DIAGNOSIS — I4891 Unspecified atrial fibrillation: Secondary | ICD-10-CM | POA: Diagnosis not present

## 2015-04-27 LAB — POCT INR: INR: 2.4

## 2015-04-27 NOTE — Progress Notes (Signed)
I have reviewed and agree with the plan. 

## 2015-04-27 NOTE — Progress Notes (Signed)
Pre visit review using our clinic review tool, if applicable. No additional management support is needed unless otherwise documented below in the visit note. 

## 2015-05-09 ENCOUNTER — Ambulatory Visit: Payer: Medicare Other | Admitting: Internal Medicine

## 2015-05-18 ENCOUNTER — Ambulatory Visit (INDEPENDENT_AMBULATORY_CARE_PROVIDER_SITE_OTHER): Payer: Medicare Other | Admitting: General Practice

## 2015-05-18 DIAGNOSIS — I4891 Unspecified atrial fibrillation: Secondary | ICD-10-CM | POA: Diagnosis not present

## 2015-05-18 DIAGNOSIS — Z5181 Encounter for therapeutic drug level monitoring: Secondary | ICD-10-CM

## 2015-05-18 LAB — POCT INR: INR: 2.3

## 2015-05-18 NOTE — Progress Notes (Signed)
Pre visit review using our clinic review tool, if applicable. No additional management support is needed unless otherwise documented below in the visit note. 

## 2015-05-18 NOTE — Progress Notes (Signed)
I have reviewed and agree with the plan. 

## 2015-05-25 ENCOUNTER — Ambulatory Visit: Payer: Medicare Other | Admitting: Endocrinology

## 2015-05-25 DIAGNOSIS — Z0289 Encounter for other administrative examinations: Secondary | ICD-10-CM

## 2015-06-14 ENCOUNTER — Other Ambulatory Visit: Payer: Self-pay | Admitting: General Practice

## 2015-06-14 ENCOUNTER — Other Ambulatory Visit: Payer: Self-pay | Admitting: Internal Medicine

## 2015-06-14 MED ORDER — WARFARIN SODIUM 5 MG PO TABS: ORAL_TABLET | ORAL | Status: DC

## 2015-06-15 ENCOUNTER — Ambulatory Visit (INDEPENDENT_AMBULATORY_CARE_PROVIDER_SITE_OTHER): Payer: Medicare Other | Admitting: General Practice

## 2015-06-15 ENCOUNTER — Telehealth: Payer: Self-pay | Admitting: General Practice

## 2015-06-15 DIAGNOSIS — Z5181 Encounter for therapeutic drug level monitoring: Secondary | ICD-10-CM

## 2015-06-15 DIAGNOSIS — I4891 Unspecified atrial fibrillation: Secondary | ICD-10-CM

## 2015-06-15 LAB — POCT INR: INR: 2.8

## 2015-06-15 NOTE — Progress Notes (Signed)
I have reviewed and agree with the plan. 

## 2015-06-15 NOTE — Progress Notes (Signed)
Pre visit review using our clinic review tool, if applicable. No additional management support is needed unless otherwise documented below in the visit note. 

## 2015-06-15 NOTE — Telephone Encounter (Signed)
-----   Message from Anda Latina, LPN sent at [phone]  8:43 AM EDT ----- [name], This pt will only being seeing Dr. Loanne Drilling for her thyroid.  Megan  ----- Message -----    From: Warden Fillers, RN    Sent: 06/15/2015   8:34 AM      To: Anda Latina, LPN  Hi Megan,  Is Dr. Loanne Drilling taking this patient on as her PCP or is she just seeing him for her thyroid?  Jenny Reichmann

## 2015-06-18 ENCOUNTER — Ambulatory Visit (INDEPENDENT_AMBULATORY_CARE_PROVIDER_SITE_OTHER): Payer: Medicare Other | Admitting: *Deleted

## 2015-06-18 DIAGNOSIS — I429 Cardiomyopathy, unspecified: Secondary | ICD-10-CM | POA: Diagnosis not present

## 2015-06-18 NOTE — Progress Notes (Signed)
Remote ICD transmission.   

## 2015-06-29 ENCOUNTER — Ambulatory Visit (INDEPENDENT_AMBULATORY_CARE_PROVIDER_SITE_OTHER): Payer: Medicare Other | Admitting: Internal Medicine

## 2015-06-29 DIAGNOSIS — Z95810 Presence of automatic (implantable) cardiac defibrillator: Secondary | ICD-10-CM

## 2015-06-29 DIAGNOSIS — Z0289 Encounter for other administrative examinations: Secondary | ICD-10-CM

## 2015-06-29 NOTE — Progress Notes (Signed)
   Subjective:    Patient ID: Michele [name], female    DOB: 11-04-1929, 82 y.o.   MRN: [id number]  HPI  Pt not seen, opened in error  Review of Systems     Objective:   Physical Exam        Assessment & Plan:

## 2015-07-02 NOTE — Patient Instructions (Signed)
Pt not seen/opened in error

## 2015-07-02 NOTE — Assessment & Plan Note (Signed)
Opened in error, pt not seen 

## 2015-07-06 ENCOUNTER — Ambulatory Visit (INDEPENDENT_AMBULATORY_CARE_PROVIDER_SITE_OTHER): Payer: Medicare Other

## 2015-07-06 ENCOUNTER — Ambulatory Visit: Payer: Medicare Other | Admitting: Endocrinology

## 2015-07-06 ENCOUNTER — Ambulatory Visit (HOSPITAL_COMMUNITY)
Admission: EM | Admit: 2015-07-06 | Discharge: 2015-07-06 | Disposition: A | Discharge status: Home / Self Care | Payer: Medicare Other | Attending: Emergency Medicine | Admitting: Emergency Medicine

## 2015-07-06 ENCOUNTER — Encounter (HOSPITAL_COMMUNITY): Payer: Self-pay | Admitting: Emergency Medicine

## 2015-07-06 DIAGNOSIS — S46811A Strain of other muscles, fascia and tendons at shoulder and upper arm level, right arm, initial encounter: Secondary | ICD-10-CM

## 2015-07-06 NOTE — ED Provider Notes (Signed)
CSN: [id number]     Arrival date & time 07/06/15  1837 History   First MD Initiated Contact with Patient 07/06/15 2022     Chief Complaint  Patient presents with  . Shoulder Pain   (Consider location/radiation/quality/duration/timing/severity/associated sxs/prior Treatment) HPI History obtained from patient: Location: Right scapula   Context/Duration: 2 days ago attempting to assist in lifting her husband felt a pulling sensation in her right scapula.  Severity: 2-3  Quality: Aching Timing:       Constant     Home Treatment: Cold compresses, Tylenol Associated symptoms:  Pain radiates at times into her neck Family History: Mother had a heart attack    Past Medical History  Diagnosis Date  . Cardiomyopathy     RATE RELATED  . Diverticulosis     DIVERTICULITIS  . C. difficile colitis   . Atrial fibrillation ([place])     NEG STRESS CARDIOLITE  . Gilbert's syndrome   . Hyperlipidemia     NMR LIOPROFILE LDL 234(3206/2234)HDL 37,TG86  . Thyroid disease     HYPOTHYROIDISM...THYROID NODULES  . Diverticulitis     PMH of X 2  . Hx of colonic polyp 2011    Dr Fuller Plan  . CHF (congestive heart failure) [facility])    Past Surgical History  Procedure Laterality Date  . Biopsy thyroid      SINGLE NODULE  . Throidectomy 12/08      benign  . Appendectomy  1956  . Cholecystectomy  1972  . Abdominal hysterectomy  1973  . Pacemaker placement      10/2006  . Icd....02/2008    . Breast biopsy  1997  . Rotator cuff repair      x2  . Colonoscopy with polypectomy  2011  . Cardiac defibrillator placement    . Lead revision N/A 08/21/2011    Procedure: LEAD REVISION;  Surgeon: Deboraha Sprang, MD;  Location: [facility] CATH LAB;  Service: Cardiovascular;  Laterality: N/A;   Family History  Problem Relation Age of Onset  . Heart attack Mother 26  . Heart attack Brother 1  . Lung disease Father     Silicosis  . Vasculitis Sister     Giant Cell Arteritis  . Ulcerative colitis Son    Social History   Substance Use Topics  . Smoking status: Never Smoker   . Smokeless tobacco: None  . Alcohol Use: No   OB History    No data available     Review of Systems  Denies: HEADACHE, NAUSEA, ABDOMINAL PAIN, CHEST PAIN, CONGESTION, DYSURIA, SHORTNESS OF BREATH  Allergies  Cephalexin; Erythromycin; Terbinafine hcl; Colchicine; and Rosuvastatin  Home Medications   Prior to Admission medications   Medication Sig Start Date End Date Taking? Authorizing Provider  cholecalciferol (VITAMIN D) 1000 UNITS tablet Take 1,000 Units by mouth daily.    Historical Provider, MD  co-enzyme Q-10 30 MG capsule Take 30 mg by mouth daily.    Historical Provider, MD  ezetimibe-simvastatin (VYTORIN) 10-20 MG tablet TAKE ONE TABLET AT BEDTIME. 12/18/14   Hendricks Limes, MD  furosemide (LASIX) 20 MG tablet TAKE 1 TABLET ONCE DAILY AS NEEDED FOR FLUID. 07/31/14   Deboraha Sprang, MD  levothyroxine (SYNTHROID) 112 MCG tablet 1 qd EXCEPT 1/2 T,Th,& Sat 12/03/14   Hendricks Limes, MD  Multiple Vitamin (MULTIVITAMIN WITH MINERALS) TABS Take 1 tablet by mouth daily.    Historical Provider, MD  saccharomyces boulardii (FLORASTOR) 250 MG capsule Take 250 mg by mouth daily.  Historical Provider, MD  warfarin (COUMADIN) 5 MG tablet TAKE AS DIRECTED. 06/14/15   Binnie Rail, MD   Meds Ordered and Administered this Visit  Medications - No data to display  There were no vitals taken for this visit. No data found.   Physical Exam NURSES NOTES AND VITAL SIGNS REVIEWED. CONSTITUTIONAL: Well developed, well nourished, no acute distress HEENT: normocephalic, atraumatic EYES: Conjunctiva normal NECK:normal ROM, supple, no adenopathy PULMONARY:No respiratory distress, normal effort ABDOMINAL: Soft, ND, NT BS+, No CVAT MUSCULOSKELETAL: Normal ROM of all extremities, Slight tenderness along the medial aspect of the right scapula. No shoulder tenderness is noted appreciated.  SKIN: warm and dry without rash PSYCHIATRIC:  Mood and affect, behavior are normal  ED Course  Procedures (including critical care time)  Labs Review Labs Reviewed - No data to display  Imaging Review Dg Shoulder Right  07/06/2015  CLINICAL DATA:  Right Shoulder pain after lifting injury EXAM: RIGHT SHOULDER - 2+ VIEW COMPARISON:  None. FINDINGS: No fracture, dislocation, Hill-Sachs deformity or suspicious focal osseous lesion. Mild osteoarthritis in the right acromioclavicular joint, with no evidence of right acromioclavicular joint separation. Tiny marginal osteophytes at the right glenohumeral joint. Mild hypertrophic change at the right greater tuberosity. IMPRESSION: 1. No fracture or malalignment in the right shoulder. 2. Mild right acromioclavicular joint and mild right glenohumeral joint osteoarthritis. 3. Mild hypertrophic change at the right greater tuberosity, which could indicate insertional right rotator cuff tendinopathy. Electronically Signed   By: Ilona Sorrel M.D.   On: 07/06/2015 21:05    I HAVE PERSONALLY  REVIEWED AND DISCUSSED RESULTS OF  X-RAYS WITH PATIENT PRIOR TO DISCHARGE.    Visual Acuity Review  Right Eye Distance:   Left Eye Distance:   Bilateral Distance:    Right Eye Near:   Left Eye Near:    Bilateral Near:       Sling for comfort   MDM   1. Strain of levator scapulae muscle, right, initial encounter     Patient is reassured that there are no issues that require transfer to higher level of care at this time or additional tests. Patient is advised to continue home symptomatic treatment. Patient is advised that if there are new or worsening symptoms to attend the emergency department, contact primary care provider, or return to UC. Instructions of care provided discharged home in stable condition.    THIS NOTE WAS GENERATED USING A VOICE RECOGNITION SOFTWARE PROGRAM. ALL REASONABLE EFFORTS  WERE MADE TO PROOFREAD THIS DOCUMENT FOR ACCURACY.  I have verbally reviewed the discharge  instructions with the patient. A printed AVS was given to the patient.  All questions were answered prior to discharge.      [name], [place] 07/07/15 1547

## 2015-07-06 NOTE — Discharge Instructions (Signed)
Muscle Strain A muscle strain (pulled muscle) happens when a muscle is stretched beyond normal length. It happens when a sudden, violent force stretches your muscle too far. Usually, a few of the fibers in your muscle are torn. Muscle strain is common in athletes. Recovery usually takes 1-2 weeks. Complete healing takes 5-6 weeks.  HOME CARE   Follow the PRICE method of treatment to help your injury get better. Do this the first 2-3 days after the injury:  Protect. Protect the muscle to keep it from getting injured again.  Rest. Limit your activity and rest the injured body part.  Ice. Put ice in a plastic bag. Place a towel between your skin and the bag. Then, apply the ice and leave it on from 15-20 minutes each hour. After the third day, switch to moist heat packs.  Compression. Use a splint or elastic bandage on the injured area for comfort. Do not put it on too tightly.  Elevate. Keep the injured body part above the level of your heart.  Only take medicine as told by your doctor.  Warm up before doing exercise to prevent future muscle strains. GET HELP IF:   You have more pain or puffiness (swelling) in the injured area.  You feel numbness, tingling, or notice a loss of strength in the injured area. MAKE SURE YOU:   Understand these instructions.  Will watch your condition.  Will get help right away if you are not doing well or get worse.   This information is not intended to replace advice given to you by your health care provider. Make sure you discuss any questions you have with your health care provider.   Document Released: 11/13/2007 Document Revised: 11/24/2012 Document Reviewed: 09/02/2012 Elsevier Interactive Patient Education 2016 [place] therapy can help ease sore, stiff, injured, and tight muscles and joints. Heat relaxes your muscles, which may help ease your pain. Heat therapy should only be used on old, pre-existing, or long-lasting  (chronic) injuries. Do not use heat therapy unless told by your doctor. HOW TO USE HEAT THERAPY There are several different kinds of heat therapy, including:  Moist heat pack.  Warm water bath.  Hot water bottle.  Electric heating pad.  Heated gel pack.  Heated wrap.  Electric heating pad. GENERAL HEAT THERAPY RECOMMENDATIONS   Do not sleep while using heat therapy. Only use heat therapy while you are awake.  Your skin may turn pink while using heat therapy. Do not use heat therapy if your skin turns red.  Do not use heat therapy if you have new pain.  High heat or long exposure to heat can cause burns. Be careful when using heat therapy to avoid burning your skin.  Do not use heat therapy on areas of your skin that are already irritated, such as with a rash or sunburn. GET HELP IF:   You have blisters, redness, swelling (puffiness), or numbness.  You have new pain.  Your pain is worse. MAKE SURE YOU:  Understand these instructions.  Will watch your condition.  Will get help right away if you are not doing well or get worse.   This information is not intended to replace advice given to you by your health care provider. Make sure you discuss any questions you have with your health care provider.   Document Released: 04/28/2011 Document Revised: 02/24/2014 Document Reviewed: 03/29/2013 Elsevier Interactive Patient Education 2016 [employer]. How to Use a Sling A sling is a type  of hanging bandage. You wear it around your neck to protect an injured arm, shoulder, or other body part. You may need to wear a sling to keep you from moving the injured body part while it heals. Keeping the injured part of your body still reduces pain and speeds up healing. Your doctor may suggest you use a sling if you have:  A broken arm.  A broken collarbone.  A shoulder injury.  Surgery. RISKS AND COMPLICATIONS Wearing a sling the wrong way can:  Make your injury  worse.  Cause stiffness or numbness.  Affect blood circulation in your arm and hand. This can cause tingling or numbness in your fingers or hands. HOW TO USE A SLING The way that you should use a sling depends on your injury. It is important that you follow all of your doctor's instructions for your injury. Also follow these general suggestions:  Wear the sling so that your arm bends 90 degrees at the elbow. That is like a right angle or the shape of a capital letter "L." The sling should also support your wrist and hand.  Try not to move your arm.  Do not lie down flat on your back while you have to wear a sling. Sleep in a recliner or use pillows to raise your upper body in bed.  Do not twist, raise, or move your arm in a way that could make your injury worse.  Do not lean on your arm while you have to wear a sling.  Do not lift anything while you have to wear a sling. GET HELP IF:  You have bruising, swelling, or pain that is getting worse.  Your pain medicine is not helping.  You have a fever. GET HELP RIGHT AWAY IF:  Your fingers are numb or tingling.  Your fingers turn blue or feel cold to the touch.  You cannot control the bleeding from your injury.  You are short of breath.   This information is not intended to replace advice given to you by your health care provider. Make sure you discuss any questions you have with your health care provider.   Document Released: 04/30/2009 Document Revised: 02/24/2014 Document Reviewed: 12/07/2013 Elsevier Interactive Patient Education [employer].

## 2015-07-06 NOTE — ED Notes (Signed)
Patient reports right shoulder pain for a week.  Pain seemed worse this morning.  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  Reports her husband falls frequently.  Reports he has had 6-8 falls this week alone.  Patient reports she places her right shoulder under him to assist him off the floor.

## 2015-07-12 ENCOUNTER — Other Ambulatory Visit: Payer: Self-pay | Admitting: Internal Medicine

## 2015-07-13 ENCOUNTER — Other Ambulatory Visit (INDEPENDENT_AMBULATORY_CARE_PROVIDER_SITE_OTHER): Payer: Medicare Other

## 2015-07-13 ENCOUNTER — Encounter: Payer: Self-pay | Admitting: Internal Medicine

## 2015-07-13 ENCOUNTER — Ambulatory Visit (INDEPENDENT_AMBULATORY_CARE_PROVIDER_SITE_OTHER): Payer: Medicare Other | Admitting: Internal Medicine

## 2015-07-13 VITALS — BP 138/80 | HR 94 | Temp 98.1°F | Resp 20 | Wt 142.0 lb

## 2015-07-13 DIAGNOSIS — F4321 Adjustment disorder with depressed mood: Secondary | ICD-10-CM

## 2015-07-13 DIAGNOSIS — Z0000 Encounter for general adult medical examination without abnormal findings: Secondary | ICD-10-CM | POA: Insufficient documentation

## 2015-07-13 DIAGNOSIS — Z0001 Encounter for general adult medical examination with abnormal findings: Secondary | ICD-10-CM | POA: Diagnosis not present

## 2015-07-13 DIAGNOSIS — E785 Hyperlipidemia, unspecified: Secondary | ICD-10-CM

## 2015-07-13 DIAGNOSIS — R6889 Other general symptoms and signs: Secondary | ICD-10-CM | POA: Diagnosis not present

## 2015-07-13 DIAGNOSIS — E890 Postprocedural hypothyroidism: Secondary | ICD-10-CM

## 2015-07-13 DIAGNOSIS — I429 Cardiomyopathy, unspecified: Secondary | ICD-10-CM

## 2015-07-13 LAB — VITAMIN B12: Vitamin B-12: 509 pg/mL (ref 211–911)

## 2015-07-13 MED ORDER — EZETIMIBE-SIMVASTATIN 10-20 MG PO TABS: ORAL_TABLET | ORAL | Status: DC

## 2015-07-13 NOTE — Patient Instructions (Signed)

## 2015-07-13 NOTE — Assessment & Plan Note (Signed)
Suspect possibly not significant LT memory loss, may be related to grief, seems somewhat "scattered" today but without other significant confusion, declines counseling referral, to check B12,  to f/u any worsening symptoms or concerns

## 2015-07-13 NOTE — Progress Notes (Signed)
Subjective:    Patient ID: Michele [name], female    DOB: [birth year], 82 y.o.   MRN: [id number]  HPI  Here for wellness and f/u;  Overall doing ok;  Pt denies Chest pain, worsening SOB, DOE, wheezing, orthopnea, PND, worsening LE edema, palpitations, dizziness or syncope.  Pt denies neurological change such as new headache, facial or extremity weakness.  Pt denies polydipsia, polyuria, or low sugar symptoms. Pt states overall good compliance with treatment and medications, good tolerability, and has been trying to follow appropriate diet.  Pt denies worsening depressive symptoms, suicidal ideation or panic. No fever, night sweats, wt loss, loss of appetite, or other constitutional symptoms.  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  Husband just died a few days ago, has been grieving, funeral tomorrow. Has 3 daughters and 2 sons in town now, and 1 or more have been "taking over" and causing pt some consternation.  Gets DXA with Dr Cleon Dew 2 yrs ago, OK per pt. Pt did not allow daughter to be present in exam room, daughter is concerned about her memory, pt is able to manage her meds, knows names and purpose, and does not seem to have signficant difficulty with ST memory Denies hyper or hypo thyroid symptoms such as voice, skin or hair change. Past Medical History  Diagnosis Date  . Cardiomyopathy     RATE RELATED  . Diverticulosis     DIVERTICULITIS  . C. difficile colitis   . Atrial fibrillation ([place])     NEG STRESS CARDIOLITE  . Gilbert's syndrome   . Hyperlipidemia     NMR LIOPROFILE LDL 234(3206/2234)HDL 37,TG86  . Thyroid disease     HYPOTHYROIDISM...THYROID NODULES  . Diverticulitis     PMH of X 2  . Hx of colonic polyp 2011    Dr Fuller Plan  . CHF (congestive heart  failure) [facility])    Past Surgical History  Procedure Laterality Date  . Biopsy thyroid      SINGLE NODULE  . Throidectomy 12/08      benign  . Appendectomy  1956  . Cholecystectomy  1972  . Abdominal hysterectomy  1973  . Pacemaker placement      10/2006  . Icd....02/2008    . Breast biopsy  1997  . Rotator cuff repair      x2  . Colonoscopy with polypectomy  2011  . Cardiac defibrillator placement    . Lead revision N/A 08/21/2011    Procedure: LEAD REVISION;  Surgeon: Deboraha Sprang, MD;  Location: [facility] CATH LAB;  Service: Cardiovascular;  Laterality: N/A;    reports that she has never smoked. She does not have any smokeless tobacco history on file. She reports that she does not drink alcohol or use illicit drugs. family history includes Heart attack (age of onset: 49) in her brother; Heart attack (age of onset: 83) in her mother; Lung disease in her father; Ulcerative colitis in her son; Vasculitis in her sister. Allergies  Allergen Reactions  . Cephalexin Hives  . Erythromycin Swelling and Other (See Comments)    REACTION: swollen and sore mouth  . Terbinafine Hcl     Rash   . Colchicine Other (See Comments)    REACTION: violent diarrhea  . Rosuvastatin Other (See Comments)  REACTION: upper arm pain bilaterally   Current Outpatient Prescriptions on File Prior to Visit  Medication Sig Dispense Refill  . cholecalciferol (VITAMIN D) 1000 UNITS tablet Take 1,000 Units by mouth daily.    Marland Kitchen co-enzyme Q-10 30 MG capsule Take 30 mg by mouth daily.    . furosemide (LASIX) 20 MG tablet TAKE 1 TABLET ONCE DAILY AS NEEDED FOR FLUID. 30 tablet 2  . levothyroxine (SYNTHROID) 112 MCG tablet 1 qd EXCEPT 1/2 T,Th,& Sat 85 tablet 3  . Multiple Vitamin (MULTIVITAMIN WITH MINERALS) TABS Take 1 tablet by mouth daily.    Marland Kitchen saccharomyces boulardii (FLORASTOR) 250 MG capsule Take 250 mg by mouth daily.      Marland Kitchen warfarin (COUMADIN) 5 MG tablet TAKE AS DIRECTED. 30 tablet 0   No current  facility-administered medications on file prior to visit.    Review of Systems Constitutional: Negative for increased diaphoresis, or other activity, appetite or siginficant weight change other than noted HENT: Negative for worsening hearing loss, ear pain, facial swelling, mouth sores and neck stiffness.   Eyes: Negative for other worsening pain, redness or visual disturbance.  Respiratory: Negative for choking or stridor Cardiovascular: Negative for other chest pain and palpitations.  Gastrointestinal: Negative for worsening diarrhea, blood in stool, or abdominal distention Genitourinary: Negative for hematuria, flank pain or change in urine volume.  Musculoskeletal: Negative for myalgias or other joint complaints.  Skin: Negative for other color change and wound or drainage.  Neurological: Negative for syncope and numbness. other than noted Hematological: Negative for adenopathy. or other swelling Psychiatric/Behavioral: Negative for hallucinations, SI, self-injury, decreased concentration or other worsening agitation.      Objective:   Physical Exam BP 138/80 mmHg  Pulse 94  Temp(Src) 98.1 F (36.7 C) (Oral)  Resp 20  Wt 142 lb (64.411 kg)  SpO2 94% VS noted,  Constitutional: Pt is oriented to person, place, and time. Appears well-developed and well-nourished, in no significant distress Head: Normocephalic and atraumatic  Eyes: Conjunctivae and EOM are normal. Pupils are equal, round, and reactive to light Right Ear: External ear normal.  Left Ear: External ear normal Nose: Nose normal.  Mouth/Throat: Oropharynx is clear and moist  Neck: Normal range of motion. Neck supple. No JVD present. No tracheal deviation present or significant neck LA or mass Cardiovascular: Normal rate, regular rhythm, normal heart sounds and intact distal pulses.   Pulmonary/Chest: Effort normal and breath sounds without rales or wheezing  Abdominal: Soft. Bowel sounds are normal. NT. No HSM    Musculoskeletal: Normal range of motion. Exhibits no edema Lymphadenopathy: Has no cervical adenopathy.  Neurological: Pt is alert and oriented to person, place, and time. Pt has normal reflexes. No cranial nerve deficit. Motor grossly intact Skin: Skin is warm and dry. No rash noted or new ulcers Psychiatric:  Has mild to mod saddened mood and affect. Behavior is normal.   Most recent echo: dec 2016 Study Conclusions  - Left ventricle: The cavity size was normal. Wall thickness was  increased in a pattern of mild LVH. Systolic function was normal.  The estimated ejection fraction was in the range of 60% to 65%.  Wall motion was normal; there were no regional wall motion  abnormalities. The study is not technically sufficient to allow  evaluation of LV diastolic function. - Aortic valve: Sclerosis without stenosis. There was mild to  moderate regurgitation. - Mitral valve: Mildly thickened leaflets . There was mild to  moderate, posteriorly directed regurgitation. - Left atrium:  Moderaetely dilated at 39 ml/m2. - Right ventricle: The cavity size was normal. Wall thickness was  normal. Pacer wire or catheter noted in right ventricle. Systolic  function is reduced. - Right atrium: The atrium was normal in size. Pacer wire or  catheter noted in right atrium. - Tricuspid valve: There was moderate regurgitation. - Pulmonary arteries: PA peak pressure: 39 mm Hg (S).  Impressions:  - LVEF 60-65%, mild LVH, normal wall motion, moderate LAE, aortic  valve sclerosis with mild to moderate central regurgitation, mild  to moderate posteriorly directed MR, moderate TR, pacing/AICD  wires noted, RVSP 39 mmHg.  ------------------------------------------------------------------- Labs, prior tests, procedures, and surgery: ICD system implantation. Currently implanted device: Medtronicimplantable cardioverter defibrillator.  Transthoracic echocardiography. M-mode,  complete 2D, spectral Doppler, and color Doppler. Birthdate: Patient birthdate: 06-Jun-1929. Age: Patient is 82 yr old. Sex: Gender: female. BMI: 25.5 kg/m^2. Blood pressure: 118/74 Patient status: Outpatient. Study date: Study date: 02/06/2015. Study time: 03:56 PM. Location: [facility]     Assessment & Plan:

## 2015-07-13 NOTE — Progress Notes (Signed)
Pre visit review using our clinic review tool, if applicable. No additional management support is needed unless otherwise documented below in the visit note. 

## 2015-07-14 NOTE — Assessment & Plan Note (Signed)
stable overall by history and exam, recent data reviewed with pt, and pt to continue medical treatment as before,  to f/u any worsening symptoms or concerns Lab Results  Component Value Date   LDLCALC 66 12/01/2014

## 2015-07-14 NOTE — Assessment & Plan Note (Signed)
stable overall by history and exam, recent data reviewed with pt, and pt to continue medical treatment as before,  to f/u any worsening symptoms or concerns Lab Results  Component Value Date   TSH 0.21* 12/01/2014   For f/u lab - ? Need synthroid reduced

## 2015-07-14 NOTE — Assessment & Plan Note (Signed)

## 2015-07-14 NOTE — Assessment & Plan Note (Signed)
stable overall by history and exam, and pt to continue medical treatment as before,  to f/u any worsening symptoms or concerns 

## 2015-07-18 ENCOUNTER — Telehealth: Payer: Self-pay | Admitting: Internal Medicine

## 2015-07-18 NOTE — Telephone Encounter (Signed)
New Message  Pt requested to speak w/ Device about medtronic monitor- would not go in to detail. Please call back and discuss.  a

## 2015-07-18 NOTE — Telephone Encounter (Signed)
Pt was calling about her husband home monitor.

## 2015-07-19 ENCOUNTER — Other Ambulatory Visit: Payer: Self-pay | Admitting: Internal Medicine

## 2015-07-19 ENCOUNTER — Other Ambulatory Visit: Payer: Self-pay | Admitting: General Practice

## 2015-07-19 MED ORDER — WARFARIN SODIUM 5 MG PO TABS: ORAL_TABLET | ORAL | Status: DC

## 2015-07-19 NOTE — Telephone Encounter (Signed)
Please advise 

## 2015-07-20 ENCOUNTER — Telehealth: Payer: Self-pay | Admitting: Internal Medicine

## 2015-07-20 ENCOUNTER — Ambulatory Visit (INDEPENDENT_AMBULATORY_CARE_PROVIDER_SITE_OTHER): Payer: Medicare Other | Admitting: General Practice

## 2015-07-20 DIAGNOSIS — I4891 Unspecified atrial fibrillation: Secondary | ICD-10-CM | POA: Diagnosis not present

## 2015-07-20 DIAGNOSIS — Z5181 Encounter for therapeutic drug level monitoring: Secondary | ICD-10-CM | POA: Diagnosis not present

## 2015-07-20 DIAGNOSIS — I749 Embolism and thrombosis of unspecified artery: Secondary | ICD-10-CM

## 2015-07-20 LAB — POCT INR: INR: 1.8

## 2015-07-20 NOTE — Progress Notes (Signed)
Pre visit review using our clinic review tool, if applicable. No additional management support is needed unless otherwise documented below in the visit note. 

## 2015-07-20 NOTE — Telephone Encounter (Signed)
Received call from optho at [facility], who found a type of plaque to the retina  Suggestion: is to check lipids, cbc and carotids  These will be ordered,   Corinne to let pt know to have labs done

## 2015-07-20 NOTE — Telephone Encounter (Signed)
Patient aware and will come 07/23/2015

## 2015-07-20 NOTE — Progress Notes (Signed)
I have reviewed and agree with the plan. 

## 2015-07-23 ENCOUNTER — Other Ambulatory Visit (INDEPENDENT_AMBULATORY_CARE_PROVIDER_SITE_OTHER): Payer: Medicare Other

## 2015-07-23 DIAGNOSIS — I749 Embolism and thrombosis of unspecified artery: Secondary | ICD-10-CM

## 2015-07-23 LAB — CBC WITH DIFFERENTIAL/PLATELET
BASOS PCT: 0.5 % (ref 0.0–3.0)
Basophils Absolute: 0 10*3/uL (ref 0.0–0.1)
EOS PCT: 1.3 % (ref 0.0–5.0)
Eosinophils Absolute: 0.1 10*3/uL (ref 0.0–0.7)
HEMATOCRIT: 40.8 % (ref 36.0–46.0)
HEMOGLOBIN: 13.9 g/dL (ref 12.0–15.0)
Lymphocytes Relative: 29 % (ref 12.0–46.0)
Lymphs Abs: 1.8 10*3/uL (ref 0.7–4.0)
MCHC: 34 g/dL (ref 30.0–36.0)
MCV: 89.8 fl (ref 78.0–100.0)
MONO ABS: 0.5 10*3/uL (ref 0.1–1.0)
Monocytes Relative: 7.4 % (ref 3.0–12.0)
NEUTROS ABS: 3.8 10*3/uL (ref 1.4–7.7)
Neutrophils Relative %: 61.8 % (ref 43.0–77.0)
Platelets: 208 10*3/uL (ref 150.0–400.0)
RBC: 4.54 Mil/uL (ref 3.87–5.11)
RDW: 14.6 % (ref 11.5–15.5)
WBC: 6.1 10*3/uL (ref 4.0–10.5)

## 2015-07-23 LAB — LIPID PANEL
CHOLESTEROL: 115 mg/dL (ref 0–200)
HDL: 38.6 mg/dL — ABNORMAL LOW (ref >39.00)
LDL Cholesterol: 58 mg/dL (ref 0–99)
NonHDL: 76.3
TRIGLYCERIDES: 93 mg/dL (ref 0.0–149.0)
Total CHOL/HDL Ratio: 3
VLDL: 18.6 mg/dL (ref 0.0–40.0)

## 2015-07-27 ENCOUNTER — Encounter: Payer: Self-pay | Admitting: Cardiology

## 2015-07-29 LAB — CUP PACEART REMOTE DEVICE CHECK
Brady Statistic AP VP Percent: 0 %
Brady Statistic AP VS Percent: 0 %
Brady Statistic AS VP Percent: 99.95 %
Brady Statistic AS VS Percent: 0.05 %
Brady Statistic RV Percent Paced: 99.95 %
HIGH POWER IMPEDANCE MEASURED VALUE: 323 Ohm
HighPow Impedance: 44 Ohm
HighPow Impedance: 67 Ohm
Implantable Lead Implant Date: 20080908
Implantable Lead Implant Date: 20100121
Implantable Lead Implant Date: 20100121
Implantable Lead Implant Date: 20130704
Implantable Lead Location: 753860
Implantable Lead Model: 4196
Implantable Lead Model: 5076
Implantable Lead Model: 7120
Lead Channel Impedance Value: 437 Ohm
Lead Channel Impedance Value: 494 Ohm
Lead Channel Impedance Value: 589 Ohm
Lead Channel Pacing Threshold Amplitude: 1.375 V
Lead Channel Sensing Intrinsic Amplitude: 1.5 mV
Lead Channel Sensing Intrinsic Amplitude: 1.5 mV
Lead Channel Setting Pacing Amplitude: 2.5 V
Lead Channel Setting Pacing Pulse Width: 0.4 ms
Lead Channel Setting Pacing Pulse Width: 0.8 ms
Lead Channel Setting Sensing Sensitivity: 0.3 mV
MDC IDC LEAD LOCATION: 753858
MDC IDC LEAD LOCATION: 753859
MDC IDC LEAD LOCATION: 753860
MDC IDC MSMT BATTERY VOLTAGE: 2.64 V
MDC IDC MSMT LEADCHNL LV IMPEDANCE VALUE: 950 Ohm
MDC IDC MSMT LEADCHNL LV PACING THRESHOLD PULSEWIDTH: 0.8 ms
MDC IDC MSMT LEADCHNL RV IMPEDANCE VALUE: 456 Ohm
MDC IDC MSMT LEADCHNL RV PACING THRESHOLD AMPLITUDE: 0.625 V
MDC IDC MSMT LEADCHNL RV PACING THRESHOLD PULSEWIDTH: 0.4 ms
MDC IDC SESS DTM: 20170501062608
MDC IDC SET LEADCHNL RV PACING AMPLITUDE: 2.5 V
MDC IDC STAT BRADY RA PERCENT PACED: 0 %

## 2015-08-15 IMAGING — CT CT ABD-PELV W/ CM
2 of 5 series · 16 of 46 positions shown, 18 images · IV contrast (Omni 300)
Comparison: CT of the abdomen and pelvis from 06/01/2013

CLINICAL DATA: Acute onset of anal bleeding. Lower abdominal pain.
Patient on Coumadin. Initial encounter.

EXAM:
CT ABDOMEN AND PELVIS WITH CONTRAST
TECHNIQUE: Multidetector CT imaging of the abdomen and pelvis was performed
using the standard protocol following bolus administration of
intravenous contrast.
CONTRAST:  80mL OMNIPAQUE IOHEXOL 300 MG/ML  SOLN

[Series 2: abd/ pelvis 5.0 i30f 1 · axial · 0.90mm/px · z∈[+846,+1261]mm · 13 of 93 slices shown, 15 images]
[im 5/93  soft-tissue]
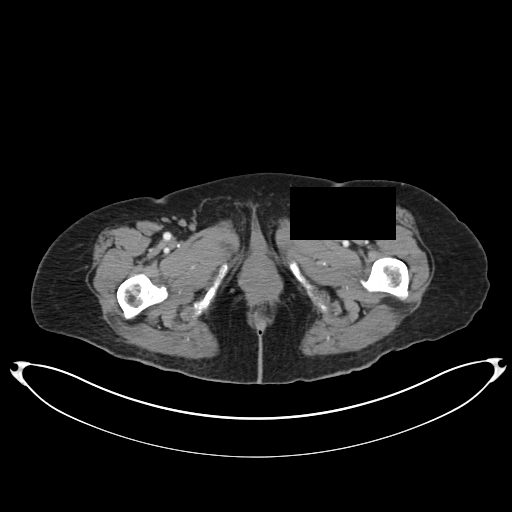
[im 5/93  bone]
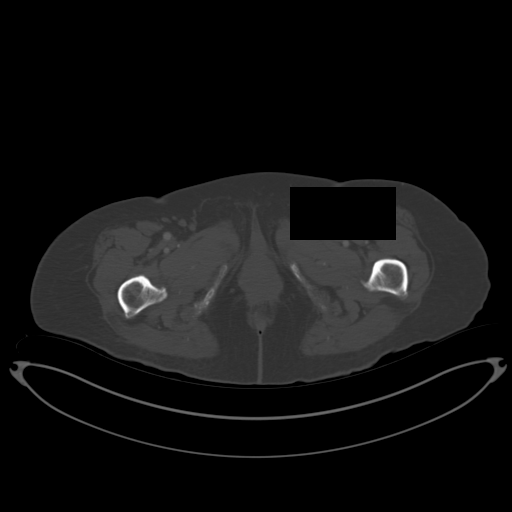
[im 15/93  soft-tissue]
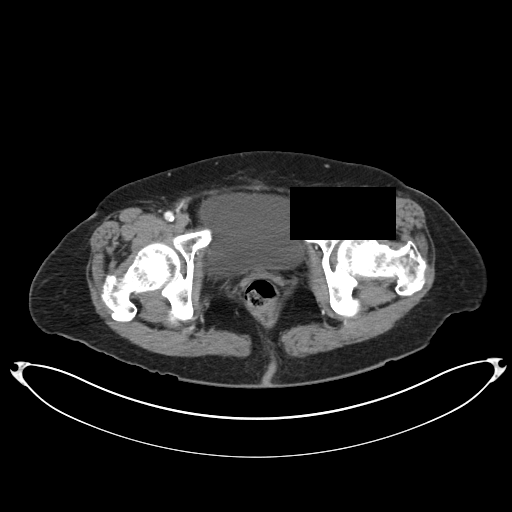
[im 20/93  soft-tissue]
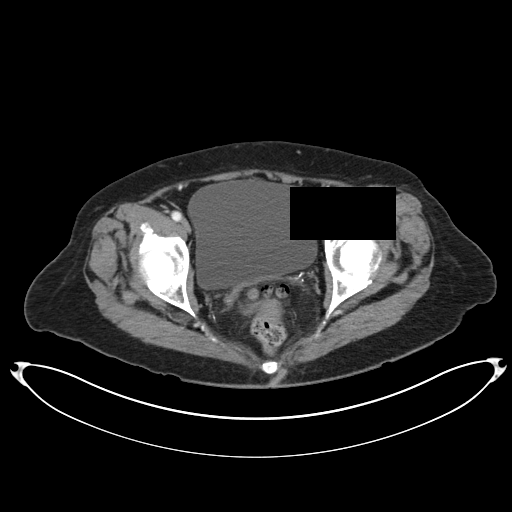
[im 25/93  soft-tissue]
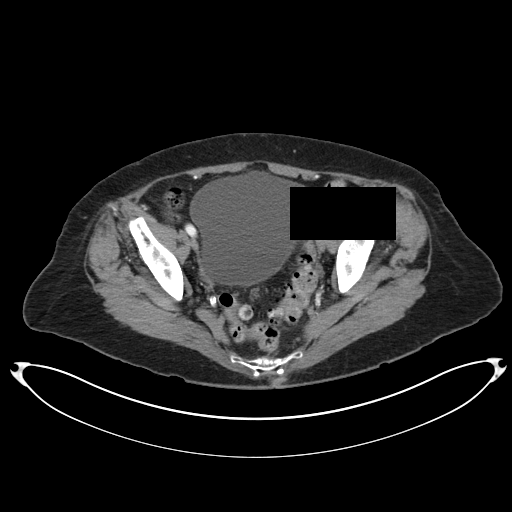
[im 34/93  soft-tissue]
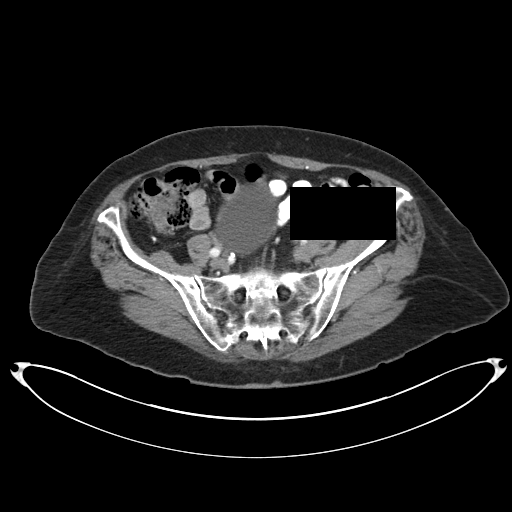
[im 39/93  soft-tissue]
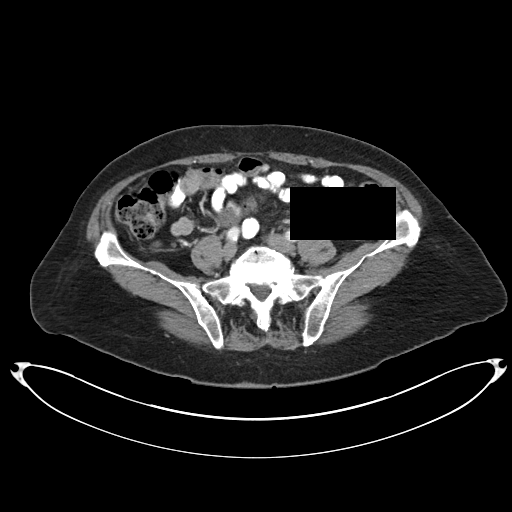
[im 49/93  soft-tissue]
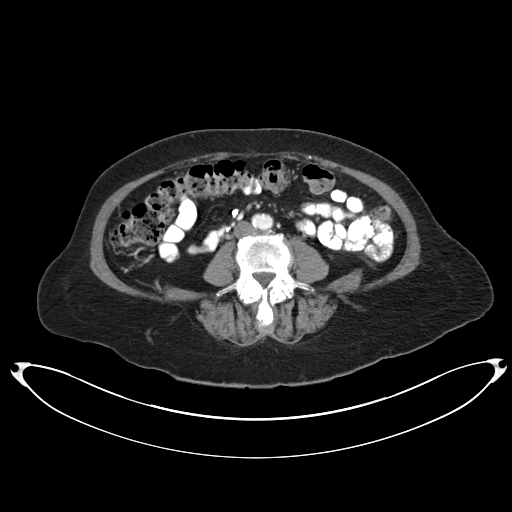
[im 54/93  soft-tissue]
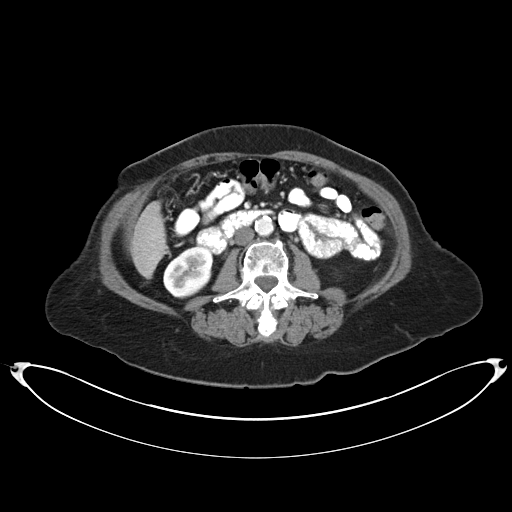
[im 59/93  soft-tissue]
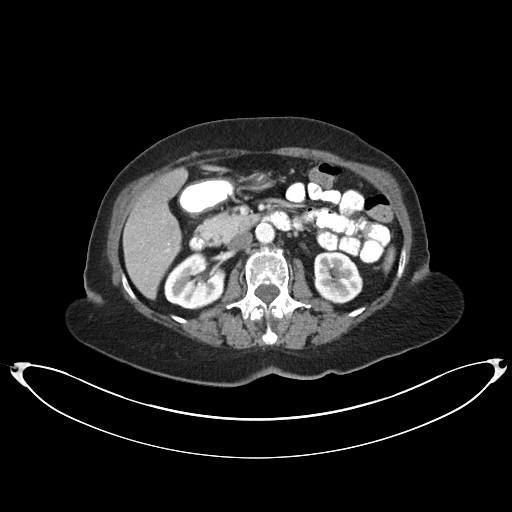
[im 59/93  bone]
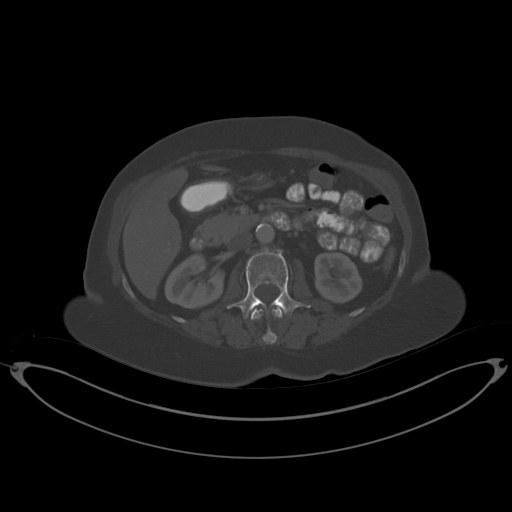
[im 68/93  soft-tissue]
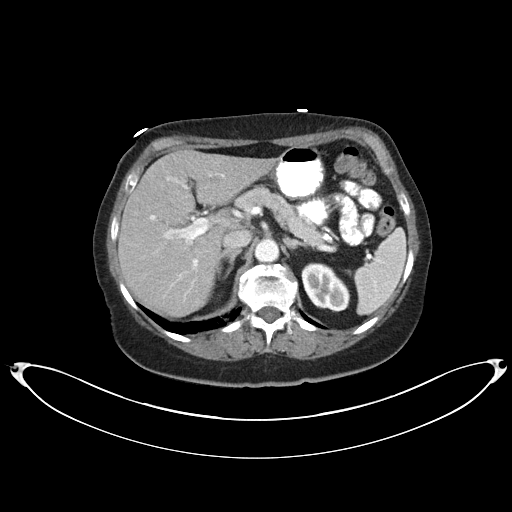
[im 73/93  soft-tissue]
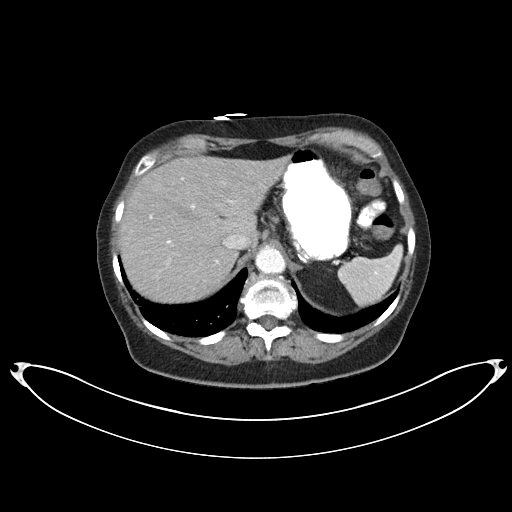
[im 78/93  soft-tissue]
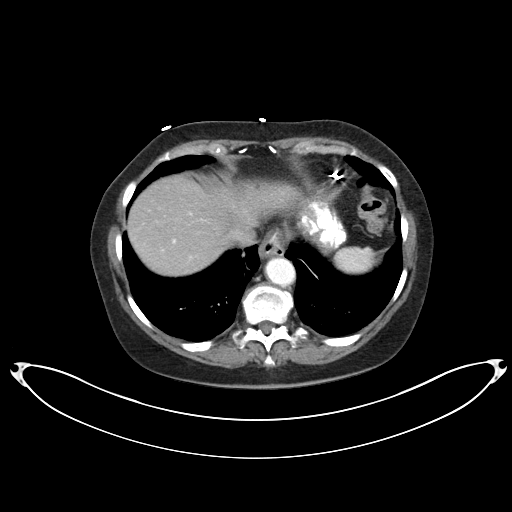
[im 88/93  soft-tissue]
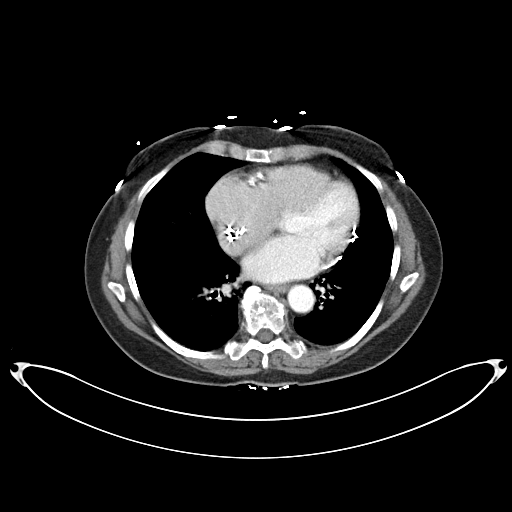

[Series 5: coronals · coronal · 0.79mm/px · 3 of 132 slices shown]
[im 44/132  soft-tissue]
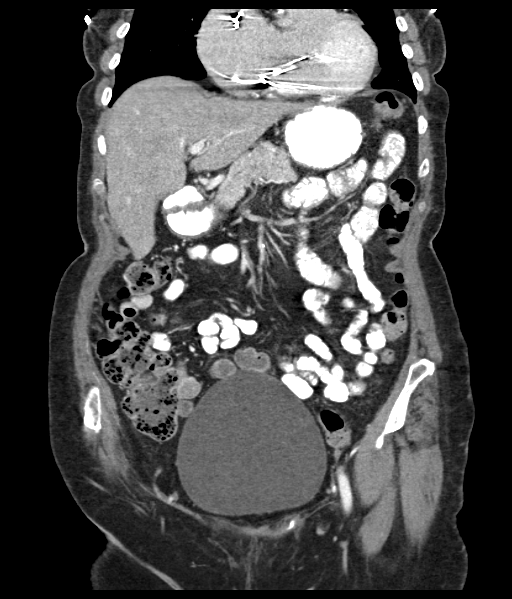
[im 59/132  soft-tissue]
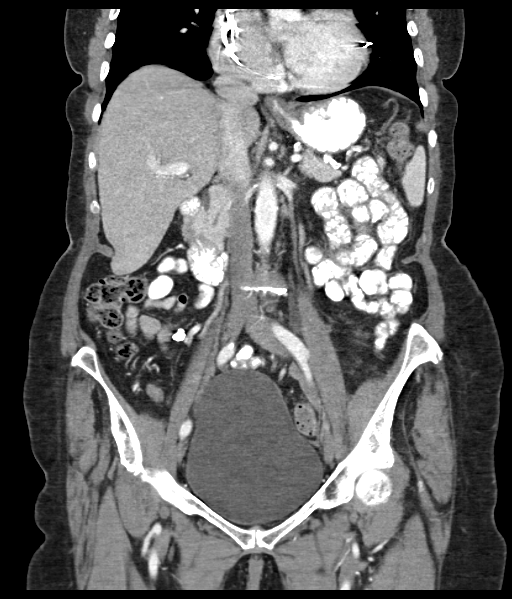
[im 73/132  soft-tissue]
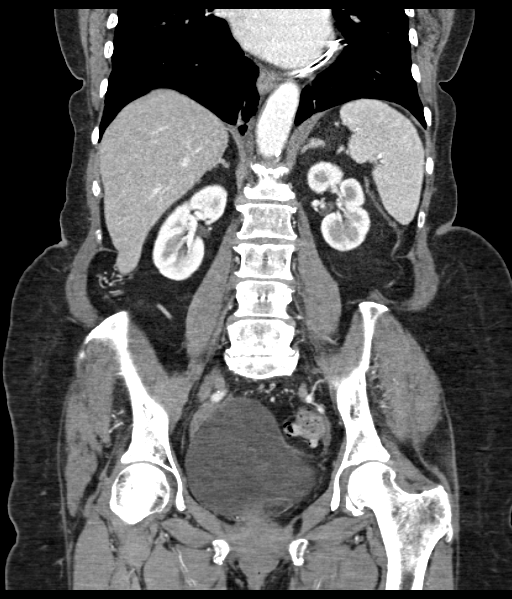

[16 of 46 positions shown; findings below may reference images not displayed]

FINDINGS: Mild bibasilar atelectasis or scarring is noted. Pacemaker leads are
partially imaged. Diffuse coronary artery calcifications are seen.

A 1.5 cm cyst is noted at the right hepatic lobe. The liver and
spleen are otherwise unremarkable. The patient is status post
cholecystectomy. The pancreas and adrenal glands are unremarkable.

The kidneys are unremarkable in appearance. There is no evidence of
hydronephrosis. No renal or ureteral stones are seen. No perinephric
stranding is appreciated.

No free fluid is identified. The small bowel is unremarkable in
appearance. The stomach is within normal limits. No acute vascular
abnormalities are seen. Scattered calcification is noted along the
abdominal aorta and its branches.

The patient is status post appendectomy. Scattered diverticulosis is
noted along the sigmoid colon, without evidence of diverticulitis.

The bladder is moderately distended and grossly unremarkable. The
patient is status post hysterectomy. No suspicious adnexal masses
are seen. The ovaries appear grossly symmetric. No inguinal
lymphadenopathy is seen.

No acute osseous abnormalities are identified. Multilevel vacuum
phenomenon is noted along the lower thoracic and lumbar spine.
Underlying facet disease is noted.
IMPRESSION: 1. No definite abnormalities seen to explain the patient's anal
bleeding.
2. Scattered diverticulosis along the sigmoid colon, without
evidence of diverticulitis.
3. Mild bibasilar atelectasis or scarring noted.
4. Small hepatic cyst noted.
5. Diffuse coronary artery calcifications seen.
6. Scattered calcification along the abdominal aorta and its
branches.
7. Minimal degenerative change along the lower thoracic and lumbar
spine.

## 2015-08-17 ENCOUNTER — Ambulatory Visit (INDEPENDENT_AMBULATORY_CARE_PROVIDER_SITE_OTHER): Payer: Medicare Other | Admitting: Internal Medicine

## 2015-08-17 ENCOUNTER — Encounter: Payer: Self-pay | Admitting: Internal Medicine

## 2015-08-17 ENCOUNTER — Ambulatory Visit (INDEPENDENT_AMBULATORY_CARE_PROVIDER_SITE_OTHER): Payer: Medicare Other | Admitting: General Practice

## 2015-08-17 VITALS — BP 120/72 | HR 96 | Temp 98.7°F | Resp 20 | Wt 136.0 lb

## 2015-08-17 DIAGNOSIS — E890 Postprocedural hypothyroidism: Secondary | ICD-10-CM

## 2015-08-17 DIAGNOSIS — I4891 Unspecified atrial fibrillation: Secondary | ICD-10-CM | POA: Diagnosis not present

## 2015-08-17 DIAGNOSIS — Z5181 Encounter for therapeutic drug level monitoring: Secondary | ICD-10-CM

## 2015-08-17 DIAGNOSIS — F411 Generalized anxiety disorder: Secondary | ICD-10-CM

## 2015-08-17 DIAGNOSIS — E785 Hyperlipidemia, unspecified: Secondary | ICD-10-CM

## 2015-08-17 LAB — POCT INR: INR: 2.3

## 2015-08-17 NOTE — Progress Notes (Signed)
Subjective:    Patient ID: Michele [name], female    DOB: 02-11-1930, 82 y.o.   MRN: [id number]  HPI  Here to f/u; overall doing ok,  Pt denies chest pain, increasing sob or doe, wheezing, orthopnea, PND, increased LE swelling, palpitations, dizziness or syncope.  Pt denies new neurological symptoms such as new headache, or facial or extremity weakness or numbness.  Pt denies polydipsia, polyuria, or low sugar episode.   Pt denies new neurological symptoms such as new headache, or facial or extremity weakness or numbness.   Pt states overall good compliance with meds, mostly trying to follow appropriate diet, with wt overall stable,  ? Early mild confusion/memory dysfxn - pt denies Denies hyper or hypo thyroid symptoms such as voice, skin or hair change.  Denies worsening depressive symptoms, suicidal ideation, or panic; has ongoing anxiety, not increased recently.  Past Medical History  Diagnosis Date  . Cardiomyopathy     RATE RELATED  . Diverticulosis     DIVERTICULITIS  . C. difficile colitis   . Atrial fibrillation ([place])     NEG STRESS CARDIOLITE  . Gilbert's syndrome   . Hyperlipidemia     NMR LIOPROFILE LDL 234(3206/2234)HDL 37,TG86  . Thyroid disease     HYPOTHYROIDISM...THYROID NODULES  . Diverticulitis     PMH of X 2  . Hx of colonic polyp 2011    Dr Fuller Plan  . CHF (congestive heart failure) [facility])    Past Surgical History  Procedure Laterality Date  . Biopsy thyroid      SINGLE NODULE  . Throidectomy 12/08      benign  . Appendectomy  1956  . Cholecystectomy  1972  . Abdominal hysterectomy  1973  . Pacemaker placement      10/2006  . Icd....02/2008    . Breast biopsy  1997  . Rotator cuff repair      x2  . Colonoscopy with polypectomy  2011  . Cardiac defibrillator placement    . Lead revision N/A 08/21/2011    Procedure: LEAD REVISION;  Surgeon: Deboraha Sprang, MD;  Location: [facility] CATH LAB;  Service: Cardiovascular;  Laterality: N/A;    reports that she has never  smoked. She does not have any smokeless tobacco history on file. She reports that she does not drink alcohol or use illicit drugs. family history includes Heart attack (age of onset: 90) in her brother; Heart attack (age of onset: 72) in her mother; Lung disease in her father; Ulcerative colitis in her son; Vasculitis in her sister. Allergies  Allergen Reactions  . Cephalexin Hives  . Erythromycin Swelling and Other (See Comments)    REACTION: swollen and sore mouth  . Terbinafine Hcl     Rash   . Colchicine Other (See Comments)    REACTION: violent diarrhea  . Rosuvastatin Other (See Comments)    REACTION: upper arm pain bilaterally   Current Outpatient Prescriptions on File Prior to Visit  Medication Sig Dispense Refill  . cholecalciferol (VITAMIN D) 1000 UNITS tablet Take 1,000 Units by mouth daily.    Marland Kitchen co-enzyme Q-10 30 MG capsule Take 30 mg by mouth daily.    Marland Kitchen ezetimibe-simvastatin (VYTORIN) 10-20 MG tablet TAKE ONE TABLET AT BEDTIME. 90 tablet 3  . furosemide (LASIX) 20 MG tablet TAKE 1 TABLET ONCE DAILY AS NEEDED FOR FLUID. 30 tablet 2  . levothyroxine (SYNTHROID) 112 MCG tablet 1 qd EXCEPT 1/2 T,Th,& Sat 85 tablet 3  . Multiple Vitamin (MULTIVITAMIN WITH  MINERALS) TABS Take 1 tablet by mouth daily.    Marland Kitchen saccharomyces boulardii (FLORASTOR) 250 MG capsule Take 250 mg by mouth daily.      Marland Kitchen warfarin (COUMADIN) 5 MG tablet TAKE AS DIRECTED. 30 tablet 3   No current facility-administered medications on file prior to visit.   Review of Systems  Constitutional: Negative for unusual diaphoresis or night sweats HENT: Negative for ear swelling or discharge Eyes: Negative for worsening visual haziness  Respiratory: Negative for choking and stridor.   Gastrointestinal: Negative for distension or worsening eructation Genitourinary: Negative for retention or change in urine volume.  Musculoskeletal: Negative for other MSK pain or swelling Skin: Negative for color change and worsening  wound Neurological: Negative for tremors and numbness other than noted  Psychiatric/Behavioral: Negative for decreased concentration or agitation other than above       Objective:   Physical Exam BP 120/72 mmHg  Pulse 96  Temp(Src) 98.7 F (37.1 C) (Oral)  Resp 20  Wt 136 lb (61.689 kg)  SpO2 96% VS noted,  Constitutional: Pt appears in no apparent distress HENT: Head: NCAT.  Right Ear: External ear normal.  Left Ear: External ear normal.  Eyes: . Pupils are equal, round, and reactive to light. Conjunctivae and EOM are normal Neck: Normal range of motion. Neck supple.  Cardiovascular: Normal rate and regular rhythm.   Pulmonary/Chest: Effort normal and breath sounds without rales or wheezing.  Neurological: Pt is alert. ? Mild confused , motor grossly intact Skin: Skin is warm. No rash, no LE edema Psychiatric: Pt behavior is normal. No agitation. mild nervous    Assessment & Plan:

## 2015-08-17 NOTE — Progress Notes (Signed)
Pre visit review using our clinic review tool, if applicable. No additional management support is needed unless otherwise documented below in the visit note. 

## 2015-08-17 NOTE — Patient Instructions (Signed)

## 2015-08-17 NOTE — Progress Notes (Signed)
I have reviewed and agree with the plan. 

## 2015-08-18 NOTE — Assessment & Plan Note (Signed)
Goal ldl < 100,  Lab Results  Component Value Date   LDLCALC 58 07/23/2015   stable overall by history and exam, recent data reviewed with pt, and pt to continue medical treatment as before,  to f/u any worsening symptoms or concerns

## 2015-08-18 NOTE — Assessment & Plan Note (Signed)
With ? Mild pseudodementia, declines further tx such as SSRI trial

## 2015-08-18 NOTE — Assessment & Plan Note (Signed)
Asymtp, stable overall by history and exam,  and pt to continue medical treatment as before,  to f/u any worsening symptoms or concerns, for f/u lab

## 2015-08-22 ENCOUNTER — Telehealth: Payer: Self-pay | Admitting: Internal Medicine

## 2015-08-22 NOTE — Telephone Encounter (Signed)
New message   Pt call requesting to speak with RN about uncontrollable diarrhea. Please call back to discuss

## 2015-08-22 NOTE — Telephone Encounter (Signed)
I called and spoke with the patient. She reports that she has been having uncontrollable diarrhea since Monday.  She reports no change in medications and does not think she is running a fever. She reports she has been staying hydrated with water and has stayed off solid food x 2 days, but when she has eaten, it just "runs right through me." She reports that she is having dizziness, which is not new- she is taking PT for her dizziness/ vertigo. I have advised her to stay hydrated and that she could try immodium for the diarrhea. I have also advised her to call her PCP for further advice. She is agreeable.

## 2015-08-22 NOTE — Telephone Encounter (Deleted)
Error

## 2015-09-14 ENCOUNTER — Ambulatory Visit (INDEPENDENT_AMBULATORY_CARE_PROVIDER_SITE_OTHER): Payer: Medicare Other | Admitting: General Practice

## 2015-09-14 DIAGNOSIS — I4891 Unspecified atrial fibrillation: Secondary | ICD-10-CM | POA: Diagnosis not present

## 2015-09-14 DIAGNOSIS — Z5181 Encounter for therapeutic drug level monitoring: Secondary | ICD-10-CM

## 2015-09-14 LAB — POCT INR: INR: 2.8

## 2015-09-14 NOTE — Progress Notes (Signed)
I have reviewed and agree with the plan. 

## 2015-09-17 ENCOUNTER — Ambulatory Visit (INDEPENDENT_AMBULATORY_CARE_PROVIDER_SITE_OTHER): Payer: Medicare Other | Admitting: Internal Medicine

## 2015-09-17 ENCOUNTER — Encounter: Payer: Self-pay | Admitting: Internal Medicine

## 2015-09-17 VITALS — BP 118/74 | HR 73 | Ht 64.0 in | Wt 132.2 lb

## 2015-09-17 DIAGNOSIS — I429 Cardiomyopathy, unspecified: Secondary | ICD-10-CM | POA: Diagnosis not present

## 2015-09-17 DIAGNOSIS — I442 Atrioventricular block, complete: Secondary | ICD-10-CM | POA: Diagnosis not present

## 2015-09-17 DIAGNOSIS — Z95810 Presence of automatic (implantable) cardiac defibrillator: Secondary | ICD-10-CM

## 2015-09-17 DIAGNOSIS — I428 Other cardiomyopathies: Secondary | ICD-10-CM

## 2015-09-17 LAB — CUP PACEART INCLINIC DEVICE CHECK
Battery Voltage: 2.63 V
Brady Statistic AP VP Percent: 0 %
Brady Statistic AP VS Percent: 0 %
Brady Statistic AS VP Percent: 99.87 %
Brady Statistic AS VS Percent: 0.13 %
Date Time Interrogation Session: 20170731135131
HIGH POWER IMPEDANCE MEASURED VALUE: 342 Ohm
HIGH POWER IMPEDANCE MEASURED VALUE: 45 Ohm
HighPow Impedance: 71 Ohm
Implantable Lead Implant Date: 20100121
Implantable Lead Implant Date: 20130704
Implantable Lead Location: 753858
Implantable Lead Location: 753859
Implantable Lead Model: 7120
Lead Channel Impedance Value: 456 Ohm
Lead Channel Impedance Value: 532 Ohm
Lead Channel Impedance Value: 893 Ohm
Lead Channel Pacing Threshold Amplitude: 1.25 V
Lead Channel Pacing Threshold Pulse Width: 0.4 ms
Lead Channel Pacing Threshold Pulse Width: 0.8 ms
Lead Channel Sensing Intrinsic Amplitude: 2.25 mV
Lead Channel Sensing Intrinsic Amplitude: 21.75 mV
Lead Channel Sensing Intrinsic Amplitude: 21.75 mV
Lead Channel Setting Pacing Pulse Width: 0.4 ms
Lead Channel Setting Pacing Pulse Width: 0.8 ms
Lead Channel Setting Sensing Sensitivity: 0.3 mV
MDC IDC LEAD IMPLANT DT: 20080908
MDC IDC LEAD IMPLANT DT: 20100121
MDC IDC LEAD LOCATION: 753860
MDC IDC LEAD LOCATION: 753860
MDC IDC LEAD MODEL: 4196
MDC IDC MSMT LEADCHNL LV IMPEDANCE VALUE: 494 Ohm
MDC IDC MSMT LEADCHNL RA IMPEDANCE VALUE: 456 Ohm
MDC IDC MSMT LEADCHNL RA SENSING INTR AMPL: 2 mV
MDC IDC MSMT LEADCHNL RV PACING THRESHOLD AMPLITUDE: 0.75 V
MDC IDC SET LEADCHNL LV PACING AMPLITUDE: 2.5 V
MDC IDC SET LEADCHNL RV PACING AMPLITUDE: 2.5 V
MDC IDC STAT BRADY RA PERCENT PACED: 0 %
MDC IDC STAT BRADY RV PERCENT PACED: 99.87 %

## 2015-09-17 NOTE — Progress Notes (Signed)
Patient Care Team: Biagio Borg, MD as PCP - General (Internal Medicine)   HPI  Michele [name] is a 82 y.o. 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   Echo 2010 EF 55-60% improved from 25-30%  Echocardiogram 12/16 retained normal LV function  .  She has done quite well. The patient denies chest pain, shortness of breath, nocturnal dyspnea, orthopnea or peripheral edema.  There have been no palpitations, lightheadedness or syncope.    Her husband died at the end of 07-12-22. She is doing relatively well.  Her children have been to visit.  No bleeding issues  Past Medical History:  Diagnosis Date  . Atrial fibrillation ([place])    NEG STRESS CARDIOLITE  . C. difficile colitis   . Cardiomyopathy    RATE RELATED  . CHF (congestive heart failure) ([place])   . Diverticulitis    PMH of X 2  . Diverticulosis    DIVERTICULITIS  . Gilbert's syndrome   . Hx of colonic polyp 2011   Dr Fuller Plan  . Hyperlipidemia    NMR LIOPROFILE LDL 234(3206/2234)HDL 37,TG86  . Thyroid disease    HYPOTHYROIDISM...THYROID NODULES    Past Surgical History:  Procedure Laterality Date  . ABDOMINAL HYSTERECTOMY  1973  . APPENDECTOMY  1956  . BIOPSY THYROID     SINGLE NODULE  . BREAST BIOPSY  1997  . CARDIAC DEFIBRILLATOR PLACEMENT    . CHOLECYSTECTOMY  1972  . colonoscopy with polypectomy  2011  . ICD....02/2008    . LEAD REVISION N/A 08/21/2011   Procedure: LEAD REVISION;  Surgeon: Deboraha Sprang, MD;  Location: [facility] CATH LAB;  Service: Cardiovascular;  Laterality: N/A;  . PACEMAKER PLACEMENT     10/2006  . ROTATOR CUFF REPAIR     x2  . THROIDECTOMY 12/08     benign    Current Outpatient Prescriptions  Medication Sig Dispense Refill  . cholecalciferol (VITAMIN D) 1000 UNITS tablet Take 1,000 Units by mouth daily.    Marland Kitchen co-enzyme  Q-10 30 MG capsule Take 30 mg by mouth daily.    Marland Kitchen ezetimibe-simvastatin (VYTORIN) 10-20 MG tablet TAKE ONE TABLET AT BEDTIME. 90 tablet 3  . furosemide (LASIX) 20 MG tablet TAKE 1 TABLET ONCE DAILY AS NEEDED FOR FLUID. 30 tablet 2  . levothyroxine (SYNTHROID) 112 MCG tablet 1 qd EXCEPT 1/2 T,Th,& Sat 85 tablet 3  . Multiple Vitamin (MULTIVITAMIN WITH MINERALS) TABS Take 1 tablet by mouth daily.    Marland Kitchen saccharomyces boulardii (FLORASTOR) 250 MG capsule Take 250 mg by mouth daily.      Marland Kitchen warfarin (COUMADIN) 5 MG tablet TAKE AS DIRECTED. 30 tablet 3   No current facility-administered medications for this visit.     Allergies  Allergen Reactions  . Cephalexin Hives  . Erythromycin Swelling and Other (See Comments)    REACTION: swollen and sore mouth  . Terbinafine Hcl     Rash   . Colchicine Other (See Comments)    REACTION: violent diarrhea  . Rosuvastatin Other (See Comments)    REACTION: upper arm pain bilaterally    Review of Systems negative except from HPI and PMH  Physical Exam BP 118/74   Pulse 73   Ht 5\' 4"  (1.626 m)   Wt 132 lb 3.2 oz (60 kg)   SpO2 90%   BMI 22.69 kg/m  Well developed and  nourished in no acute distress HENT normal Neck supple with JVP-flat Clear Regular rate and rhythm, no murmurs or gallops Device pocket well healed; without hematoma or erythema.  There is no tethering  Abd-soft with active BS No Clubbing cyanosis edema Skin-warm and dry A & Oriented  Grossly normal sensory and motor function  ECG demonstrates ventricular pacing with underlying atrial fibrillation    Assessment and  Plan Atrial fibrillation-permanent  Complete heart block  S/p AV ablation  Nonischemic cardiomyopathy -resolved  Implantable defibrillator-biventricular-[employer] The patient's device was interrogated.  The information was reviewed. No changes were made in the programming.      She remains on anticoagulation.  Euvolemic continue current meds  Her  device is approaching ERI. We have reviewed risks and benefits of device generator replacement and I have broached the issue as to whether she should have CRT-D generator replacement or CRT-P downgrade  Given that there has been interval normalization of LV systolic function and her age I would probably favor the latter  Continue current meds

## 2015-09-17 NOTE — Patient Instructions (Signed)

## 2015-09-19 ENCOUNTER — Encounter: Payer: Self-pay | Admitting: Internal Medicine

## 2015-09-24 ENCOUNTER — Telehealth: Payer: Self-pay | Admitting: Internal Medicine

## 2015-09-24 NOTE — Telephone Encounter (Signed)
New message       Pt was told that she needs a new ICD battery.  She needs to go to [place] for 3 weeks.  No date has been set, but will it be ok to go to [place] for 3 weeks?

## 2015-09-24 NOTE — Telephone Encounter (Signed)
Called pt back and let her know that we would forward this to Dr. Aquilla Hacker nurse Nira Conn to discuss possible gen change before going to [place] for 3 weeks in September.

## 2015-09-25 NOTE — Telephone Encounter (Signed)
Has she actually tripped yet?

## 2015-09-26 ENCOUNTER — Telehealth: Payer: Self-pay

## 2015-09-26 NOTE — Telephone Encounter (Signed)
Called pt and let her know that Dr. Caryl Comes is ok with her going to [place] in September.

## 2015-10-10 ENCOUNTER — Telehealth: Payer: Self-pay | Admitting: Cardiology

## 2015-10-10 DIAGNOSIS — I428 Other cardiomyopathies: Secondary | ICD-10-CM

## 2015-10-10 DIAGNOSIS — Z01812 Encounter for preprocedural laboratory examination: Secondary | ICD-10-CM

## 2015-10-10 DIAGNOSIS — I4891 Unspecified atrial fibrillation: Secondary | ICD-10-CM

## 2015-10-10 NOTE — Telephone Encounter (Signed)
Pt called in and she stated that she heard alert tone for her ICD battery life alarmed this morning. I informed pt that we did receive her remote transmission and it informed us that her device had reached ERI. Informed pt that once this alert happens she has about 3 months remaining on her battery life. Offered pt an appt w/ PA on 10-24-15. Pt had further questions. Forwarded call to device tech.

## 2015-10-10 NOTE — Telephone Encounter (Signed)
Patient's device has reached RRT.  Discussed with patient, who is agreeable to appointment with Tommye Standard, PA on 10/24/15 at 11:30am to discuss generator changeout.  She declines a device clinic appointment to turn off the auditory alert as she does not drive.  Patient requests to speak with Dr. Caryl Comes as she is supposed to travel out of the country next week.  Advised patient that RRT will not interfere with her travel plans, but patient still requests to speak with Dr. Caryl Comes.  She is aware he will not be back in the office until tomorrow afternoon.  Patient verbalizes understanding of all information and denies additional questions or concerns at this time.  Routed to [facility], RN and Dr. Caryl Comes for review.

## 2015-10-12 ENCOUNTER — Ambulatory Visit (INDEPENDENT_AMBULATORY_CARE_PROVIDER_SITE_OTHER): Payer: Medicare Other | Admitting: General Practice

## 2015-10-12 DIAGNOSIS — Z5181 Encounter for therapeutic drug level monitoring: Secondary | ICD-10-CM | POA: Diagnosis not present

## 2015-10-12 LAB — POCT INR: INR: 2.7

## 2015-10-12 NOTE — Progress Notes (Signed)
I have reviewed and agree with the plan. 

## 2015-10-16 NOTE — Telephone Encounter (Signed)
Dr. Caryl Comes spoke with patient last week.  Anticipate generator change next available per Dr. Caryl Comes.

## 2015-10-16 NOTE — Telephone Encounter (Signed)
I left a message for the patient that I will try her back tomorrow in order to get her generator change scheduled.

## 2015-10-17 NOTE — Telephone Encounter (Signed)
I called and spoke with the patient. She does not have concrete travel plans yet. She is agreeable with trying to have her generator change done on 10/24/15. I advised I will call her tomorrow to confirm.

## 2015-10-18 ENCOUNTER — Encounter: Payer: Self-pay | Admitting: *Deleted

## 2015-10-18 NOTE — Telephone Encounter (Signed)
The patient is aware her procedure is scheduled for 10/24/15. She will pick up her letter of instructions and get labs tomorrow.

## 2015-10-18 NOTE — Addendum Note (Signed)
Addended by: Alvis Lemmings C on: 10/18/2015 05:55 PM   Modules accepted: Orders

## 2015-10-19 ENCOUNTER — Other Ambulatory Visit: Payer: Medicare Other

## 2015-10-19 ENCOUNTER — Telehealth: Payer: Self-pay | Admitting: Internal Medicine

## 2015-10-19 NOTE — Telephone Encounter (Signed)
Patient has 11:15 lab appt BiV ICD gen change out scheduled 9/6

## 2015-10-19 NOTE — Telephone Encounter (Signed)
New message   Pt verbalized that she is suppose to meet Nira Conn today when she is done with her labs to go over her instructions for the procedure

## 2015-10-19 NOTE — Telephone Encounter (Signed)
F/u Message  Pt call requesting to speak with RN about canceling Cath procedure schedule for 9/6. Please call back to discuss

## 2015-10-19 NOTE — Telephone Encounter (Signed)
Returned call to patient. She states she needs to cancel her BiV ICD gen change out for 9/6 as she has no transportation.   Aware that message will be sent to [facility], RN to contact patient next week about arranging procedure.   ERI date 10/10/15  Lab appt for 9/1 and procedure for 9/6 both cancelled.

## 2015-10-23 ENCOUNTER — Telehealth: Payer: Self-pay | Admitting: Internal Medicine

## 2015-10-23 NOTE — Telephone Encounter (Signed)
I spoke with the patient. She is aware her generator change was cancelled for 9/6 per her request.

## 2015-10-23 NOTE — Telephone Encounter (Signed)
Follow Up:   Pt just wanted to be sure her procedure is cancelled for tomorrow,Pt is still not feel well,unable to get out of the house.

## 2015-10-23 NOTE — Telephone Encounter (Signed)
I called and with the patient.  She is aware that her Generator change has been cancelled for tomorrow. She will call me back when she is ready to r/s.

## 2015-10-24 ENCOUNTER — Encounter (HOSPITAL_COMMUNITY): Admission: RE | Payer: Self-pay | Source: Ambulatory Visit

## 2015-10-24 ENCOUNTER — Encounter: Payer: Medicare Other | Admitting: Physician Assistant

## 2015-10-24 ENCOUNTER — Ambulatory Visit (HOSPITAL_COMMUNITY): Admission: RE | Admit: 2015-10-24 | Payer: Medicare Other | Source: Ambulatory Visit | Admitting: Internal Medicine

## 2015-10-24 SURGERY — ICD/BIV ICD GENERATOR CHANGEOUT
Anesthesia: LOCAL

## 2015-11-05 ENCOUNTER — Telehealth: Payer: Self-pay | Admitting: Internal Medicine

## 2015-11-05 DIAGNOSIS — I428 Other cardiomyopathies: Secondary | ICD-10-CM

## 2015-11-05 DIAGNOSIS — I442 Atrioventricular block, complete: Secondary | ICD-10-CM

## 2015-11-05 DIAGNOSIS — Z01812 Encounter for preprocedural laboratory examination: Secondary | ICD-10-CM

## 2015-11-05 NOTE — Telephone Encounter (Signed)
Pt requesting to know when her surgery has been scheduled.  Advised I will forward this information by Nira Conn who will c/b to reschedule generator change.

## 2015-11-05 NOTE — Telephone Encounter (Signed)
Michele [name] is calling in reference to a date on surgery . Please call    Thanks

## 2015-11-08 ENCOUNTER — Encounter: Payer: Self-pay | Admitting: *Deleted

## 2015-11-08 NOTE — Telephone Encounter (Signed)
I called and spoke with the patient. We have rescheduled her generator change for 11/21/15. A detailed letter of instructions will be mailed to her. She will come to the office on 11/14/15 for labs and to pick up her surgical scrub.  Mailing address confirmed.

## 2015-11-09 ENCOUNTER — Ambulatory Visit: Payer: Medicare Other

## 2015-11-09 ENCOUNTER — Other Ambulatory Visit: Payer: Self-pay | Admitting: Internal Medicine

## 2015-11-09 DIAGNOSIS — I501 Left ventricular failure: Secondary | ICD-10-CM

## 2015-11-09 DIAGNOSIS — I442 Atrioventricular block, complete: Secondary | ICD-10-CM

## 2015-11-09 DIAGNOSIS — I4821 Permanent atrial fibrillation: Secondary | ICD-10-CM

## 2015-11-12 ENCOUNTER — Telehealth: Payer: Self-pay | Admitting: Internal Medicine

## 2015-11-12 NOTE — Telephone Encounter (Signed)
New message    Pt has questions about her dental surgery on the 4th of October. Please call.

## 2015-11-13 NOTE — Telephone Encounter (Signed)
I called and spoke with the patient. She broke her tooth and is scheduled for a temporary to be placed on 10/2. She was inquiring if this is ok. Per Dr. Sharrell Ku to proceed with temporary tooth. The patient is aware.

## 2015-11-14 ENCOUNTER — Other Ambulatory Visit: Payer: Medicare Other | Admitting: *Deleted

## 2015-11-14 DIAGNOSIS — Z01812 Encounter for preprocedural laboratory examination: Secondary | ICD-10-CM

## 2015-11-14 DIAGNOSIS — I428 Other cardiomyopathies: Secondary | ICD-10-CM

## 2015-11-14 DIAGNOSIS — I442 Atrioventricular block, complete: Secondary | ICD-10-CM

## 2015-11-14 LAB — CBC WITH DIFFERENTIAL/PLATELET
BASOS ABS: 0 {cells}/uL (ref 0–200)
Basophils Relative: 0 %
EOS PCT: 1 %
Eosinophils Absolute: 58 cells/uL (ref 15–500)
HCT: 39.5 % (ref 35.0–45.0)
HEMOGLOBIN: 13 g/dL (ref 11.7–15.5)
LYMPHS ABS: 1798 {cells}/uL (ref 850–3900)
LYMPHS PCT: 31 %
MCH: 29.8 pg (ref 27.0–33.0)
MCHC: 32.9 g/dL (ref 32.0–36.0)
MCV: 90.6 fL (ref 80.0–100.0)
MONOS PCT: 8 %
MPV: 10 fL (ref 7.5–12.5)
Monocytes Absolute: 464 cells/uL (ref 200–950)
NEUTROS PCT: 60 %
Neutro Abs: 3480 cells/uL (ref 1500–7800)
PLATELETS: 186 10*3/uL (ref 140–400)
RBC: 4.36 MIL/uL (ref 3.80–5.10)
RDW: 14.4 % (ref 11.0–15.0)
WBC: 5.8 10*3/uL (ref 3.8–10.8)

## 2015-11-14 LAB — PROTIME-INR
INR: 3.5 — ABNORMAL HIGH
PROTHROMBIN TIME: 35.1 s — AB (ref 9.0–11.5)

## 2015-11-15 LAB — BASIC METABOLIC PANEL
BUN: 18 mg/dL (ref 7–25)
CALCIUM: 9.4 mg/dL (ref 8.6–10.4)
CO2: 24 mmol/L (ref 20–31)
CREATININE: 1.19 mg/dL — AB (ref 0.60–0.88)
Chloride: 104 mmol/L (ref 98–110)
Glucose, Bld: 125 mg/dL — ABNORMAL HIGH (ref 65–99)
POTASSIUM: 4 mmol/L (ref 3.5–5.3)
SODIUM: 140 mmol/L (ref 135–146)

## 2015-11-19 ENCOUNTER — Telehealth: Payer: Self-pay | Admitting: Internal Medicine

## 2015-11-19 NOTE — Telephone Encounter (Signed)
New message    Pt calling stating that Michele [name] sent her the wrong papers and she is suppose to be having surgery on Wednesday. Please call.

## 2015-11-19 NOTE — Telephone Encounter (Signed)
Reviewed Wednesday's procedure instructions with patient.  She received old instruction sheet last week and states she didn't pay attention till this weekend.  She is aware to arrive at 10:30a.m for procedure. Patient verbalized understanding and agreeable to plan.

## 2015-11-21 ENCOUNTER — Encounter (HOSPITAL_COMMUNITY)
Admission: RE | Disposition: A | Discharge status: Home / Self Care | Payer: Self-pay | Source: Ambulatory Visit | Attending: Internal Medicine

## 2015-11-21 ENCOUNTER — Ambulatory Visit (HOSPITAL_COMMUNITY)
Admission: RE | Admit: 2015-11-21 | Discharge: 2015-11-21 | Disposition: A | Discharge status: Home / Self Care | Payer: Medicare Other | Source: Ambulatory Visit | Attending: Internal Medicine | Admitting: Internal Medicine

## 2015-11-21 ENCOUNTER — Encounter (HOSPITAL_COMMUNITY): Payer: Self-pay | Admitting: *Deleted

## 2015-11-21 DIAGNOSIS — E039 Hypothyroidism, unspecified: Secondary | ICD-10-CM | POA: Diagnosis not present

## 2015-11-21 DIAGNOSIS — E785 Hyperlipidemia, unspecified: Secondary | ICD-10-CM | POA: Diagnosis not present

## 2015-11-21 DIAGNOSIS — Z8249 Family history of ischemic heart disease and other diseases of the circulatory system: Secondary | ICD-10-CM | POA: Diagnosis not present

## 2015-11-21 DIAGNOSIS — I4821 Permanent atrial fibrillation: Secondary | ICD-10-CM

## 2015-11-21 DIAGNOSIS — I442 Atrioventricular block, complete: Secondary | ICD-10-CM | POA: Diagnosis not present

## 2015-11-21 DIAGNOSIS — K5790 Diverticulosis of intestine, part unspecified, without perforation or abscess without bleeding: Secondary | ICD-10-CM | POA: Insufficient documentation

## 2015-11-21 DIAGNOSIS — I428 Other cardiomyopathies: Secondary | ICD-10-CM

## 2015-11-21 DIAGNOSIS — Z86010 Personal history of colonic polyps: Secondary | ICD-10-CM | POA: Insufficient documentation

## 2015-11-21 DIAGNOSIS — Z8719 Personal history of other diseases of the digestive system: Secondary | ICD-10-CM | POA: Diagnosis not present

## 2015-11-21 DIAGNOSIS — Z4502 Encounter for adjustment and management of automatic implantable cardiac defibrillator: Secondary | ICD-10-CM

## 2015-11-21 DIAGNOSIS — Z7901 Long term (current) use of anticoagulants: Secondary | ICD-10-CM | POA: Insufficient documentation

## 2015-11-21 DIAGNOSIS — I501 Left ventricular failure: Secondary | ICD-10-CM

## 2015-11-21 DIAGNOSIS — I509 Heart failure, unspecified: Secondary | ICD-10-CM | POA: Diagnosis not present

## 2015-11-21 HISTORY — PX: EP IMPLANTABLE DEVICE: SHX172B

## 2015-11-21 LAB — PROTIME-INR
INR: 1.37
PROTHROMBIN TIME: 17 s — AB (ref 11.4–15.2)

## 2015-11-21 LAB — SURGICAL PCR SCREEN
MRSA, PCR: NEGATIVE
STAPHYLOCOCCUS AUREUS: POSITIVE — AB

## 2015-11-21 SURGERY — ICD/BIV ICD GENERATOR CHANGEOUT

## 2015-11-21 MED ORDER — SODIUM CHLORIDE 0.9 % IR SOLN
Status: AC
  Filled 2015-11-21: qty 2

## 2015-11-21 MED ORDER — MUPIROCIN 2 % EX OINT
1.0000 "application " | TOPICAL_OINTMENT | Freq: Once | CUTANEOUS | Status: DC
  Filled 2015-11-21: qty 22

## 2015-11-21 MED ORDER — FENTANYL CITRATE (PF) 100 MCG/2ML IJ SOLN
INTRAMUSCULAR | Status: DC | PRN
  Administered 2015-11-21: 25 ug via INTRAVENOUS

## 2015-11-21 MED ORDER — ACETAMINOPHEN 325 MG PO TABS
325.0000 mg | ORAL_TABLET | ORAL | Status: DC | PRN
  Filled 2015-11-21: qty 2

## 2015-11-21 MED ORDER — LIDOCAINE HCL (PF) 1 % IJ SOLN
INTRAMUSCULAR | Status: AC
  Filled 2015-11-21: qty 60

## 2015-11-21 MED ORDER — SODIUM CHLORIDE 0.9 % IR SOLN
80.0000 mg | Status: AC
  Administered 2015-11-21: 80 mg
  Filled 2015-11-21: qty 2

## 2015-11-21 MED ORDER — VANCOMYCIN HCL IN DEXTROSE 1-5 GM/200ML-% IV SOLN
1000.0000 mg | INTRAVENOUS | Status: AC
  Administered 2015-11-21: 1000 mg via INTRAVENOUS
  Filled 2015-11-21: qty 200

## 2015-11-21 MED ORDER — MIDAZOLAM HCL 5 MG/5ML IJ SOLN
INTRAMUSCULAR | Status: AC
  Filled 2015-11-21: qty 5

## 2015-11-21 MED ORDER — FENTANYL CITRATE (PF) 100 MCG/2ML IJ SOLN
INTRAMUSCULAR | Status: AC
  Filled 2015-11-21: qty 2

## 2015-11-21 MED ORDER — SODIUM CHLORIDE 0.9 % IV SOLN
INTRAVENOUS | Status: DC
  Administered 2015-11-21: 11:00:00 via INTRAVENOUS

## 2015-11-21 MED ORDER — LIDOCAINE HCL (PF) 1 % IJ SOLN
INTRAMUSCULAR | Status: DC | PRN
  Administered 2015-11-21: 25 mL

## 2015-11-21 MED ORDER — MIDAZOLAM HCL 5 MG/5ML IJ SOLN
INTRAMUSCULAR | Status: DC | PRN
  Administered 2015-11-21 (×2): 1 mg via INTRAVENOUS

## 2015-11-21 MED ORDER — SODIUM CHLORIDE 0.9 % IV SOLN: INTRAVENOUS | Status: AC

## 2015-11-21 MED ORDER — ONDANSETRON HCL 4 MG/2ML IJ SOLN: 4.0000 mg | Freq: Four times a day (QID) | INTRAMUSCULAR | Status: DC | PRN

## 2015-11-21 MED ORDER — VANCOMYCIN HCL IN DEXTROSE 1-5 GM/200ML-% IV SOLN
INTRAVENOUS | Status: AC
  Filled 2015-11-21: qty 200

## 2015-11-21 MED ORDER — MUPIROCIN 2 % EX OINT
TOPICAL_OINTMENT | CUTANEOUS | Status: AC
  Administered 2015-11-21: 1
  Filled 2015-11-21: qty 22

## 2015-11-21 SURGICAL SUPPLY — 4 items
CABLE SURGICAL S-101-97-12 (CABLE) ×2 IMPLANT
ICD VIVA XT CRT-D DTBA1D1 (ICD Generator) ×2 IMPLANT
PAD DEFIB LIFELINK (PAD) ×2 IMPLANT
TRAY PACEMAKER INSERTION (PACKS) ×2 IMPLANT

## 2015-11-21 NOTE — Interval H&P Note (Signed)
ICD Criteria  Current LVEF:55%. Within 12 months prior to implant: Yes   Heart failure history: Yes, Class II  Cardiomyopathy history: Yes, Non-Ischemic Cardiomyopathy.  Atrial Fibrillation/Atrial Flutter: Yes, Paroxysmal.  Ventricular tachycardia history: No.  Cardiac arrest history: No.  History of syndromes with risk of sudden death: No.  Previous ICD: Yes, Reason for ICD:  Primary prevention.  Current ICD indication: Primary  PPM indication: Yes. Pacing type: Ventricular. Greater than 40% RV pacing requirement anticipated. Indication: Complete Heart Block   Class I or II Bradycardia indication present: Yes  Beta Blocker therapy for 3 or more months: No, medical reason.  Ace Inhibitor/ARB therapy for 3 or more months: No, medical reason.  History and Physical Interval Note:  11/21/2015 12:38 PM  Michele [name]  has presented today for surgery, with the diagnosis of hb - cm - eri  The various methods of treatment have been discussed with the patient and family. After consideration of risks, benefits and other options for treatment, the patient has consented to  Procedure(s): BIV ICD Generator Changeout (N/A) as a surgical intervention .  The patient's history has been reviewed, patient examined, no change in status, stable for surgery.  I have reviewed the patient's chart and labs.  Questions were answered to the patient's satisfaction.     Virl Axe

## 2015-11-21 NOTE — Discharge Instructions (Signed)

## 2015-11-21 NOTE — H&P (Signed)
Patient Care Team: Biagio Borg, MD as PCP - General (Internal Medicine)   HPI  Michele [name] is a 82 y.o. female Is admitted for generator replacement.  She has  atrial fibrillation s/p AV ablation with a now resolved tachycardia-induced cardiomyopathy following CRT-D implantation. That procedure was complicated by pericarditis.   Echo 2010 EF 55-60% improved from 25-30%  Echocardiogram 12/16 retained normal LV function  .  She has done quite well. The patient denies chest pain, shortness of breath, nocturnal dyspnea, orthopnea or peripheral edema.  There have been no palpitations, lightheadedness or syncope.   We have discussed replacement with CRTP vs CRT-D ;  She would like the latter     Past Medical History:  Diagnosis Date  . Atrial fibrillation ([place])    NEG STRESS CARDIOLITE  . C. difficile colitis   . Cardiomyopathy    RATE RELATED  . CHF (congestive heart failure) ([place])   . Diverticulitis    PMH of X 2  . Diverticulosis    DIVERTICULITIS  . Gilbert's syndrome   . Hx of colonic polyp 2011   Dr Fuller Plan  . Hyperlipidemia    NMR LIOPROFILE LDL 234(3206/2234)HDL 37,TG86  . Thyroid disease    HYPOTHYROIDISM...THYROID NODULES    Past Surgical History:  Procedure Laterality Date  . ABDOMINAL HYSTERECTOMY  1973  . APPENDECTOMY  1956  . BIOPSY THYROID     SINGLE NODULE  . BREAST BIOPSY  1997  . CARDIAC DEFIBRILLATOR PLACEMENT    . CHOLECYSTECTOMY  1972  . colonoscopy with polypectomy  2011  . ICD....02/2008    . LEAD REVISION N/A 08/21/2011   Procedure: LEAD REVISION;  Surgeon: Deboraha Sprang, MD;  Location: [facility] CATH LAB;  Service: Cardiovascular;  Laterality: N/A;  . PACEMAKER PLACEMENT     10/2006  . ROTATOR CUFF REPAIR     x2  . THROIDECTOMY 12/08     benign    Current Facility-Administered Medications  Medication Dose Route Frequency Provider Last Rate Last Dose  . 0.9 %  sodium chloride infusion   Intravenous Continuous Deboraha Sprang, MD       . gentamicin (GARAMYCIN) 80 mg in sodium chloride irrigation 0.9 % 500 mL irrigation  80 mg Irrigation To Cath Deboraha Sprang, MD      . mupirocin ointment (BACTROBAN) 2 %           . vancomycin (VANCOCIN) IVPB 1000 mg/200 mL premix  1,000 mg Intravenous To Cath Deboraha Sprang, MD        Allergies  Allergen Reactions  . Cephalexin Hives  . Erythromycin Swelling and Other (See Comments)    REACTION: swollen and sore mouth  . Terbinafine Hcl     Rash   . Colchicine Other (See Comments)    REACTION: violent diarrhea  . Rosuvastatin Other (See Comments)    REACTION: upper arm pain bilaterally   Family History  Problem Relation Age of Onset  . Heart attack Mother 25  . Heart attack Brother 13  . Lung disease Father     Silicosis  . Vasculitis Sister     Giant Cell Arteritis  . Ulcerative colitis Son    Social History  Substance Use Topics  . Smoking status: Never Smoker  . Smokeless tobacco: Not on file  . Alcohol use No   Current Meds  Medication Sig  . cholecalciferol (VITAMIN D) 1000 UNITS tablet Take 1,000 Units by mouth daily.  Marland Kitchen  co-enzyme Q-10 30 MG capsule Take 30 mg by mouth daily.  Marland Kitchen ezetimibe-simvastatin (VYTORIN) 10-20 MG tablet TAKE ONE TABLET AT BEDTIME. (Patient taking differently: Take 1 tablet by mouth at bedtime. TAKE ONE TABLET AT BEDTIME.)  . furosemide (LASIX) 20 MG tablet TAKE 1 TABLET ONCE DAILY AS NEEDED FOR FLUID.  Marland Kitchen levothyroxine (SYNTHROID) 112 MCG tablet Take 112 mcg by mouth See admin instructions. Takes 112 mcg Mondays, Wednesdays and Fridays and 56 mcg all other days of the week  . Multiple Vitamin (MULTIVITAMIN WITH MINERALS) TABS Take 1 tablet by mouth daily.  Marland Kitchen OVER THE COUNTER MEDICATION Place 1 drop into both eyes daily as needed (dry eyes).  . warfarin (COUMADIN) 5 MG tablet TAKE AS DIRECTED. (Patient taking differently: Take 2.5-5 mg by mouth See admin instructions. 5 mg daily except on fridays and takes 2.5 mg)     Review of Systems  negative except from HPI and PMH  Physical Exam BP 114/70   Pulse 88   Temp 97.7 F (36.5 C) (Oral)   Resp 18   Ht 5' 3.5" (1.613 m)   Wt 124 lb (56.2 kg)   SpO2 97%   BMI 21.62 kg/m  Well developed and well nourished in no acute distress HENT normal E scleral and icterus clear Neck Supple JVP flat; carotids brisk and full Clear to ausculation Device pocket well healed; without hematoma or erythema.  There is no tethering Regular rate and rhythm, no murmurs gallops or rub Soft with active bowel sounds No clubbing cyanosis  Edema Alert and oriented, grossly normal motor and sensory function Skin Warm and Dry    Assessment and  Plan  Atrial fibrillation-permanent  Complete heart block  S/p AV ablation  Nonischemic cardiomyopathy -resolved  Implantable defibrillator-biventricular-[employer]tronic   We have reviewed the benefits and risks of generator replacement.  These include but are not limited to lead fracture and infection.  The patient understands, agrees and is willing to proceed.

## 2015-11-22 ENCOUNTER — Encounter (HOSPITAL_COMMUNITY): Payer: Self-pay | Admitting: Internal Medicine

## 2015-11-29 ENCOUNTER — Other Ambulatory Visit: Payer: Self-pay | Admitting: Internal Medicine

## 2015-11-29 ENCOUNTER — Telehealth: Payer: Self-pay | Admitting: Emergency Medicine

## 2015-11-29 DIAGNOSIS — E890 Postprocedural hypothyroidism: Secondary | ICD-10-CM

## 2015-11-29 NOTE — Telephone Encounter (Signed)
error 

## 2015-12-03 ENCOUNTER — Ambulatory Visit (INDEPENDENT_AMBULATORY_CARE_PROVIDER_SITE_OTHER): Payer: Medicare Other | Admitting: *Deleted

## 2015-12-03 DIAGNOSIS — I428 Other cardiomyopathies: Secondary | ICD-10-CM

## 2015-12-03 LAB — CUP PACEART INCLINIC DEVICE CHECK
Battery Remaining Longevity: 86 mo
Battery Voltage: 3.03 V
Brady Statistic AS VP Percent: 99.95 %
Brady Statistic RA Percent Paced: 0 %
Date Time Interrogation Session: 20171016112916
HIGH POWER IMPEDANCE MEASURED VALUE: 78 Ohm
HighPow Impedance: 47 Ohm
Implantable Lead Implant Date: 20080908
Implantable Lead Implant Date: 20100121
Implantable Lead Location: 753858
Implantable Lead Location: 753859
Implantable Lead Location: 753860
Implantable Lead Model: 5076
Implantable Lead Model: 7120
Lead Channel Impedance Value: 342 Ohm
Lead Channel Impedance Value: 475 Ohm
Lead Channel Pacing Threshold Amplitude: 0.5 V
Lead Channel Pacing Threshold Pulse Width: 0.4 ms
Lead Channel Pacing Threshold Pulse Width: 0.8 ms
Lead Channel Setting Pacing Amplitude: 2 V
Lead Channel Setting Pacing Pulse Width: 0.4 ms
MDC IDC LEAD IMPLANT DT: 20100121
MDC IDC LEAD IMPLANT DT: 20130704
MDC IDC LEAD LOCATION: 753860
MDC IDC LEAD MODEL: 4196
MDC IDC MSMT LEADCHNL LV IMPEDANCE VALUE: 532 Ohm
MDC IDC MSMT LEADCHNL LV IMPEDANCE VALUE: 551 Ohm
MDC IDC MSMT LEADCHNL LV IMPEDANCE VALUE: 988 Ohm
MDC IDC MSMT LEADCHNL LV PACING THRESHOLD AMPLITUDE: 1.25 V
MDC IDC MSMT LEADCHNL RA IMPEDANCE VALUE: 551 Ohm
MDC IDC MSMT LEADCHNL RA SENSING INTR AMPL: 3.5 mV
MDC IDC SET LEADCHNL LV PACING AMPLITUDE: 2.25 V
MDC IDC SET LEADCHNL LV PACING PULSEWIDTH: 0.8 ms
MDC IDC SET LEADCHNL RV SENSING SENSITIVITY: 0.3 mV
MDC IDC STAT BRADY AP VP PERCENT: 0 %
MDC IDC STAT BRADY AP VS PERCENT: 0 %
MDC IDC STAT BRADY AS VS PERCENT: 0.05 %
MDC IDC STAT BRADY RV PERCENT PACED: 99.96 %

## 2015-12-03 NOTE — Patient Instructions (Signed)
Device Clinic: [phone]  Call with signs and symptoms of infection including redness, swelling , drainage, fever/chillls or if the site is hot to the touch.

## 2015-12-03 NOTE — Progress Notes (Signed)
Wound check appointment. Steri-strips removed. Wound without redness or edema. Right lateral incision un-approximated. No stitch found. Area closed internally-antibiotic ointment and Steri-strips applied. Normal device function. Thresholds, sensing, and impedances consistent with implant measurements. Device programmed at chronic values s/p gen change. Histogram distribution appropriate for patient and level of activity. 1 mode switche (100% burden) + coumadin. No ventricular arrhythmias noted. Patient educated about wound care and shock plan. ROV wtih d/c 10/20 for wound re-check and with SK 02/2016

## 2015-12-06 ENCOUNTER — Ambulatory Visit (INDEPENDENT_AMBULATORY_CARE_PROVIDER_SITE_OTHER): Payer: Medicare Other | Admitting: Internal Medicine

## 2015-12-06 ENCOUNTER — Ambulatory Visit (INDEPENDENT_AMBULATORY_CARE_PROVIDER_SITE_OTHER): Payer: Medicare Other | Admitting: General Practice

## 2015-12-06 VITALS — BP 118/64 | HR 87 | Temp 98.1°F | Resp 20 | Wt 129.2 lb

## 2015-12-06 DIAGNOSIS — E890 Postprocedural hypothyroidism: Secondary | ICD-10-CM | POA: Diagnosis not present

## 2015-12-06 DIAGNOSIS — Z0001 Encounter for general adult medical examination with abnormal findings: Secondary | ICD-10-CM

## 2015-12-06 DIAGNOSIS — E785 Hyperlipidemia, unspecified: Secondary | ICD-10-CM | POA: Diagnosis not present

## 2015-12-06 DIAGNOSIS — Z5181 Encounter for therapeutic drug level monitoring: Secondary | ICD-10-CM | POA: Diagnosis not present

## 2015-12-06 DIAGNOSIS — F411 Generalized anxiety disorder: Secondary | ICD-10-CM

## 2015-12-06 DIAGNOSIS — N289 Disorder of kidney and ureter, unspecified: Secondary | ICD-10-CM

## 2015-12-06 LAB — POCT INR: INR: 1.7

## 2015-12-06 MED ORDER — FUROSEMIDE 20 MG PO TABS: ORAL_TABLET | ORAL | 2 refills | Status: DC

## 2015-12-06 MED ORDER — EZETIMIBE-SIMVASTATIN 10-20 MG PO TABS: ORAL_TABLET | ORAL | 3 refills | Status: DC

## 2015-12-06 MED ORDER — LEVOTHYROXINE SODIUM 112 MCG PO TABS: ORAL_TABLET | ORAL | 3 refills | Status: DC

## 2015-12-06 MED ORDER — WARFARIN SODIUM 5 MG PO TABS: ORAL_TABLET | ORAL | 5 refills | Status: DC

## 2015-12-06 NOTE — Progress Notes (Signed)
Subjective:    Patient ID: Michele [name], female    DOB: [birth year], 82 y.o.   MRN: [id number]  HPI  Here to f/u; overall doing ok,  Pt denies chest pain, increasing sob or doe, wheezing, orthopnea, PND, increased LE swelling, palpitations, dizziness or syncope.  Pt denies new neurological symptoms such as new headache, or facial or extremity weakness or numbness.  Pt denies polydipsia, polyuria, or low sugar episode.   Pt denies new neurological symptoms such as new headache, or facial or extremity weakness or numbness.   Pt states overall good compliance with meds, mostly trying to follow appropriate diet, with wt overall stable,  but little exercise however. No change in recent history.  Denies worsening depressive symptoms, suicidal ideation, or panic; has ongoing anxiety, not increased recently. Denies hyper or hypo thyroid symptoms such as voice, skin or hair change. Wt Readings from Last 3 Encounters:  12/06/15 129 lb 4 oz (58.6 kg)  11/21/15 124 lb (56.2 kg)  09/17/15 132 lb 3.2 oz (60 kg)  S/p recent right upper broken tooth, for crown soon. INR  Low with generator replacement oct 4, pt back on coumadin since then, and for f/u INR today. Only takes lasix prn  Asks for hardcopy all rx so can comparison shop different pharmacies Past Medical History:  Diagnosis Date  . Atrial fibrillation ([place])    NEG STRESS CARDIOLITE  . C. difficile colitis   . Cardiomyopathy    RATE RELATED  . CHF (congestive heart failure) ([place])   . Diverticulitis    PMH of X 2  . Diverticulosis    DIVERTICULITIS  . Gilbert's syndrome   . Hx of colonic polyp 2011   Dr Fuller Plan  . Hyperlipidemia    NMR LIOPROFILE LDL 234(3206/2234)HDL 37,TG86  . Thyroid disease    HYPOTHYROIDISM...THYROID NODULES   Past Surgical History:  Procedure Laterality Date  . ABDOMINAL HYSTERECTOMY  1973  . APPENDECTOMY  1956  . BIOPSY THYROID     SINGLE NODULE  . BREAST BIOPSY  1997  . CARDIAC DEFIBRILLATOR PLACEMENT    .  CHOLECYSTECTOMY  1972  . colonoscopy with polypectomy  2011  . EP IMPLANTABLE DEVICE N/A 11/21/2015   Procedure: BIV ICD Generator Changeout;  Surgeon: Deboraha Sprang, MD;  Location: [place] CV LAB;  Service: Cardiovascular;  Laterality: N/A;  . ICD....02/2008    . LEAD REVISION N/A 08/21/2011   Procedure: LEAD REVISION;  Surgeon: Deboraha Sprang, MD;  Location: [facility] CATH LAB;  Service: Cardiovascular;  Laterality: N/A;  . PACEMAKER PLACEMENT     10/2006  . ROTATOR CUFF REPAIR     x2  . THROIDECTOMY 12/08     benign    reports that she has never smoked. She does not have any smokeless tobacco history on file. She reports that she does not drink alcohol or use drugs. family history includes Heart attack (age of onset: 84) in her brother; Heart attack (age of onset: 10) in her mother; Lung disease in her father; Ulcerative colitis in her son; Vasculitis in her sister. Allergies  Allergen Reactions  . Cephalexin Hives  . Erythromycin Swelling and Other (See Comments)    REACTION: swollen and sore mouth  . Terbinafine Hcl     Rash   . Colchicine Other (See Comments)    REACTION: violent diarrhea  . Rosuvastatin Other (See Comments)    REACTION: upper arm pain bilaterally   Current Outpatient Prescriptions on File Prior to Visit  Medication Sig Dispense Refill  . cholecalciferol (VITAMIN D) 1000 UNITS tablet Take 1,000 Units by mouth daily.    Marland Kitchen co-enzyme Q-10 30 MG capsule Take 30 mg by mouth daily.    . Multiple Vitamin (MULTIVITAMIN WITH MINERALS) TABS Take 1 tablet by mouth daily.    Marland Kitchen OVER THE COUNTER MEDICATION Place 1 drop into both eyes daily as needed (dry eyes).     No current facility-administered medications on file prior to visit.     Review of Systems  Constitutional: Negative for unusual diaphoresis or night sweats HENT: Negative for ear swelling or discharge Eyes: Negative for worsening visual haziness  Respiratory: Negative for choking and stridor.     Gastrointestinal: Negative for distension or worsening eructation Genitourinary: Negative for retention or change in urine volume.  Musculoskeletal: Negative for other MSK pain or swelling Skin: Negative for color change and worsening wound Neurological: Negative for tremors and numbness other than noted  Psychiatric/Behavioral: Negative for decreased concentration or agitation other than above       Objective:   Physical Exam BP 118/64   Pulse 87   Temp 98.1 F (36.7 C) (Oral)   Resp 20   Wt 129 lb 4 oz (58.6 kg)   SpO2 98%   BMI 22.54 kg/m  VS noted,  Constitutional: Pt appears in no apparent distress HENT: Head: NCAT.  Right Ear: External ear normal.  Left Ear: External ear normal.  Eyes: . Pupils are equal, round, and reactive to light. Conjunctivae and EOM are normal Neck: Normal range of motion. Neck supple.  Cardiovascular: Normal rate and regular rhythm.   Pulmonary/Chest: Effort normal and breath sounds without rales or wheezing.  Neurological: Pt is alert. Not confused , motor grossly intact Skin: Skin is warm. No rash, no LE edema Psychiatric: Pt behavior is normal. No agitation. mild nervous No other significant findings    Assessment & Plan:

## 2015-12-06 NOTE — Patient Instructions (Addendum)

## 2015-12-06 NOTE — Progress Notes (Signed)
I have reviewed and agree with the plan. 

## 2015-12-06 NOTE — Progress Notes (Signed)
Pre visit review using our clinic review tool, if applicable. No additional management support is needed unless otherwise documented below in the visit note. 

## 2015-12-07 ENCOUNTER — Other Ambulatory Visit: Payer: Self-pay | Admitting: Internal Medicine

## 2015-12-07 ENCOUNTER — Ambulatory Visit (INDEPENDENT_AMBULATORY_CARE_PROVIDER_SITE_OTHER): Payer: Medicare Other | Admitting: *Deleted

## 2015-12-07 ENCOUNTER — Other Ambulatory Visit (INDEPENDENT_AMBULATORY_CARE_PROVIDER_SITE_OTHER): Payer: Medicare Other

## 2015-12-07 DIAGNOSIS — N289 Disorder of kidney and ureter, unspecified: Secondary | ICD-10-CM

## 2015-12-07 DIAGNOSIS — E785 Hyperlipidemia, unspecified: Secondary | ICD-10-CM | POA: Diagnosis not present

## 2015-12-07 DIAGNOSIS — Z0001 Encounter for general adult medical examination with abnormal findings: Secondary | ICD-10-CM | POA: Diagnosis not present

## 2015-12-07 DIAGNOSIS — E890 Postprocedural hypothyroidism: Secondary | ICD-10-CM | POA: Diagnosis not present

## 2015-12-07 DIAGNOSIS — Z950 Presence of cardiac pacemaker: Secondary | ICD-10-CM

## 2015-12-07 LAB — BASIC METABOLIC PANEL
BUN: 26 mg/dL — ABNORMAL HIGH (ref 6–23)
CHLORIDE: 102 meq/L (ref 96–112)
CO2: 29 mEq/L (ref 19–32)
Calcium: 9.5 mg/dL (ref 8.4–10.5)
Creatinine, Ser: 1.42 mg/dL — ABNORMAL HIGH (ref 0.40–1.20)
GFR: 37.44 mL/min — AB (ref >60.00)
GLUCOSE: 97 mg/dL (ref 70–99)
POTASSIUM: 4.3 meq/L (ref 3.5–5.1)
SODIUM: 138 meq/L (ref 135–145)

## 2015-12-07 LAB — HEPATIC FUNCTION PANEL
ALBUMIN: 4.2 g/dL (ref 3.5–5.2)
ALK PHOS: 54 U/L (ref 39–117)
ALT: 18 U/L (ref 0–35)
AST: 25 U/L (ref 0–37)
BILIRUBIN TOTAL: 1.3 mg/dL — AB (ref 0.2–1.2)
Bilirubin, Direct: 0.2 mg/dL (ref 0.0–0.3)
Total Protein: 7.3 g/dL (ref 6.0–8.3)

## 2015-12-07 LAB — URINALYSIS, ROUTINE W REFLEX MICROSCOPIC
Bilirubin Urine: NEGATIVE
Ketones, ur: NEGATIVE
Leukocytes, UA: NEGATIVE
Nitrite: NEGATIVE
PH: 6 (ref 5.0–8.0)
SPECIFIC GRAVITY, URINE: 1.01 (ref 1.000–1.030)
TOTAL PROTEIN, URINE-UPE24: NEGATIVE
Urine Glucose: NEGATIVE
Urobilinogen, UA: 0.2 (ref 0.0–1.0)

## 2015-12-07 LAB — CBC WITH DIFFERENTIAL/PLATELET
BASOS PCT: 0.4 % (ref 0.0–3.0)
Basophils Absolute: 0 10*3/uL (ref 0.0–0.1)
EOS ABS: 0.1 10*3/uL (ref 0.0–0.7)
EOS PCT: 0.9 % (ref 0.0–5.0)
HCT: 39 % (ref 36.0–46.0)
Hemoglobin: 13.5 g/dL (ref 12.0–15.0)
LYMPHS ABS: 1.6 10*3/uL (ref 0.7–4.0)
Lymphocytes Relative: 25.5 % (ref 12.0–46.0)
MCHC: 34.6 g/dL (ref 30.0–36.0)
MCV: 87.8 fl (ref 78.0–100.0)
MONO ABS: 0.5 10*3/uL (ref 0.1–1.0)
Monocytes Relative: 7.9 % (ref 3.0–12.0)
NEUTROS PCT: 65.3 % (ref 43.0–77.0)
Neutro Abs: 4 10*3/uL (ref 1.4–7.7)
Platelets: 219 10*3/uL (ref 150.0–400.0)
RBC: 4.44 Mil/uL (ref 3.87–5.11)
RDW: 13.9 % (ref 11.5–15.5)
WBC: 6.1 10*3/uL (ref 4.0–10.5)

## 2015-12-07 LAB — LIPID PANEL
CHOLESTEROL: 128 mg/dL (ref 0–200)
HDL: 45.2 mg/dL (ref >39.00)
LDL CALC: 63 mg/dL (ref 0–99)
NonHDL: 83.26
TRIGLYCERIDES: 103 mg/dL (ref 0.0–149.0)
Total CHOL/HDL Ratio: 3
VLDL: 20.6 mg/dL (ref 0.0–40.0)

## 2015-12-07 LAB — TSH: TSH: 6.45 u[IU]/mL — AB (ref 0.35–4.50)

## 2015-12-07 NOTE — Progress Notes (Signed)
Wound re-check- steri strips removed by patient prior to appt. Right lateral incision closed. Incision free of redness, swelling or drainage. I instructed patient on wound care and to call the DC with s/s of infection. Patient verbalized understanding. OV with SK 1/17

## 2015-12-09 ENCOUNTER — Encounter: Payer: Self-pay | Admitting: Internal Medicine

## 2015-12-09 DIAGNOSIS — N183 Chronic kidney disease, stage 3 unspecified: Secondary | ICD-10-CM | POA: Insufficient documentation

## 2015-12-09 DIAGNOSIS — N289 Disorder of kidney and ureter, unspecified: Secondary | ICD-10-CM

## 2015-12-09 HISTORY — DX: Disorder of kidney and ureter, unspecified: N28.9

## 2015-12-09 NOTE — Assessment & Plan Note (Addendum)
stable overall by history and exam, recent data reviewed with pt, and pt to continue medical treatment as before,  to f/u any worsening symptoms or concerns Lab Results  Component Value Date   WBC 6.1 12/07/2015   HGB 13.5 12/07/2015   HCT 39.0 12/07/2015   PLT 219.0 12/07/2015   GLUCOSE 97 12/07/2015   CHOL 128 12/07/2015   TRIG 103.0 12/07/2015   HDL 45.20 12/07/2015   LDLDIRECT 163.7 06/23/2011   LDLCALC 63 12/07/2015   ALT 18 12/07/2015   AST 25 12/07/2015   NA 138 12/07/2015   K 4.3 12/07/2015   CL 102 12/07/2015   CREATININE 1.42 (H) 12/07/2015   BUN 26 (H) 12/07/2015   CO2 29 12/07/2015   TSH 6.45 (H) 12/07/2015   INR 1.7 12/06/2015   HGBA1C 5.6 08/27/2007

## 2015-12-09 NOTE — Assessment & Plan Note (Signed)
stable overall by history and exam, recent data reviewed with pt, and pt to continue medical treatment as before,  to f/u any worsening symptoms or concerns Lab Results  Component Value Date   TSH 6.45 (H) 12/07/2015

## 2015-12-09 NOTE — Assessment & Plan Note (Signed)
Mild, recent worse, for f/u lab in 3 days, cont current tx, only takes lasix 20 prn, volume ok today, to avoid nephrotoxic meds

## 2015-12-09 NOTE — Assessment & Plan Note (Signed)
stable overall by history and exam, recent data reviewed with pt, and pt to continue medical treatment as before,  to f/u any worsening symptoms or concerns Lab Results  Component Value Date   LDLCALC 63 12/07/2015

## 2015-12-13 ENCOUNTER — Ambulatory Visit (INDEPENDENT_AMBULATORY_CARE_PROVIDER_SITE_OTHER): Payer: Medicare Other | Admitting: Internal Medicine

## 2015-12-13 ENCOUNTER — Other Ambulatory Visit (INDEPENDENT_AMBULATORY_CARE_PROVIDER_SITE_OTHER): Payer: Medicare Other

## 2015-12-13 VITALS — BP 118/74 | HR 78 | Ht 64.0 in | Wt 130.0 lb

## 2015-12-13 DIAGNOSIS — R339 Retention of urine, unspecified: Secondary | ICD-10-CM

## 2015-12-13 DIAGNOSIS — F411 Generalized anxiety disorder: Secondary | ICD-10-CM | POA: Diagnosis not present

## 2015-12-13 DIAGNOSIS — N289 Disorder of kidney and ureter, unspecified: Secondary | ICD-10-CM

## 2015-12-13 DIAGNOSIS — N179 Acute kidney failure, unspecified: Secondary | ICD-10-CM | POA: Diagnosis not present

## 2015-12-13 NOTE — Progress Notes (Signed)
Subjective:    Patient ID: Michele [name], female    DOB: 28-Dec-1929, 82 y.o.   MRN: [id number]  HPI  Here to f/u, had been called at home by staff to have renal panel repeated due to mild AKI, but she wanted an OV to further discuss, as she has bladder pain/pelvic/low abd pain/retuention ongoing for > 1 yr, mild occurring just prior to urination, then some after urination without dysuria, frequency, urgency, flank pain, hematuria or n/v, fever, chils.  Has seen 4 urologists over 4 visits in the past year (no record of visits apparently forwarded for this EMR), last seen per Dr Vikki Ports, recommended for flomax but she was not convinced by her exams for some reason that she needed to take the med, so has never started, and for some reason she is vague, still does not want to take now.  Not willing to consider other med as well.  Last CT abd/pelvis July 2016 without acute, no hydronephrosis.  Drinks plenty of fluids which may tend to exacerbate her bladder issue. Pt denies chest pain, increased sob or doe, wheezing, orthopnea, PND, increased LE swelling, palpitations, dizziness or syncope.  Pt denies new neurological symptoms such as new headache, or facial or extremity weakness or numbness She is very concerned today about her risk for renal cell ca as her mother was diagnosed with same at her age (but died of other soon after diagnosis)  Denies worsening depressive symptoms, suicidal ideation, or panic; has ongoing anxiety, stable per pt Past Medical History:  Diagnosis Date  . Atrial fibrillation ([place])    NEG STRESS CARDIOLITE  . C. difficile colitis   . Cardiomyopathy    RATE RELATED  . CHF (congestive heart failure) ([place])   . Diverticulitis    PMH of X 2  . Diverticulosis    DIVERTICULITIS  . Gilbert's syndrome   . Hx of colonic polyp 2011   Dr Fuller Plan  . Hyperlipidemia    NMR LIOPROFILE LDL 234(3206/2234)HDL 37,TG86  . Renal insufficiency 12/09/2015  . Thyroid disease    HYPOTHYROIDISM...THYROID NODULES   Past Surgical History:  Procedure Laterality Date  . ABDOMINAL HYSTERECTOMY  1973  . APPENDECTOMY  1956  . BIOPSY THYROID     SINGLE NODULE  . BREAST BIOPSY  1997  . CARDIAC DEFIBRILLATOR PLACEMENT    . CHOLECYSTECTOMY  1972  . colonoscopy with polypectomy  2011  . EP IMPLANTABLE DEVICE N/A 11/21/2015   Procedure: BIV ICD Generator Changeout;  Surgeon: Deboraha Sprang, MD;  Location: [place] CV LAB;  Service: Cardiovascular;  Laterality: N/A;  . ICD....02/2008    . LEAD REVISION N/A 08/21/2011   Procedure: LEAD REVISION;  Surgeon: Deboraha Sprang, MD;  Location: [facility] CATH LAB;  Service: Cardiovascular;  Laterality: N/A;  . PACEMAKER PLACEMENT     10/2006  . ROTATOR CUFF REPAIR     x2  . THROIDECTOMY 12/08     benign    reports that she has never smoked. She does not have any smokeless tobacco history on file. She reports that she does not drink alcohol or use drugs. family history includes Heart attack (age of onset: 15) in her brother; Heart attack (age of onset: 65) in her mother; Lung disease in her father; Ulcerative colitis in her son; Vasculitis in her sister. Allergies  Allergen Reactions  . Cephalexin Hives  . Erythromycin Swelling and Other (See Comments)    REACTION: swollen and sore mouth  . Terbinafine Hcl  Rash   . Colchicine Other (See Comments)    REACTION: violent diarrhea  . Rosuvastatin Other (See Comments)    REACTION: upper arm pain bilaterally   Current Outpatient Prescriptions on File Prior to Visit  Medication Sig Dispense Refill  . cholecalciferol (VITAMIN D) 1000 UNITS tablet Take 1,000 Units by mouth daily.    Marland Kitchen co-enzyme Q-10 30 MG capsule Take 30 mg by mouth daily.    Marland Kitchen ezetimibe-simvastatin (VYTORIN) 10-20 MG tablet TAKE ONE TABLET AT BEDTIME. 90 tablet 3  . furosemide (LASIX) 20 MG tablet TAKE 1 TABLET ONCE DAILY AS NEEDED FOR FLUID. 30 tablet 2  . levothyroxine (SYNTHROID) 112 MCG tablet TAKE 1 TABLET DAILY  ON MONDAY, WEDNESDAY AND FRIDAY AND 1/2 TABLET ONOTHER DAYS 84 tablet 3  . Multiple Vitamin (MULTIVITAMIN WITH MINERALS) TABS Take 1 tablet by mouth daily.    Marland Kitchen OVER THE COUNTER MEDICATION Place 1 drop into both eyes daily as needed (dry eyes).    . warfarin (COUMADIN) 5 MG tablet TAKE AS DIRECTED. 30 tablet 5   No current facility-administered medications on file prior to visit.    Review of Systems  Constitutional: Negative for unusual diaphoresis or night sweats HENT: Negative for ear swelling or discharge Eyes: Negative for worsening visual haziness  Respiratory: Negative for choking and stridor.   Gastrointestinal: Negative for distension or worsening eructation Genitourinary: Negative for retention or change in urine volume.  Musculoskeletal: Negative for other MSK pain or swelling Skin: Negative for color change and worsening wound Neurological: Negative for tremors and numbness other than noted  Psychiatric/Behavioral: Negative for decreased concentration or agitation other than above   Pt denies all o/w neg    Objective:   Physical Exam BP 118/74   Pulse 78   Ht 5\' 4"  (1.626 m)   Wt 130 lb (59 kg)   SpO2 98%   BMI 22.31 kg/m  VS noted,  Constitutional: Pt appears in no apparent distress HENT: Head: NCAT.  Right Ear: External ear normal.  Left Ear: External ear normal.  Eyes: . Pupils are equal, round, and reactive to light. Conjunctivae and EOM are normal Neck: Normal range of motion. Neck supple.  Cardiovascular: Normal rate and regular rhythm.   Pulmonary/Chest: Effort normal and breath sounds without rales or wheezing.  Abd:  Soft, NT, ND, + BS - benign exam Neurological: Pt is alert. Not confused , motor grossly intact Skin: Skin is warm. No rash, no LE edema Psychiatric: Pt behavior is normal. No agitation.     Assessment & Plan:

## 2015-12-13 NOTE — Assessment & Plan Note (Signed)
Chronic recurrent, but simply not interested in trial flomax despite her ongoing pain

## 2015-12-13 NOTE — Assessment & Plan Note (Signed)
Mild persistent, may be a factor in her non compliance with tx efforts, cont same tx for now,  to f/u any worsening symptoms or concerns

## 2015-12-13 NOTE — Patient Instructions (Signed)

## 2015-12-13 NOTE — Progress Notes (Signed)
Pre visit review using our clinic review tool, if applicable. No additional management support is needed unless otherwise documented below in the visit note. 

## 2015-12-13 NOTE — Assessment & Plan Note (Signed)
Mild, etiology unclear, doubt volume related, will simply check BMP repeat, consider renal u/s

## 2015-12-14 LAB — BASIC METABOLIC PANEL
BUN: 24 mg/dL — ABNORMAL HIGH (ref 6–23)
CHLORIDE: 102 meq/L (ref 96–112)
CO2: 27 meq/L (ref 19–32)
Calcium: 10 mg/dL (ref 8.4–10.5)
Creatinine, Ser: 1.22 mg/dL — ABNORMAL HIGH (ref 0.40–1.20)
GFR: 44.6 mL/min — ABNORMAL LOW (ref >60.00)
Glucose, Bld: 96 mg/dL (ref 70–99)
POTASSIUM: 4.9 meq/L (ref 3.5–5.1)
SODIUM: 136 meq/L (ref 135–145)

## 2016-01-04 ENCOUNTER — Ambulatory Visit (INDEPENDENT_AMBULATORY_CARE_PROVIDER_SITE_OTHER): Payer: Medicare Other | Admitting: General Practice

## 2016-01-04 DIAGNOSIS — Z5181 Encounter for therapeutic drug level monitoring: Secondary | ICD-10-CM | POA: Diagnosis not present

## 2016-01-04 LAB — POCT INR: INR: 1.9

## 2016-01-04 NOTE — Patient Instructions (Signed)
Pre visit review using our clinic review tool, if applicable. No additional management support is needed unless otherwise documented below in the visit note. 

## 2016-01-04 NOTE — Progress Notes (Signed)
I have reviewed and agree with the plan. 

## 2016-01-07 ENCOUNTER — Other Ambulatory Visit: Payer: Self-pay | Admitting: General Practice

## 2016-01-07 ENCOUNTER — Telehealth: Payer: Self-pay | Admitting: Emergency Medicine

## 2016-01-07 DIAGNOSIS — E890 Postprocedural hypothyroidism: Secondary | ICD-10-CM

## 2016-01-07 MED ORDER — FUROSEMIDE 20 MG PO TABS: 20.0000 mg | ORAL_TABLET | ORAL | 1 refills | Status: DC | PRN

## 2016-01-07 MED ORDER — LEVOTHYROXINE SODIUM 112 MCG PO TABS: ORAL_TABLET | ORAL | 3 refills | Status: DC

## 2016-01-07 MED ORDER — EZETIMIBE-SIMVASTATIN 10-20 MG PO TABS: 1.0000 | ORAL_TABLET | Freq: Every day | ORAL | 3 refills | Status: DC

## 2016-01-07 MED ORDER — WARFARIN SODIUM 5 MG PO TABS: ORAL_TABLET | ORAL | 3 refills | Status: DC

## 2016-01-07 NOTE — Addendum Note (Signed)
Addended by: Aviva Signs M on: 01/07/2016 02:51 PM   Modules accepted: Orders

## 2016-01-07 NOTE — Telephone Encounter (Signed)
Pt called and stated when she came in for her appt 12/06/15 she got a list of her medications when she checked in. It list that her ezetimibe-simvastatin (VYTORIN) 10-20 MG tablet, levothyroxine (SYNTHROID) 112 MCG tablet and warfarin (COUMADIN) 5 MG tablet have all been discontinued. I explained to her she probably just needed those medications refilled at her appt. All 3 medications were refilled on 12/06/15. She states they werent refilled on that date and wants to know why the paper says that they are discontinued when she is suppose to be taking these medications.  She asked to speak to a nurse. Since CMA and MD are out of the office I thought maybe you can give her a call back and explain this to her please. Thanks.

## 2016-01-07 NOTE — Telephone Encounter (Signed)
Please advise on the refill on coumadin.

## 2016-02-01 ENCOUNTER — Ambulatory Visit (INDEPENDENT_AMBULATORY_CARE_PROVIDER_SITE_OTHER): Payer: Medicare Other | Admitting: General Practice

## 2016-02-01 DIAGNOSIS — Z5181 Encounter for therapeutic drug level monitoring: Secondary | ICD-10-CM

## 2016-02-01 LAB — POCT INR: INR: 2.3

## 2016-02-01 NOTE — Patient Instructions (Signed)
Pre visit review using our clinic review tool, if applicable. No additional management support is needed unless otherwise documented below in the visit note. 

## 2016-02-03 NOTE — Progress Notes (Signed)
I have reviewed and agree with the plan. 

## 2016-02-29 ENCOUNTER — Ambulatory Visit (INDEPENDENT_AMBULATORY_CARE_PROVIDER_SITE_OTHER): Payer: Medicare Other | Admitting: General Practice

## 2016-02-29 DIAGNOSIS — Z5181 Encounter for therapeutic drug level monitoring: Secondary | ICD-10-CM | POA: Diagnosis not present

## 2016-02-29 LAB — POCT INR: INR: 2.9

## 2016-02-29 NOTE — Patient Instructions (Signed)
Pre visit review using our clinic review tool, if applicable. No additional management support is needed unless otherwise documented below in the visit note. 

## 2016-03-01 NOTE — Progress Notes (Signed)
I have reviewed and agree with the plan. 

## 2016-03-05 ENCOUNTER — Encounter: Payer: Medicare Other | Admitting: Internal Medicine

## 2016-03-28 ENCOUNTER — Ambulatory Visit (INDEPENDENT_AMBULATORY_CARE_PROVIDER_SITE_OTHER): Payer: Medicare Other | Admitting: General Practice

## 2016-03-28 DIAGNOSIS — Z5181 Encounter for therapeutic drug level monitoring: Secondary | ICD-10-CM

## 2016-03-28 LAB — POCT INR: INR: 2.6

## 2016-03-28 NOTE — Progress Notes (Signed)
I have reviewed and agree with the plan. 

## 2016-03-28 NOTE — Patient Instructions (Signed)
Pre visit review using our clinic review tool, if applicable. No additional management support is needed unless otherwise documented below in the visit note. 

## 2016-04-01 ENCOUNTER — Ambulatory Visit (HOSPITAL_COMMUNITY)
Admission: EM | Admit: 2016-04-01 | Discharge: 2016-04-01 | Disposition: A | Discharge status: Home / Self Care | Payer: Medicare Other | Attending: Family Medicine | Admitting: Family Medicine

## 2016-04-01 ENCOUNTER — Encounter (HOSPITAL_COMMUNITY): Payer: Self-pay | Admitting: Emergency Medicine

## 2016-04-01 ENCOUNTER — Ambulatory Visit (INDEPENDENT_AMBULATORY_CARE_PROVIDER_SITE_OTHER): Payer: Medicare Other

## 2016-04-01 DIAGNOSIS — R059 Cough, unspecified: Secondary | ICD-10-CM

## 2016-04-01 DIAGNOSIS — R05 Cough: Secondary | ICD-10-CM

## 2016-04-01 DIAGNOSIS — R042 Hemoptysis: Secondary | ICD-10-CM

## 2016-04-01 DIAGNOSIS — Z9229 Personal history of other drug therapy: Secondary | ICD-10-CM

## 2016-04-01 MED ORDER — BENZONATATE 100 MG PO CAPS: 100.0000 mg | ORAL_CAPSULE | Freq: Three times a day (TID) | ORAL | 0 refills | Status: DC

## 2016-04-01 NOTE — ED Provider Notes (Signed)
CSN: [id number]     Arrival date & time 04/01/16  1726 History   First MD Initiated Contact with Patient 04/01/16 1800     Chief Complaint  Patient presents with  . Cough    "coughing up blood"  . Headache   (Consider location/radiation/quality/duration/timing/severity/associated sxs/prior Treatment) Patient c/o cough and hemoptysis.  She is on coumadin.  She states she coughed up 2 large blood clots yesterday.   The history is provided by the patient.  Cough  Cough characteristics:  Productive Sputum characteristics:  Bloody Severity:  Mild Onset quality:  Sudden Duration:  2 days Timing:  Intermittent Progression:  Improving Chronicity:  New Smoker: no   Relieved by:  None tried Worsened by:  Nothing Ineffective treatments:  None tried Associated symptoms: headaches   Headache  Associated symptoms: cough     Past Medical History:  Diagnosis Date  . Atrial fibrillation ([place])    NEG STRESS CARDIOLITE  . C. difficile colitis   . Cardiomyopathy    RATE RELATED  . CHF (congestive heart failure) ([place])   . Diverticulitis    PMH of X 2  . Diverticulosis    DIVERTICULITIS  . Gilbert's syndrome   . Hx of colonic polyp 2011   Dr Fuller Plan  . Hyperlipidemia    NMR LIOPROFILE LDL 234(3206/2234)HDL 37,TG86  . Renal insufficiency 12/09/2015  . Thyroid disease    HYPOTHYROIDISM...THYROID NODULES   Past Surgical History:  Procedure Laterality Date  . ABDOMINAL HYSTERECTOMY  1973  . APPENDECTOMY  1956  . BIOPSY THYROID     SINGLE NODULE  . BREAST BIOPSY  1997  . CARDIAC DEFIBRILLATOR PLACEMENT    . CHOLECYSTECTOMY  1972  . colonoscopy with polypectomy  2011  . EP IMPLANTABLE DEVICE N/A 11/21/2015   Procedure: BIV ICD Generator Changeout;  Surgeon: Deboraha Sprang, MD;  Location: [facility];  Service: Cardiovascular;  Laterality: N/A;  . ICD....02/2008    . LEAD REVISION N/A 08/21/2011   Procedure: LEAD REVISION;  Surgeon: Deboraha Sprang, MD;  Location: [facility] CATH LAB;   Service: Cardiovascular;  Laterality: N/A;  . PACEMAKER PLACEMENT     10/2006  . ROTATOR CUFF REPAIR     x2  . THROIDECTOMY 12/08     benign   Family History  Problem Relation Age of Onset  . Heart attack Mother 57  . Heart attack Brother 85  . Lung disease Father     Silicosis  . Vasculitis Sister     Giant Cell Arteritis  . Ulcerative colitis Son    Social History  Substance Use Topics  . Smoking status: Never Smoker  . Smokeless tobacco: Never Used  . Alcohol use No   OB History    No data available     Review of Systems  Constitutional: Negative.   HENT: Negative.   Eyes: Negative.   Respiratory: Positive for cough.   Cardiovascular: Negative.   Gastrointestinal: Negative.   Endocrine: Negative.   Musculoskeletal: Negative.   Allergic/Immunologic: Negative.   Neurological: Positive for headaches.  Hematological: Negative.     Allergies  Cephalexin; Erythromycin; Terbinafine hcl; Colchicine; and Rosuvastatin  Home Medications   Prior to Admission medications   Medication Sig Start Date End Date Taking? Authorizing Provider  benzonatate (TESSALON) 100 MG capsule Take 1 capsule (100 mg total) by mouth every 8 (eight) hours. 04/01/16   Lysbeth Penner, FNP  cholecalciferol (VITAMIN D) 1000 UNITS tablet Take 1,000 Units by mouth daily.  Historical Provider, MD  co-enzyme Q-10 30 MG capsule Take 30 mg by mouth daily.    Historical Provider, MD  ezetimibe-simvastatin (VYTORIN) 10-20 MG tablet Take 1 tablet by mouth at bedtime. 01/07/16   Biagio Borg, MD  furosemide (LASIX) 20 MG tablet Take 1 tablet (20 mg total) by mouth as needed. 01/07/16   Biagio Borg, MD  levothyroxine (SYNTHROID) 112 MCG tablet TAKE 1 TABLET DAILY ON MONDAY, [facility] AND FRIDAY AND 1/2 TABLET ONOTHER DAYS 01/07/16   Biagio Borg, MD  Multiple Vitamin (MULTIVITAMIN WITH MINERALS) TABS Take 1 tablet by mouth daily.    Historical Provider, MD  OVER THE COUNTER MEDICATION Place 1 drop into  both eyes daily as needed (dry eyes).    Historical Provider, MD  warfarin (COUMADIN) 5 MG tablet TAKE AS DIRECTED. 01/07/16   Biagio Borg, MD   Meds Ordered and Administered this Visit  Medications - No data to display  BP 105/70 (BP Location: Right Arm)   Pulse 73   Temp 97.3 F (36.3 C) (Oral)   Resp 17   Ht 5' 3.5" (1.613 m)   Wt 127 lb (57.6 kg)   SpO2 98%   BMI 22.14 kg/m  No data found.   Physical Exam  Constitutional: She appears well-developed and well-nourished.  HENT:  Head: Normocephalic and atraumatic.  Right Ear: External ear normal.  Left Ear: External ear normal.  Mouth/Throat: Oropharynx is clear and moist.  Eyes: Conjunctivae and EOM are normal. Pupils are equal, round, and reactive to light.  Neck: Normal range of motion. Neck supple.  Cardiovascular: Normal rate, regular rhythm and normal heart sounds.   Pulmonary/Chest: Effort normal and breath sounds normal.  Abdominal: Soft. Bowel sounds are normal.  Nursing note and vitals reviewed.   Urgent Care Course     Procedures (including critical care time)  Labs Review Labs Reviewed - No data to display  Imaging Review Dg Chest 2 View  Result Date: 04/01/2016 CLINICAL DATA:  Acute onset of hemoptysis. Patient on Coumadin. Dizziness and tachycardia. Initial encounter. EXAM: CHEST  2 VIEW COMPARISON:  Chest radiograph performed 08/22/2011 FINDINGS: The lungs are well-aerated and clear. There is no evidence of focal opacification, pleural effusion or pneumothorax. The heart is borderline enlarged. A pacemaker/AICD is noted at the left chest wall, with leads ending at the right atrium, right ventricle and coronary sinus. No acute osseous abnormalities are seen. Postoperative change is noted about the thyroid bed. IMPRESSION: Borderline cardiomegaly.  Lungs remain grossly clear. Electronically Signed   By: Garald Balding M.D.   On: 04/01/2016 19:09     Visual Acuity Review  Right Eye Distance:   Left  Eye Distance:   Bilateral Distance:    Right Eye Near:   Left Eye Near:    Bilateral Near:         MDM   1. Cough   2. Hemoptysis   3. History of Coumadin therapy    CXR is normal Tessalon Perles 100mg  one po tid prn #21  Reassurance     Lysbeth Penner, FNP 04/01/16 [phone]

## 2016-04-01 NOTE — Discharge Instructions (Signed)
Your Michele [name] Blood is theapeutic and there is normal xray.  Would at this time reassure no lung mass is seen and bleeding is probably result of cough and delayed clotting time.  Would treat with cough medicine and rest and should resolve.

## 2016-04-01 NOTE — ED Triage Notes (Signed)
Pt. Stated, I've been coughing up blood when I cough and I've also had a headache. This all started Sunday. The 1st 2 coughs were a lot the next was just stringy.

## 2016-04-24 ENCOUNTER — Encounter: Payer: Self-pay | Admitting: Internal Medicine

## 2016-04-24 ENCOUNTER — Ambulatory Visit (INDEPENDENT_AMBULATORY_CARE_PROVIDER_SITE_OTHER): Payer: Medicare Other | Admitting: Internal Medicine

## 2016-04-24 VITALS — BP 138/78 | HR 86 | Ht 63.0 in | Wt 126.6 lb

## 2016-04-24 DIAGNOSIS — Z95810 Presence of automatic (implantable) cardiac defibrillator: Secondary | ICD-10-CM | POA: Diagnosis not present

## 2016-04-24 DIAGNOSIS — I428 Other cardiomyopathies: Secondary | ICD-10-CM | POA: Diagnosis not present

## 2016-04-24 DIAGNOSIS — I4821 Permanent atrial fibrillation: Secondary | ICD-10-CM

## 2016-04-24 DIAGNOSIS — I482 Chronic atrial fibrillation: Secondary | ICD-10-CM

## 2016-04-24 DIAGNOSIS — I442 Atrioventricular block, complete: Secondary | ICD-10-CM

## 2016-04-24 DIAGNOSIS — I501 Left ventricular failure: Secondary | ICD-10-CM

## 2016-04-24 LAB — CUP PACEART INCLINIC DEVICE CHECK
Battery Voltage: 2.99 V
Brady Statistic AP VS Percent: 0 %
Brady Statistic RA Percent Paced: 0 %
Brady Statistic RV Percent Paced: 99.94 %
HighPow Impedance: 51 Ohm
HighPow Impedance: 85 Ohm
Implantable Lead Implant Date: 20080908
Implantable Lead Implant Date: 20100121
Implantable Lead Location: 753858
Implantable Lead Location: 753860
Implantable Lead Model: 4196
Implantable Lead Model: 5076
Implantable Lead Model: 5076
Implantable Pulse Generator Implant Date: 20171004
Lead Channel Impedance Value: 361 Ohm
Lead Channel Impedance Value: 589 Ohm
Lead Channel Impedance Value: 589 Ohm
Lead Channel Impedance Value: 646 Ohm
Lead Channel Pacing Threshold Pulse Width: 0.4 ms
Lead Channel Pacing Threshold Pulse Width: 0.8 ms
Lead Channel Sensing Intrinsic Amplitude: 1 mV
Lead Channel Setting Pacing Amplitude: 2 V
Lead Channel Setting Pacing Amplitude: 2.25 V
Lead Channel Setting Pacing Pulse Width: 0.8 ms
MDC IDC LEAD IMPLANT DT: 20100121
MDC IDC LEAD IMPLANT DT: 20130704
MDC IDC LEAD LOCATION: 753859
MDC IDC LEAD LOCATION: 753860
MDC IDC MSMT BATTERY REMAINING LONGEVITY: 83 mo
MDC IDC MSMT LEADCHNL LV IMPEDANCE VALUE: 1064 Ohm
MDC IDC MSMT LEADCHNL LV PACING THRESHOLD AMPLITUDE: 1.25 V
MDC IDC MSMT LEADCHNL RA SENSING INTR AMPL: 3.5 mV
MDC IDC MSMT LEADCHNL RV IMPEDANCE VALUE: 513 Ohm
MDC IDC MSMT LEADCHNL RV PACING THRESHOLD AMPLITUDE: 0.75 V
MDC IDC SESS DTM: 20180308164426
MDC IDC SET LEADCHNL RV PACING PULSEWIDTH: 0.4 ms
MDC IDC SET LEADCHNL RV SENSING SENSITIVITY: 0.3 mV
MDC IDC STAT BRADY AP VP PERCENT: 0 %
MDC IDC STAT BRADY AS VP PERCENT: 99.92 %
MDC IDC STAT BRADY AS VS PERCENT: 0.08 %

## 2016-04-24 NOTE — Patient Instructions (Addendum)
Medication Instructions: - Your physician recommends that you continue on your current medications as directed. Please refer to the Current Medication list given to you today.  Labwork: - none ordered  Procedures/Testing: - none ordered  Follow-Up: - Remote monitoring is used to monitor your Pacemaker of ICD from home. This monitoring reduces the number of office visits required to check your device to one time per year. It allows [place] to keep an eye on the functioning of your device to ensure it is working properly. You are scheduled for a device check from home on 07/24/16. You may send your transmission at any time that day. If you have a wireless device, the transmission will be sent automatically. After your physician reviews your transmission, you will receive a postcard with your next transmission date.  - Your physician wants you to follow-up in: 1 year with Dr. Caryl Comes. You will receive a reminder letter in the mail two months in advance. If you don't receive a letter, please call our office to schedule the follow-up appointment.   Any Additional Special Instructions Will Be Listed Below (If Applicable).     If you need a refill on your cardiac medications before your next appointment, please call your pharmacy.

## 2016-04-24 NOTE — Progress Notes (Signed)
Patient Care Team: Biagio Borg, MD as PCP - General (Internal Medicine)   HPI  Michele [name] is a 83 y.o. 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   She underwent device generator replacement 3/18 [name] had a gastritis or  Echo 2010 EF 55-60% improved from 25-30%  Echocardiogram 12/16 retained normal LV function  .  She has done quite well. The patient denies chest pain, shortness of breath, nocturnal dyspnea, orthopnea or peripheral edema.  There have been no palpitations, lightheadedness or syncope.    Her husband died at the end of [id number]. She is doing relatively well.  Her children have been to visit.  No bleeding issues  Past Medical History:  Diagnosis Date  . Atrial fibrillation ([place])    NEG STRESS CARDIOLITE  . C. difficile colitis   . Cardiomyopathy    RATE RELATED  . CHF (congestive heart failure) ([place])   . Diverticulitis    PMH of X 2  . Diverticulosis    DIVERTICULITIS  . Gilbert's syndrome   . Hx of colonic polyp 2011   Dr Fuller Plan  . Hyperlipidemia    NMR LIOPROFILE LDL 234(3206/2234)HDL 37,TG86  . Renal insufficiency 12/09/2015  . Thyroid disease    HYPOTHYROIDISM...THYROID NODULES    Past Surgical History:  Procedure Laterality Date  . ABDOMINAL HYSTERECTOMY  1973  . APPENDECTOMY  1956  . BIOPSY THYROID     SINGLE NODULE  . BREAST BIOPSY  1997  . CARDIAC DEFIBRILLATOR PLACEMENT    . CHOLECYSTECTOMY  1972  . colonoscopy with polypectomy  2011  . EP IMPLANTABLE DEVICE N/A 11/21/2015   Procedure: BIV ICD Generator Changeout;  Surgeon: Deboraha Sprang, MD;  Location: [facility];  Service: Cardiovascular;  Laterality: N/A;  . ICD....02/2008    . LEAD REVISION N/A 08/21/2011   Procedure: LEAD REVISION;  Surgeon: Deboraha Sprang, MD;  Location: [facility] CATH LAB;  Service:  Cardiovascular;  Laterality: N/A;  . PACEMAKER PLACEMENT     10/2006  . ROTATOR CUFF REPAIR     x2  . THROIDECTOMY 12/08     benign    Current Outpatient Prescriptions  Medication Sig Dispense Refill  . cholecalciferol (VITAMIN D) 1000 UNITS tablet Take 1,000 Units by mouth daily.    Marland Kitchen co-enzyme Q-10 30 MG capsule Take 30 mg by mouth daily.    Marland Kitchen ezetimibe-simvastatin (VYTORIN) 10-20 MG tablet Take 1 tablet by mouth at bedtime. 90 tablet 3  . furosemide (LASIX) 20 MG tablet Take 1 tablet (20 mg total) by mouth as needed. 30 tablet 1  . levothyroxine (SYNTHROID) 112 MCG tablet TAKE 1 TABLET DAILY ON MONDAY, WEDNESDAY AND FRIDAY AND 1/2 TABLET ONOTHER DAYS 84 tablet 3  . Multiple Vitamin (MULTIVITAMIN WITH MINERALS) TABS Take 1 tablet by mouth daily.    Marland Kitchen OVER THE COUNTER MEDICATION Place 1 drop into both eyes daily as needed (dry eyes).    . warfarin (COUMADIN) 5 MG tablet TAKE AS DIRECTED. 30 tablet 3   No current facility-administered medications for this visit.     Allergies  Allergen Reactions  . Cephalexin Hives  . Erythromycin Swelling and Other (See Comments)    REACTION: swollen and sore mouth  . Terbinafine Hcl     Rash   . Colchicine Other (See Comments)    REACTION: violent diarrhea  .  Rosuvastatin Other (See Comments)    REACTION: upper arm pain bilaterally    Review of Systems negative except from HPI and PMH  Physical Exam BP 138/78   Pulse 86   Ht 5\' 3"  (1.6 m)   Wt 126 lb 9.6 oz (57.4 kg)   SpO2 98%   BMI 22.43 kg/m  Well developed and nourished in no acute distress HENT normal Neck supple with JVP-flat Clear Regular rate and rhythm, no murmurs or gallops Device pocket well healed; without hematoma or erythema.  There is no tethering Abd-soft with active BS No Clubbing cyanosis edema Skin-warm and dry A & Oriented  Grossly normal sensory and motor function   demonstrates ventricular pacing with underlying atrial fibrillation QRS is upright lead  V1 and biphasic negative positive in lead 1 QRS duration is 24     Assessment and  Plan Atrial fibrillation-permanent  Complete heart block  S/p AV ablation  Nonischemic cardiomyopathy -resolved  Implantable defibrillator-biventricular-[employer] The patient's device was interrogated.  The information was reviewed. No changes were made in the programming.      She remains on anticoagulation.  Euvolemic continue current meds  She is thinking of going to [place] dispense time with her sisters. It is better than spending $800 a month on the telephone.

## 2016-04-25 ENCOUNTER — Ambulatory Visit (INDEPENDENT_AMBULATORY_CARE_PROVIDER_SITE_OTHER): Payer: Medicare Other | Admitting: General Practice

## 2016-04-25 DIAGNOSIS — Z5181 Encounter for therapeutic drug level monitoring: Secondary | ICD-10-CM

## 2016-04-25 DIAGNOSIS — I4891 Unspecified atrial fibrillation: Secondary | ICD-10-CM

## 2016-04-25 LAB — POCT INR: INR: 4.4

## 2016-04-25 NOTE — Progress Notes (Signed)
I have reviewed and agree with the plan. 

## 2016-04-25 NOTE — Patient Instructions (Signed)
Pre visit review using our clinic review tool, if applicable. No additional management support is needed unless otherwise documented below in the visit note. 

## 2016-05-07 ENCOUNTER — Ambulatory Visit: Payer: Medicare Other

## 2016-05-09 ENCOUNTER — Ambulatory Visit (INDEPENDENT_AMBULATORY_CARE_PROVIDER_SITE_OTHER): Payer: Medicare Other | Admitting: General Practice

## 2016-05-09 DIAGNOSIS — Z5181 Encounter for therapeutic drug level monitoring: Secondary | ICD-10-CM | POA: Diagnosis not present

## 2016-05-09 DIAGNOSIS — I4891 Unspecified atrial fibrillation: Secondary | ICD-10-CM

## 2016-05-09 LAB — POCT INR: INR: 2.4

## 2016-05-09 NOTE — Patient Instructions (Signed)
Pre visit review using our clinic review tool, if applicable. No additional management support is needed unless otherwise documented below in the visit note. 

## 2016-05-10 NOTE — Progress Notes (Signed)
I have reviewed and agree with the plan. 

## 2016-05-20 ENCOUNTER — Other Ambulatory Visit (INDEPENDENT_AMBULATORY_CARE_PROVIDER_SITE_OTHER): Payer: Medicare Other

## 2016-05-20 ENCOUNTER — Encounter: Payer: Self-pay | Admitting: Internal Medicine

## 2016-05-20 ENCOUNTER — Ambulatory Visit (INDEPENDENT_AMBULATORY_CARE_PROVIDER_SITE_OTHER): Payer: Medicare Other | Admitting: Internal Medicine

## 2016-05-20 VITALS — BP 110/78 | HR 90 | Temp 97.7°F | Wt 129.0 lb

## 2016-05-20 DIAGNOSIS — N289 Disorder of kidney and ureter, unspecified: Secondary | ICD-10-CM | POA: Diagnosis not present

## 2016-05-20 DIAGNOSIS — E785 Hyperlipidemia, unspecified: Secondary | ICD-10-CM | POA: Diagnosis not present

## 2016-05-20 DIAGNOSIS — Z0001 Encounter for general adult medical examination with abnormal findings: Secondary | ICD-10-CM | POA: Diagnosis not present

## 2016-05-20 DIAGNOSIS — E890 Postprocedural hypothyroidism: Secondary | ICD-10-CM

## 2016-05-20 LAB — BASIC METABOLIC PANEL
BUN: 21 mg/dL (ref 6–23)
CALCIUM: 9.4 mg/dL (ref 8.4–10.5)
CO2: 28 mEq/L (ref 19–32)
Chloride: 102 mEq/L (ref 96–112)
Creatinine, Ser: 1.22 mg/dL — ABNORMAL HIGH (ref 0.40–1.20)
GFR: 44.56 mL/min — AB (ref >60.00)
Glucose, Bld: 97 mg/dL (ref 70–99)
Potassium: 4.3 mEq/L (ref 3.5–5.1)
Sodium: 136 mEq/L (ref 135–145)

## 2016-05-20 MED ORDER — ATORVASTATIN CALCIUM 20 MG PO TABS: 20.0000 mg | ORAL_TABLET | Freq: Every day | ORAL | 3 refills | Status: DC

## 2016-05-20 NOTE — Progress Notes (Signed)
Subjective:    Patient ID: Michele [name], female    DOB: 07-24-1929, 83 y.o.   MRN: [id number]  HPI  Here to f/u; overall doing ok,  Pt denies chest pain, increasing sob or doe, wheezing, orthopnea, PND, increased LE swelling, palpitations, dizziness or syncope.  Pt denies new neurological symptoms such as new headache, or facial or extremity weakness or numbness.  Pt denies polydipsia, polyuria, or low sugar episode.   Pt denies new neurological symptoms such as new headache, or facial or extremity weakness or numbness.   Pt states overall good compliance with meds, mostly trying to follow appropriate diet, with wt overall stable,[employer] no longer covers vytorin brand name, but she took the generic and had nightmares and hallucinations such as vulgar writing on the walls, so stopped and all nightmares resolved.   Denies hyper or hypo thyroid symptoms such as voice, skin or hair change. BP Readings from Last 3 Encounters:  05/20/16 110/78  04/24/16 138/78  04/01/16 105/70   Wt Readings from Last 3 Encounters:  05/20/16 129 lb (58.5 kg)  04/24/16 126 lb 9.6 oz (57.4 kg)  04/01/16 127 lb (57.6 kg)   Past Medical History:  Diagnosis Date  . Atrial fibrillation ([place])    NEG STRESS CARDIOLITE  . C. difficile colitis   . Cardiomyopathy    RATE RELATED  . CHF (congestive heart failure) ([place])   . Diverticulitis    PMH of X 2  . Diverticulosis    DIVERTICULITIS  . Gilbert's syndrome   . Hx of colonic polyp 2011   Dr Fuller Plan  . Hyperlipidemia    NMR LIOPROFILE LDL 234(3206/2234)HDL 37,TG86  . Renal insufficiency 12/09/2015  . Thyroid disease    HYPOTHYROIDISM...THYROID NODULES   Past Surgical History:  Procedure Laterality Date  . ABDOMINAL HYSTERECTOMY  1973  . APPENDECTOMY  1956  . BIOPSY THYROID     SINGLE NODULE  . BREAST BIOPSY  1997  . CARDIAC DEFIBRILLATOR PLACEMENT    . CHOLECYSTECTOMY  1972  . colonoscopy with polypectomy  2011  . EP IMPLANTABLE DEVICE N/A 11/21/2015   Procedure: BIV ICD Generator Changeout;  Surgeon: Deboraha Sprang, MD;  Location: [place] [facility];  Service: Cardiovascular;  Laterality: N/A;  . ICD....02/2008    . LEAD REVISION N/A 08/21/2011   Procedure: LEAD REVISION;  Surgeon: Deboraha Sprang, MD;  Location: [facility] CATH LAB;  Service: Cardiovascular;  Laterality: N/A;  . PACEMAKER PLACEMENT     10/2006  . ROTATOR CUFF REPAIR     x2  . THROIDECTOMY 12/08     benign    reports that she has never smoked. She has never used smokeless tobacco. She reports that she does not drink alcohol or use drugs. family history includes Heart attack (age of onset: 52) in her brother; Heart attack (age of onset: 28) in her mother; Lung disease in her father; Ulcerative colitis in her son; Vasculitis in her sister. Allergies  Allergen Reactions  . Cephalexin Hives  . Erythromycin Swelling and Other (See Comments)    REACTION: swollen and sore mouth  . Terbinafine Hcl     Rash   . Colchicine Other (See Comments)    REACTION: violent diarrhea  . Rosuvastatin Other (See Comments)    REACTION: upper arm pain bilaterally  . Vytorin [Ezetimibe-Simvastatin] Other (See Comments)    Nightmares with generic   Current Outpatient Prescriptions on File Prior to Visit  Medication Sig Dispense Refill  . cholecalciferol (VITAMIN  D) 1000 UNITS tablet Take 1,000 Units by mouth daily.    Marland Kitchen co-enzyme Q-10 30 MG capsule Take 30 mg by mouth daily.    . furosemide (LASIX) 20 MG tablet Take 1 tablet (20 mg total) by mouth as needed. 30 tablet 1  . Multiple Vitamin (MULTIVITAMIN WITH MINERALS) TABS Take 1 tablet by mouth daily.    Marland Kitchen OVER THE COUNTER MEDICATION Place 1 drop into both eyes daily as needed (dry eyes).    . warfarin (COUMADIN) 5 MG tablet TAKE AS DIRECTED. 30 tablet 3   No current facility-administered medications on file prior to visit.    Review of Systems  Constitutional: Negative for other unusual diaphoresis or sweats HENT: Negative for ear discharge  or swelling Eyes: Negative for other worsening visual disturbances Respiratory: Negative for stridor or other swelling  Gastrointestinal: Negative for worsening distension or other blood Genitourinary: Negative for retention or other urinary change Musculoskeletal: Negative for other MSK pain or swelling Skin: Negative for color change or other new lesions Neurological: Negative for worsening tremors and other numbness  Psychiatric/Behavioral: Negative for worsening agitation or other fatigue All other system neg per pt    Objective:   Physical Exam BP 110/78   Pulse 90   Temp 97.7 F (36.5 C) (Oral)   Wt 129 lb (58.5 kg)   SpO2 98%   BMI 22.85 kg/m  VS noted,  Constitutional: Pt appears in NAD HENT: Head: NCAT.  Right Ear: External ear normal.  Left Ear: External ear normal.  Eyes: . Pupils are equal, round, and reactive to light. Conjunctivae and EOM are normal Nose: without d/c or deformity Neck: Neck supple. Gross normal ROM Cardiovascular: Normal rate and regular rhythm.   Pulmonary/Chest: Effort normal and breath sounds without rales or wheezing.  Neurological: Pt is alert. At baseline orientation, motor grossly intact Skin: Skin is warm. No rashes, other new lesions, no LE edema Psychiatric: Pt behavior is normal without agitation  No other exam findings    Assessment & Plan:

## 2016-05-20 NOTE — Patient Instructions (Signed)
Please take all new medication as prescribed - the lipitor at 20 mg per day  Please continue all other medications as before, and refills have been done if requested.  Please have the pharmacy call with any other refills you may need.  Please keep your appointments with your specialists as you may have planned  Please go to the LAB in the Basement (turn left off the elevator) for the tests to be done today  You will be contacted by phone if any changes need to be made immediately.  Otherwise, you will receive a letter about your results with an explanation, but please check with MyChart first.  Please remember to sign up for MyChart if you have not done so, as this will be important to you in the future with finding out test results, communicating by private email, and scheduling acute appointments online when needed.  Please return in 6 months, or sooner if needed, with Lab testing done 3-5 days before

## 2016-05-20 NOTE — Progress Notes (Signed)
Pre visit review using our clinic review tool, if applicable. No additional management support is needed unless otherwise documented below in the visit note. 

## 2016-05-21 ENCOUNTER — Other Ambulatory Visit: Payer: Self-pay | Admitting: Internal Medicine

## 2016-05-21 ENCOUNTER — Telehealth: Payer: Self-pay

## 2016-05-21 LAB — T4, FREE: Free T4: 1.34 ng/dL (ref 0.60–1.60)

## 2016-05-21 LAB — TSH: TSH: 0.31 u[IU]/mL — ABNORMAL LOW (ref 0.35–4.50)

## 2016-05-21 MED ORDER — LEVOTHYROXINE SODIUM 88 MCG PO TABS: 88.0000 ug | ORAL_TABLET | Freq: Every day | ORAL | 3 refills | Status: DC

## 2016-05-21 NOTE — Telephone Encounter (Signed)
-----   Message from Biagio Borg, MD sent at 05/21/2016  8:26 AM EDT ----- Left message on MyChart, pt to cont same tx except  The test results show that your current treatment is OK, except the thyroid test is still somewhat "off".  We should stop your current treatment with the 112 mcg pills, and change to 88 mcg per day (levothyroxine).  A new prescription will be sent, and you should hear from the office.  This can be rechecked at your next visit.   Josselyn Harkins to please inform pt, I will do rx

## 2016-05-21 NOTE — Telephone Encounter (Signed)
Patient was informed and expressed understanding.  

## 2016-05-26 ENCOUNTER — Encounter (HOSPITAL_COMMUNITY): Payer: Self-pay

## 2016-05-26 ENCOUNTER — Observation Stay (HOSPITAL_COMMUNITY)
Admission: EM | Admit: 2016-05-26 | Discharge: 2016-05-28 | Disposition: A | Discharge status: Home Health | Payer: Medicare Other | Attending: Nephrology | Admitting: Nephrology

## 2016-05-26 ENCOUNTER — Emergency Department (HOSPITAL_COMMUNITY): Payer: Medicare Other

## 2016-05-26 DIAGNOSIS — R2681 Unsteadiness on feet: Secondary | ICD-10-CM | POA: Insufficient documentation

## 2016-05-26 DIAGNOSIS — I482 Chronic atrial fibrillation: Secondary | ICD-10-CM | POA: Insufficient documentation

## 2016-05-26 DIAGNOSIS — Z950 Presence of cardiac pacemaker: Secondary | ICD-10-CM | POA: Diagnosis not present

## 2016-05-26 DIAGNOSIS — E890 Postprocedural hypothyroidism: Secondary | ICD-10-CM | POA: Insufficient documentation

## 2016-05-26 DIAGNOSIS — I639 Cerebral infarction, unspecified: Secondary | ICD-10-CM

## 2016-05-26 DIAGNOSIS — Z95810 Presence of automatic (implantable) cardiac defibrillator: Secondary | ICD-10-CM | POA: Diagnosis not present

## 2016-05-26 DIAGNOSIS — Z881 Allergy status to other antibiotic agents status: Secondary | ICD-10-CM | POA: Insufficient documentation

## 2016-05-26 DIAGNOSIS — N183 Chronic kidney disease, stage 3 (moderate): Secondary | ICD-10-CM | POA: Insufficient documentation

## 2016-05-26 DIAGNOSIS — Z7982 Long term (current) use of aspirin: Secondary | ICD-10-CM | POA: Insufficient documentation

## 2016-05-26 DIAGNOSIS — I509 Heart failure, unspecified: Secondary | ICD-10-CM | POA: Insufficient documentation

## 2016-05-26 DIAGNOSIS — S0990XA Unspecified injury of head, initial encounter: Secondary | ICD-10-CM | POA: Diagnosis not present

## 2016-05-26 DIAGNOSIS — I4891 Unspecified atrial fibrillation: Secondary | ICD-10-CM | POA: Diagnosis present

## 2016-05-26 DIAGNOSIS — Z86010 Personal history of colonic polyps: Secondary | ICD-10-CM | POA: Insufficient documentation

## 2016-05-26 DIAGNOSIS — Z7901 Long term (current) use of anticoagulants: Secondary | ICD-10-CM | POA: Diagnosis not present

## 2016-05-26 DIAGNOSIS — G450 Vertebro-basilar artery syndrome: Secondary | ICD-10-CM

## 2016-05-26 DIAGNOSIS — I130 Hypertensive heart and chronic kidney disease with heart failure and stage 1 through stage 4 chronic kidney disease, or unspecified chronic kidney disease: Secondary | ICD-10-CM | POA: Insufficient documentation

## 2016-05-26 DIAGNOSIS — R42 Dizziness and giddiness: Secondary | ICD-10-CM | POA: Diagnosis not present

## 2016-05-26 DIAGNOSIS — I429 Cardiomyopathy, unspecified: Secondary | ICD-10-CM | POA: Diagnosis not present

## 2016-05-26 DIAGNOSIS — R404 Transient alteration of awareness: Secondary | ICD-10-CM | POA: Diagnosis not present

## 2016-05-26 DIAGNOSIS — E785 Hyperlipidemia, unspecified: Secondary | ICD-10-CM | POA: Diagnosis not present

## 2016-05-26 DIAGNOSIS — R410 Disorientation, unspecified: Principal | ICD-10-CM | POA: Insufficient documentation

## 2016-05-26 DIAGNOSIS — Z8673 Personal history of transient ischemic attack (TIA), and cerebral infarction without residual deficits: Secondary | ICD-10-CM | POA: Insufficient documentation

## 2016-05-26 DIAGNOSIS — G43109 Migraine with aura, not intractable, without status migrainosus: Secondary | ICD-10-CM

## 2016-05-26 LAB — CBC
HEMATOCRIT: 37.3 % (ref 36.0–46.0)
Hemoglobin: 12.9 g/dL (ref 12.0–15.0)
MCH: 29.9 pg (ref 26.0–34.0)
MCHC: 34.6 g/dL (ref 30.0–36.0)
MCV: 86.5 fL (ref 78.0–100.0)
PLATELETS: 154 10*3/uL (ref 150–400)
RBC: 4.31 MIL/uL (ref 3.87–5.11)
RDW: 13.2 % (ref 11.5–15.5)
WBC: 5.5 10*3/uL (ref 4.0–10.5)

## 2016-05-26 LAB — COMPREHENSIVE METABOLIC PANEL
ALT: 16 U/L (ref 14–54)
AST: 24 U/L (ref 15–41)
Albumin: 3.7 g/dL (ref 3.5–5.0)
Alkaline Phosphatase: 55 U/L (ref 38–126)
Anion gap: 11 (ref 5–15)
BUN: 22 mg/dL — ABNORMAL HIGH (ref 6–20)
CHLORIDE: 102 mmol/L (ref 101–111)
CO2: 22 mmol/L (ref 22–32)
Calcium: 9.1 mg/dL (ref 8.9–10.3)
Creatinine, Ser: 1.28 mg/dL — ABNORMAL HIGH (ref 0.44–1.00)
GFR, EST AFRICAN AMERICAN: 43 mL/min — AB (ref >60)
GFR, EST NON AFRICAN AMERICAN: 37 mL/min — AB (ref >60)
Glucose, Bld: 94 mg/dL (ref 65–99)
POTASSIUM: 3.3 mmol/L — AB (ref 3.5–5.1)
SODIUM: 135 mmol/L (ref 135–145)
Total Bilirubin: 1.6 mg/dL — ABNORMAL HIGH (ref 0.3–1.2)
Total Protein: 6.3 g/dL — ABNORMAL LOW (ref 6.5–8.1)

## 2016-05-26 LAB — I-STAT CHEM 8, ED
BUN: 23 mg/dL — ABNORMAL HIGH (ref 6–20)
Calcium, Ion: 1.04 mmol/L — ABNORMAL LOW (ref 1.15–1.40)
Chloride: 102 mmol/L (ref 101–111)
Creatinine, Ser: 1.2 mg/dL — ABNORMAL HIGH (ref 0.44–1.00)
Glucose, Bld: 94 mg/dL (ref 65–99)
HEMATOCRIT: 36 % (ref 36.0–46.0)
HEMOGLOBIN: 12.2 g/dL (ref 12.0–15.0)
POTASSIUM: 3.2 mmol/L — AB (ref 3.5–5.1)
Sodium: 135 mmol/L (ref 135–145)
TCO2: 20 mmol/L (ref 0–100)

## 2016-05-26 LAB — CBG MONITORING, ED: Glucose-Capillary: 87 mg/dL (ref 65–99)

## 2016-05-26 LAB — DIFFERENTIAL
BASOS ABS: 0 10*3/uL (ref 0.0–0.1)
BASOS PCT: 0 %
EOS ABS: 0.1 10*3/uL (ref 0.0–0.7)
Eosinophils Relative: 1 %
Lymphocytes Relative: 34 %
Lymphs Abs: 1.9 10*3/uL (ref 0.7–4.0)
Monocytes Absolute: 0.5 10*3/uL (ref 0.1–1.0)
Monocytes Relative: 8 %
Neutro Abs: 3.1 10*3/uL (ref 1.7–7.7)
Neutrophils Relative %: 57 %

## 2016-05-26 LAB — PROTIME-INR
INR: 2.17
Prothrombin Time: 24.6 seconds — ABNORMAL HIGH (ref 11.4–15.2)

## 2016-05-26 LAB — APTT: aPTT: 46 seconds — ABNORMAL HIGH (ref 24–36)

## 2016-05-26 LAB — I-STAT TROPONIN, ED: TROPONIN I, POC: 0 ng/mL (ref 0.00–0.08)

## 2016-05-26 MED ORDER — POTASSIUM CHLORIDE CRYS ER 20 MEQ PO TBCR
40.0000 meq | EXTENDED_RELEASE_TABLET | Freq: Once | ORAL | Status: AC
  Administered 2016-05-26: 40 meq via ORAL
  Filled 2016-05-26: qty 2

## 2016-05-26 NOTE — Assessment & Plan Note (Signed)
stable overall by history and exam, recent data reviewed with pt, and pt to continue medical treatment as before,  to f/u any worsening symptoms or concerns Lab Results  Component Value Date   TSH 0.31 (L) 05/20/2016

## 2016-05-26 NOTE — Code Documentation (Signed)
Code stroke called at 2009 for this 83 y/o white female who was LKW at 1430 hrs.  Pt drove  [employer]erself to [employer] , had to  wait on  A presscription refill.  AT 1700 a store clerk found pt sitting slumped in chair and c/o dizziness.  The clerk gave her water, returned 30 minutes later and escorted pt to car where she continued to c/o dizziness.  The clerk called pts neighbor who met pt at [employer].  She called EMS upon her arrival  finding pt very anxious, still dizzy  and gripped to the steering wheel.  She was not appropriately responding per neighbor.    Upon arrival of EMS pt was given Midazolam  for agitation.  CBG 123.  Pt arrived [facility] at 2009, was cleared for CT at 2015 arriving CT at 2021.  No acute intracranial process CT result was called Dr Vanita Panda at 2037.  Pt was taken to [facility] Trauma [facility] where she scored a 4 on the NIHSS with points given for wrong age, complete hemianopia on left , and minor sensory loss on right arm.  Pt was outside the window for TPA.  Will be admitted to medicine service.

## 2016-05-26 NOTE — ED Provider Notes (Signed)
[facility] DEPT Provider Note   CSN: [id number] Arrival date & time: 05/26/16  2008     History   Chief Complaint No chief complaint on file.   HPI Michele [name] is a 83 y.o. female.   Cerebrovascular Accident  This is a new problem. The current episode started 3 to 5 hours ago. The problem occurs hourly. The problem has been gradually improving. Pertinent negatives include no chest pain, no abdominal pain, no headaches and no shortness of breath. Nothing aggravates the symptoms. Nothing relieves the symptoms. She has tried nothing for the symptoms.    Past Medical History:  Diagnosis Date  . Atrial fibrillation ([place])    NEG STRESS CARDIOLITE  . C. difficile colitis   . Cardiomyopathy    RATE RELATED  . CHF (congestive heart failure) ([place])   . Diverticulitis    PMH of X 2  . Diverticulosis    DIVERTICULITIS  . Gilbert's syndrome   . Hx of colonic polyp 2011   Dr Fuller Plan  . Hyperlipidemia    NMR LIOPROFILE LDL 234(3206/2234)HDL 37,TG86  . Renal insufficiency 12/09/2015  . Thyroid disease    HYPOTHYROIDISM...THYROID NODULES    Patient Active Problem List   Diagnosis Date Noted  . Stroke (cerebrum) ([place]) 05/26/2016  . AKI (acute kidney injury) ([place]) 12/13/2015  . Urinary retention 12/13/2015  . Renal insufficiency 12/09/2015  . Encounter for preventative adult health care exam with abnormal findings 07/13/2015  . Grief 07/13/2015  . Encounter for therapeutic drug monitoring 04/20/2013  . Cardiomyopathy, secondary ([place]) 03/20/2009  . CHEST PAIN -.Peri INCISIONAL 07/14/2008  . Automatic implantable cardioverter-defibrillator in situ 04/19/2008  . FATIGUE 08/31/2007  . Personal history of other diseases of digestive system 04/14/2007  . Hyperlipidemia 12/22/2006  . Anxiety state 11/27/2006  . KERATOCONJUNCTIVITIS SICCA 09/08/2006  . ATRIAL FIBRILLATION 07/14/2006  . Hypothyroidism, postsurgical 07/09/2006    Past Surgical History:  Procedure Laterality  Date  . ABDOMINAL HYSTERECTOMY  1973  . APPENDECTOMY  1956  . BIOPSY THYROID     SINGLE NODULE  . BREAST BIOPSY  1997  . CARDIAC DEFIBRILLATOR PLACEMENT    . CHOLECYSTECTOMY  1972  . colonoscopy with polypectomy  2011  . EP IMPLANTABLE DEVICE N/A 11/21/2015   Procedure: BIV ICD Generator Changeout;  Surgeon: Deboraha Sprang, MD;  Location: [facility] CV LAB;  Service: Cardiovascular;  Laterality: N/A;  . ICD....02/2008    . LEAD REVISION N/A 08/21/2011   Procedure: LEAD REVISION;  Surgeon: Deboraha Sprang, MD;  Location: [facility] CATH LAB;  Service: Cardiovascular;  Laterality: N/A;  . PACEMAKER PLACEMENT     10/2006  . ROTATOR CUFF REPAIR     x2  . THROIDECTOMY 12/08     benign    OB History    No data available       Home Medications    Prior to Admission medications   Medication Sig Start Date End Date Taking? Authorizing Provider  cholecalciferol (VITAMIN D) 1000 UNITS tablet Take 1,000 Units by mouth daily.   Yes Historical Provider, MD  co-enzyme Q-10 30 MG capsule Take 30 mg by mouth daily.   Yes Historical Provider, MD  furosemide (LASIX) 20 MG tablet Take 1 tablet (20 mg total) by mouth as needed. Patient taking differently: Take 20 mg by mouth as needed for fluid.  01/07/16  Yes Biagio Borg, MD  warfarin (COUMADIN) 5 MG tablet TAKE AS DIRECTED. Patient taking differently: Take 2.5-5 mg by mouth See admin  instructions. Take 1/2 tablet (2.5 mg) on Monday and Friday then take 1 tablet (5 mg) all the other days 01/07/16  Yes Biagio Borg, MD  atorvastatin (LIPITOR) 20 MG tablet Take 1 tablet (20 mg total) by mouth daily. 05/20/16 05/20/17  Biagio Borg, MD  levothyroxine (SYNTHROID, LEVOTHROID) 88 MCG tablet Take 1 tablet (88 mcg total) by mouth daily. 05/21/16   Biagio Borg, MD    Family History Family History  Problem Relation Age of Onset  . Heart attack Mother 69  . Heart attack Brother 82  . Lung disease Father     Silicosis  . Vasculitis Sister     Giant Cell Arteritis    . Ulcerative colitis Son     Social History Social History  Substance Use Topics  . Smoking status: Never Smoker  . Smokeless tobacco: Never Used  . Alcohol use No     Allergies   Cephalexin; Erythromycin; Terbinafine hcl; Colchicine; Rosuvastatin; and Vytorin [ezetimibe-simvastatin]   Review of Systems Review of Systems  Constitutional: Negative for chills and fever.  HENT: Negative for ear pain and sore throat.   Eyes: Negative for pain and visual disturbance.  Respiratory: Negative for cough and shortness of breath.   Cardiovascular: Negative for chest pain and palpitations.  Gastrointestinal: Negative for abdominal pain and vomiting.  Genitourinary: Negative for dysuria and hematuria.  Musculoskeletal: Negative for arthralgias and back pain.  Skin: Negative for color change and rash.  Neurological: Positive for speech difficulty. Negative for seizures, syncope and headaches.  Psychiatric/Behavioral: Positive for agitation and confusion.  All other systems reviewed and are negative.    Physical Exam Updated Vital Signs BP (!) 148/97 (BP Location: Right Arm)   Pulse 70   Temp 97.8 F (36.6 C) (Oral)   Resp 18   Ht 5\' 6"  (1.676 m)   Wt 58.5 kg   SpO2 100%   BMI 20.82 kg/m   Physical Exam  Constitutional: She appears well-developed and well-nourished. No distress.  HENT:  Head: Normocephalic and atraumatic.  Eyes: Conjunctivae are normal.  Neck: Neck supple.  Cardiovascular: Normal rate and regular rhythm.   No murmur heard. Pulmonary/Chest: Effort normal and breath sounds normal. No respiratory distress.  Abdominal: Soft. There is no tenderness.  Musculoskeletal: She exhibits no edema.  Neurological: She is alert. She has normal strength. She is not disoriented. No cranial nerve deficit or sensory deficit. Coordination normal. GCS eye subscore is 4. GCS verbal subscore is 5. GCS motor subscore is 6.  Skin: Skin is warm and dry.  Psychiatric: She has a  normal mood and affect. Her speech is normal. She exhibits abnormal recent memory.  Nursing note and vitals reviewed.    ED Treatments / Results  Labs (all labs ordered are listed, but only abnormal results are displayed) Labs Reviewed  PROTIME-INR - Abnormal; Notable for the following:       Result Value   Prothrombin Time 24.6 (*)    All other components within normal limits  APTT - Abnormal; Notable for the following:    aPTT 46 (*)    All other components within normal limits  COMPREHENSIVE METABOLIC PANEL - Abnormal; Notable for the following:    Potassium 3.3 (*)    BUN 22 (*)    Creatinine, Ser 1.28 (*)    Total Protein 6.3 (*)    Total Bilirubin 1.6 (*)    GFR calc non Af Amer 37 (*)    GFR calc Af Wyvonnia Lora  43 (*)    All other components within normal limits  I-STAT CHEM 8, ED - Abnormal; Notable for the following:    Potassium 3.2 (*)    BUN 23 (*)    Creatinine, Ser 1.20 (*)    Calcium, Ion 1.04 (*)    All other components within normal limits  CBC  DIFFERENTIAL  CBG MONITORING, ED  I-STAT TROPOININ, ED  CBG MONITORING, ED    EKG  EKG Interpretation None       Radiology Ct Head Code Stroke W/o Cm  Result Date: 05/26/2016 CLINICAL DATA:  Code stroke. Fall, weakness, speech difficulty. History of atrial fibrillation, AICD. EXAM: CT HEAD WITHOUT CONTRAST TECHNIQUE: Contiguous axial images were obtained from the base of the skull through the vertex without intravenous contrast. COMPARISON:  CT HEAD October 18, 2013 FINDINGS: BRAIN: No intraparenchymal hemorrhage, mass effect nor midline shift. The ventricles and sulci are normal for age. Patchy to confluent supratentorial white matter hypodensities increased from stick prior study. No acute large vascular territory infarcts. No abnormal extra-axial fluid collections. Basal cisterns are patent. VASCULAR: Moderate calcific atherosclerosis of the carotid siphons. SKULL: No skull fracture. No significant scalp soft  tissue swelling. SINUSES/ORBITS: The mastoid air-cells and included paranasal sinuses are well-aerated.The included ocular globes and orbital contents are non-suspicious. OTHER: None. ASPECTS [facility] Stroke Program Early CT Score) - Ganglionic level infarction (caudate, lentiform nuclei, internal capsule, insula, M1-M3 cortex): 7 - Supraganglionic infarction (M4-M6 cortex): 3 Total score (0-10 with 10 being normal): 10 IMPRESSION: 1. No acute intracranial process. 2. Progressed moderate to severe chronic small vessel ischemic disease. 3. ASPECTS is 10. Critical Value/emergent results were called by telephone at the time of interpretation on 05/26/2016 at 8:37 pm to Dr. Carmin Muskrat , who verbally acknowledged these results. Electronically Signed   By: Elon Alas M.D.   On: 05/26/2016 20:38    Procedures Procedures (including critical care time)  Medications Ordered in ED Medications  potassium chloride SA (K-DUR,KLOR-CON) CR tablet 40 mEq (40 mEq Oral Given 05/26/16 2152)     Initial Impression / Assessment and Plan / ED Course  I have reviewed the triage vital signs and the nursing notes.  Pertinent labs & imaging results that were available during my care of the patient were reviewed by me and considered in my medical decision making (see chart for details).     83 year old female with history of atrial fibrillation on Coumadin presents in the setting concern for CVA. Patient was at [employer] several hours ago when she started to have abnormal behavior. Patient slumped to ground and speaking abnormally. Patient found in car to be holding onto the steering will and agitated. Patient having difficulty finding words. Patient required benzodiazepine with EMS for agitation and confusion. Symptoms improved after benzodiazepine.  On arrival patient was hemodynamically stable and afebrile. Patient initially slow to respond to questions but began to gradually improve. No focal neurologic deficits  noted. Patient reports she was confused as to the events earlier today but prior to these events reported no complaints. Code stroke was initiated in the setting of these findings and neurology quickly assessed patient. CT head revealed no acute hemorrhagic stroke. Neurology concern with left-sided hemianopsia and concern for CVA. Laboratory analysis revealed mild hypokalemia which was repleted orally. No other significant abnormality. TPA was contraindicated secondary to Coumadin status and patient will be admitted to hospitalist team for further CVA workup. Patient remained stable in emergency department at time of transfer of care.  Final Clinical Impressions(s) / ED Diagnoses   Final diagnoses:  Cerebrovascular accident (CVA), unspecified mechanism [facility])    New Prescriptions Current Discharge Medication List       Esaw Grandchild, MD 05/26/16 [place], MD 05/27/16 2351

## 2016-05-26 NOTE — Assessment & Plan Note (Signed)
stable overall by history and exam, recent data reviewed with pt, and pt to change to lipitor due to insurance issue,  to f/u any worsening symptoms or concerns Lab Results  Component Value Date   LDLCALC 63 12/07/2015

## 2016-05-26 NOTE — ED Notes (Signed)
Dr Vanita Panda calling code stroke, CT notified.

## 2016-05-26 NOTE — ED Triage Notes (Signed)
Per EMS, pt was in [employer] shopping and had sudden onset dizziness, pt went to her car and called ems, on scene pt was found in her car gripping her steering wheel and grabbing her throat altered and was having non voluntary facial movements. In route pt was anxious and gripping rails on stretcher and given midazolam 2.5mg  and pt calmed down. CBG 123. PERRL prior to midazolam. Equal grips, no slurred speech, no neuro deficits. VS 100% on NRB, RR 14, 139/82, 12 lead showed paced rhythm 83 and irregular. Unknown LSN at this time. Pt is accompanied by neighbor who just happened to be in the store at the same time and states that pt also fell in the store.

## 2016-05-26 NOTE — Assessment & Plan Note (Signed)
stable overall by history and exam, recent data reviewed with pt, and pt to continue medical treatment as before,  to f/u any worsening symptoms or concerns Lab Results  Component Value Date   CREATININE 1.22 (H) 05/20/2016

## 2016-05-26 NOTE — ED Notes (Signed)
ACTIVATED CODE STROKE @ 20:14

## 2016-05-26 NOTE — ED Notes (Signed)
Pt back from CT, Neurologist, Rapid response, this RN and EMT in room with pt.

## 2016-05-26 NOTE — Progress Notes (Signed)
Pt admitted from ED with stroke like symptoms, alert and oriented, but still c/o of dizziness with movement, settled in bed with call light and family at bedside, tele monitor put and verified on pt, was reassured and will continue to monitor, v/s stable. Obasogie-Asidi, Caydence Enck Efe

## 2016-05-26 NOTE — ED Notes (Addendum)
LSN now determined to be at 1430 this afternoon. Pt is outside window for TPA.

## 2016-05-26 NOTE — ED Notes (Signed)
CBG 87. 

## 2016-05-27 ENCOUNTER — Encounter (HOSPITAL_COMMUNITY): Payer: Self-pay | Admitting: Internal Medicine

## 2016-05-27 ENCOUNTER — Observation Stay (HOSPITAL_COMMUNITY): Payer: Medicare Other

## 2016-05-27 DIAGNOSIS — R51 Headache: Secondary | ICD-10-CM | POA: Diagnosis not present

## 2016-05-27 DIAGNOSIS — G450 Vertebro-basilar artery syndrome: Secondary | ICD-10-CM

## 2016-05-27 DIAGNOSIS — I6529 Occlusion and stenosis of unspecified carotid artery: Secondary | ICD-10-CM | POA: Diagnosis not present

## 2016-05-27 DIAGNOSIS — G43109 Migraine with aura, not intractable, without status migrainosus: Secondary | ICD-10-CM

## 2016-05-27 DIAGNOSIS — I480 Paroxysmal atrial fibrillation: Secondary | ICD-10-CM

## 2016-05-27 DIAGNOSIS — I429 Cardiomyopathy, unspecified: Secondary | ICD-10-CM

## 2016-05-27 DIAGNOSIS — I639 Cerebral infarction, unspecified: Secondary | ICD-10-CM | POA: Diagnosis present

## 2016-05-27 DIAGNOSIS — I4891 Unspecified atrial fibrillation: Secondary | ICD-10-CM

## 2016-05-27 DIAGNOSIS — R42 Dizziness and giddiness: Secondary | ICD-10-CM | POA: Diagnosis not present

## 2016-05-27 LAB — COMPREHENSIVE METABOLIC PANEL
ALT: 15 U/L (ref 14–54)
AST: 24 U/L (ref 15–41)
Albumin: 3.5 g/dL (ref 3.5–5.0)
Alkaline Phosphatase: 51 U/L (ref 38–126)
Anion gap: 9 (ref 5–15)
BILIRUBIN TOTAL: 2.3 mg/dL — AB (ref 0.3–1.2)
BUN: 15 mg/dL (ref 6–20)
CO2: 23 mmol/L (ref 22–32)
Calcium: 8.9 mg/dL (ref 8.9–10.3)
Chloride: 105 mmol/L (ref 101–111)
Creatinine, Ser: 1.09 mg/dL — ABNORMAL HIGH (ref 0.44–1.00)
GFR calc Af Amer: 52 mL/min — ABNORMAL LOW (ref >60)
GFR, EST NON AFRICAN AMERICAN: 45 mL/min — AB (ref >60)
Glucose, Bld: 87 mg/dL (ref 65–99)
POTASSIUM: 3.8 mmol/L (ref 3.5–5.1)
Sodium: 137 mmol/L (ref 135–145)
TOTAL PROTEIN: 6.2 g/dL — AB (ref 6.5–8.1)

## 2016-05-27 LAB — LIPID PANEL
CHOLESTEROL: 229 mg/dL — AB (ref 0–200)
HDL: 44 mg/dL (ref >40)
LDL Cholesterol: 171 mg/dL — ABNORMAL HIGH (ref 0–99)
Total CHOL/HDL Ratio: 5.2 RATIO
Triglycerides: 70 mg/dL (ref <150)
VLDL: 14 mg/dL (ref 0–40)

## 2016-05-27 LAB — CBC
HCT: 37.4 % (ref 36.0–46.0)
Hemoglobin: 12.8 g/dL (ref 12.0–15.0)
MCH: 29.5 pg (ref 26.0–34.0)
MCHC: 34.2 g/dL (ref 30.0–36.0)
MCV: 86.2 fL (ref 78.0–100.0)
PLATELETS: 161 10*3/uL (ref 150–400)
RBC: 4.34 MIL/uL (ref 3.87–5.11)
RDW: 13.5 % (ref 11.5–15.5)
WBC: 6.1 10*3/uL (ref 4.0–10.5)

## 2016-05-27 LAB — PROTIME-INR
INR: 2.01
PROTHROMBIN TIME: 23.1 s — AB (ref 11.4–15.2)

## 2016-05-27 MED ORDER — ACETAMINOPHEN 650 MG RE SUPP: 650.0000 mg | RECTAL | Status: DC | PRN

## 2016-05-27 MED ORDER — IOPAMIDOL (ISOVUE-370) INJECTION 76%
INTRAVENOUS | Status: AC
  Filled 2016-05-27: qty 50

## 2016-05-27 MED ORDER — ATORVASTATIN CALCIUM 40 MG PO TABS
40.0000 mg | ORAL_TABLET | Freq: Every day | ORAL | Status: DC
  Administered 2016-05-28: 40 mg via ORAL
  Filled 2016-05-27: qty 1

## 2016-05-27 MED ORDER — LEVOTHYROXINE SODIUM 88 MCG PO TABS
88.0000 ug | ORAL_TABLET | Freq: Every day | ORAL | Status: DC
  Administered 2016-05-27 – 2016-05-28 (×2): 88 ug via ORAL
  Filled 2016-05-27 (×2): qty 1

## 2016-05-27 MED ORDER — ASPIRIN 325 MG PO TABS
325.0000 mg | ORAL_TABLET | Freq: Every day | ORAL | Status: DC
  Administered 2016-05-27 – 2016-05-28 (×2): 325 mg via ORAL
  Filled 2016-05-27 (×2): qty 1

## 2016-05-27 MED ORDER — WARFARIN SODIUM 5 MG PO TABS
2.5000 mg | ORAL_TABLET | ORAL | Status: DC
  Filled 2016-05-27: qty 1

## 2016-05-27 MED ORDER — ATORVASTATIN CALCIUM 10 MG PO TABS
20.0000 mg | ORAL_TABLET | Freq: Every day | ORAL | Status: DC
  Administered 2016-05-27: 20 mg via ORAL
  Filled 2016-05-27: qty 2

## 2016-05-27 MED ORDER — ACETAMINOPHEN 160 MG/5ML PO SOLN: 650.0000 mg | ORAL | Status: DC | PRN

## 2016-05-27 MED ORDER — SODIUM CHLORIDE 0.9 % IV BOLUS (SEPSIS)
250.0000 mL | Freq: Once | INTRAVENOUS | Status: AC
  Administered 2016-05-27: 250 mL via INTRAVENOUS

## 2016-05-27 MED ORDER — WARFARIN - PHARMACIST DOSING INPATIENT
Freq: Every day | Status: DC
  Administered 2016-05-27: 21:00:00

## 2016-05-27 MED ORDER — SODIUM CHLORIDE 0.9 % IV SOLN
INTRAVENOUS | Status: AC
  Administered 2016-05-27: 01:00:00 via INTRAVENOUS

## 2016-05-27 MED ORDER — ACETAMINOPHEN 325 MG PO TABS: 650.0000 mg | ORAL_TABLET | ORAL | Status: DC | PRN

## 2016-05-27 MED ORDER — VITAMIN D 1000 UNITS PO TABS
1000.0000 [IU] | ORAL_TABLET | Freq: Every day | ORAL | Status: DC
  Administered 2016-05-27 – 2016-05-28 (×2): 1000 [IU] via ORAL
  Filled 2016-05-27 (×2): qty 1

## 2016-05-27 MED ORDER — WARFARIN SODIUM 5 MG PO TABS
5.0000 mg | ORAL_TABLET | Freq: Once | ORAL | Status: AC
  Administered 2016-05-27: 5 mg via ORAL
  Filled 2016-05-27: qty 1

## 2016-05-27 MED ORDER — WARFARIN SODIUM 5 MG PO TABS: 5.0000 mg | ORAL_TABLET | ORAL | Status: DC

## 2016-05-27 MED ORDER — STROKE: EARLY STAGES OF RECOVERY BOOK
Freq: Once | Status: AC
  Administered 2016-05-27: 02:00:00
  Filled 2016-05-27: qty 1

## 2016-05-27 MED ORDER — WARFARIN - PHARMACIST DOSING INPATIENT: Freq: Every day | Status: DC

## 2016-05-27 NOTE — Consult Note (Addendum)
Neurology Consultation Reason for Consult: Concern for stroke Referring Physician: Hal Hope, A  CC: Concern for stroke  History is obtained from: Patient, friend  HPI: Michele [name] is a 83 y.o. female he drove herself to [employer] today. While she was there, she is estimates about 2:30 to 3:00 she began feeling slightly dizzy. Due to this she sat down and around 5 PM employee noticed her and asked her but she said that she was okay. The employee noticed that she was still there on 5:30 PM and has to she could call and called a neighbor. On the neighbors arrival, she found her to be anxious, dizzy, not completely appropriate and therefore EMS was called.  On arrival, she said that her dizziness felt much improved, but she had a dense hemianopia. She actually did not recognize that she had any deficit. She denies any visual changes today.  When asked when she first felt dizzy today, she states that it was on awakening, but her neighbor states that this is normal for her and she often has to move slowly in the mornings because of "dizziness."   LKW: 2:30 pm tpa given?: no, outside of window    ROS: A 14 point ROS was performed and is negative except as noted in the HPI.   Past Medical History:  Diagnosis Date  . Atrial fibrillation ([place])    NEG STRESS CARDIOLITE  . C. difficile colitis   . Cardiomyopathy    RATE RELATED  . CHF (congestive heart failure) ([place])   . Diverticulitis    PMH of X 2  . Diverticulosis    DIVERTICULITIS  . Gilbert's syndrome   . Hx of colonic polyp 2011   Dr Fuller Plan  . Hyperlipidemia    NMR LIOPROFILE LDL 234(3206/2234)HDL 37,TG86  . Renal insufficiency 12/09/2015  . Thyroid disease    HYPOTHYROIDISM...THYROID NODULES     Family History  Problem Relation Age of Onset  . Heart attack Mother 8  . Heart attack Brother 29  . Lung disease Father     Silicosis  . Vasculitis Sister     Giant Cell Arteritis  . Ulcerative colitis Son      Social  History:  reports that she has never smoked. She has never used smokeless tobacco. She reports that she does not drink alcohol or use drugs.   Exam: Current vital signs: BP 138/83 (BP Location: Right Arm)   Pulse 75   Temp 97.5 F (36.4 C) (Oral)   Resp 18   Ht 5\' 3"  (1.6 m)   Wt 58.5 kg (128 lb 15.5 oz)   SpO2 98%   BMI 22.85 kg/m  Vital signs in last 24 hours: Temp:  [97.5 F (36.4 C)-97.8 F (36.6 C)] 97.5 F (36.4 C) (04/09 2330) Pulse Rate:  [66-75] 75 (04/10 0000) Resp:  [14-21] 18 (04/10 0000) BP: (129-168)/(75-97) 138/83 (04/10 0000) SpO2:  [96 %-100 %] 98 % (04/10 0000) Weight:  [58.5 kg (128 lb 15.5 oz)] 58.5 kg (128 lb 15.5 oz) (04/09 2330)   Physical Exam  Constitutional: Appears well-developed and well-nourished.  Psych: Affect appropriate to situation Eyes: No scleral injection HENT: No OP obstrucion Head: Normocephalic.  Cardiovascular: Normal rate and regular rhythm.  Respiratory: Effort normal and breath sounds normal to anterior ascultation GI: Soft.  No distension. There is no tenderness.  Skin: WDI  Neuro: Mental Status: Patient is awake, alert, oriented to person, place, month, year, and situation. Patient is able to give a clear and  coherent history. No signs of aphasia or neglect Cranial Nerves: II: Visual Fields are full. Pupils are equal, round, and reactive to light.   III,IV, VI: EOMI without ptosis or diploplia.  V: Facial sensation is symmetric to temperature VII: Facial movement is symmetric.  VIII: hearing is intact to voice X: Uvula elevates symmetrically XI: Shoulder shrug is symmetric. XII: tongue is midline without atrophy or fasciculations.  Motor: Tone is normal. Bulk is normal. 5/5 strength was present in all four extremities.  Sensory: She gives inconsistent answers to the sensory exam. Cerebellar: FNF and HKS are intact bilaterally  I have reviewed labs in epic and the results pertinent to this consultation are: Chem  8 - unremarkable  I have reviewed the images obtained: CT head-unremarkable  Impression: 83 year old female with what I suspect is a new left hemianopia. Given that her time of onset is actually unclear because she is unaware of her deficit, I would not favor IV TPA. In fact, she is able to state that she has had symptoms of dizziness since at least 2:30 PM. I would favor holding her Coumadin for now, with repeat head CT in the morning to look for the size of her infarct to determine further therapy.  Recommendations: 1. HgbA1c, fasting lipid panel 2. CTA head and neck if Cr improves 3. Frequent neuro checks 4. Echocardiogram 5. Carotid dopplers if Cr does not improve.  6. Prophylactic therapy-Antiplatelet med: Aspirin - dose 325mg  PO or 300mg  PR 7. Risk factor modification 8. Telemetry monitoring 9. PT consult, OT consult, Speech consult 10. please page stroke NP  Or  PA  Or MD  from 8am -4 pm starting 4/11 as this patient will be followed by the stroke team at this point.   You can look them up on [email or web address]      Roland Rack, MD [facility] [phone]  If 7pm- 7am, please page neurology on call as listed in [place].

## 2016-05-27 NOTE — Evaluation (Signed)
Occupational Therapy Evaluation Patient Details Name: SAMHITHA ROSEN MRN: [id number] DOB: [birth year] Today's Date: 05/27/2016    History of Present Illness Pt presented to the ED after being hound confused, dizzy, and [place]. In ED was found to have dense hemianopsia in L eye. CT unremarkable and unable to complete MRI as pt with pacemaker and defibrillator. PMH significant for chronic atrial fibrillation s/p AV ablation and pacemaker placement, tachycardia induced cardiomyopathy, hypothyroidism, and chronic kidney disease.   Clinical Impression   PTA, pt was living alone and was independent with all ADL and functional mobility. Pt currently requires overall supervision for safety with ADL tasks due to slight instability, dizziness, and mild confusion. During evaluation, pt was able to identify objects in L visual field accurately and able to utilize vision to navigate environment. Recommend pt refrain from driving until cleared by MD and opthamologist and plan to progress with functional visual assessment next session. Recommend home health OT services post-acute D/C to maximize independence with ADL and functional mobility. OT will continue to follow acutely with focus on ADL independence and functional use of vision.    Follow Up Recommendations  Home health OT;Supervision - Intermittent    Equipment Recommendations  3 in 1 bedside commode    Recommendations for Other Services       Precautions / Restrictions Precautions Precautions: Fall Precaution Comments: Slightly unsteady at times. Restrictions Weight Bearing Restrictions: No      Mobility Bed Mobility Overal bed mobility: Modified Independent                Transfers Overall transfer level: Needs assistance Equipment used: None Transfers: Sit to/from Stand Sit to Stand: Supervision         General transfer comment: Supervision for safety.     Balance Overall balance assessment: Needs  assistance Sitting-balance support: No upper extremity supported;Feet supported Sitting balance-Leahy Scale: Good     Standing balance support: No upper extremity supported;During functional activity;Single extremity supported Standing balance-Leahy Scale: Fair Standing balance comment: Pt with LOB at times reaching for wall for stability.                           ADL either performed or assessed with clinical judgement   ADL Overall ADL's : Needs assistance/impaired                                       General ADL Comments: Pt requires overall supervision for safety during ADL due to decreased awareness and problem solving deficits. She does reach for furniture during ambulation and reports dizziness and vertigo at baseline.     Vision Baseline Vision/History: Cataracts;Wears glasses (B eyes, surgery soon) Wears Glasses: Reading only (Reading and driving) Patient Visual Report: No change from baseline Vision Assessment?: Vision impaired- to be further tested in functional context Additional Comments: Per chart, pt with "dense L hemianopsia." During evaluation, pt able to identify items to the L and R with no difficulty navigating her environment. Peripheral and central vision appear in tact functionally but will continue to assess more formally next session.     Perception     Praxis      Pertinent Vitals/Pain Pain Assessment: No/denies pain     Hand Dominance Right   Extremity/Trunk Assessment Upper Extremity Assessment Upper Extremity Assessment: Overall WFL for tasks assessed   Lower  Extremity Assessment Lower Extremity Assessment: Overall WFL for tasks assessed       Communication Communication Communication: No difficulties   Cognition Arousal/Alertness: Awake/alert Behavior During Therapy: WFL for tasks assessed/performed Overall Cognitive Status: Impaired/Different from baseline Area of Impairment: Attention;Following  commands;Awareness;Problem solving                   Current Attention Level: Sustained   Following Commands: Follows one step commands with increased time   Awareness: Emergent Problem Solving: Slow processing;Difficulty sequencing General Comments: Pt often switching between tasks rapidly. Perseverating on attempting to remove tele monitor despite education to leave in place. Daughter present and reports pt much improved from previous day and no concerns with cognition.   General Comments       Exercises     Shoulder Instructions      Home Living Family/patient expects to be discharged to:: Private residence Living Arrangements: Alone   Type of Home: House Home Access: Stairs to enter [employer] of Steps: 4 at one entrance, 2 at another   Home Layout: Two level     Bathroom Shower/Tub: Teacher, early years/pre: Standard     Home Equipment: None          Prior Functioning/Environment Level of Independence: Independent        Comments: Drives and was completely independent.        OT Problem List: Decreased activity tolerance;Impaired balance (sitting and/or standing);Impaired vision/perception;Decreased safety awareness;Decreased knowledge of use of DME or AE;Decreased knowledge of precautions;Decreased cognition      OT Treatment/Interventions: Self-care/ADL training;Therapeutic exercise;Energy conservation;DME and/or AE instruction;Therapeutic activities;Patient/family education;Balance training    OT Goals(Current goals can be found in the care plan section) Acute Rehab OT Goals Patient Stated Goal: to go home OT Goal Formulation: With patient/family Time For Goal Achievement: 06/10/16 Potential to Achieve Goals: Good ADL Goals Pt Will Perform Grooming: with modified independence;standing Pt Will Transfer to Toilet: with modified independence;ambulating;regular height toilet Pt Will Perform Toileting - Clothing Manipulation  and hygiene: with modified independence;sit to/from stand Pt Will Perform Tub/Shower Transfer: with modified independence;Tub transfer;ambulating;3 in 1;shower seat Additional ADL Goal #1: Pt will identify 5/5 items in the L visual field with no more than 1 verbal cue to improve functional use of vision during ADL tasks.  OT Frequency: Min 2X/week   Barriers to D/C:            Co-evaluation              End of Session Equipment Utilized During Treatment: Gait belt Nurse Communication: Mobility status  Activity Tolerance: Patient tolerated treatment well Patient left: in chair;with call bell/phone within reach;with family/visitor present  OT Visit Diagnosis: Unsteadiness on feet (R26.81)                Time: 1610-9604 OT Time Calculation (min): 28 min Charges:  OT General Charges $OT Visit: 1 Procedure OT Evaluation $OT Eval Moderate Complexity: 1 Procedure OT Treatments $Self Care/Home Management : 8-22 mins G-Codes: OT G-codes **NOT FOR INPATIENT CLASS** Functional Assessment Tool Used: AM-PAC 6 Clicks Daily Activity Functional Limitation: Self care Self Care Current Status (V4098): At least 20 percent but less than 40 percent impaired, limited or restricted Self Care Goal Status (J1914): At least 1 percent but less than 20 percent impaired, limited or restricted   Norman Herrlich, [place] OTR/L  Pager: [place] 05/27/2016, 5:54 PM

## 2016-05-27 NOTE — Progress Notes (Signed)
STROKE TEAM PROGRESS NOTE   HISTORY OF PRESENT ILLNESS (per record) Michele [name] is a 83 y.o. female he drove herself to [employer] today. While she was there, she is estimates about 2:30 to 3:00 she began feeling slightly dizzy. Due to this she sat down and around 5 PM employee noticed her and asked her but she said that she was okay. The employee noticed that she was still there on 5:30 PM and has to she could call and called a neighbor. On the neighbors arrival, she found her to be anxious, dizzy, not completely appropriate and therefore EMS was called.  On arrival, she said that her dizziness felt much improved, but she had a dense hemianopia. She actually did not recognize that she had any deficit. She denies any visual changes today. When asked when she first felt dizzy today, she states that it was on awakening, but her neighbor states that this is normal for her and she often has to move slowly in the mornings because of "dizziness." She was LKW at 2:30 pm on 05/27/2016. Patient was not administered IV t-PA secondary to being outside of then window. She was admitted for further evaluation and treatment.   SUBJECTIVE (INTERVAL HISTORY) Daughter at bedside. Pt stated that she was in pharmacy store looking for medication and felt dizzy without vertigo, with nausea and headache at top of the head. The dizziness was not same as her BPPV in the past, which was vertigo without HA. Her neighbour saw her and felt she was somehow confused, tremulous and not acting herself. In ED, she was found to have left hemianopia. However, today pt said she is back to baseline and no visual changes. On exam, she does not have hemianopia.   OBJECTIVE Temp:  [97.5 F (36.4 C)-98.1 F (36.7 C)] 98.1 F (36.7 C) (04/10 0800) Pulse Rate:  [66-75] 72 (04/10 0800) Cardiac Rhythm: Ventricular paced (04/10 0700) Resp:  [14-21] 18 (04/10 0800) BP: (129-168)/(74-97) 166/87 (04/10 0800) SpO2:  [96 %-100 %] 98 % (04/10  0800) Weight:  [58.5 kg (128 lb 15.5 oz)] 58.5 kg (128 lb 15.5 oz) (04/09 2330)  CBC:  Recent Labs Lab 05/26/16 2026 05/26/16 2032 05/27/16 0617  WBC 5.5  --  6.1  NEUTROABS 3.1  --   --   HGB 12.9 12.2 12.8  HCT 37.3 36.0 37.4  MCV 86.5  --  86.2  PLT 154  --  884    Basic Metabolic Panel:  Recent Labs Lab 05/26/16 2026 05/26/16 2032 05/27/16 0617  NA 135 135 137  K 3.3* 3.2* 3.8  CL 102 102 105  CO2 22  --  23  GLUCOSE 94 94 87  BUN 22* 23* 15  CREATININE 1.28* 1.20* 1.09*  CALCIUM 9.1  --  8.9   Lipid Panel:    Component Value Date/Time   CHOL 229 (H) 05/27/2016 0617   CHOL 156 01/13/2014 1136   TRIG 70 05/27/2016 0617   TRIG 162 (H) 01/13/2014 1136   TRIG 100 12/16/2005 0804   HDL 44 05/27/2016 0617   HDL 44 01/13/2014 1136   CHOLHDL 5.2 05/27/2016 0617   VLDL 14 05/27/2016 0617   LDLCALC 171 (H) 05/27/2016 0617   LDLCALC 80 01/13/2014 1136   HgbA1c:  Lab Results  Component Value Date   HGBA1C 5.6 08/27/2007   Urine Drug Screen: No results found for: LABOPIA, COCAINSCRNUR, LABBENZ, AMPHETMU, THCU, LABBARB  Alcohol Level No results found for: [place] I have personally  reviewed the radiological images below and agree with the radiology interpretations.  Ct Head Code Stroke W/o Cm 05/26/2016 1. No acute intracranial process. 2. Progressed moderate to severe chronic small vessel ischemic disease. 3. ASPECTS is 10.    Ct Angio head and Neck W Or Wo Contrast 05/27/2016 IMPRESSION: CT HEAD: No acute intracranial process. Stable examination including moderate to severe chronic small vessel ischemic disease. CTA NECK: Minimal to mild atherosclerosis without hemodynamically significant stenosis or acute vascular process. CTA HEAD: No emergent large vessel occlusion or significant stenosis.   TTE pending  EEG pending   PHYSICAL EXAM  Temp:  [97.3 F (36.3 C)-98.1 F (36.7 C)] 97.7 F (36.5 C) (04/10 2118) Pulse Rate:  [70-75] 74 (04/10  2118) Resp:  [18-20] 18 (04/10 2118) BP: (104-166)/(64-97) 149/77 (04/10 2118) SpO2:  [96 %-100 %] 100 % (04/10 2118) Weight:  [128 lb 15.5 oz (58.5 kg)] 128 lb 15.5 oz (58.5 kg) (04/09 2330)  General - Well nourished, well developed, in no apparent distress.  Ophthalmologic - Fundi not visualized due to noncooperation.  Cardiovascular - Regular rate and rhythm.  Mental Status -  Level of arousal and orientation to time, place, and person were intact. Language including expression, naming, repetition, comprehension was assessed and found intact. Fund of Knowledge was assessed and was intact.  Cranial Nerves II - XII - II - Visual field intact OU. III, IV, VI - Extraocular movements intact. V - Facial sensation intact bilaterally. VII - Facial movement intact bilaterally. VIII - Hearing & vestibular intact bilaterally. X - Palate elevates symmetrically. XI - Chin turning & shoulder shrug intact bilaterally. XII - Tongue protrusion intact.  Motor Strength - The patient's strength was normal in all extremities and pronator drift was absent.  Bulk was normal and fasciculations were absent.   Motor Tone - Muscle tone was assessed at the neck and appendages and was normal.  Reflexes - The patient's reflexes were 1+ in all extremities and she had no pathological reflexes.  Sensory - Light touch, temperature/pinprick were assessed and were symmetrical.    Coordination - The patient had normal movements in the hands with no ataxia or dysmetria.  Tremor was absent.  Gait and Station - deferred    ASSESSMENT/PLAN [name] is a 83 y.o. female with history of AF, CM w/ CHF, HLD, diverticulosis, thyroid disease and renal insuffiency presenting with new left hemianopia. She did not receive IV t-PA due to delay in arrival.   Possible TIA vs. Complicated migraine. Less likely seizure. Risk factors for TIA include afib, HLD, CHF, but her INR was therapeutic on admission. Pt does  have HA, visual disturbance and dizziness could be related to HA but pt has no hx of migraine.   Resultant  Deficit resolved  Code Stroke CT no acute stroke. Mod to severe small vessel disease. Aspects 10.    CTA H & N unremarkable  Repeat CT no acute abnormalities  2D Echo  Pending  EEG pending  LDL 171  HgbA1c pending  Warfarin for VTE prophylaxis  Diet Heart Room service appropriate? Yes; Fluid consistency: Thin  warfarin daily prior to admission, now on aspirin 325 mg daily. Will resume warfarin. Discussed with pt regarding switch to NOACs but she would like continue warfarin.  Patient counseled to be compliant with her antithrombotic medications  Ongoing aggressive stroke risk factor management  Therapy recommendations:  pending   Disposition:  pending   Atrial Fibrillation  Home anticoagulation:  warfarin  daily continued in the hospital  INR 2.17 on admission  Discussed with pt regarding switch to NOACs to obtain stable levels, but she declined switch and would like continue warfarin.   Will resume coumadin tonight.  Hypertension  Stable  Permissive hypertension (OK if < 180/105) but gradually normalize in 5-7 days  Long-term BP goal normotensive  Hyperlipidemia  Home meds:  brand name vytorin  LDL 171, goal < 70  Continue brand name vytorin at discharge  Other Stroke Risk Factors  Advanced age  Tachycardia induced cardiomyopathy w/ EF 60-65% in 2016.   Other Active Problems  CKD stage III, Cre 1.20 ->1.09  Hypothyroidism   Hospital day # 0  Rosalin Hawking, MD PhD Stroke Neurology 05/27/2016 10:10 PM   To contact Stroke Continuity provider, please refer to [email or web address] After hours, contact General Neurology

## 2016-05-27 NOTE — Progress Notes (Signed)
ANTICOAGULATION CONSULT NOTE - Initial Consult  Pharmacy Consult for Coumadin Indication: atrial fibrillation  Allergies  Allergen Reactions  . Cephalexin Hives  . Erythromycin Swelling and Other (See Comments)    REACTION: swollen and sore mouth  . Terbinafine Hcl     Rash   . Colchicine Other (See Comments)    REACTION: violent diarrhea  . Rosuvastatin Other (See Comments)    REACTION: upper arm pain bilaterally  . Vytorin [Ezetimibe-Simvastatin] Other (See Comments)    Nightmares with generic    Patient Measurements: Height: 5\' 3"  (160 cm) Weight: 128 lb 15.5 oz (58.5 kg) IBW/kg (Calculated) : 52.4  Vital Signs: Temp: 97.5 F (36.4 C) (04/09 2330) Temp Source: Oral (04/09 2330) BP: 138/83 (04/10 0000) Pulse Rate: 75 (04/10 0000)  Labs:  Recent Labs  05/26/16 2026 05/26/16 2032  HGB 12.9 12.2  HCT 37.3 36.0  PLT 154  --   APTT 46*  --   LABPROT 24.6*  --   INR 2.17  --   CREATININE 1.28* 1.20*    Estimated Creatinine Clearance: 28.9 mL/min (A) (by C-G formula based on SCr of 1.2 mg/dL (H)).   Medical History: Past Medical History:  Diagnosis Date  . Atrial fibrillation ([place])    NEG STRESS CARDIOLITE  . C. difficile colitis   . Cardiomyopathy    RATE RELATED  . CHF (congestive heart failure) ([place])   . Diverticulitis    PMH of X 2  . Diverticulosis    DIVERTICULITIS  . Gilbert's syndrome   . Hx of colonic polyp 2011   Dr Fuller Plan  . Hyperlipidemia    NMR LIOPROFILE LDL 234(3206/2234)HDL 37,TG86  . Renal insufficiency 12/09/2015  . Thyroid disease    HYPOTHYROIDISM...THYROID NODULES    Medications:  Prescriptions Prior to Admission  Medication Sig Dispense Refill Last Dose  . cholecalciferol (VITAMIN D) 1000 UNITS tablet Take 1,000 Units by mouth daily.   05/26/2016 at Unknown time  . co-enzyme Q-10 30 MG capsule Take 30 mg by mouth daily.   05/26/2016 at Unknown time  . furosemide (LASIX) 20 MG tablet Take 1 tablet (20 mg total) by mouth as  needed. (Patient taking differently: Take 20 mg by mouth as needed for fluid. ) 30 tablet 1 Past Month at Unknown time  . warfarin (COUMADIN) 5 MG tablet TAKE AS DIRECTED. (Patient taking differently: Take 2.5-5 mg by mouth See admin instructions. Take 1/2 tablet (2.5 mg) on Monday and Friday then take 1 tablet (5 mg) all the other days) 30 tablet 3 05/25/2016 at 2300  . atorvastatin (LIPITOR) 20 MG tablet Take 1 tablet (20 mg total) by mouth daily. 90 tablet 3   . levothyroxine (SYNTHROID, LEVOTHROID) 88 MCG tablet Take 1 tablet (88 mcg total) by mouth daily. 90 tablet 3    Scheduled:  .  stroke: mapping our early stages of recovery book   Does not apply Once  . atorvastatin  20 mg Oral Daily  . cholecalciferol  1,000 Units Oral Daily  . levothyroxine  88 mcg Oral QAC breakfast  . sodium chloride  250 mL Intravenous Once   Infusions:  . sodium chloride 75 mL/hr at 05/27/16 0113    Assessment: 83yo female presents as code stroke, abnormal behavior improved in ED, admitted for further w/u, to continue Coumadin for Afib; current INR at goal w/ last dose of Coumadin taken 4/8.  Goal of Therapy:  INR 2-3   Plan:  Will continue home Coumadin dose of 5mg  TWTSS  and 2.5mg  MF and monitor INR.  Wynona Neat, PharmD, BCPS  05/27/2016,1:19 AM

## 2016-05-27 NOTE — H&P (Signed)
History and Physical    Michele [name]:[id number] DOB: October 29, 1929 DOA: 05/26/2016  PCP: Cathlean Cower, MD  Patient coming from: Home.  Chief Complaint: Weakness. Confusion.  HPI: Michele [name] is a 83 y.o. female with history of chronic atrial fibrillation status post AV ablation and pacemaker placement, tachycardia induced cardiomyopathy which has improved, hypothyroidism and chronic kidney disease was brought to the ER the patient was found to be confused as the shopping center. Patient went to the shopping center around 2 to 2:30 PM yesterday and since then patient was feeling dizzy. Shop Therapist, nutritional helped her to sit. Patient remained confused and dizzy and patient's neighbor tried to help her. Since patient's symptoms continued EMS was called and brought to the ER.  ED Course: In the ER patient was found to have dense hemianopsia on the left eye. CT head was unremarkable. Since patient has pacemaker and defibrillator but I was not able to be done. Patient is being admitted for further stroke workup. Patient's confusion has improved after arrival. Denies any difficulty swallowing or speaking at this time. Patient isn't able to move all extremities 5 x 5. Patient's INR is therapeutic.  Review of Systems: As per HPI, rest all negative.   Past Medical History:  Diagnosis Date  . Atrial fibrillation ([place])    NEG STRESS CARDIOLITE  . C. difficile colitis   . Cardiomyopathy    RATE RELATED  . CHF (congestive heart failure) ([place])   . Diverticulitis    PMH of X 2  . Diverticulosis    DIVERTICULITIS  . Gilbert's syndrome   . Hx of colonic polyp 2011   Dr Fuller Plan  . Hyperlipidemia    NMR LIOPROFILE LDL 234(3206/2234)HDL 37,TG86  . Renal insufficiency 12/09/2015  . Thyroid disease    HYPOTHYROIDISM...THYROID NODULES    Past Surgical History:  Procedure Laterality Date  . ABDOMINAL HYSTERECTOMY  1973  . APPENDECTOMY  1956  . BIOPSY THYROID     SINGLE NODULE  . BREAST BIOPSY  1997    . CARDIAC DEFIBRILLATOR PLACEMENT    . CHOLECYSTECTOMY  1972  . colonoscopy with polypectomy  2011  . EP IMPLANTABLE DEVICE N/A 11/21/2015   Procedure: BIV ICD Generator Changeout;  Surgeon: Deboraha Sprang, MD;  Location: [facility];  Service: Cardiovascular;  Laterality: N/A;  . ICD....02/2008    . LEAD REVISION N/A 08/21/2011   Procedure: LEAD REVISION;  Surgeon: Deboraha Sprang, MD;  Location: [facility] CATH LAB;  Service: Cardiovascular;  Laterality: N/A;  . PACEMAKER PLACEMENT     10/2006  . ROTATOR CUFF REPAIR     x2  . THROIDECTOMY 12/08     benign     reports that she has never smoked. She has never used smokeless tobacco. She reports that she does not drink alcohol or use drugs.  Allergies  Allergen Reactions  . Cephalexin Hives  . Erythromycin Swelling and Other (See Comments)    REACTION: swollen and sore mouth  . Terbinafine Hcl     Rash   . Colchicine Other (See Comments)    REACTION: violent diarrhea  . Rosuvastatin Other (See Comments)    REACTION: upper arm pain bilaterally  . Vytorin [Ezetimibe-Simvastatin] Other (See Comments)    Nightmares with generic    Family History  Problem Relation Age of Onset  . Heart attack Mother 59  . Heart attack Brother 28  . Lung disease Father     Silicosis  . Vasculitis Sister  Giant Cell Arteritis  . Ulcerative colitis Son     Prior to Admission medications   Medication Sig Start Date End Date Taking? Authorizing Provider  cholecalciferol (VITAMIN D) 1000 UNITS tablet Take 1,000 Units by mouth daily.   Yes Historical Provider, MD  co-enzyme Q-10 30 MG capsule Take 30 mg by mouth daily.   Yes Historical Provider, MD  furosemide (LASIX) 20 MG tablet Take 1 tablet (20 mg total) by mouth as needed. Patient taking differently: Take 20 mg by mouth as needed for fluid.  01/07/16  Yes Biagio Borg, MD  warfarin (COUMADIN) 5 MG tablet TAKE AS DIRECTED. Patient taking differently: Take 2.5-5 mg by mouth See admin  instructions. Take 1/2 tablet (2.5 mg) on Monday and Friday then take 1 tablet (5 mg) all the other days 01/07/16  Yes Biagio Borg, MD  atorvastatin (LIPITOR) 20 MG tablet Take 1 tablet (20 mg total) by mouth daily. 05/20/16 05/20/17  Biagio Borg, MD  levothyroxine (SYNTHROID, LEVOTHROID) 88 MCG tablet Take 1 tablet (88 mcg total) by mouth daily. 05/21/16   Biagio Borg, MD    Physical Exam: Vitals:   05/26/16 2145 05/26/16 2221 05/26/16 2330 05/27/16 0000  BP: (!) 168/95 (!) 148/97 (!) 161/75 138/83  Pulse: 70 70 70 75  Resp: 19 18 20 18   Temp:  97.8 F (36.6 C) 97.5 F (36.4 C)   TempSrc:  Oral Oral   SpO2: 100% 100% 98% 98%  Weight:   58.5 kg (128 lb 15.5 oz)   Height:   5\' 3"  (1.6 m)       Constitutional: Moderately built and nourished. Vitals:   05/26/16 2145 05/26/16 2221 05/26/16 2330 05/27/16 0000  BP: (!) 168/95 (!) 148/97 (!) 161/75 138/83  Pulse: 70 70 70 75  Resp: 19 18 20 18   Temp:  97.8 F (36.6 C) 97.5 F (36.4 C)   TempSrc:  Oral Oral   SpO2: 100% 100% 98% 98%  Weight:   58.5 kg (128 lb 15.5 oz)   Height:   5\' 3"  (1.6 m)    Eyes: Anicteric no pallor. ENMT: No discharge from the ears eyes nose or mouth. Neck: No JVD appreciated no mass felt. Respiratory: No rhonchi or crepitations. Cardiovascular: S1 and S2 heard no murmurs appreciated. Abdomen: Soft nontender bowel sounds present. Musculoskeletal: No edema. No joint effusion. Skin: No rash. Skin appears warm. Neurologic: Alert awake oriented to time place and person. Moves all extremities 5 x 5. No facial asymmetry. Tongue is midline. Has mild difficulty seeing and left peripheral vision. Pupils are reacting to light. Psychiatric: Appears normal. Normal affect.   Labs on Admission: I have personally reviewed following labs and imaging studies  CBC:  Recent Labs Lab 05/26/16 2026 05/26/16 2032  WBC 5.5  --   NEUTROABS 3.1  --   HGB 12.9 12.2  HCT 37.3 36.0  MCV 86.5  --   PLT 154  --    Basic  Metabolic Panel:  Recent Labs Lab 05/20/16 1651 05/26/16 2026 05/26/16 2032  NA 136 135 135  K 4.3 3.3* 3.2*  CL 102 102 102  CO2 28 22  --   GLUCOSE 97 94 94  BUN 21 22* 23*  CREATININE 1.22* 1.28* 1.20*  CALCIUM 9.4 9.1  --    GFR: Estimated Creatinine Clearance: 28.9 mL/min (A) (by C-G formula based on SCr of 1.2 mg/dL (H)). Liver Function Tests:  Recent Labs Lab 05/26/16 2026  AST 24  ALT 16  ALKPHOS 55  BILITOT 1.6*  PROT 6.3*  ALBUMIN 3.7   No results for input(s): LIPASE, AMYLASE in the last 168 hours. No results for input(s): AMMONIA in the last 168 hours. Coagulation Profile:  Recent Labs Lab 05/26/16 2026  INR 2.17   Cardiac Enzymes: No results for input(s): CKTOTAL, CKMB, CKMBINDEX, TROPONINI in the last 168 hours. BNP (last 3 results) No results for input(s): PROBNP in the last 8760 hours. HbA1C: No results for input(s): HGBA1C in the last 72 hours. CBG:  Recent Labs Lab 05/26/16 2016  GLUCAP 87   Lipid Profile: No results for input(s): CHOL, HDL, LDLCALC, TRIG, CHOLHDL, LDLDIRECT in the last 72 hours. Thyroid Function Tests: No results for input(s): TSH, T4TOTAL, FREET4, T3FREE, THYROIDAB in the last 72 hours. Anemia Panel: No results for input(s): VITAMINB12, FOLATE, FERRITIN, TIBC, IRON, RETICCTPCT in the last 72 hours. Urine analysis:    Component Value Date/Time   COLORURINE YELLOW 12/07/2015 [place] 12/07/2015 1231   LABSPEC 1.010 12/07/2015 1231   PHURINE 6.0 12/07/2015 1231   GLUCOSEU NEGATIVE 12/07/2015 1231   HGBUR TRACE-LYSED (A) 12/07/2015 1231   BILIRUBINUR NEGATIVE 12/07/2015 1231   BILIRUBINUR negative 09/16/2013 1358   KETONESUR NEGATIVE 12/07/2015 1231   PROTEINUR 30+ 09/16/2013 1358   PROTEINUR NEGATIVE 06/01/2013 0633   UROBILINOGEN 0.2 12/07/2015 1231   NITRITE NEGATIVE 12/07/2015 1231   LEUKOCYTESUR NEGATIVE 12/07/2015 1231   Sepsis Labs: @LABRCNTIP (procalcitonin:4,lacticidven:4) )No  results found for this or any previous visit (from the past 240 hour(s)).   Radiological Exams on Admission: Ct Head Code Stroke W/o Cm  Result Date: 05/26/2016 CLINICAL DATA:  Code stroke. Fall, weakness, speech difficulty. History of atrial fibrillation, AICD. EXAM: CT HEAD WITHOUT CONTRAST TECHNIQUE: Contiguous axial images were obtained from the base of the skull through the vertex without intravenous contrast. COMPARISON:  CT HEAD October 18, 2013 FINDINGS: BRAIN: No intraparenchymal hemorrhage, mass effect nor midline shift. The ventricles and sulci are normal for age. Patchy to confluent supratentorial white matter hypodensities increased from stick prior study. No acute large vascular territory infarcts. No abnormal extra-axial fluid collections. Basal cisterns are patent. VASCULAR: Moderate calcific atherosclerosis of the carotid siphons. SKULL: No skull fracture. No significant scalp soft tissue swelling. SINUSES/ORBITS: The mastoid air-cells and included paranasal sinuses are well-aerated.The included ocular globes and orbital contents are non-suspicious. OTHER: None. ASPECTS [facility] Stroke Program Early CT Score) - Ganglionic level infarction (caudate, lentiform nuclei, internal capsule, insula, M1-M3 cortex): 7 - Supraganglionic infarction (M4-M6 cortex): 3 Total score (0-10 with 10 being normal): 10 IMPRESSION: 1. No acute intracranial process. 2. Progressed moderate to severe chronic small vessel ischemic disease. 3. ASPECTS is 10. Critical Value/emergent results were called by telephone at the time of interpretation on 05/26/2016 at 8:37 pm to Dr. Carmin Muskrat , who verbally acknowledged these results. Electronically Signed   By: Elon Alas M.D.   On: 05/26/2016 20:38    EKG: Independently reviewed. Paced rhythm.  Assessment/Plan Principal Problem:   Acute embolic stroke [facility]) Active Problems:   Cardiomyopathy, secondary ([place])   ATRIAL FIBRILLATION   Automatic implantable  cardioverter-defibrillator in situ   Stroke (cerebrum) ([place])    1. Acute stroke likely embolic - discussed with Dr. Leonel Ramsay on call neurologist who at this time advised patient to be on aspirin and hold off Coumadin. If patient's creatinine improves in the morning may have CT angiogram of the head and neck otherwise repeat CT head without contrast. Restarting of  anticoagulation per neurology. If CT angiography neck is not done then carotid Doppler has been ordered. Check 2-D echo hemoglobin A1c and lipid panel. 2. Atrial fibrillation status post AV nodal ablation and pacemaker placement - Coumadin on hold for neurology. 3. History of tachycardia induced cardiomyopathy improved last EF measured in 2016 was 60-65% - holding of Lasix due to stroke. 4. Chronic kidney disease stage III - recheck creatinine in a.m. Is still elevated may have to get CT head plane without contrast. At that time and her daughter Dopplers of the carotid.  5. Hypothyroidism on Synthroid.   DVT prophylaxis: Patient is therapeutic at this time. Lovenox if INR less than 2. Code Status: Full code.  Family Communication: Patient's daughter.  Disposition Plan: Home.  Consults called: Neurology.  Admission status: Observation.    Rise Patience MD [facility] Pager [phone].  If 7PM-7AM, please contact night-coverage [email or web address] Password [email or web address]  05/27/2016, 2:08 AM

## 2016-05-27 NOTE — Care Management Note (Signed)
Case Management Note  Patient Details  Name: TIYAH ZELENAK MRN: [id number] Date of Birth: 03-25-1929  Subjective/Objective:   Pt in to r/o CVA. She is from home alone.                  Action/Plan: Awaiting PT/OT recommendations. CM following for d/c needs, physician orders.   Expected Discharge Date:                  Expected Discharge Plan:     In-House Referral:     Discharge planning Services     Post Acute Care Choice:    Choice offered to:     DME Arranged:    DME Agency:     HH Arranged:    HH Agency:     Status of Service:  In process, will continue to follow  If discussed at Long Length of Stay Meetings, dates discussed:    Additional Comments:  Pollie Friar, RN 05/27/2016, 1:46 PM

## 2016-05-27 NOTE — Progress Notes (Signed)
Have seen and assessed patient and neck with Dr.Kakrakandy's assessment and plan. Patient is a pleasant 83 year old female history of chronic atrial fibrillation status post AV ablation and pacemaker placement, tachycardia induced cardiomyopathy, hypothyroidism, chronic kidney disease presented to the ED with confusion and dizziness and dense left hemianopsia.. Patient noted to have a therapeutic INR. Patient would likely acute embolic stroke. Patient for CT angiogram head and neck, 2-D echo, hemoglobin A1c, fasting lipid panel, stroke workup. Patient currently on aspirin per neurology recommendations. Neurology following.  No charge.

## 2016-05-27 NOTE — Progress Notes (Signed)
OT Cancellation Note  Patient Details Name: Michele [name] MRN: [id number] DOB: 1929-11-23   Cancelled Treatment:    Reason Eval/Treat Not Completed: Medical issues which prohibited therapy. RN requesting OT check back at later time for evaluation as pt with low BP this am and awaiting bolus of fluid. Will check back as able and appropriate.  Norman Herrlich, MS OTR/L  Pager: [place] A [name] 05/27/2016, 11:23 AM

## 2016-05-27 NOTE — Progress Notes (Signed)
PT Cancellation Note  Patient Details Name: Michele [name] MRN: [id number] DOB: May 22, 1929   Cancelled Treatment:    Reason Eval/Treat Not Completed: Patient at procedure or test/unavailable. Pt currently off unit for testing. Will continue to follow and complete PT eval when able.    Thelma Comp 05/27/2016, 2:49 PM   Rolinda Roan, PT, DPT Acute Rehabilitation Services Pager: [phone]

## 2016-05-27 NOTE — Progress Notes (Signed)
ANTICOAGULATION CONSULT NOTE - Initial Consult  Pharmacy Consult for warfarin Indication: atrial fibrillation  Allergies  Allergen Reactions  . Cephalexin Hives  . Erythromycin Swelling and Other (See Comments)    REACTION: swollen and sore mouth  . Terbinafine Hcl     Rash   . Colchicine Other (See Comments)    REACTION: violent diarrhea  . Rosuvastatin Other (See Comments)    REACTION: upper arm pain bilaterally  . Vytorin [Ezetimibe-Simvastatin] Other (See Comments)    Nightmares with generic    Patient Measurements: Height: 5\' 3"  (160 cm) Weight: 128 lb 15.5 oz (58.5 kg) IBW/kg (Calculated) : 52.4  Vital Signs: Temp: 98.1 F (36.7 C) (04/10 1742) Temp Source: Oral (04/10 1742) BP: 109/64 (04/10 1742) Pulse Rate: 73 (04/10 1742)  Labs:  Recent Labs  05/26/16 2026 05/26/16 2032 05/27/16 0617  HGB 12.9 12.2 12.8  HCT 37.3 36.0 37.4  PLT 154  --  161  APTT 46*  --   --   LABPROT 24.6*  --  23.1*  INR 2.17  --  2.01  CREATININE 1.28* 1.20* 1.09*    Estimated Creatinine Clearance: 31.8 mL/min (A) (by C-G formula based on SCr of 1.09 mg/dL (H)).   Medical History: Past Medical History:  Diagnosis Date  . Atrial fibrillation ([place])    NEG STRESS CARDIOLITE  . C. difficile colitis   . Cardiomyopathy    RATE RELATED  . CHF (congestive heart failure) ([place])   . Diverticulitis    PMH of X 2  . Diverticulosis    DIVERTICULITIS  . Gilbert's syndrome   . Hx of colonic polyp 2011   Dr Fuller Plan  . Hyperlipidemia    NMR LIOPROFILE LDL 234(3206/2234)HDL 37,TG86  . Renal insufficiency 12/09/2015  . Thyroid disease    HYPOTHYROIDISM...THYROID NODULES     Assessment: 83 yo female on warfarin prior to admission for atrial fibrillation admitted 4/9 with acute CVA. Warfarin initially held but to resume this evening per neurology. INR 2.01 with last dose on 04/08, CBC stable.  Noted plans to discontinue Aspirin if INR remains therapeutic (concern it may drop with  missed dose on 4/9).    Goal of Therapy:  INR 2-3 Monitor platelets by anticoagulation protocol: Yes   Plan:  1. Warfarin 5 mg x 1 now 2. Daily INR 3. Will leave Aspirin on for now but plan to discontinue if INR therapeutic in am   Vincenza Hews, PharmD, BCPS 05/27/2016, 8:33 PM

## 2016-05-27 NOTE — Progress Notes (Signed)
PT Cancellation Note  Patient Details Name: Michele [name] MRN: [id number] DOB: 1929/04/19   Cancelled Treatment:    Reason Eval/Treat Not Completed: Medical issues which prohibited therapy. Pt's BP is running lower 104/60 and RN requests PT hold off until later in the afternoon.    Scheryl Marten PT, DPT  [phone]  05/27/2016, 10:57 AM

## 2016-05-28 ENCOUNTER — Observation Stay (HOSPITAL_BASED_OUTPATIENT_CLINIC_OR_DEPARTMENT_OTHER): Payer: Medicare Other

## 2016-05-28 ENCOUNTER — Observation Stay (HOSPITAL_BASED_OUTPATIENT_CLINIC_OR_DEPARTMENT_OTHER)
Admit: 2016-05-28 | Discharge: 2016-05-28 | Disposition: A | Discharge status: Home / Self Care | Payer: Medicare Other | Attending: Neurology | Admitting: Neurology

## 2016-05-28 DIAGNOSIS — G450 Vertebro-basilar artery syndrome: Secondary | ICD-10-CM

## 2016-05-28 DIAGNOSIS — I6340 Cerebral infarction due to embolism of unspecified cerebral artery: Secondary | ICD-10-CM

## 2016-05-28 DIAGNOSIS — H53462 Homonymous bilateral field defects, left side: Secondary | ICD-10-CM | POA: Diagnosis not present

## 2016-05-28 DIAGNOSIS — I480 Paroxysmal atrial fibrillation: Secondary | ICD-10-CM | POA: Diagnosis not present

## 2016-05-28 LAB — BASIC METABOLIC PANEL
Anion gap: 9 (ref 5–15)
BUN: 18 mg/dL (ref 6–20)
CO2: 23 mmol/L (ref 22–32)
Calcium: 9.2 mg/dL (ref 8.9–10.3)
Chloride: 107 mmol/L (ref 101–111)
Creatinine, Ser: 1.04 mg/dL — ABNORMAL HIGH (ref 0.44–1.00)
GFR calc Af Amer: 56 mL/min — ABNORMAL LOW (ref >60)
GFR calc non Af Amer: 48 mL/min — ABNORMAL LOW (ref >60)
Glucose, Bld: 83 mg/dL (ref 65–99)
Potassium: 3.9 mmol/L (ref 3.5–5.1)
Sodium: 139 mmol/L (ref 135–145)

## 2016-05-28 LAB — ECHOCARDIOGRAM COMPLETE
HEIGHTINCHES: 63 in
Weight: 2063.51 oz

## 2016-05-28 LAB — CBC
HCT: 40.1 % (ref 36.0–46.0)
Hemoglobin: 13.6 g/dL (ref 12.0–15.0)
MCH: 29.8 pg (ref 26.0–34.0)
MCHC: 33.9 g/dL (ref 30.0–36.0)
MCV: 87.9 fL (ref 78.0–100.0)
Platelets: 161 10*3/uL (ref 150–400)
RBC: 4.56 MIL/uL (ref 3.87–5.11)
RDW: 14 % (ref 11.5–15.5)
WBC: 5 10*3/uL (ref 4.0–10.5)

## 2016-05-28 LAB — HEMOGLOBIN A1C
Hgb A1c MFr Bld: 5.5 % (ref 4.8–5.6)
Mean Plasma Glucose: 111 mg/dL

## 2016-05-28 LAB — PROTIME-INR
INR: 1.92
Prothrombin Time: 22.2 seconds — ABNORMAL HIGH (ref 11.4–15.2)

## 2016-05-28 MED ORDER — ATORVASTATIN CALCIUM 20 MG PO TABS: 40.0000 mg | ORAL_TABLET | Freq: Every day | ORAL | 0 refills | Status: DC

## 2016-05-28 MED ORDER — WARFARIN SODIUM 5 MG PO TABS
5.0000 mg | ORAL_TABLET | Freq: Once | ORAL | Status: AC
  Administered 2016-05-28: 5 mg via ORAL
  Filled 2016-05-28: qty 1

## 2016-05-28 NOTE — Care Management Note (Signed)
Case Management Note  Patient Details  Name: Michele [name] MRN: [id number] Date of Birth: 1930-01-25  Subjective/Objective:                    Action/Plan: Pt discharging home with self care. OT recommending 3 in 1. Patient refused. Patients daughter very supportive and going to provide transportation home.   Expected Discharge Date:  05/28/16               Expected Discharge Plan:  Home/Self Care  In-House Referral:     Discharge planning Services     Post Acute Care Choice:    Choice offered to:     DME Arranged:    DME Agency:     HH Arranged:    HH Agency:     Status of Service:  Completed, signed off  If discussed at [employer] of Stay Meetings, dates discussed:    Additional Comments:  Pollie Friar, RN 05/28/2016, 5:03 PM

## 2016-05-28 NOTE — Progress Notes (Signed)
Pt discharged home. Discharge instructions were reviewed with pt & daughter. Pt and Daughter verbalized understanding.

## 2016-05-28 NOTE — Progress Notes (Signed)
  Echocardiogram 2D Echocardiogram has been performed.  Michele [name] [name] 05/28/2016, 5:29 PM

## 2016-05-28 NOTE — Progress Notes (Signed)
EEG completed, results pending. 

## 2016-05-28 NOTE — Care Management Obs Status (Signed)
[place] NOTIFICATION   Patient Details  Name: MAILEN NEWBORN MRN: [id number] Date of Birth: Jan 20, 1930   Medicare Observation Status Notification Given:  Yes (pt refused to sign)    Pollie Friar, RN 05/28/2016, 2:57 PM

## 2016-05-28 NOTE — Progress Notes (Signed)
STROKE TEAM PROGRESS NOTE   SUBJECTIVE (INTERVAL HISTORY) Daughter is at bedside. Pt no acute issue overnight. No complains. Had EEG which is normal. Coumadin resumed and INR today 1.92.   OBJECTIVE Temp:  [97.7 F (36.5 C)-98.1 F (36.7 C)] 97.8 F (36.6 C) (04/11 0522) Pulse Rate:  [73-107] 107 (04/11 0522) Cardiac Rhythm: Ventricular paced (04/11 0704) Resp:  [18-20] 20 (04/11 0522) BP: (109-149)/(64-77) 137/72 (04/11 0522) SpO2:  [98 %-100 %] 98 % (04/11 0522)  CBC:  Recent Labs Lab 05/26/16 2026  05/27/16 0617 05/28/16 0456  WBC 5.5  --  6.1 5.0  NEUTROABS 3.1  --   --   --   HGB 12.9  < > 12.8 13.6  HCT 37.3  < > 37.4 40.1  MCV 86.5  --  86.2 87.9  PLT 154  --  161 161  < > = values in this interval not displayed.  Basic Metabolic Panel:   Recent Labs Lab 05/27/16 0617 05/28/16 0456  NA 137 139  K 3.8 3.9  CL 105 107  CO2 23 23  GLUCOSE 87 83  BUN 15 18  CREATININE 1.09* 1.04*  CALCIUM 8.9 9.2   Lipid Panel:     Component Value Date/Time   CHOL 229 (H) 05/27/2016 0617   CHOL 156 01/13/2014 1136   TRIG 70 05/27/2016 0617   TRIG 162 (H) 01/13/2014 1136   TRIG 100 12/16/2005 0804   HDL 44 05/27/2016 0617   HDL 44 01/13/2014 1136   CHOLHDL 5.2 05/27/2016 0617   VLDL 14 05/27/2016 0617   LDLCALC 171 (H) 05/27/2016 0617   LDLCALC 80 01/13/2014 1136   HgbA1c:  Lab Results  Component Value Date   HGBA1C 5.5 05/27/2016   Urine Drug Screen: No results found for: LABOPIA, COCAINSCRNUR, LABBENZ, AMPHETMU, THCU, LABBARB  Alcohol Level No results found for: [place] I have personally reviewed the radiological images below and agree with the radiology interpretations.  Ct Head Code Stroke W/o Cm 05/26/2016 1. No acute intracranial process. 2. Progressed moderate to severe chronic small vessel ischemic disease. 3. ASPECTS is 10.    Ct Angio head and Neck W Or Wo Contrast 05/27/2016 IMPRESSION: CT HEAD: No acute intracranial process. Stable  examination including moderate to severe chronic small vessel ischemic disease. CTA NECK: Minimal to mild atherosclerosis without hemodynamically significant stenosis or acute vascular process. CTA HEAD: No emergent large vessel occlusion or significant stenosis.   TTE pending  EEG normal EEG   PHYSICAL EXAM  Temp:  [97.7 F (36.5 C)-98.1 F (36.7 C)] 97.8 F (36.6 C) (04/11 0522) Pulse Rate:  [73-107] 107 (04/11 0522) Resp:  [18-20] 20 (04/11 0522) BP: (109-149)/(64-77) 137/72 (04/11 0522) SpO2:  [98 %-100 %] 98 % (04/11 0522)  General - Well nourished, well developed, in no apparent distress.  Ophthalmologic - Fundi not visualized due to noncooperation.  Cardiovascular - Regular rate and rhythm.  Mental Status -  Level of arousal and orientation to time, place, and person were intact. Language including expression, naming, repetition, comprehension was assessed and found intact. Fund of Knowledge was assessed and was intact.  Cranial Nerves II - XII - II - Visual field intact OU. III, IV, VI - Extraocular movements intact. V - Facial sensation intact bilaterally. VII - Facial movement intact bilaterally. VIII - Hearing & vestibular intact bilaterally. X - Palate elevates symmetrically. XI - Chin turning & shoulder shrug intact bilaterally. XII - Tongue protrusion intact.  Motor Strength - The  patient's strength was normal in all extremities and pronator drift was absent.  Bulk was normal and fasciculations were absent.   Motor Tone - Muscle tone was assessed at the neck and appendages and was normal.  Reflexes - The patient's reflexes were 1+ in all extremities and she had no pathological reflexes.  Sensory - Light touch, temperature/pinprick were assessed and were symmetrical.    Coordination - The patient had normal movements in the hands with no ataxia or dysmetria.  Tremor was absent.  Gait and Station - deferred    ASSESSMENT/PLAN Ms. TELIYAH ROYAL is a  83 y.o. female with history of AF, CM w/ CHF, HLD, diverticulosis, thyroid disease and renal insuffiency presenting with new left hemianopia. She did not receive IV t-PA due to delay in arrival.   Possible TIA vs. Complicated migraine. Less likely seizure. Risk factors for TIA include afib, HLD, CHF, but her INR was therapeutic on admission. Pt does have HA, visual disturbance and dizziness could be related to HA but pt has no hx of migraine.   Resultant  Deficit resolved  Code Stroke CT no acute stroke. Mod to severe small vessel disease. Aspects 10.    CTA H & N unremarkable  Repeat CT no acute abnormalities  2D Echo  Pending  EEG normal EEG  LDL 171  HgbA1c 5.5  Warfarin for VTE prophylaxis Diet Heart Room service appropriate? Yes; Fluid consistency: Thin Diet - low sodium heart healthy  warfarin daily prior to admission, now on aspirin 325 mg daily. Resumed warfarin. Discussed with pt regarding switch to NOACs but she would like continue warfarin.  Patient counseled to be compliant with her antithrombotic medications  Ongoing aggressive stroke risk factor management  Therapy recommendations:  pending   Disposition:  pending   Atrial Fibrillation  Home anticoagulation:  warfarin daily continued in the hospital  INR 2.17 on admission  Discussed with pt regarding switch to NOACs to obtain stable levels, but she declined switch and would like continue warfarin.   coumadin resumed and INR today 1.92.  Hypertension  Stable  Permissive hypertension (OK if < 180/105) but gradually normalize in 5-7 days  Long-term BP goal normotensive  Hyperlipidemia  Home meds:  brand name vytorin  LDL 171, goal < 70  Continue brand name vytorin at discharge  Other Stroke Risk Factors  Advanced age  Tachycardia induced cardiomyopathy w/ EF 60-65% in 2016.   Other Active Problems  CKD stage III, Cre 1.20 ->1.09->1.04  Hypothyroidism - on synthroid  Hospital day #  0  Neurology will sign off. Please call with questions. Pt will follow up with Cecille Rubin at [facility] in about 6 weeks. Thanks for the consult.   Rosalin Hawking, MD PhD Stroke Neurology 05/28/2016 4:55 PM   To contact Stroke Continuity provider, please refer to [email or web address] After hours, contact General Neurology

## 2016-05-28 NOTE — Progress Notes (Signed)
PROGRESS NOTE    Michele [name]  TTS:[id number] DOB: 1929/02/28 DOA: 05/26/2016 PCP: Cathlean Cower, MD   Brief Narrative: 83 y.o. female with history of chronic atrial fibrillation status post AV ablation and pacemaker placement, tachycardia induced cardiomyopathy which has improved, hypothyroidism and chronic kidney disease was brought to the ER the patient was [place].  Assessment & Plan:  #  Possible TIA versus conjugated migraine, less likely seizure: The patient does not have acute stroke. -CT head and CT angiogram with no acute finding. Unable to do MRI because of pacemaker. Patient is clinically improved. Evaluated by neurologist. -Pending echocardiogram -EEG with no epileptic foci -LDL 171, on a statin -A1c 5.5. -Patient is on aspirin, Coumadin. Monitor INR. Discussed with the neuro team.  #Paroxysmal atrial fibrillation: On Coumadin. Monitor INR. Patient does not like to switch to NOACs  # Hypertension: Stable. Monitor blood pressure.  #Hyperlipidemia: Patient takes vytorin at home. On a statin now. Plan to discharge him home medication.  #CK D stage III: Serum creatinine level is stable.  DVT prophylaxis: Coumadin Code Status: Full code Family Communication: Discussed with the patient's daughter Disposition Plan: Likely discharge home in 1-2 days    Consultants:   Neurologist  Procedures: [place], Echo pending. Antimicrobials: none Subjective: Patient was seen and examined at bedside. Patient was very pleasant. Denied headache, dizziness, nausea, vomiting, chest pain or shortness of breath.  Objective: Vitals:   05/27/16 1742 05/27/16 2118 05/28/16 0132 05/28/16 0522  BP: 109/64 (!) 149/77 125/72 137/72  Pulse: 73 74 75 (!) 107  Resp: 18 18 20 20   Temp: 98.1 F (36.7 C) 97.7 F (36.5 C) 97.7 F (36.5 C) 97.8 F (36.6 C)  TempSrc: Oral Oral Oral Oral  SpO2: 100% 100% 100% 98%  Weight:      Height:         Intake/Output Summary (Last 24 hours) at 05/28/16 1540 Last data filed at 05/28/16 0800  Gross per 24 hour  Intake              360 ml  Output                0 ml  Net              360 ml   Filed Weights   05/26/16 2018 05/26/16 2330  Weight: 58.5 kg (128 lb 15.5 oz) 58.5 kg (128 lb 15.5 oz)    Examination:  General exam: Appears calm and comfortable  Respiratory system: Clear to auscultation. Respiratory effort normal. No wheezing or crackle Cardiovascular system: S1 & S2 heard, RRR.  No pedal edema. Gastrointestinal system: Abdomen is nondistended, soft and nontender. Normal bowel sounds heard. Central nervous system: Alert and oriented. No focal neurological deficits. Extremities: Symmetric 5 x 5 power. Skin: No rashes, lesions or ulcers Psychiatry: Judgement and insight appear normal. Mood & affect appropriate.     Data Reviewed: I have personally reviewed following labs and imaging studies  CBC:  Recent Labs Lab 05/26/16 2026 05/26/16 2032 05/27/16 0617 05/28/16 0456  WBC 5.5  --  6.1 5.0  NEUTROABS 3.1  --   --   --   HGB 12.9 12.2 12.8 13.6  HCT 37.3 36.0 37.4 40.1  MCV 86.5  --  86.2 87.9  PLT 154  --  161 092   Basic Metabolic Panel:  Recent Labs Lab 05/26/16 2026 05/26/16 2032 05/27/16 0617 05/28/16 0456  NA 135 135 137  139  K 3.3* 3.2* 3.8 3.9  CL 102 102 105 107  CO2 22  --  23 23  GLUCOSE 94 94 87 83  BUN 22* 23* 15 18  CREATININE 1.28* 1.20* 1.09* 1.04*  CALCIUM 9.1  --  8.9 9.2   GFR: Estimated Creatinine Clearance: 33.3 mL/min (A) (by C-G formula based on SCr of 1.04 mg/dL (H)). Liver Function Tests:  Recent Labs Lab 05/26/16 2026 05/27/16 0617  AST 24 24  ALT 16 15  ALKPHOS 55 51  BILITOT 1.6* 2.3*  PROT 6.3* 6.2*  ALBUMIN 3.7 3.5   No results for input(s): LIPASE, AMYLASE in the last 168 hours. No results for input(s): AMMONIA in the last 168 hours. Coagulation Profile:  Recent Labs Lab 05/26/16 2026  05/27/16 0617 05/28/16 0456  INR 2.17 2.01 1.92   Cardiac Enzymes: No results for input(s): CKTOTAL, CKMB, CKMBINDEX, TROPONINI in the last 168 hours. BNP (last 3 results) No results for input(s): PROBNP in the last 8760 hours. HbA1C:  Recent Labs  05/27/16 0617  HGBA1C 5.5   CBG:  Recent Labs Lab 05/26/16 2016  GLUCAP 87   Lipid Profile:  Recent Labs  05/27/16 0617  CHOL 229*  HDL 44  LDLCALC 171*  TRIG 70  CHOLHDL 5.2   Thyroid Function Tests: No results for input(s): TSH, T4TOTAL, FREET4, T3FREE, THYROIDAB in the last 72 hours. Anemia Panel: No results for input(s): VITAMINB12, FOLATE, FERRITIN, TIBC, IRON, RETICCTPCT in the last 72 hours. Sepsis Labs: No results for input(s): PROCALCITON, LATICACIDVEN in the last 168 hours.  No results found for this or any previous visit (from the past 240 hour(s)).       Radiology Studies: Ct Angio Head W Or Wo Contrast  Result Date: 05/27/2016 CLINICAL DATA:  Headache and dizziness beginning at 2:30 yesterday while shopping. Confusion. History of atrial fibrillation, hyperlipidemia, AICD and stroke. EXAM: CT ANGIOGRAPHY HEAD AND NECK TECHNIQUE: Multidetector CT imaging of the head and neck was performed using the standard protocol during bolus administration of intravenous contrast. Multiplanar CT image reconstructions and MIPs were obtained to evaluate the vascular anatomy. Carotid stenosis measurements (when applicable) are obtained utilizing NASCET criteria, using the distal internal carotid diameter as the denominator. CONTRAST:  50 cc Isovue 370 COMPARISON:  CT HEAD May 26, 2016 FINDINGS: CT HEAD FINDINGS BRAIN: No intraparenchymal hemorrhage, mass effect nor midline shift. The ventricles and sulci are normal for age. Patchy to confluent supratentorial white matter hypodensities. No acute large vascular territory infarcts. No abnormal extra-axial fluid collections. Basal cisterns are patent. VASCULAR: Moderate calcific  atherosclerosis of the carotid siphons. SKULL: No skull fracture. No significant scalp soft tissue swelling. SINUSES/ORBITS: The mastoid air-cells and included paranasal sinuses are well-aerated.The included ocular globes and orbital contents are non-suspicious. OTHER: None. CTA NECK AORTIC ARCH: Normal appearance of the thoracic arch, 2 vessel arch is a normal variant. Mild calcific atherosclerosis of aortic arch. The origins of the innominate, left Common carotid artery and subclavian artery are widely patent. RIGHT CAROTID SYSTEM: Common carotid artery is widely patent. Normal appearance of the carotid bifurcation without hemodynamically significant stenosis by NASCET criteria, trace eccentric calcific atherosclerosis. Normal appearance of the included internal carotid artery. LEFT CAROTID SYSTEM: Common carotid artery is widely patent. Normal appearance of the carotid bifurcation without hemodynamically significant stenosis by NASCET criteria, trace eccentric calcific atherosclerosis. Normal appearance of the included internal carotid artery. VERTEBRAL ARTERIES:Left vertebral artery is dominant. Normal appearance of the vertebral arteries, which appear widely patent.  SKELETON: No acute osseous process though bone windows have not been submitted. Moderate degenerative change of the cervical spine. OTHER NECK: Soft tissues of the neck are non-acute though, not tailored for evaluation. LEFT AICD. Status post thyroidectomy. CTA HEAD ANTERIOR CIRCULATION: Patent cervical internal carotid arteries, petrous, cavernous and supra clinoid internal carotid arteries. Widely patent anterior communicating artery. Normal appearance of the anterior and middle cerebral arteries. No large vessel occlusion, hemodynamically significant stenosis, dissection, luminal irregularity, contrast extravasation or aneurysm. POSTERIOR CIRCULATION: Normal appearance of the vertebral arteries, vertebrobasilar junction and basilar artery, as  well as main branch vessels. Normal appearance of the posterior cerebral arteries. No large vessel occlusion, hemodynamically significant stenosis, dissection, luminal irregularity, contrast extravasation or aneurysm. VENOUS SINUSES: Major dural venous sinuses are patent though not tailored for evaluation on this angiographic examination. ANATOMIC VARIANTS: None. DELAYED PHASE: No abnormal intracranial enhancement. MIP images reviewed. IMPRESSION: CT HEAD: No acute intracranial process. Stable examination including moderate to severe chronic small vessel ischemic disease. CTA NECK: Minimal to mild atherosclerosis without hemodynamically significant stenosis or acute vascular process. CTA HEAD: No emergent large vessel occlusion or significant stenosis. Electronically Signed   By: Elon Alas M.D.   On: 05/27/2016 15:02   Ct Angio Neck W Or Wo Contrast  Result Date: 05/27/2016 CLINICAL DATA:  Headache and dizziness beginning at 2:30 yesterday while shopping. Confusion. History of atrial fibrillation, hyperlipidemia, AICD and stroke. EXAM: CT ANGIOGRAPHY HEAD AND NECK TECHNIQUE: Multidetector CT imaging of the head and neck was performed using the standard protocol during bolus administration of intravenous contrast. Multiplanar CT image reconstructions and MIPs were obtained to evaluate the vascular anatomy. Carotid stenosis measurements (when applicable) are obtained utilizing NASCET criteria, using the distal internal carotid diameter as the denominator. CONTRAST:  50 cc Isovue 370 COMPARISON:  CT HEAD May 26, 2016 FINDINGS: CT HEAD FINDINGS BRAIN: No intraparenchymal hemorrhage, mass effect nor midline shift. The ventricles and sulci are normal for age. Patchy to confluent supratentorial white matter hypodensities. No acute large vascular territory infarcts. No abnormal extra-axial fluid collections. Basal cisterns are patent. VASCULAR: Moderate calcific atherosclerosis of the carotid siphons. SKULL: No  skull fracture. No significant scalp soft tissue swelling. SINUSES/ORBITS: The mastoid air-cells and included paranasal sinuses are well-aerated.The included ocular globes and orbital contents are non-suspicious. OTHER: None. CTA NECK AORTIC ARCH: Normal appearance of the thoracic arch, 2 vessel arch is a normal variant. Mild calcific atherosclerosis of aortic arch. The origins of the innominate, left Common carotid artery and subclavian artery are widely patent. RIGHT CAROTID SYSTEM: Common carotid artery is widely patent. Normal appearance of the carotid bifurcation without hemodynamically significant stenosis by NASCET criteria, trace eccentric calcific atherosclerosis. Normal appearance of the included internal carotid artery. LEFT CAROTID SYSTEM: Common carotid artery is widely patent. Normal appearance of the carotid bifurcation without hemodynamically significant stenosis by NASCET criteria, trace eccentric calcific atherosclerosis. Normal appearance of the included internal carotid artery. VERTEBRAL ARTERIES:Left vertebral artery is dominant. Normal appearance of the vertebral arteries, which appear widely patent. SKELETON: No acute osseous process though bone windows have not been submitted. Moderate degenerative change of the cervical spine. OTHER NECK: Soft tissues of the neck are non-acute though, not tailored for evaluation. LEFT AICD. Status post thyroidectomy. CTA HEAD ANTERIOR CIRCULATION: Patent cervical internal carotid arteries, petrous, cavernous and supra clinoid internal carotid arteries. Widely patent anterior communicating artery. Normal appearance of the anterior and middle cerebral arteries. No large vessel occlusion, hemodynamically significant stenosis, dissection, luminal irregularity, contrast extravasation  or aneurysm. POSTERIOR CIRCULATION: Normal appearance of the vertebral arteries, vertebrobasilar junction and basilar artery, as well as main branch vessels. Normal appearance of the  posterior cerebral arteries. No large vessel occlusion, hemodynamically significant stenosis, dissection, luminal irregularity, contrast extravasation or aneurysm. VENOUS SINUSES: Major dural venous sinuses are patent though not tailored for evaluation on this angiographic examination. ANATOMIC VARIANTS: None. DELAYED PHASE: No abnormal intracranial enhancement. MIP images reviewed. IMPRESSION: CT HEAD: No acute intracranial process. Stable examination including moderate to severe chronic small vessel ischemic disease. CTA NECK: Minimal to mild atherosclerosis without hemodynamically significant stenosis or acute vascular process. CTA HEAD: No emergent large vessel occlusion or significant stenosis. Electronically Signed   By: Elon Alas M.D.   On: 05/27/2016 15:02   Ct Head Code Stroke W/o Cm  Result Date: 05/26/2016 CLINICAL DATA:  Code stroke. Fall, weakness, speech difficulty. History of atrial fibrillation, AICD. EXAM: CT HEAD WITHOUT CONTRAST TECHNIQUE: Contiguous axial images were obtained from the base of the skull through the vertex without intravenous contrast. COMPARISON:  CT HEAD October 18, 2013 FINDINGS: BRAIN: No intraparenchymal hemorrhage, mass effect nor midline shift. The ventricles and sulci are normal for age. Patchy to confluent supratentorial white matter hypodensities increased from stick prior study. No acute large vascular territory infarcts. No abnormal extra-axial fluid collections. Basal cisterns are patent. VASCULAR: Moderate calcific atherosclerosis of the carotid siphons. SKULL: No skull fracture. No significant scalp soft tissue swelling. SINUSES/ORBITS: The mastoid air-cells and included paranasal sinuses are well-aerated.The included ocular globes and orbital contents are non-suspicious. OTHER: None. ASPECTS [facility] Stroke Program Early CT Score) - Ganglionic level infarction (caudate, lentiform nuclei, internal capsule, insula, M1-M3 cortex): 7 - Supraganglionic  infarction (M4-M6 cortex): 3 Total score (0-10 with 10 being normal): 10 IMPRESSION: 1. No acute intracranial process. 2. Progressed moderate to severe chronic small vessel ischemic disease. 3. ASPECTS is 10. Critical Value/emergent results were called by telephone at the time of interpretation on 05/26/2016 at 8:37 pm to Dr. Carmin Muskrat , who verbally acknowledged these results. Electronically Signed   By: Elon Alas M.D.   On: 05/26/2016 20:38        Scheduled Meds: . aspirin  325 mg Oral Daily  . atorvastatin  40 mg Oral Daily  . cholecalciferol  1,000 Units Oral Daily  . levothyroxine  88 mcg Oral QAC breakfast  . warfarin  5 mg Oral ONCE-1800  . Warfarin - Pharmacist Dosing Inpatient   Does not apply q1800   Continuous Infusions:   LOS: 0 days    Dron Tanna Furry, MD [facility] Pager [phone]  If 7PM-7AM, please contact night-coverage [email or web address] Password [facility] 05/28/2016, 3:40 PM

## 2016-05-28 NOTE — Progress Notes (Signed)
Occupational Therapy Treatment Patient Details Name: Michele [name] MRN: [id number] DOB: 10-09-1929 Today's Date: 05/28/2016    History of present illness Pt presented to the ED after being hound confused, dizzy, and weak [place] shopping [place]. In ED was found to have dense hemianopsia in L eye. CT unremarkable and unable to complete MRI as pt with pacemaker and defibrillator. PMH significant for chronic atrial fibrillation s/p AV ablation and pacemaker placement, tachycardia induced cardiomyopathy, hypothyroidism, and chronic kidney disease.   OT comments  Pt demonstrates good progress and has met all OT goals. Able to complete visual scanning tasks with organized scanning pattern this session and demonstrated no functional visual field deficits. Improved problem solving abilities this session. Educated pt on safety at home post-acute D/C and she verbalized understanding. Pt able to complete ADL at modified independent level at this time. Updated D/C plan to no OT follow-up due to pt progress this session. No further acute OT needs identified. Will sign off.    Follow Up Recommendations  No OT follow up;Supervision - Intermittent    Equipment Recommendations  3 in 1 bedside commode    Recommendations for Other Services      Precautions / Restrictions Precautions Precautions: Fall Precaution Comments: Slightly unsteady at times. Restrictions Weight Bearing Restrictions: No       Mobility Bed Mobility Overal bed mobility: Modified Independent                Transfers Overall transfer level: Modified independent Equipment used: None Transfers: Sit to/from Stand Sit to Stand: Modified independent (Device/Increase time)              Balance Overall balance assessment: Needs assistance Sitting-balance support: No upper extremity supported;Feet supported Sitting balance-Leahy Scale: Good     Standing balance support: No upper extremity supported;During functional  activity;Single extremity supported Standing balance-Leahy Scale: Fair Standing balance comment: Does stay close to furniture and reach for it at times.                           ADL either performed or assessed with clinical judgement   ADL Overall ADL's : Needs assistance/impaired                         Toilet Transfer: Ambulation;Modified Independent           Functional mobility during ADLs: Modified independent General ADL Comments: Pt reporting improved dizziness this session and able to ambulate in room with modified independence.     Vision   Vision Assessment?: Yes Eye Alignment: Within Functional Limits Ocular Range of Motion: Within Functional Limits Alignment/Gaze Preference: Within Defined Limits Tracking/Visual Pursuits: Able to track stimulus in all quads without difficulty Saccades: Within functional limits Convergence: Within functional limits Visual Fields: No apparent deficits Additional Comments: R eye redness noted as pt reports she had been rubbing her eye. MD notified. Pt able to complete scattered scanning sheet demonstrating organized scanning pattern. On confrontation testing, no deficits noted in visual fields.    Perception     Praxis      Cognition Arousal/Alertness: Awake/alert Behavior During Therapy: WFL for tasks assessed/performed Overall Cognitive Status: Impaired/Different from baseline Area of Impairment: Problem solving                             Problem Solving: Requires verbal cues General Comments: Difficulty following commands to  complete scattered scanning sheet initially but after a few attempts of redirection able to complete task well. Able to complete visual construction task to draw clock with min verbal cues. Demonstrated improved problem solving this session.        Exercises     Shoulder Instructions       General Comments      Pertinent Vitals/ Pain       Pain Assessment:  No/denies pain  Home Living Family/patient expects to be discharged to:: Private residence Living Arrangements: Alone   Type of Home: House Home Access: Stairs to enter [employer] of Steps: 4 at one entrance, 2 at another Entrance Stairs-Rails: Left;Right Home Layout: Two level;Bed/bath upstairs Alternate Level Stairs-Number of Steps: flight Alternate Level Stairs-Rails: Left Bathroom Shower/Tub: Tub/shower unit         Home Equipment: None          Prior Functioning/Environment          Comments: Drives and was completely independent.   Frequency  Min 2X/week        Progress Toward Goals  OT Goals(current goals can now be found in the care plan section)  Progress towards OT goals: Goals met/education completed, patient discharged from OT  Acute Rehab OT Goals Patient Stated Goal: to go home OT Goal Formulation: With patient/family Time For Goal Achievement: 06/10/16 Potential to Achieve Goals: Good ADL Goals Pt Will Perform Grooming: with modified independence;standing Pt Will Transfer to Toilet: with modified independence;ambulating;regular height toilet Pt Will Perform Toileting - Clothing Manipulation and hygiene: with modified independence;sit to/from stand Pt Will Perform Tub/Shower Transfer: with modified independence;Tub transfer;ambulating;3 in 1;shower seat Additional ADL Goal #1: Pt will identify 5/5 items in the L visual field with no more than 1 verbal cue to improve functional use of vision during ADL tasks.  Plan Discharge plan needs to be updated    Co-evaluation                 End of Session    OT Visit Diagnosis: Unsteadiness on feet (R26.81)   Activity Tolerance Patient tolerated treatment well   Patient Left in chair;with call bell/phone within reach;with family/visitor present   Nurse Communication Mobility status    Functional Assessment Tool Used: AM-PAC 6 Clicks Daily Activity Functional Limitation: Self  care Self Care Current Status (H6808): 0 percent impaired, limited or restricted Self Care Goal Status (U1103): 0 percent impaired, limited or restricted Self Care Discharge Status [phone]): 0 percent impaired, limited or restricted   Time: 1003-1035 OT Time Calculation (min): 32 min  Charges: OT G-codes **NOT FOR INPATIENT CLASS** Functional Assessment Tool Used: AM-PAC 6 Clicks Daily Activity Functional Limitation: Self care Self Care Current Status (O5929): 0 percent impaired, limited or restricted Self Care Goal Status (W4462): 0 percent impaired, limited or restricted Self Care Discharge Status (M6381): 0 percent impaired, limited or restricted OT General Charges $OT Visit: 1 Procedure OT Treatments $Self Care/Home Management : 8-22 mins $Therapeutic Activity: 23-37 mins  Norman Herrlich, MS OTR/L  Pager: [place] A Keita Demarco 05/28/2016, 12:02 PM

## 2016-05-28 NOTE — Progress Notes (Signed)
[place] for warfarin Indication: atrial fibrillation  Allergies  Allergen Reactions  . Cephalexin Hives  . Erythromycin Swelling and Other (See Comments)    REACTION: swollen and sore mouth  . Terbinafine Hcl     Rash   . Colchicine Other (See Comments)    REACTION: violent diarrhea  . Rosuvastatin Other (See Comments)    REACTION: upper arm pain bilaterally  . Vytorin [Ezetimibe-Simvastatin] Other (See Comments)    Nightmares with generic   Labs:  Recent Labs  05/26/16 2026 05/26/16 2032 05/27/16 0617 05/28/16 0456  HGB 12.9 12.2 12.8 13.6  HCT 37.3 36.0 37.4 40.1  PLT 154  --  161 161  APTT 46*  --   --   --   LABPROT 24.6*  --  23.1* 22.2*  INR 2.17  --  2.01 1.92  CREATININE 1.28* 1.20* 1.09* 1.04*    Estimated Creatinine Clearance: 33.3 mL/min (A) (by C-G formula based on SCr of 1.04 mg/dL (H)).    Assessment: 83 yo female on warfarin prior to admission for atrial fibrillation admitted 4/9 with acute CVA. Warfarin initially held but to resume this evening per neurology.  INR = 1.92 No bleeding noted   Goal of Therapy:  INR 2-3 Monitor platelets by anticoagulation protocol: Yes   Plan:  1. Repeat Warfarin 5 mg x 1  2. Daily INR 3. DC aspirin when INR therapeutic  Thank you Anette Guarneri, PharmD [phone]  05/28/2016, 8:54 AM

## 2016-05-28 NOTE — Evaluation (Signed)
Physical Therapy Evaluation Patient Details Name: Michele [name] MRN: [id number] DOB: Aug 24, 1929 Today's Date: 05/28/2016   History of Present Illness  Pt presented to the ED after being hound confused, [place]. In ED was found to have dense hemianopsia in L eye. CT unremarkable and unable to complete MRI as pt with pacemaker and defibrillator. PMH significant for chronic atrial fibrillation s/p AV ablation and pacemaker placement, tachycardia induced cardiomyopathy, hypothyroidism, and chronic kidney disease.  Clinical Impression  Patient presents with mobility close to baseline, but some instability with ambulation initially, but improved with continued ambulation and scored 21/24 on DGI with low risk for falls.  Did review fall risk reduction information with pt and daughter focusing on footwear, lighting and not reaching up overhead.  Feel continued skilled PT in the acute setting indicated to maximize independence/safety prior to d/c.      No PT follow up    Equipment Recommendations  None recommended by PT    Recommendations for Other Services       Precautions / Restrictions Precautions Precautions: Fall Precaution Comments: Slightly unsteady at times. Restrictions Weight Bearing Restrictions: No      Mobility  Bed Mobility Overal bed mobility: Modified Independent             General bed mobility comments: up in chair  Transfers Overall transfer level: Modified independent Equipment used: None Transfers: Sit to/from Stand Sit to Stand: Modified independent (Device/Increase time)            Ambulation/Gait   Ambulation Distance (Feet): 200 Feet Assistive device: None Gait Pattern/deviations: Step-through pattern;Scissoring;Decreased stride length;Narrow base of support     General Gait Details: at times noted R foot crossing in front of L initially with mild imbalance and veering in hallway, then as gait progressed improved  with DGI WFL  Stairs Stairs: Yes Stairs assistance: Supervision Stair Management: One rail Right;Alternating pattern;Forwards;Two rails Number of Stairs: 15 General stair comments: up first flight one rail, then demo on further steps how she showed her spouse to go up stairs when he was living with CHF (crawled,) then back down flight with two rails initially, then one rail, S for safety  Wheelchair Mobility    Modified Rankin (Stroke Patients Only) Modified Rankin (Stroke Patients Only) Pre-Morbid Rankin Score: No symptoms Modified Rankin: Slight disability     Balance Overall balance assessment: Needs assistance Sitting-balance support: No upper extremity supported;Feet supported Sitting balance-Leahy Scale: Good     Standing balance support: No upper extremity supported;During functional activity;Single extremity supported Standing balance-Leahy Scale: Fair Standing balance comment: Does stay close to furniture and reach for it at times.                 Standardized Balance Assessment Standardized Balance Assessment : Dynamic Gait Index   Dynamic Gait Index Level Surface: Mild Impairment Change in Gait Speed: Normal Gait with Horizontal Head Turns: Normal Gait with Vertical Head Turns: Normal Gait and Pivot Turn: Mild Impairment Step Over Obstacle: Normal Step Around Obstacles: Normal Steps: Mild Impairment Total Score: 21       Pertinent Vitals/Pain Pain Assessment: No/denies pain    Home Living Family/patient expects to be discharged to:: Private residence Living Arrangements: Alone   Type of Home: House Home Access: Stairs to enter Entrance Stairs-Rails: Chemical engineer of Steps: 4 at one entrance, 2 at another Home Layout: Two level;Bed/bath upstairs Home Equipment: None      Prior Function  Comments: Drives and was completely independent.     Hand Dominance   Dominant Hand: Right    Extremity/Trunk  Assessment   Upper Extremity Assessment Upper Extremity Assessment: Defer to OT evaluation    Lower Extremity Assessment Lower Extremity Assessment: Overall WFL for tasks assessed       Communication   Communication: No difficulties  Cognition Arousal/Alertness: Awake/alert Behavior During Therapy: WFL for tasks assessed/performed Overall Cognitive Status: Within Functional Limits for tasks assessed Area of Impairment: Problem solving                             Problem Solving: Requires verbal cues General Comments: Difficulty following commands to complete scattered scanning sheet initially but after a few attempts of redirection able to complete task well. Able to complete visual construction task to draw clock with min verbal cues. Demonstrated improved problem solving this session.      General Comments General comments (skin integrity, edema, etc.): educated pt and daughter about fall prevention in the home and plan for further balance work with HEP from previous outpatient rehab activities.  Discussed not to return to the gym until cleared by cardiologist.    Exercises     Assessment/Plan    PT Assessment Patient needs continued PT services  PT Problem List Decreased balance;Decreased coordination       PT Treatment Interventions Therapeutic activities;Gait training;Therapeutic exercise;Stair training;Functional mobility training;Balance training    PT Goals (Current goals can be found in the Care Plan section)  Acute Rehab PT Goals Patient Stated Goal: to go home PT Goal Formulation: With patient/family Time For Goal Achievement: 05/31/16 Potential to Achieve Goals: Good    Frequency Min 3X/week   Barriers to discharge        Co-evaluation               End of Session Equipment Utilized During Treatment: Gait belt Activity Tolerance: Patient tolerated treatment well Patient left: in chair;with call bell/phone within reach;with  family/visitor present   PT Visit Diagnosis: Other abnormalities of gait and mobility (R26.89)    Time: 2575-0518 PT Time Calculation (min) (ACUTE ONLY): 33 min   Charges:   PT Evaluation $PT Eval Moderate Complexity: 1 Procedure PT Treatments $Gait Training: 8-22 mins   PT G Codes:   PT G-Codes **NOT FOR INPATIENT CLASS** Functional Assessment Tool Used: AM-PAC 6 Clicks Basic Mobility Functional Limitation: Mobility: Walking and moving around Mobility: Walking and Moving Around Current Status (Z3582): At least 40 percent but less than 60 percent impaired, limited or restricted Mobility: Walking and Moving Around Goal Status [phone]): At least 20 percent but less than 40 percent impaired, limited or restricted    [place], [place] 05/28/2016   Reginia Naas 05/28/2016, 2:14 PM

## 2016-05-28 NOTE — Discharge Summary (Addendum)
Physician Discharge Summary  Michele [name] JFH:[id number] DOB: April 20, 1929 DOA: 05/26/2016  PCP: Cathlean Cower, MD  Admit date: 05/26/2016 Discharge date: 05/28/2016  Admitted From:home Disposition:home  Recommendations for Outpatient Follow-up:  1. Follow up with PCP in 1-2 weeks 2. Please obtain BMP/CBC in one week  Home Health:heart healthy Equipment/Devices:none Discharge Condition:stable CODE STATUS:full Diet recommendation:heart healthy  Brief/Interim Summary: 83 y.o.femalewith history of chronic atrial fibrillation status post AV ablation and pacemaker placement, tachycardia induced cardiomyopathy which has improved, hypothyroidism and chronic kidney disease was brought to the ER the patient was found to [place] the shopping [place].  #  Possible TIA versus conjugated migraine, less likely seizure: The patient does not have acute stroke. -CT head and CT angiogram with no acute finding. Unable to do MRI because of pacemaker. Patient is clinically improved. Evaluated by neurologist. -EEG with no epileptic foci -LDL 171, on  statin -A1c 5.5. -resumed coumadin, monitor INR. No need for asa as per dr. Erlinda Hong. Discussed with the neuro team.  #Paroxysmal atrial fibrillation: On Coumadin. Monitor INR. Patient does not like to switch to NOACs  # Hypertension: Stable. Monitor blood pressure.  #Hyperlipidemia: on statin.  #CK D stage III: Serum creatinine level is stable.  # Hypothyroidism: TSH is low. Apparently, the dose or synthroid recently reduced to 88 mcg which pt hasn't started yet. Recommended to continue lower dose of synthroid and repeat thyroid function test in 4-6 weeks. Follow up with pcp.   Patient is clinically improved. Discussed with the neurologist. Continue Coumadin, statin. Patient is able to ambulate without difficulties. Mental status improved. Discussed with the cardiology regarding echocardiogram. The echocardiogram results will be reviewed for  discharge.  Discharge Diagnoses:  Principal Problem: TIA    ATRIAL FIBRILLATION   Automatic implantable cardioverter-defibrillator in situ     Vertebrobasilar artery syndrome   Complicated migraine    Discharge Instructions  Discharge Instructions    Ambulatory referral to Neurology    Complete by:  As directed    An appointment is requested in approximately: 4 weeks   Call MD for:  difficulty breathing, headache or visual disturbances    Complete by:  As directed    Call MD for:  extreme fatigue    Complete by:  As directed    Call MD for:  hives    Complete by:  As directed    Call MD for:  persistant dizziness or light-headedness    Complete by:  As directed    Call MD for:  persistant nausea and vomiting    Complete by:  As directed    Call MD for:  severe uncontrolled pain    Complete by:  As directed    Call MD for:  temperature >100.4    Complete by:  As directed    Diet - low sodium heart healthy    Complete by:  As directed    Discharge instructions    Complete by:  As directed    Please continue coumadin and monitor INR.  Neurology clinic will call for follow up appointment, Please check thyroid function test in 4 weeks with your pcp.   Increase activity slowly    Complete by:  As directed      Allergies as of 05/28/2016      Reactions   Cephalexin Hives   Erythromycin Swelling, Other (See Comments)   REACTION: swollen and sore mouth   Terbinafine Hcl    Rash   Colchicine Other (See Comments)  REACTION: violent diarrhea   Rosuvastatin Other (See Comments)   REACTION: upper arm pain bilaterally   Vytorin [ezetimibe-simvastatin] Other (See Comments)   Nightmares with generic      Medication List    TAKE these medications   atorvastatin 20 MG tablet Commonly known as:  LIPITOR Take 2 tablets (40 mg total) by mouth daily. What changed:  how much to take   cholecalciferol 1000 units tablet Commonly known as:  VITAMIN D Take 1,000 Units by mouth  daily.   co-enzyme Q-10 30 MG capsule Take 30 mg by mouth daily.   furosemide 20 MG tablet Commonly known as:  LASIX Take 1 tablet (20 mg total) by mouth as needed. What changed:  reasons to take this   levothyroxine 88 MCG tablet Commonly known as:  SYNTHROID, LEVOTHROID Take 1 tablet (88 mcg total) by mouth daily.   warfarin 5 MG tablet Commonly known as:  COUMADIN TAKE AS DIRECTED. What changed:  how much to take  how to take this  when to take this  additional instructions      Follow-up Information    Cathlean Cower, MD. Schedule an appointment as soon as possible for a visit in 1 week(s).   Specialties:  Internal Medicine, Radiology Contact information: [place] [address] [phone]        Dennie Bible, NP. Schedule an appointment as soon as possible for a visit in 6 week(s).   Specialty:  Family Medicine Why:  stroke follow up  Contact information: [address] [place] [address] [phone]          Allergies  Allergen Reactions  . Cephalexin Hives  . Erythromycin Swelling and Other (See Comments)    REACTION: swollen and sore mouth  . Terbinafine Hcl     Rash   . Colchicine Other (See Comments)    REACTION: violent diarrhea  . Rosuvastatin Other (See Comments)    REACTION: upper arm pain bilaterally  . Vytorin [Ezetimibe-Simvastatin] Other (See Comments)    Nightmares with generic    Consultations: neurology  Procedures/Studies: CT head, CUS  Subjective: Patient was seen and examined at bedside. Mentally status improved. Denied headache, dizziness, nausea, vomiting, chest pain or shortness of breath.  Discharge Exam: Vitals:   05/28/16 0132 05/28/16 0522  BP: 125/72 137/72  Pulse: 75 (!) 107  Resp: 20 20  Temp: 97.7 F (36.5 C) 97.8 F (36.6 C)   Vitals:   05/27/16 1742 05/27/16 2118 05/28/16 0132 05/28/16 0522  BP: 109/64 (!) 149/77 125/72 137/72  Pulse: 73 74 75 (!)  107  Resp: 18 18 20 20   Temp: 98.1 F (36.7 C) 97.7 F (36.5 C) 97.7 F (36.5 C) 97.8 F (36.6 C)  TempSrc: Oral Oral Oral Oral  SpO2: 100% 100% 100% 98%  Weight:      Height:        General: Pt is alert, awake, not in acute distress Cardiovascular: RRR, S1/S2 +, no rubs, no gallops Respiratory: CTA bilaterally, no wheezing, no rhonchi Abdominal: Soft, NT, ND, bowel sounds + Extremities: no edema, no cyanosis    The results of significant diagnostics from this hospitalization (including imaging, microbiology, ancillary and laboratory) are listed below for reference.     Microbiology: No results found for this or any previous visit (from the past 240 hour(s)).   Labs: BNP (last 3 results) No results for input(s): BNP in the last 8760 hours. Basic Metabolic Panel:  Recent  Labs Lab 05/26/16 2026 05/26/16 2032 05/27/16 0617 05/28/16 0456  NA 135 135 137 139  K 3.3* 3.2* 3.8 3.9  CL 102 102 105 107  CO2 22  --  23 23  GLUCOSE 94 94 87 83  BUN 22* 23* 15 18  CREATININE 1.28* 1.20* 1.09* 1.04*  CALCIUM 9.1  --  8.9 9.2   Liver Function Tests:  Recent Labs Lab 05/26/16 2026 05/27/16 0617  AST 24 24  ALT 16 15  ALKPHOS 55 51  BILITOT 1.6* 2.3*  PROT 6.3* 6.2*  ALBUMIN 3.7 3.5   No results for input(s): LIPASE, AMYLASE in the last 168 hours. No results for input(s): AMMONIA in the last 168 hours. CBC:  Recent Labs Lab 05/26/16 2026 05/26/16 2032 05/27/16 0617 05/28/16 0456  WBC 5.5  --  6.1 5.0  NEUTROABS 3.1  --   --   --   HGB 12.9 12.2 12.8 13.6  HCT 37.3 36.0 37.4 40.1  MCV 86.5  --  86.2 87.9  PLT 154  --  161 161   Cardiac Enzymes: No results for input(s): CKTOTAL, CKMB, CKMBINDEX, TROPONINI in the last 168 hours. BNP: Invalid input(s): POCBNP CBG:  Recent Labs Lab 05/26/16 2016  GLUCAP 87   D-Dimer No results for input(s): DDIMER in the last 72 hours. Hgb A1c  Recent Labs  05/27/16 0617  HGBA1C 5.5   Lipid Profile  Recent  Labs  05/27/16 0617  CHOL 229*  HDL 44  LDLCALC 171*  TRIG 70  CHOLHDL 5.2   Thyroid function studies No results for input(s): TSH, T4TOTAL, T3FREE, THYROIDAB in the last 72 hours.  Invalid input(s): FREET3 Anemia work up No results for input(s): VITAMINB12, FOLATE, FERRITIN, TIBC, IRON, RETICCTPCT in the last 72 hours. Urinalysis    Component Value Date/Time   COLORURINE YELLOW 12/07/2015 [place] 12/07/2015 1231   LABSPEC 1.010 12/07/2015 1231   PHURINE 6.0 12/07/2015 1231   GLUCOSEU NEGATIVE 12/07/2015 1231   HGBUR TRACE-LYSED (A) 12/07/2015 1231   BILIRUBINUR NEGATIVE 12/07/2015 1231   BILIRUBINUR negative 09/16/2013 1358   KETONESUR NEGATIVE 12/07/2015 1231   PROTEINUR 30+ 09/16/2013 1358   PROTEINUR NEGATIVE 06/01/2013 0633   UROBILINOGEN 0.2 12/07/2015 1231   NITRITE NEGATIVE 12/07/2015 1231   LEUKOCYTESUR NEGATIVE 12/07/2015 1231   Sepsis Labs Invalid input(s): PROCALCITONIN,  WBC,  LACTICIDVEN Microbiology No results found for this or any previous visit (from the past 240 hour(s)).   Time coordinating discharge: 28 minutes  SIGNED:   Rosita Fire, MD  [facility] 05/28/2016, 5:37 PM  If 7PM-7AM, please contact night-coverage [email or web address] Password [email or web address]

## 2016-05-28 NOTE — Discharge Instructions (Signed)

## 2016-05-28 NOTE — Procedures (Signed)
ELECTROENCEPHALOGRAM REPORT  Date of Study: 05/28/2016  Patient's Name: Michele [name] MRN: [id number] Date of Birth: 1929-03-10  Referring Provider: Dr. Rosalin Hawking  Clinical History: This is an 83 year old woman with left hemianopia and dizziness  Medications: acetaminophen (TYLENOL) tablet 650 mg  aspirin tablet 325 mg  atorvastatin (LIPITOR) tablet 40 mg  cholecalciferol (VITAMIN D) tablet 1,000 Units  levothyroxine (SYNTHROID, LEVOTHROID) tablet 88 mcg  warfarin (COUMADIN) tablet 5 mg   Technical Summary: A multichannel digital EEG recording measured by the international 10-20 system with electrodes applied with paste and impedances below 5000 ohms performed in our laboratory with EKG monitoring in an awake and asleep patient.  Hyperventilation and photic stimulation were not performed.  The digital EEG was referentially recorded, reformatted, and digitally filtered in a variety of bipolar and referential montages for optimal display.    Description: The patient is awake and asleep during the recording.  During maximal wakefulness, there is a symmetric, medium voltage 8.5 Hz posterior dominant rhythm that attenuates with eye opening.  The record is symmetric.  During drowsiness and sleep, there is an increase in theta slowing of the background.  Vertex waves and symmetric sleep spindles were seen.  Hyperventilation and photic stimulation were not performed.  There were no epileptiform discharges or electrographic seizures seen.    EKG lead was unremarkable.  Impression: This awake and asleep EEG is normal.    Clinical Correlation: A normal EEG does not exclude a clinical diagnosis of epilepsy. Clinical correlation is advised.   Ellouise Newer, M.D.

## 2016-05-29 ENCOUNTER — Telehealth: Payer: Self-pay | Admitting: *Deleted

## 2016-05-29 NOTE — Telephone Encounter (Signed)
Transition Care Management Follow-up Telephone Call   Date discharged? 05/28/16   How have you been since you were released from the hospital? Pt states she is doing fine   Do you understand why you were in the hospital? YES   Do you understand the discharge instructions? YES   Where were you discharged to? Home   Items Reviewed:  Medications reviewed: YES  Allergies reviewed: YES  Dietary changes reviewed: YES  Referrals reviewed: No referral needd   Functional Questionnaire:   Activities of Daily Living (ADLs):   She states she are independent in the following: ambulation, bathing and hygiene, feeding, continence, grooming, toileting and dressing States she doesn't require assistance    Any transportation issues/concerns?: NO   Any patient concerns? NO   Confirmed importance and date/time of follow-up visits scheduled: YES, appt 06/06/16  Provider Appointment booked with Dr. Jenny Reichmann  Confirmed with patient if condition begins to worsen call PCP or go to the ER.  Patient was given the office number and encouraged to call back with question or concerns.  : YES

## 2016-06-06 ENCOUNTER — Ambulatory Visit (INDEPENDENT_AMBULATORY_CARE_PROVIDER_SITE_OTHER): Payer: Medicare Other | Admitting: Internal Medicine

## 2016-06-06 ENCOUNTER — Ambulatory Visit (INDEPENDENT_AMBULATORY_CARE_PROVIDER_SITE_OTHER): Payer: Medicare Other | Admitting: General Practice

## 2016-06-06 ENCOUNTER — Encounter: Payer: Self-pay | Admitting: Internal Medicine

## 2016-06-06 ENCOUNTER — Other Ambulatory Visit (INDEPENDENT_AMBULATORY_CARE_PROVIDER_SITE_OTHER): Payer: Medicare Other

## 2016-06-06 VITALS — BP 112/70 | HR 82 | Ht 64.0 in | Wt 126.0 lb

## 2016-06-06 DIAGNOSIS — Z0000 Encounter for general adult medical examination without abnormal findings: Secondary | ICD-10-CM | POA: Diagnosis not present

## 2016-06-06 DIAGNOSIS — Z5181 Encounter for therapeutic drug level monitoring: Secondary | ICD-10-CM

## 2016-06-06 DIAGNOSIS — N289 Disorder of kidney and ureter, unspecified: Secondary | ICD-10-CM

## 2016-06-06 DIAGNOSIS — I639 Cerebral infarction, unspecified: Secondary | ICD-10-CM

## 2016-06-06 DIAGNOSIS — Z7901 Long term (current) use of anticoagulants: Secondary | ICD-10-CM | POA: Diagnosis not present

## 2016-06-06 DIAGNOSIS — E785 Hyperlipidemia, unspecified: Secondary | ICD-10-CM

## 2016-06-06 LAB — CBC WITH DIFFERENTIAL/PLATELET
BASOS ABS: 0 10*3/uL (ref 0.0–0.1)
Basophils Relative: 0.4 % (ref 0.0–3.0)
EOS ABS: 0.1 10*3/uL (ref 0.0–0.7)
Eosinophils Relative: 1.2 % (ref 0.0–5.0)
HCT: 41.2 % (ref 36.0–46.0)
HEMOGLOBIN: 13.8 g/dL (ref 12.0–15.0)
LYMPHS PCT: 31.2 % (ref 12.0–46.0)
Lymphs Abs: 2.3 10*3/uL (ref 0.7–4.0)
MCHC: 33.4 g/dL (ref 30.0–36.0)
MCV: 90 fl (ref 78.0–100.0)
MONOS PCT: 9.5 % (ref 3.0–12.0)
Monocytes Absolute: 0.7 10*3/uL (ref 0.1–1.0)
NEUTROS ABS: 4.3 10*3/uL (ref 1.4–7.7)
Neutrophils Relative %: 57.7 % (ref 43.0–77.0)
Platelets: 200 10*3/uL (ref 150.0–400.0)
RBC: 4.58 Mil/uL (ref 3.87–5.11)
RDW: 14.3 % (ref 11.5–15.5)
WBC: 7.4 10*3/uL (ref 4.0–10.5)

## 2016-06-06 LAB — BASIC METABOLIC PANEL
BUN: 26 mg/dL — ABNORMAL HIGH (ref 6–23)
CALCIUM: 9.6 mg/dL (ref 8.4–10.5)
CHLORIDE: 102 meq/L (ref 96–112)
CO2: 28 meq/L (ref 19–32)
Creatinine, Ser: 1.28 mg/dL — ABNORMAL HIGH (ref 0.40–1.20)
GFR: 42.15 mL/min — ABNORMAL LOW (ref >60.00)
Glucose, Bld: 101 mg/dL — ABNORMAL HIGH (ref 70–99)
Potassium: 4.3 mEq/L (ref 3.5–5.1)
SODIUM: 140 meq/L (ref 135–145)

## 2016-06-06 LAB — POCT INR: INR: 1.9

## 2016-06-06 MED ORDER — VYTORIN 10-40 MG PO TABS: 1.0000 | ORAL_TABLET | Freq: Every day | ORAL | 3 refills | Status: DC

## 2016-06-06 NOTE — Patient Instructions (Addendum)
Ok to change back to the Brand name Vytorin and you are given the hardcopy today  Please continue all other medications as before, and refills have been done if requested.  Please have the pharmacy call with any other refills you may need.  Please keep your appointments with your specialists as you may have planned  Please go to the LAB in the Basement (turn left off the elevator) for the tests to be done today  You will be contacted by phone if any changes need to be made immediately.  Otherwise, you will receive a letter about your results with an explanation, but please check with MyChart first.  Please remember to sign up for MyChart if you have not done so, as this will be important to you in the future with finding out test results, communicating by private email, and scheduling acute appointments online when needed.  Please return in 3 months, or sooner if needed, with Lab testing done 3-5 days before

## 2016-06-06 NOTE — Progress Notes (Signed)
I have reviewed and agree with the plan. 

## 2016-06-06 NOTE — Progress Notes (Signed)
Subjective:    Patient ID: Michele [name], female    DOB: [birth year], 83 y.o.   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 Admitted with ?acute CVA, unable for MRI due to AICD; initiall CT head neg for acute, CTA neck and head with further significant vascular dz amenable to tx. She states today she remembers initial confusion, warm feeling, and dizziness.  Pt cannot otherwise recall initial events prompting , but does remember being discharged 4/11 with ? Of CVA vs complicated migraine vs sz.  LDl found to be unusually high at 171, and now states she had stopped generic vytorin  prior to hospn at least one week due to nightmares.  Now taking the lipitor according the the d/c summary, but she is unaware of taking new lipitor.  [facility] [place] [facility] [address] [facility] [place] far.  Wants to go back to Brand name Vytorin to take with her to look for a closer pharmacy. Pt denies chest pain, increased sob or doe, wheezing, orthopnea, PND, increased LE swelling, palpitations, dizziness or syncope. Pt denies new neurological symptoms such as new headache, or facial or extremity weakness or numbness   Pt denies fever, wt loss, night sweats, loss of appetite, or other constitutional symptoms Past Medical History:  Diagnosis Date  . Atrial fibrillation ([place])    NEG STRESS CARDIOLITE  . C. difficile colitis   . Cardiomyopathy    RATE RELATED  . CHF (congestive heart failure) ([place])   . Diverticulitis    PMH of X 2  . Diverticulosis    DIVERTICULITIS  . Gilbert's syndrome   . Hx of colonic polyp 2011   Dr Fuller Plan  . Hyperlipidemia    NMR LIOPROFILE LDL 234(3206/2234)HDL 37,TG86  . Renal insufficiency 12/09/2015  . Thyroid  disease    HYPOTHYROIDISM...THYROID NODULES   Past Surgical History:  Procedure Laterality Date  . ABDOMINAL HYSTERECTOMY  1973  . APPENDECTOMY  1956  . BIOPSY THYROID     SINGLE NODULE  . BREAST BIOPSY  1997  . CARDIAC DEFIBRILLATOR PLACEMENT    . CHOLECYSTECTOMY  1972  . colonoscopy with polypectomy  2011  . EP IMPLANTABLE DEVICE N/A 11/21/2015   Procedure: BIV ICD Generator Changeout;  Surgeon: Deboraha Sprang, MD;  Location: [facility];  Service: Cardiovascular;  Laterality: N/A;  . ICD....02/2008    . LEAD REVISION N/A 08/21/2011   Procedure: LEAD REVISION;  Surgeon: Deboraha Sprang, MD;  Location: [facility];  Service: Cardiovascular;  Laterality: N/A;  . PACEMAKER PLACEMENT     10/2006  . ROTATOR CUFF REPAIR     x2  . THROIDECTOMY 12/08     benign    reports that she has never smoked. She has never used smokeless tobacco. She reports that she does not drink alcohol or use drugs. family history includes Heart attack (age of onset: 3) in her brother; Heart attack (age of onset: 36) in her mother; Lung disease in her father; Ulcerative colitis in her son; Vasculitis in her sister. Allergies  Allergen Reactions  . Cephalexin Hives  . Erythromycin Swelling and Other (See Comments)    REACTION: swollen and sore mouth  . Terbinafine Hcl     Rash   .  Colchicine Other (See Comments)    REACTION: violent diarrhea  . Rosuvastatin Other (See Comments)    REACTION: upper arm pain bilaterally  . Vytorin [Ezetimibe-Simvastatin] Other (See Comments)    Nightmares with generic   Current Outpatient Prescriptions on File Prior to Visit  Medication Sig Dispense Refill  . cholecalciferol (VITAMIN D) 1000 UNITS tablet Take 1,000 Units by mouth daily.    Marland Kitchen co-enzyme Q-10 30 MG capsule Take 30 mg by mouth daily.    . furosemide (LASIX) 20 MG tablet Take 1 tablet (20 mg total) by mouth as needed. (Patient taking differently: Take 20 mg by mouth as needed for fluid. ) 30 tablet 1  .  levothyroxine (SYNTHROID, LEVOTHROID) 88 MCG tablet Take 1 tablet (88 mcg total) by mouth daily. 90 tablet 3  . warfarin (COUMADIN) 5 MG tablet TAKE AS DIRECTED. (Patient taking differently: Take 2.5-5 mg by mouth See admin instructions. Take 1/2 tablet (2.5 mg) on Monday and Friday then take 1 tablet (5 mg) all the other days) 30 tablet 3   No current facility-administered medications on file prior to visit.    Review of Systems  Constitutional: Negative for other unusual diaphoresis or sweats HENT: Negative for ear discharge or swelling Eyes: Negative for other worsening visual disturbances Respiratory: Negative for stridor or other swelling  Gastrointestinal: Negative for worsening distension or other blood Genitourinary: Negative for retention or other urinary change Musculoskeletal: Negative for other MSK pain or swelling Skin: Negative for color change or other new lesions Neurological: Negative for worsening tremors and other numbness  Psychiatric/Behavioral: Negative for worsening agitation or other fatigue All other system neg per pt    Objective:   Physical Exam  BP 112/70   Pulse 82   Ht 5\' 4"  (1.626 m)   Wt 126 lb (57.2 kg)   SpO2 99%   BMI 21.63 kg/m  VS noted,  Constitutional: Pt appears in NAD HENT: Head: NCAT.  Right Ear: External ear normal.  Left Ear: External ear normal.  Eyes: . Pupils are equal, round, and reactive to light. Conjunctivae and EOM are normal Nose: without d/c or deformity Neck: Neck supple. Gross normal ROM Cardiovascular: Normal rate and regular rhythm.   Pulmonary/Chest: Effort normal and breath sounds without rales or wheezing.  Abd:  Soft, NT, ND, + BS, no organomegaly Neurological: Pt is alert. At baseline orientation it seems but has some likely cognitive issue with slight memory and possibly judgement reduction,  motor grossly intact Skin: Skin is warm. No rashes, other new lesions, no LE edema Psychiatric: Pt behavior is normal  without agitation  No other exam findings    Assessment & Plan:

## 2016-06-06 NOTE — Assessment & Plan Note (Addendum)
Presumed vs tia (vs complicated migraine? Or even seizure?) not able to be proven in MRI as not MRI candidate, now on coumadin and overall stable to improved, likely back to baseline but has some mild evidence I suspect of underlying cognitive impairment, today for cbc bmp as recommended at d/c, to f/u any worsening symptoms or concerns  Note:  Total time for pt hx, exam, review of record with pt in the room, determination of diagnoses and plan for further eval and tx is > 40 min, with over 50% spent in coordination and counseling of patient

## 2016-06-06 NOTE — Progress Notes (Signed)
Pre visit review using our clinic review tool, if applicable. No additional management support is needed unless otherwise documented below in the visit note. 

## 2016-06-06 NOTE — Patient Instructions (Signed)
Pre visit review using our clinic review tool, if applicable. No additional management support is needed unless otherwise documented below in the visit note. 

## 2016-06-07 DIAGNOSIS — Z7901 Long term (current) use of anticoagulants: Secondary | ICD-10-CM | POA: Insufficient documentation

## 2016-06-07 NOTE — Assessment & Plan Note (Signed)
Improved, overall stable Lab Results  Component Value Date   CREATININE 1.28 (H) 06/06/2016  for f/u lab

## 2016-06-07 NOTE — Assessment & Plan Note (Signed)
Ok to restart Brand Vytorin as this is her preference, but I urged her to call if not covered well with insurance, as we could change to lipitor or crestor

## 2016-06-07 NOTE — Assessment & Plan Note (Signed)
Pt is on new coumadin, doing well with INR f/u, no overt bleeding, for f/u cbc today

## 2016-06-27 ENCOUNTER — Ambulatory Visit (INDEPENDENT_AMBULATORY_CARE_PROVIDER_SITE_OTHER): Payer: Medicare Other | Admitting: General Practice

## 2016-06-27 DIAGNOSIS — Z5181 Encounter for therapeutic drug level monitoring: Secondary | ICD-10-CM

## 2016-06-27 DIAGNOSIS — I4891 Unspecified atrial fibrillation: Secondary | ICD-10-CM

## 2016-06-27 LAB — POCT INR: INR: 1.8

## 2016-06-27 NOTE — Progress Notes (Signed)
I have reviewed and agree with the plan. 

## 2016-06-27 NOTE — Patient Instructions (Signed)
Pre visit review using our clinic review tool, if applicable. No additional management support is needed unless otherwise documented below in the visit note. 

## 2016-07-24 ENCOUNTER — Ambulatory Visit (INDEPENDENT_AMBULATORY_CARE_PROVIDER_SITE_OTHER): Payer: Medicare Other | Admitting: *Deleted

## 2016-07-24 DIAGNOSIS — H04123 Dry eye syndrome of bilateral lacrimal glands: Secondary | ICD-10-CM | POA: Diagnosis not present

## 2016-07-24 DIAGNOSIS — I428 Other cardiomyopathies: Secondary | ICD-10-CM

## 2016-07-24 DIAGNOSIS — G43809 Other migraine, not intractable, without status migrainosus: Secondary | ICD-10-CM | POA: Diagnosis not present

## 2016-07-24 DIAGNOSIS — H524 Presbyopia: Secondary | ICD-10-CM | POA: Diagnosis not present

## 2016-07-24 DIAGNOSIS — H25813 Combined forms of age-related cataract, bilateral: Secondary | ICD-10-CM | POA: Diagnosis not present

## 2016-07-24 DIAGNOSIS — H40013 Open angle with borderline findings, low risk, bilateral: Secondary | ICD-10-CM | POA: Diagnosis not present

## 2016-07-25 LAB — CUP PACEART REMOTE DEVICE CHECK
Battery Remaining Longevity: 72 mo
Battery Voltage: 2.98 V
Brady Statistic AP VP Percent: 0 %
Brady Statistic AS VP Percent: 99.96 %
Brady Statistic AS VS Percent: 0.04 %
Brady Statistic RV Percent Paced: 99.97 %
Date Time Interrogation Session: 20180607084226
HIGH POWER IMPEDANCE MEASURED VALUE: 45 Ohm
HighPow Impedance: 68 Ohm
Implantable Lead Implant Date: 20080908
Implantable Lead Implant Date: 20100121
Implantable Lead Location: 753858
Implantable Lead Model: 5076
Implantable Lead Model: 5076
Lead Channel Impedance Value: 456 Ohm
Lead Channel Impedance Value: 551 Ohm
Lead Channel Impedance Value: 931 Ohm
Lead Channel Pacing Threshold Amplitude: 0.625 V
Lead Channel Pacing Threshold Amplitude: 1.375 V
Lead Channel Sensing Intrinsic Amplitude: 2.125 mV
Lead Channel Sensing Intrinsic Amplitude: 2.125 mV
Lead Channel Setting Pacing Amplitude: 2.5 V
Lead Channel Setting Pacing Pulse Width: 0.4 ms
MDC IDC LEAD IMPLANT DT: 20100121
MDC IDC LEAD IMPLANT DT: 20130704
MDC IDC LEAD LOCATION: 753859
MDC IDC LEAD LOCATION: 753860
MDC IDC LEAD LOCATION: 753860
MDC IDC MSMT LEADCHNL LV IMPEDANCE VALUE: 456 Ohm
MDC IDC MSMT LEADCHNL LV PACING THRESHOLD PULSEWIDTH: 0.8 ms
MDC IDC MSMT LEADCHNL RV IMPEDANCE VALUE: 304 Ohm
MDC IDC MSMT LEADCHNL RV IMPEDANCE VALUE: 418 Ohm
MDC IDC MSMT LEADCHNL RV PACING THRESHOLD PULSEWIDTH: 0.4 ms
MDC IDC PG IMPLANT DT: 20171004
MDC IDC SET LEADCHNL LV PACING PULSEWIDTH: 0.8 ms
MDC IDC SET LEADCHNL RV PACING AMPLITUDE: 2 V
MDC IDC SET LEADCHNL RV SENSING SENSITIVITY: 0.3 mV
MDC IDC STAT BRADY AP VS PERCENT: 0 %
MDC IDC STAT BRADY RA PERCENT PACED: 0 %

## 2016-07-25 NOTE — Progress Notes (Signed)
Remote ICD transmission.   

## 2016-07-29 ENCOUNTER — Ambulatory Visit: Payer: Self-pay | Admitting: Nurse Practitioner

## 2016-07-30 ENCOUNTER — Encounter: Payer: Self-pay | Admitting: Cardiology

## 2016-08-01 ENCOUNTER — Ambulatory Visit (INDEPENDENT_AMBULATORY_CARE_PROVIDER_SITE_OTHER): Payer: Medicare Other | Admitting: General Practice

## 2016-08-01 DIAGNOSIS — Z5181 Encounter for therapeutic drug level monitoring: Secondary | ICD-10-CM | POA: Diagnosis not present

## 2016-08-01 DIAGNOSIS — I4891 Unspecified atrial fibrillation: Secondary | ICD-10-CM

## 2016-08-01 LAB — POCT INR: INR: 1.7

## 2016-08-01 NOTE — Patient Instructions (Signed)
Pre visit review using our clinic review tool, if applicable. No additional management support is needed unless otherwise documented below in the visit note. 

## 2016-08-14 ENCOUNTER — Encounter: Payer: Self-pay | Admitting: Cardiology

## 2016-08-19 ENCOUNTER — Telehealth: Payer: Self-pay | Admitting: Internal Medicine

## 2016-08-19 NOTE — Telephone Encounter (Signed)
Spoke with Michele [name] and informed her that according to our records she seen Dr. Caryl Comes in 04/2016, a home transmission was received on 6/7 and she was scheduled for another transmission on 10/23/2016. Pt voiced understanding.

## 2016-08-19 NOTE — Telephone Encounter (Signed)
New message       Calling to see when is her next device check appt?  Pt states that she has not had a device check this year.

## 2016-08-29 ENCOUNTER — Ambulatory Visit (INDEPENDENT_AMBULATORY_CARE_PROVIDER_SITE_OTHER): Payer: Medicare Other | Admitting: General Practice

## 2016-08-29 DIAGNOSIS — I4891 Unspecified atrial fibrillation: Secondary | ICD-10-CM | POA: Diagnosis not present

## 2016-08-29 DIAGNOSIS — Z5181 Encounter for therapeutic drug level monitoring: Secondary | ICD-10-CM | POA: Diagnosis not present

## 2016-08-29 LAB — POCT INR: INR: 2.3

## 2016-08-29 NOTE — Patient Instructions (Signed)
Pre visit review using our clinic review tool, if applicable. No additional management support is needed unless otherwise documented below in the visit note. 

## 2016-09-01 ENCOUNTER — Telehealth: Payer: Self-pay

## 2016-09-01 ENCOUNTER — Other Ambulatory Visit (INDEPENDENT_AMBULATORY_CARE_PROVIDER_SITE_OTHER): Payer: Medicare Other

## 2016-09-01 DIAGNOSIS — Z0000 Encounter for general adult medical examination without abnormal findings: Secondary | ICD-10-CM | POA: Diagnosis not present

## 2016-09-01 LAB — CBC WITH DIFFERENTIAL/PLATELET
Basophils Absolute: 0 10*3/uL (ref 0.0–0.1)
Basophils Relative: 0.3 % (ref 0.0–3.0)
EOS PCT: 1.2 % (ref 0.0–5.0)
Eosinophils Absolute: 0.1 10*3/uL (ref 0.0–0.7)
HCT: 38.9 % (ref 36.0–46.0)
HEMOGLOBIN: 13.5 g/dL (ref 12.0–15.0)
Lymphocytes Relative: 30.9 % (ref 12.0–46.0)
Lymphs Abs: 2.1 10*3/uL (ref 0.7–4.0)
MCHC: 34.7 g/dL (ref 30.0–36.0)
MCV: 88.8 fl (ref 78.0–100.0)
MONO ABS: 0.5 10*3/uL (ref 0.1–1.0)
Monocytes Relative: 6.7 % (ref 3.0–12.0)
Neutro Abs: 4.2 10*3/uL (ref 1.4–7.7)
Neutrophils Relative %: 60.9 % (ref 43.0–77.0)
Platelets: 196 10*3/uL (ref 150.0–400.0)
RBC: 4.38 Mil/uL (ref 3.87–5.11)
RDW: 13.9 % (ref 11.5–15.5)
WBC: 6.9 10*3/uL (ref 4.0–10.5)

## 2016-09-01 LAB — HEPATIC FUNCTION PANEL
ALK PHOS: 45 U/L (ref 39–117)
ALT: 10 U/L (ref 0–35)
AST: 16 U/L (ref 0–37)
Albumin: 3.9 g/dL (ref 3.5–5.2)
BILIRUBIN TOTAL: 1.5 mg/dL — AB (ref 0.2–1.2)
Bilirubin, Direct: 0.2 mg/dL (ref 0.0–0.3)
Total Protein: 6.7 g/dL (ref 6.0–8.3)

## 2016-09-01 LAB — LIPID PANEL
Cholesterol: 219 mg/dL — ABNORMAL HIGH (ref 0–200)
HDL: 46.8 mg/dL (ref >39.00)
LDL Cholesterol: 153 mg/dL — ABNORMAL HIGH (ref 0–99)
NONHDL: 172.09
Total CHOL/HDL Ratio: 5
Triglycerides: 96 mg/dL (ref 0.0–149.0)
VLDL: 19.2 mg/dL (ref 0.0–40.0)

## 2016-09-01 LAB — BASIC METABOLIC PANEL
BUN: 20 mg/dL (ref 6–23)
CALCIUM: 9.7 mg/dL (ref 8.4–10.5)
CO2: 27 mEq/L (ref 19–32)
CREATININE: 1.22 mg/dL — AB (ref 0.40–1.20)
Chloride: 102 mEq/L (ref 96–112)
GFR: 44.53 mL/min — AB (ref >60.00)
GLUCOSE: 101 mg/dL — AB (ref 70–99)
Potassium: 4.3 mEq/L (ref 3.5–5.1)
SODIUM: 137 meq/L (ref 135–145)

## 2016-09-01 LAB — URINALYSIS, ROUTINE W REFLEX MICROSCOPIC
Bilirubin Urine: NEGATIVE
Hgb urine dipstick: NEGATIVE
KETONES UR: NEGATIVE
Leukocytes, UA: NEGATIVE
Nitrite: NEGATIVE
RBC / HPF: NONE SEEN (ref 0–?)
SPECIFIC GRAVITY, URINE: 1.015 (ref 1.000–1.030)
Urine Glucose: NEGATIVE
Urobilinogen, UA: 0.2 (ref 0.0–1.0)
pH: 6 (ref 5.0–8.0)

## 2016-09-01 LAB — TSH: TSH: 0.03 u[IU]/mL — AB (ref 0.35–4.50)

## 2016-09-01 NOTE — Telephone Encounter (Signed)
error 

## 2016-09-01 NOTE — Telephone Encounter (Signed)
Routing to dr john---I have reordered patient's "future" labs previously ordered by you (but with expected date of august 31st)---the lab would not process these orders because there was an expected date on labs not close enough to today's date---after reviewing last office visit notes for this patient, it states that patient should return to get labs around 09/05/16, so I have reordered these labs for patient to get today---per lab, do not put expected dates on labs you order in the future

## 2016-09-01 NOTE — Telephone Encounter (Signed)
I decline to do this as this is not consistent with my medical practice, which is more sophisticated than the cookie cutter approach  thanks

## 2016-09-03 ENCOUNTER — Encounter: Payer: Self-pay | Admitting: Internal Medicine

## 2016-09-03 ENCOUNTER — Ambulatory Visit (INDEPENDENT_AMBULATORY_CARE_PROVIDER_SITE_OTHER): Payer: Medicare Other | Admitting: Internal Medicine

## 2016-09-03 VITALS — BP 126/78 | HR 86 | Ht 64.0 in | Wt 124.0 lb

## 2016-09-03 DIAGNOSIS — Z0000 Encounter for general adult medical examination without abnormal findings: Secondary | ICD-10-CM | POA: Diagnosis not present

## 2016-09-03 DIAGNOSIS — E2839 Other primary ovarian failure: Secondary | ICD-10-CM | POA: Diagnosis not present

## 2016-09-03 DIAGNOSIS — E890 Postprocedural hypothyroidism: Secondary | ICD-10-CM | POA: Diagnosis not present

## 2016-09-03 MED ORDER — LEVOTHYROXINE SODIUM 50 MCG PO TABS: 50.0000 ug | ORAL_TABLET | Freq: Every day | ORAL | 3 refills | Status: DC

## 2016-09-03 NOTE — Progress Notes (Signed)
Subjective:    Patient ID: Michele [name], female    DOB: 1929/03/05, 83 y.o.   MRN: [id number]  HPI  Here for wellness and f/u;  Overall doing ok;  Pt denies Chest pain, worsening SOB, DOE, wheezing, orthopnea, PND, worsening LE edema, palpitations, dizziness or syncope.  Pt denies neurological change such as new headache, facial or extremity weakness.  Pt denies polydipsia, polyuria, or low sugar symptoms. Pt states overall good compliance with treatment and medications, good tolerability, and has been trying to follow appropriate diet.  Pt denies worsening depressive symptoms, suicidal ideation or panic. No fever, night sweats, wt loss, loss of appetite, or other constitutional symptoms.  Pt states good ability with ADL's, has low fall risk, home safety reviewed and adequate, no other significant changes in hearing or vision, and only occasionally active with exercise. C/o chronic dizziness for 1 yr, has seen what sounds like vestibular rehab, but this type of dizzy seems to be a lightheaded for a few seconds.    Does not want statin as could not tolerate vytorin and crestor. Past Medical History:  Diagnosis Date  . Atrial fibrillation ([place])    NEG STRESS CARDIOLITE  . C. difficile colitis   . Cardiomyopathy    RATE RELATED  . CHF (congestive heart failure) ([place])   . Diverticulitis    PMH of X 2  . Diverticulosis    DIVERTICULITIS  . Gilbert's syndrome   . Hx of colonic polyp 2011   Dr Fuller Plan  . Hyperlipidemia    NMR LIOPROFILE LDL 234(3206/2234)HDL 37,TG86  . Renal insufficiency 12/09/2015  . Thyroid disease    HYPOTHYROIDISM...THYROID NODULES   Past Surgical History:  Procedure Laterality Date  . ABDOMINAL HYSTERECTOMY  1973  . APPENDECTOMY  1956  . BIOPSY THYROID     SINGLE NODULE  . BREAST BIOPSY  1997  . CARDIAC DEFIBRILLATOR PLACEMENT    . CHOLECYSTECTOMY  1972  . colonoscopy with polypectomy  2011  . EP IMPLANTABLE DEVICE N/A 11/21/2015   Procedure: BIV ICD Generator  Changeout;  Surgeon: Deboraha Sprang, MD;  Location: [facility];  Service: Cardiovascular;  Laterality: N/A;  . ICD....02/2008    . LEAD REVISION N/A 08/21/2011   Procedure: LEAD REVISION;  Surgeon: Deboraha Sprang, MD;  Location: [facility];  Service: Cardiovascular;  Laterality: N/A;  . PACEMAKER PLACEMENT     10/2006  . ROTATOR CUFF REPAIR     x2  . THROIDECTOMY 12/08     benign    reports that she has never smoked. She has never used smokeless tobacco. She reports that she does not drink alcohol or use drugs. family history includes Heart attack (age of onset: 55) in her brother; Heart attack (age of onset: 60) in her mother; Lung disease in her father; Ulcerative colitis in her son; Vasculitis in her sister. Allergies  Allergen Reactions  . Cephalexin Hives  . Erythromycin Swelling and Other (See Comments)    REACTION: swollen and sore mouth  . Terbinafine Hcl     Rash   . Colchicine Other (See Comments)    REACTION: violent diarrhea  . Crestor [Rosuvastatin Calcium] Other (See Comments)    Arm pain  . Rosuvastatin Other (See Comments)    REACTION: upper arm pain bilaterally  . Vytorin [Ezetimibe-Simvastatin] Other (See Comments)    Nightmares with generic   Current Outpatient Prescriptions on File Prior to Visit  Medication Sig Dispense Refill  . cholecalciferol (VITAMIN D)  1000 UNITS tablet Take 1,000 Units by mouth daily.    Marland Kitchen co-enzyme Q-10 30 MG capsule Take 30 mg by mouth daily.    . furosemide (LASIX) 20 MG tablet Take 1 tablet (20 mg total) by mouth as needed. (Patient taking differently: Take 20 mg by mouth as needed for fluid. ) 30 tablet 1  . VYTORIN 10-40 MG tablet Take 1 tablet by mouth daily. 90 tablet 3  . warfarin (COUMADIN) 5 MG tablet TAKE AS DIRECTED. (Patient taking differently: Take 2.5-5 mg by mouth See admin instructions. Take 1/2 tablet (2.5 mg) on Monday and Friday then take 1 tablet (5 mg) all the other days) 30 tablet 3   No current  facility-administered medications on file prior to visit.    Review of Systems Constitutional: Negative for other unusual diaphoresis, sweats, appetite or weight changes HENT: Negative for other worsening hearing loss, ear pain, facial swelling, mouth sores or neck stiffness.   Eyes: Negative for other worsening pain, redness or other visual disturbance.  Respiratory: Negative for other stridor or swelling Cardiovascular: Negative for other palpitations or other chest pain  Gastrointestinal: Negative for worsening diarrhea or loose stools, blood in stool, distention or other pain Genitourinary: Negative for hematuria, flank pain or other change in urine volume.  Musculoskeletal: Negative for myalgias or other joint swelling.  Skin: Negative for other color change, or other wound or worsening drainage.  Neurological: Negative for other syncope or numbness. Hematological: Negative for other adenopathy or swelling Psychiatric/Behavioral: Negative for hallucinations, other worsening agitation, SI, self-injury, or new decreased concentration All other system neg per pt    Objective:   Physical Exam BP 126/78   Pulse 86   Ht 5\' 4"  (1.626 m)   Wt 124 lb (56.2 kg)   SpO2 97%   BMI 21.28 kg/m  VS noted,  Constitutional: Pt is oriented to person, place, and time. Appears well-developed and well-nourished, in no significant distress and comfortable Head: Normocephalic and atraumatic  Eyes: Conjunctivae and EOM are normal. Pupils are equal, round, and reactive to light Right Ear: External ear normal without discharge Left Ear: External ear normal without discharge Nose: Nose without discharge or deformity Mouth/Throat: Oropharynx is without other ulcerations and moist  Neck: Normal range of motion. Neck supple. No JVD present. No tracheal deviation present or significant neck LA or mass Cardiovascular: Normal rate, regular rhythm, normal heart sounds and intact distal pulses.     Pulmonary/Chest: WOB normal and breath sounds without rales or wheezing  Abdominal: Soft. Bowel sounds are normal. NT. No HSM  Musculoskeletal: Normal range of motion. Exhibits no edema Lymphadenopathy: Has no other cervical adenopathy.  Neurological: Pt is alert and oriented to person, place, and time. Pt has normal reflexes. No cranial nerve deficit. Motor grossly intact, Gait intact Skin: Skin is warm and dry. No rash noted or new ulcerations Psychiatric:  Has normal mood and affect. Behavior is normal without agitation No other exam findings Lab Results  Component Value Date   WBC 6.9 09/01/2016   HGB 13.5 09/01/2016   HCT 38.9 09/01/2016   PLT 196.0 09/01/2016   GLUCOSE 101 (H) 09/01/2016   CHOL 219 (H) 09/01/2016   TRIG 96.0 09/01/2016   HDL 46.80 09/01/2016   LDLDIRECT 163.7 06/23/2011   LDLCALC 153 (H) 09/01/2016   ALT 10 09/01/2016   AST 16 09/01/2016   NA 137 09/01/2016   K 4.3 09/01/2016   CL 102 09/01/2016   CREATININE 1.22 (H) 09/01/2016  BUN 20 09/01/2016   CO2 27 09/01/2016   TSH 0.03 (L) 09/01/2016   INR 2.3 08/29/2016   HGBA1C 5.5 05/27/2016       Assessment & Plan:

## 2016-09-03 NOTE — Patient Instructions (Addendum)
Please schedule the bone density test before leaving today at the scheduling desk (where you check out)  Ok to decrease the levothyroxine to 50 mcg per day  Please return in 4 weeks to the LAB only for thyroid tests  Please continue all other medications as before, and refills have been done if requested.  Please have the pharmacy call with any other refills you may need.  Please continue your efforts at being more active, low cholesterol diet, and weight control.  You are otherwise up to date with prevention measures today.  Please keep your appointments with your specialists as you may have planned  Please return in 1 year for your yearly visit, or sooner if needed, with Lab testing done 3-5 days before

## 2016-09-06 NOTE — Assessment & Plan Note (Signed)

## 2016-09-06 NOTE — Assessment & Plan Note (Signed)
Overcontrolled, for reduced levothyroxine to 50, with f/u lab at 4 wks

## 2016-09-08 ENCOUNTER — Other Ambulatory Visit: Payer: Self-pay | Admitting: General Practice

## 2016-09-08 ENCOUNTER — Other Ambulatory Visit: Payer: Self-pay | Admitting: Internal Medicine

## 2016-09-08 MED ORDER — WARFARIN SODIUM 5 MG PO TABS: ORAL_TABLET | ORAL | 3 refills | Status: DC

## 2016-09-09 ENCOUNTER — Other Ambulatory Visit: Payer: Self-pay | Admitting: Internal Medicine

## 2016-09-26 ENCOUNTER — Ambulatory Visit (INDEPENDENT_AMBULATORY_CARE_PROVIDER_SITE_OTHER): Payer: Medicare Other

## 2016-09-26 ENCOUNTER — Ambulatory Visit: Payer: Medicare Other

## 2016-09-26 DIAGNOSIS — Z5181 Encounter for therapeutic drug level monitoring: Secondary | ICD-10-CM | POA: Diagnosis not present

## 2016-09-26 DIAGNOSIS — I4891 Unspecified atrial fibrillation: Secondary | ICD-10-CM

## 2016-09-26 LAB — POCT INR: INR: 2.2

## 2016-09-26 NOTE — Progress Notes (Signed)
I have reviewed and agree with the plan. 

## 2016-09-26 NOTE — Patient Instructions (Signed)
Pre visit review using our clinic review tool, if applicable. No additional management support is needed unless otherwise documented below in the visit note. 

## 2016-10-21 ENCOUNTER — Encounter: Payer: Self-pay | Admitting: Internal Medicine

## 2016-10-21 ENCOUNTER — Other Ambulatory Visit (INDEPENDENT_AMBULATORY_CARE_PROVIDER_SITE_OTHER): Payer: Medicare Other

## 2016-10-21 ENCOUNTER — Ambulatory Visit (INDEPENDENT_AMBULATORY_CARE_PROVIDER_SITE_OTHER): Payer: Medicare Other | Admitting: Internal Medicine

## 2016-10-21 VITALS — BP 110/78 | HR 90 | Temp 97.9°F | Ht 64.0 in | Wt 122.0 lb

## 2016-10-21 DIAGNOSIS — N183 Chronic kidney disease, stage 3 unspecified: Secondary | ICD-10-CM

## 2016-10-21 DIAGNOSIS — E890 Postprocedural hypothyroidism: Secondary | ICD-10-CM

## 2016-10-21 DIAGNOSIS — Z23 Encounter for immunization: Secondary | ICD-10-CM

## 2016-10-21 DIAGNOSIS — E785 Hyperlipidemia, unspecified: Secondary | ICD-10-CM | POA: Diagnosis not present

## 2016-10-21 LAB — T4, FREE: FREE T4: 1 ng/dL (ref 0.60–1.60)

## 2016-10-21 LAB — TSH: TSH: 6.49 u[IU]/mL — ABNORMAL HIGH (ref 0.35–4.50)

## 2016-10-21 MED ORDER — FUROSEMIDE 20 MG PO TABS: 20.0000 mg | ORAL_TABLET | ORAL | 0 refills | Status: DC | PRN

## 2016-10-21 MED ORDER — PITAVASTATIN CALCIUM 4 MG PO TABS: ORAL_TABLET | ORAL | 3 refills | Status: DC

## 2016-10-21 NOTE — Assessment & Plan Note (Signed)
Lab Results  Component Value Date   CREATININE 1.22 (H) 09/01/2016  stable overall by history and exam, recent data reviewed with pt, and pt to continue medical treatment as before,  to f/u any worsening symptoms or concerns

## 2016-10-21 NOTE — Assessment & Plan Note (Signed)
Lab Results  Component Value Date   LDLCALC 153 (H) 09/01/2016  Goal LDL < 70 with hx of cva, vytorin tolerated by not covered, will try livalo 4 mg daily

## 2016-10-21 NOTE — Patient Instructions (Addendum)
You had the flu shot today  Please take all new medication as prescribed - the livalo 4 mg per day  Please continue all other medications as before, and refills have been done if requested.  Please have the pharmacy call with any other refills you may need.  Please continue your efforts at being more active, low cholesterol diet, and weight control.  Please keep your appointments with your specialists as you may have planned  Please go to the LAB in the Basement (turn left off the elevator) for the tests to be done today - just the thyroid testing today  You will be contacted by phone if any changes need to be made immediately.  Otherwise, you will receive a letter about your results with an explanation, but please check with MyChart first.  Please return in 6 months, or sooner if needed

## 2016-10-21 NOTE — Progress Notes (Signed)
Subjective:    Patient ID: Michele [name], female    DOB: 1929/11/04, 83 y.o.   MRN: [id number]  HPI  Here to f/u; overall doing ok,  Pt denies chest pain, increasing sob or doe, wheezing, orthopnea, PND, increased LE swelling, palpitations, dizziness or syncope.  Pt denies new neurological symptoms such as new headache, or facial or extremity weakness or numbness.  Pt denies polydipsia, polyuria, or low sugar episode.  Pt states overall good compliance with meds, mostly trying to follow appropriate diet, with wt overall stable Wt Readings from Last 3 Encounters:  10/21/16 122 lb (55.3 kg)  09/03/16 124 lb (56.2 kg)  06/06/16 126 lb (57.2 kg)  Denies hyper or hypo thyroid symptoms such as voice, skin or hair change.  Due for flu shot.  Has been in tolerant lipitor and crestor, was ok with vytorin but not covered and did not tolerate generic due to nightmares. Willing to the livalo if covered Past Medical History:  Diagnosis Date  . Atrial fibrillation ([place])    NEG STRESS CARDIOLITE  . C. difficile colitis   . Cardiomyopathy    RATE RELATED  . CHF (congestive heart failure) ([place])   . Diverticulitis    PMH of X 2  . Diverticulosis    DIVERTICULITIS  . Gilbert's syndrome   . Hx of colonic polyp 2011   Dr Fuller Plan  . Hyperlipidemia    NMR LIOPROFILE LDL 234(3206/2234)HDL 37,TG86  . Renal insufficiency 12/09/2015  . Thyroid disease    HYPOTHYROIDISM...THYROID NODULES   Past Surgical History:  Procedure Laterality Date  . ABDOMINAL HYSTERECTOMY  1973  . APPENDECTOMY  1956  . BIOPSY THYROID     SINGLE NODULE  . BREAST BIOPSY  1997  . CARDIAC DEFIBRILLATOR PLACEMENT    . CHOLECYSTECTOMY  1972  . colonoscopy with polypectomy  2011  . EP IMPLANTABLE DEVICE N/A 11/21/2015   Procedure: BIV ICD Generator Changeout;  Surgeon: Deboraha Sprang, MD;  Location: [place] [facility];  Service: Cardiovascular;  Laterality: N/A;  . ICD....02/2008    . LEAD REVISION N/A 08/21/2011   Procedure: LEAD  REVISION;  Surgeon: Deboraha Sprang, MD;  Location: [facility] CATH LAB;  Service: Cardiovascular;  Laterality: N/A;  . PACEMAKER PLACEMENT     10/2006  . ROTATOR CUFF REPAIR     x2  . THROIDECTOMY 12/08     benign    reports that she has never smoked. She has never used smokeless tobacco. She reports that she does not drink alcohol or use drugs. family history includes Heart attack (age of onset: 31) in her brother; Heart attack (age of onset: 23) in her mother; Lung disease in her father; Ulcerative colitis in her son; Vasculitis in her sister. Allergies  Allergen Reactions  . Cephalexin Hives  . Erythromycin Swelling and Other (See Comments)    REACTION: swollen and sore mouth  . Terbinafine Hcl     Rash   . Colchicine Other (See Comments)    REACTION: violent diarrhea  . Crestor [Rosuvastatin Calcium] Other (See Comments)    Arm pain  . Rosuvastatin Other (See Comments)    REACTION: upper arm pain bilaterally  . Vytorin [Ezetimibe-Simvastatin] Other (See Comments)    Nightmares with generic   Current Outpatient Prescriptions on File Prior to Visit  Medication Sig Dispense Refill  . cholecalciferol (VITAMIN D) 1000 UNITS tablet Take 1,000 Units by mouth daily.    Marland Kitchen co-enzyme Q-10 30 MG capsule Take 30 mg  by mouth daily.    Marland Kitchen levothyroxine (SYNTHROID, LEVOTHROID) 50 MCG tablet Take 1 tablet (50 mcg total) by mouth daily. 90 tablet 3  . VYTORIN 10-40 MG tablet Take 1 tablet by mouth daily. 90 tablet 3  . warfarin (COUMADIN) 5 MG tablet TAKE AS DIRECTED. 35 tablet 3   No current facility-administered medications on file prior to visit.    Review of Systems  Constitutional: Negative for other unusual diaphoresis or sweats HENT: Negative for ear discharge or swelling Eyes: Negative for other worsening visual disturbances Respiratory: Negative for stridor or other swelling  Gastrointestinal: Negative for worsening distension or other blood Genitourinary: Negative for retention or other  urinary change Musculoskeletal: Negative for other MSK pain or swelling Skin: Negative for color change or other new lesions Neurological: Negative for worsening tremors and other numbness  Psychiatric/Behavioral: Negative for worsening agitation or other fatigue All other system neg per pt    Objective:   Physical Exam BP 110/78   Pulse 90   Temp 97.9 F (36.6 C) (Oral)   Ht 5\' 4"  (1.626 m)   Wt 122 lb (55.3 kg)   SpO2 98%   BMI 20.94 kg/m  VS noted,  Constitutional: Pt appears in NAD HENT: Head: NCAT.  Right Ear: External ear normal.  Left Ear: External ear normal.  Eyes: . Pupils are equal, round, and reactive to light. Conjunctivae and EOM are normal Nose: without d/c or deformity Neck: Neck supple. Gross normal ROM Cardiovascular: Normal rate and regular rhythm.   Pulmonary/Chest: Effort normal and breath sounds without rales or wheezing.  Neurological: Pt is alert. At baseline orientation - + mild cognitive impaired, motor grossly intact Skin: Skin is warm. No rashes, other new lesions, no LE edema Psychiatric: Pt behavior is normal without agitation  No other exam findings  Lab Results  Component Value Date   WBC 6.9 09/01/2016   HGB 13.5 09/01/2016   HCT 38.9 09/01/2016   PLT 196.0 09/01/2016   GLUCOSE 101 (H) 09/01/2016   CHOL 219 (H) 09/01/2016   TRIG 96.0 09/01/2016   HDL 46.80 09/01/2016   LDLDIRECT 163.7 06/23/2011   LDLCALC 153 (H) 09/01/2016   ALT 10 09/01/2016   AST 16 09/01/2016   NA 137 09/01/2016   K 4.3 09/01/2016   CL 102 09/01/2016   CREATININE 1.22 (H) 09/01/2016   BUN 20 09/01/2016   CO2 27 09/01/2016   TSH 0.03 (L) 09/01/2016   INR 2.2 09/26/2016   HGBA1C 5.5 05/27/2016       Assessment & Plan:

## 2016-10-21 NOTE — Assessment & Plan Note (Signed)
TSH goal 1-4, pt recently overcontrolled and levothyoxine reduced; for f/u lab today o/w stable overall by history and exam, recent data reviewed with pt, and pt to continue medical treatment as before,  to f/u any worsening symptoms or concerns

## 2016-10-22 ENCOUNTER — Telehealth: Payer: Self-pay | Admitting: Internal Medicine

## 2016-10-22 NOTE — Telephone Encounter (Signed)
Notified patient.  She seems very confused.  Keeps wanting to call her Dexa either a coum test or a kidney test.  I have explained this to her several times, even explaining each appointment scheduled on her appt desk.  Still not sure if she understands.

## 2016-10-22 NOTE — Telephone Encounter (Signed)
Please advise. I only see thyroid test that were ordered during her OV yesterday.

## 2016-10-22 NOTE — Telephone Encounter (Signed)
Here last testing was just done mid July 2018 and stable  I think pt would not need further lab testing at this time, thanks

## 2016-10-22 NOTE — Telephone Encounter (Signed)
Patient would like a call back.  States Dr. Jenny Reichmann was wanting her to do a "test" on her kidneys.  Patient would like more information in regard.

## 2016-10-23 ENCOUNTER — Ambulatory Visit (INDEPENDENT_AMBULATORY_CARE_PROVIDER_SITE_OTHER): Payer: Medicare Other | Admitting: *Deleted

## 2016-10-23 DIAGNOSIS — I442 Atrioventricular block, complete: Secondary | ICD-10-CM | POA: Diagnosis not present

## 2016-10-24 ENCOUNTER — Ambulatory Visit: Payer: Medicare Other

## 2016-10-24 NOTE — Progress Notes (Signed)
Remote ICD transmission.   

## 2016-10-28 ENCOUNTER — Encounter: Payer: Self-pay | Admitting: Cardiology

## 2016-11-05 ENCOUNTER — Ambulatory Visit (INDEPENDENT_AMBULATORY_CARE_PROVIDER_SITE_OTHER)
Admission: RE | Admit: 2016-11-05 | Discharge: 2016-11-05 | Disposition: A | Discharge status: Home / Self Care | Payer: Medicare Other | Source: Ambulatory Visit | Attending: Internal Medicine | Admitting: Internal Medicine

## 2016-11-05 ENCOUNTER — Other Ambulatory Visit: Payer: Medicare Other

## 2016-11-05 DIAGNOSIS — E2839 Other primary ovarian failure: Secondary | ICD-10-CM | POA: Diagnosis not present

## 2016-11-12 ENCOUNTER — Telehealth: Payer: Self-pay | Admitting: Internal Medicine

## 2016-11-12 DIAGNOSIS — N183 Chronic kidney disease, stage 3 unspecified: Secondary | ICD-10-CM

## 2016-11-12 NOTE — Telephone Encounter (Signed)
New message    Patient states she is having pain at ICD site, pain under arm for about 1 month.  1. Has your device fired? no  2. Is you device beeping? no  3. Are you experiencing draining or swelling at device site? no  4. Are you calling to see if we received your device transmission? no  5. Have you passed out? no

## 2016-11-12 NOTE — Telephone Encounter (Signed)
Michele [name] c/o "dull ache" in the area around her ICD. She denies redness, bruising, swelling, drainage around ICD or fever/chills. She reports that she has lost a lot of weight and notices the aching more in the morning and once she's gotten dressed and moved around it seems to dissipate. I advised her that the aching may be related to her change in size and the device prominence due to weight loss and to call us if it worsens or if she develops any redness, swelling or drainage at the site. I recommended taking OTC tylenol if she felt she needed pain relief. She verbalizes understanding.  She mentioned that her PCP told her she had kidney disease based on her blood work results and told her to follow up in 6 months. She isn't comfortable waiting 6 months and she would like Michele [name] to refer her to someone who knows about kidney disease. I let her know that if her PCP felt she needed to see a nephrologist they would have referred her and if she wanted to be seen sooner than 6 months to call her PCP back to have earlier follow-up. I also advised her to voice her concerns to Michele [name] (PCP). She thanked me and promptly got off of the phone. I will route this note to Michele [name] to make him aware.

## 2016-11-12 NOTE — Telephone Encounter (Signed)
[facility] for renal referal - CKD - 3

## 2016-11-12 NOTE — Addendum Note (Signed)
Addended by: Biagio Borg on: 11/12/2016 12:45 PM   Modules accepted: Orders

## 2016-11-14 ENCOUNTER — Ambulatory Visit (INDEPENDENT_AMBULATORY_CARE_PROVIDER_SITE_OTHER): Payer: Medicare Other | Admitting: General Practice

## 2016-11-14 DIAGNOSIS — I4891 Unspecified atrial fibrillation: Secondary | ICD-10-CM | POA: Diagnosis not present

## 2016-11-14 DIAGNOSIS — Z5181 Encounter for therapeutic drug level monitoring: Secondary | ICD-10-CM

## 2016-11-14 DIAGNOSIS — Z7901 Long term (current) use of anticoagulants: Secondary | ICD-10-CM

## 2016-11-14 LAB — CUP PACEART REMOTE DEVICE CHECK
Battery Remaining Longevity: 70 mo
Battery Voltage: 2.97 V
Brady Statistic AS VS Percent: 0.03 %
HIGH POWER IMPEDANCE MEASURED VALUE: 48 Ohm
HIGH POWER IMPEDANCE MEASURED VALUE: 71 Ohm
Implantable Lead Implant Date: 20100121
Implantable Lead Implant Date: 20100121
Implantable Lead Implant Date: 20130704
Implantable Lead Location: 753860
Implantable Lead Model: 4196
Implantable Lead Model: 5076
Implantable Lead Model: 5076
Implantable Pulse Generator Implant Date: 20171004
Lead Channel Impedance Value: 304 Ohm
Lead Channel Impedance Value: 893 Ohm
Lead Channel Pacing Threshold Amplitude: 0.75 V
Lead Channel Pacing Threshold Amplitude: 1.375 V
Lead Channel Pacing Threshold Pulse Width: 0.4 ms
Lead Channel Sensing Intrinsic Amplitude: 1.75 mV
Lead Channel Setting Pacing Amplitude: 2 V
Lead Channel Setting Pacing Amplitude: 2.5 V
Lead Channel Setting Pacing Pulse Width: 0.4 ms
Lead Channel Setting Sensing Sensitivity: 0.3 mV
MDC IDC LEAD IMPLANT DT: 20080908
MDC IDC LEAD LOCATION: 753858
MDC IDC LEAD LOCATION: 753859
MDC IDC LEAD LOCATION: 753860
MDC IDC MSMT LEADCHNL LV IMPEDANCE VALUE: 456 Ohm
MDC IDC MSMT LEADCHNL LV IMPEDANCE VALUE: 532 Ohm
MDC IDC MSMT LEADCHNL LV PACING THRESHOLD PULSEWIDTH: 0.8 ms
MDC IDC MSMT LEADCHNL RA IMPEDANCE VALUE: 513 Ohm
MDC IDC MSMT LEADCHNL RA SENSING INTR AMPL: 1.75 mV
MDC IDC MSMT LEADCHNL RV IMPEDANCE VALUE: 456 Ohm
MDC IDC SESS DTM: 20180906073523
MDC IDC SET LEADCHNL LV PACING PULSEWIDTH: 0.8 ms
MDC IDC STAT BRADY AP VP PERCENT: 0 %
MDC IDC STAT BRADY AP VS PERCENT: 0 %
MDC IDC STAT BRADY AS VP PERCENT: 99.97 %
MDC IDC STAT BRADY RA PERCENT PACED: 0 %
MDC IDC STAT BRADY RV PERCENT PACED: 99.98 %

## 2016-11-14 LAB — POCT INR: INR: 2.4

## 2016-11-14 NOTE — Progress Notes (Signed)
I have reviewed and agree with the plan. 

## 2016-11-14 NOTE — Patient Instructions (Signed)
Pre visit review using our clinic review tool, if applicable. No additional management support is needed unless otherwise documented below in the visit note. 

## 2016-11-25 ENCOUNTER — Other Ambulatory Visit: Payer: Self-pay | Admitting: Obstetrics and Gynecology

## 2016-11-25 DIAGNOSIS — N644 Mastodynia: Secondary | ICD-10-CM | POA: Diagnosis not present

## 2016-11-25 DIAGNOSIS — R7989 Other specified abnormal findings of blood chemistry: Secondary | ICD-10-CM | POA: Diagnosis not present

## 2016-11-25 DIAGNOSIS — N944 Primary dysmenorrhea: Secondary | ICD-10-CM | POA: Diagnosis not present

## 2016-11-28 ENCOUNTER — Ambulatory Visit
Admission: RE | Admit: 2016-11-28 | Discharge: 2016-11-28 | Disposition: A | Discharge status: Home / Self Care | Payer: Medicare Other | Source: Ambulatory Visit | Attending: Obstetrics and Gynecology | Admitting: Obstetrics and Gynecology

## 2016-11-28 DIAGNOSIS — N644 Mastodynia: Secondary | ICD-10-CM

## 2016-11-28 DIAGNOSIS — R928 Other abnormal and inconclusive findings on diagnostic imaging of breast: Secondary | ICD-10-CM | POA: Diagnosis not present

## 2016-12-02 ENCOUNTER — Other Ambulatory Visit: Payer: Self-pay | Admitting: Internal Medicine

## 2016-12-05 ENCOUNTER — Telehealth: Payer: Self-pay | Admitting: Internal Medicine

## 2016-12-05 NOTE — Telephone Encounter (Signed)
Patient denies redness around site, cannot be specific about swelling. She is vague about symptoms, says she has had a cold for a month and cannot be sure her fever/chills are related to anything else. I asked her to come to the [facility] today for [place] to evaluate ICD site- she declines and would like to see Dr. Caryl Comes. I let her know that Dr. Caryl Comes is not in the office today, nor are his colleagues but if she comes and I need a provider to see her that there would be one here to assess her. She again states how she needs to see Dr. Caryl Comes. I cannot see that she has a general cardiologist other than Dr. Caryl Comes.  I put her on hold to review her chart. I spoke with Ms. Kant on 11/12/16 d/t complaints of pain around her site- she denied fever, chills, redness or swelling. She went on to speak about kidney function and lab results. No follow up.  She reports the pain is now worse and that she went to the breast center for a mammogram and ultrasound and that they were normal. When asked if the pain was in her breast, she reports it is mainly around ICD, under arm and into the top portion of her breast. She again denies redness. I advised her to come to the [facility] to be evaluated and that I can have a provider see her if needed. She then states "oh forget it, forget it, goodbye" and hangs up the phone.

## 2016-12-05 NOTE — Telephone Encounter (Signed)
Patient called and stated that she has having bad pain around the ICD. She stated that she went to the breast center and was told the pain is not related to her breast. She is requesting an appt w/ MD. Call routed to [place].

## 2016-12-05 NOTE — Telephone Encounter (Signed)
°  1. Has your device fired? Yes, chest pain  2. Is you device beeping? no  3. Are you experiencing draining or swelling at device site? no  4. Are you calling to see if we received your device transmission? no  5. Have you passed out? no    Please route to [place]

## 2016-12-09 DIAGNOSIS — R944 Abnormal results of kidney function studies: Secondary | ICD-10-CM | POA: Diagnosis not present

## 2016-12-09 DIAGNOSIS — N644 Mastodynia: Secondary | ICD-10-CM | POA: Diagnosis not present

## 2016-12-26 ENCOUNTER — Other Ambulatory Visit: Payer: Self-pay | Admitting: Nephrology

## 2016-12-26 ENCOUNTER — Ambulatory Visit (INDEPENDENT_AMBULATORY_CARE_PROVIDER_SITE_OTHER): Payer: Medicare Other | Admitting: General Practice

## 2016-12-26 DIAGNOSIS — N183 Chronic kidney disease, stage 3 unspecified: Secondary | ICD-10-CM

## 2016-12-26 DIAGNOSIS — R809 Proteinuria, unspecified: Secondary | ICD-10-CM | POA: Diagnosis not present

## 2016-12-26 DIAGNOSIS — Z5181 Encounter for therapeutic drug level monitoring: Secondary | ICD-10-CM | POA: Diagnosis not present

## 2016-12-26 DIAGNOSIS — I4891 Unspecified atrial fibrillation: Secondary | ICD-10-CM | POA: Diagnosis not present

## 2016-12-26 DIAGNOSIS — I509 Heart failure, unspecified: Secondary | ICD-10-CM | POA: Diagnosis not present

## 2016-12-26 LAB — POCT INR: INR: 2.1

## 2016-12-26 NOTE — Patient Instructions (Signed)
Pre visit review using our clinic review tool, if applicable. No additional management support is needed unless otherwise documented below in the visit note. 

## 2016-12-27 ENCOUNTER — Other Ambulatory Visit: Payer: Self-pay | Admitting: Internal Medicine

## 2017-01-05 ENCOUNTER — Ambulatory Visit
Admission: RE | Admit: 2017-01-05 | Discharge: 2017-01-05 | Disposition: A | Discharge status: Home / Self Care | Payer: Medicare Other | Source: Ambulatory Visit | Attending: Nephrology | Admitting: Nephrology

## 2017-01-05 DIAGNOSIS — N183 Chronic kidney disease, stage 3 unspecified: Secondary | ICD-10-CM

## 2017-01-22 ENCOUNTER — Other Ambulatory Visit: Payer: Self-pay

## 2017-01-22 ENCOUNTER — Ambulatory Visit (INDEPENDENT_AMBULATORY_CARE_PROVIDER_SITE_OTHER): Payer: Medicare Other | Admitting: *Deleted

## 2017-01-22 ENCOUNTER — Emergency Department (HOSPITAL_COMMUNITY): Payer: Medicare Other

## 2017-01-22 ENCOUNTER — Encounter (HOSPITAL_COMMUNITY): Payer: Self-pay | Admitting: Emergency Medicine

## 2017-01-22 ENCOUNTER — Observation Stay (HOSPITAL_COMMUNITY)
Admission: EM | Admit: 2017-01-22 | Discharge: 2017-01-24 | Disposition: A | Discharge status: Home / Self Care | Payer: Medicare Other | Attending: Internal Medicine | Admitting: Internal Medicine

## 2017-01-22 DIAGNOSIS — F984 Stereotyped movement disorders: Secondary | ICD-10-CM | POA: Diagnosis not present

## 2017-01-22 DIAGNOSIS — E876 Hypokalemia: Secondary | ICD-10-CM | POA: Diagnosis not present

## 2017-01-22 DIAGNOSIS — Z79899 Other long term (current) drug therapy: Secondary | ICD-10-CM | POA: Insufficient documentation

## 2017-01-22 DIAGNOSIS — F809 Developmental disorder of speech and language, unspecified: Secondary | ICD-10-CM | POA: Diagnosis not present

## 2017-01-22 DIAGNOSIS — I482 Chronic atrial fibrillation: Secondary | ICD-10-CM

## 2017-01-22 DIAGNOSIS — E890 Postprocedural hypothyroidism: Secondary | ICD-10-CM | POA: Diagnosis present

## 2017-01-22 DIAGNOSIS — R479 Unspecified speech disturbances: Secondary | ICD-10-CM

## 2017-01-22 DIAGNOSIS — N183 Chronic kidney disease, stage 3 unspecified: Secondary | ICD-10-CM | POA: Diagnosis present

## 2017-01-22 DIAGNOSIS — R2689 Other abnormalities of gait and mobility: Secondary | ICD-10-CM | POA: Insufficient documentation

## 2017-01-22 DIAGNOSIS — R404 Transient alteration of awareness: Secondary | ICD-10-CM | POA: Diagnosis not present

## 2017-01-22 DIAGNOSIS — R918 Other nonspecific abnormal finding of lung field: Secondary | ICD-10-CM | POA: Diagnosis not present

## 2017-01-22 DIAGNOSIS — E039 Hypothyroidism, unspecified: Secondary | ICD-10-CM | POA: Diagnosis not present

## 2017-01-22 DIAGNOSIS — R55 Syncope and collapse: Secondary | ICD-10-CM | POA: Diagnosis not present

## 2017-01-22 DIAGNOSIS — R29818 Other symptoms and signs involving the nervous system: Secondary | ICD-10-CM | POA: Diagnosis not present

## 2017-01-22 DIAGNOSIS — I428 Other cardiomyopathies: Secondary | ICD-10-CM

## 2017-01-22 DIAGNOSIS — I509 Heart failure, unspecified: Secondary | ICD-10-CM | POA: Diagnosis not present

## 2017-01-22 DIAGNOSIS — E785 Hyperlipidemia, unspecified: Secondary | ICD-10-CM | POA: Diagnosis present

## 2017-01-22 DIAGNOSIS — R4701 Aphasia: Secondary | ICD-10-CM | POA: Diagnosis present

## 2017-01-22 DIAGNOSIS — I4891 Unspecified atrial fibrillation: Secondary | ICD-10-CM | POA: Diagnosis present

## 2017-01-22 DIAGNOSIS — Z95810 Presence of automatic (implantable) cardiac defibrillator: Secondary | ICD-10-CM | POA: Diagnosis not present

## 2017-01-22 LAB — COMPREHENSIVE METABOLIC PANEL
ALT: 17 U/L (ref 14–54)
AST: 27 U/L (ref 15–41)
Albumin: 3.9 g/dL (ref 3.5–5.0)
Alkaline Phosphatase: 54 U/L (ref 38–126)
Anion gap: 12 (ref 5–15)
BUN: 22 mg/dL — AB (ref 6–20)
CHLORIDE: 101 mmol/L (ref 101–111)
CO2: 21 mmol/L — AB (ref 22–32)
CREATININE: 1.5 mg/dL — AB (ref 0.44–1.00)
Calcium: 8.7 mg/dL — ABNORMAL LOW (ref 8.9–10.3)
GFR calc Af Amer: 35 mL/min — ABNORMAL LOW (ref >60)
GFR calc non Af Amer: 31 mL/min — ABNORMAL LOW (ref >60)
Glucose, Bld: 91 mg/dL (ref 65–99)
POTASSIUM: 3.7 mmol/L (ref 3.5–5.1)
SODIUM: 134 mmol/L — AB (ref 135–145)
Total Bilirubin: 1.9 mg/dL — ABNORMAL HIGH (ref 0.3–1.2)
Total Protein: 6.7 g/dL (ref 6.5–8.1)

## 2017-01-22 LAB — RAPID URINE DRUG SCREEN, HOSP PERFORMED
AMPHETAMINES: NOT DETECTED
BARBITURATES: NOT DETECTED
BENZODIAZEPINES: NOT DETECTED
Cocaine: NOT DETECTED
Opiates: NOT DETECTED
Tetrahydrocannabinol: NOT DETECTED

## 2017-01-22 LAB — DIFFERENTIAL
BASOS ABS: 0 10*3/uL (ref 0.0–0.1)
BASOS PCT: 0 %
Eosinophils Absolute: 0.1 10*3/uL (ref 0.0–0.7)
Eosinophils Relative: 1 %
Lymphocytes Relative: 38 %
Lymphs Abs: 2.4 10*3/uL (ref 0.7–4.0)
Monocytes Absolute: 0.4 10*3/uL (ref 0.1–1.0)
Monocytes Relative: 6 %
NEUTROS ABS: 3.5 10*3/uL (ref 1.7–7.7)
NEUTROS PCT: 55 %

## 2017-01-22 LAB — URINALYSIS, ROUTINE W REFLEX MICROSCOPIC
Bilirubin Urine: NEGATIVE
Glucose, UA: NEGATIVE mg/dL
HGB URINE DIPSTICK: NEGATIVE
Ketones, ur: 5 mg/dL — AB
Leukocytes, UA: NEGATIVE
Nitrite: NEGATIVE
Protein, ur: NEGATIVE mg/dL
SPECIFIC GRAVITY, URINE: 1.009 (ref 1.005–1.030)
pH: 7 (ref 5.0–8.0)

## 2017-01-22 LAB — I-STAT CHEM 8, ED
BUN: 24 mg/dL — ABNORMAL HIGH (ref 6–20)
CALCIUM ION: 1.06 mmol/L — AB (ref 1.15–1.40)
CHLORIDE: 104 mmol/L (ref 101–111)
Creatinine, Ser: 1.4 mg/dL — ABNORMAL HIGH (ref 0.44–1.00)
GLUCOSE: 92 mg/dL (ref 65–99)
HCT: 37 % (ref 36.0–46.0)
HEMOGLOBIN: 12.6 g/dL (ref 12.0–15.0)
POTASSIUM: 3.8 mmol/L (ref 3.5–5.1)
SODIUM: 139 mmol/L (ref 135–145)
TCO2: 22 mmol/L (ref 22–32)

## 2017-01-22 LAB — CBC
HCT: 37.3 % (ref 36.0–46.0)
Hemoglobin: 13.2 g/dL (ref 12.0–15.0)
MCH: 31.7 pg (ref 26.0–34.0)
MCHC: 35.4 g/dL (ref 30.0–36.0)
MCV: 89.4 fL (ref 78.0–100.0)
PLATELETS: 189 10*3/uL (ref 150–400)
RBC: 4.17 MIL/uL (ref 3.87–5.11)
RDW: 13.6 % (ref 11.5–15.5)
WBC: 6.3 10*3/uL (ref 4.0–10.5)

## 2017-01-22 LAB — I-STAT TROPONIN, ED: Troponin i, poc: 0.01 ng/mL (ref 0.00–0.08)

## 2017-01-22 LAB — PROTIME-INR
INR: 2.07
PROTHROMBIN TIME: 23.1 s — AB (ref 11.4–15.2)

## 2017-01-22 LAB — APTT: APTT: 41 s — AB (ref 24–36)

## 2017-01-22 LAB — ETHANOL

## 2017-01-22 MED ORDER — SODIUM CHLORIDE 0.9 % IV SOLN
INTRAVENOUS | Status: AC
  Administered 2017-01-23: 01:00:00 via INTRAVENOUS

## 2017-01-22 MED ORDER — LEVOTHYROXINE SODIUM 50 MCG PO TABS
50.0000 ug | ORAL_TABLET | Freq: Every day | ORAL | Status: DC
  Administered 2017-01-23 – 2017-01-24 (×2): 50 ug via ORAL
  Filled 2017-01-22 (×3): qty 1

## 2017-01-22 MED ORDER — ACETAMINOPHEN 650 MG RE SUPP: 650.0000 mg | RECTAL | Status: DC | PRN

## 2017-01-22 MED ORDER — SENNOSIDES-DOCUSATE SODIUM 8.6-50 MG PO TABS: 1.0000 | ORAL_TABLET | Freq: Every evening | ORAL | Status: DC | PRN

## 2017-01-22 MED ORDER — STROKE: EARLY STAGES OF RECOVERY BOOK
Freq: Once | Status: AC
  Administered 2017-01-23: 01:00:00
  Filled 2017-01-22: qty 1

## 2017-01-22 MED ORDER — ACETAMINOPHEN 325 MG PO TABS: 650.0000 mg | ORAL_TABLET | ORAL | Status: DC | PRN

## 2017-01-22 MED ORDER — IOPAMIDOL (ISOVUE-370) INJECTION 76%
INTRAVENOUS | Status: AC
  Administered 2017-01-23: 50 mL
  Filled 2017-01-22: qty 50

## 2017-01-22 MED ORDER — VITAMIN D 1000 UNITS PO TABS
1000.0000 [IU] | ORAL_TABLET | Freq: Every day | ORAL | Status: DC
  Administered 2017-01-23 – 2017-01-24 (×2): 1000 [IU] via ORAL
  Filled 2017-01-22 (×2): qty 1

## 2017-01-22 MED ORDER — WARFARIN SODIUM 5 MG PO TABS
5.0000 mg | ORAL_TABLET | Freq: Every day | ORAL | Status: DC
  Administered 2017-01-23: 5 mg via ORAL
  Filled 2017-01-22 (×2): qty 1

## 2017-01-22 MED ORDER — ACETAMINOPHEN 160 MG/5ML PO SOLN: 650.0000 mg | ORAL | Status: DC | PRN

## 2017-01-22 MED ORDER — EZETIMIBE-SIMVASTATIN 10-40 MG PO TABS
1.0000 | ORAL_TABLET | Freq: Every day | ORAL | Status: DC
  Administered 2017-01-23 – 2017-01-24 (×2): 1 via ORAL
  Filled 2017-01-22 (×2): qty 1

## 2017-01-22 MED ORDER — WARFARIN - PHARMACIST DOSING INPATIENT: Freq: Every day | Status: DC

## 2017-01-22 NOTE — Consult Note (Signed)
Requesting Physician: Dr. Billy Fischer    Chief Complaint: Aphasia, syncope   History obtained from: Patient and Chart    HPI:                                                                                                                                       Michele [name] is an 83 y.o. female  with history of AF, CM w/ CHF, HLD, diverticulosis, thyroid disease and renal insuffiency who presents to [facility] ER for syncope while a grocery store. On arrival patient was having difficulty getting words out and stuttering and stroke alert was called.  On assessment, patient was stuttering but able to answer all questions and follow all commands. She appeared anxious and had tremors. Stroke alert was cancelled.    Past Medical History:  Diagnosis Date  . Atrial fibrillation ([place])    NEG STRESS CARDIOLITE  . C. difficile colitis   . Cardiomyopathy    RATE RELATED  . CHF (congestive heart failure) ([place])   . Diverticulitis    PMH of X 2  . Diverticulosis    DIVERTICULITIS  . Gilbert's syndrome   . Hx of colonic polyp 2011   Dr Fuller Plan  . Hyperlipidemia    NMR LIOPROFILE LDL 234(3206/2234)HDL 37,TG86  . Renal insufficiency 12/09/2015  . Thyroid disease    HYPOTHYROIDISM...THYROID NODULES    Past Surgical History:  Procedure Laterality Date  . ABDOMINAL HYSTERECTOMY  1973  . APPENDECTOMY  1956  . BIOPSY THYROID     SINGLE NODULE  . BREAST BIOPSY  1997  . CARDIAC DEFIBRILLATOR PLACEMENT    . CHOLECYSTECTOMY  1972  . colonoscopy with polypectomy  2011  . EP IMPLANTABLE DEVICE N/A 11/21/2015   Procedure: BIV ICD Generator Changeout;  Surgeon: Deboraha Sprang, MD;  Location: [facility];  Service: Cardiovascular;  Laterality: N/A;  . ICD....02/2008    . LEAD REVISION N/A 08/21/2011   Procedure: LEAD REVISION;  Surgeon: Deboraha Sprang, MD;  Location: [facility] CATH LAB;  Service: Cardiovascular;  Laterality: N/A;  . PACEMAKER PLACEMENT     10/2006  . ROTATOR CUFF REPAIR     x2  .  THROIDECTOMY 12/08     benign    Family History  Problem Relation Age of Onset  . Heart attack Mother 38  . Breast cancer Sister   . Heart attack Brother 47  . Lung disease Father        Silicosis  . Vasculitis Sister        Giant Cell Arteritis  . Ulcerative colitis Son    Social History:  reports that  has never smoked. she has never used smokeless tobacco. She reports that she does not drink alcohol or use drugs.  Allergies:  Allergies  Allergen Reactions  . Cephalexin Hives  . Erythromycin Swelling and Other (See Comments)    REACTION: swollen and sore mouth  .  Terbinafine Hcl     Rash   . Colchicine Other (See Comments)    REACTION: violent diarrhea  . Crestor [Rosuvastatin Calcium] Other (See Comments)    Arm pain  . Rosuvastatin Other (See Comments)    REACTION: upper arm pain bilaterally  . Vytorin [Ezetimibe-Simvastatin] Other (See Comments)    Nightmares with generic    Medications:                                                                                                                        I reviewed home medications   ROS:                                                                                                                                     14 systems reviewed and negative except above    Examination:                                                                                                      General: Slender,appears anxious  Psych: Affect appropriate to situation Eyes: No scleral injection HENT: No OP obstrucion Head: Normocephalic.  Cardiovascular: Normal rate and regular rhythm.  Respiratory: Effort normal and breath sounds normal to anterior ascultation GI: Soft.  No distension. There is no tenderness.  Skin: WDI   Neurological Examination Mental Status: Alert, oriented, thought content appropriate.  Speech fluent without evidence of aphasia.Stuttering of words.  Able to follow 3 step commands without  difficulty. Cranial Nerves: II: Visual fields grossly normal,  III,IV, VI: ptosis not present, extra-ocular motions intact bilaterally, pupils equal, round, reactive to light and accommodation V,VII: smile symmetric, facial light touch sensation normal bilaterally VIII: hearing normal bilaterally IX,X: uvula rises symmetrically XI: bilateral shoulder shrug XII: midline tongue extension Motor: Right : Upper extremity   5/5    Left:     Upper extremity   5/5  Lower extremity   5/5     Lower extremity   5/5 Tone  and bulk:normal tone throughout; no atrophy noted Sensory: Pinprick and light touch intact throughout, bilaterally Deep Tendon Reflexes: 2+ and symmetric throughout Plantars: Right: downgoing   Left: downgoing Cerebellar: normal finger-to-nose, normal rapid alternating movements and normal heel-to-shin test Gait: normal gait and station     Lab Results: Basic Metabolic Panel: Recent Labs  Lab 01/22/17 1827 01/22/17 1833  NA 134* 139  K 3.7 3.8  CL 101 104  CO2 21*  --   GLUCOSE 91 92  BUN 22* 24*  CREATININE 1.50* 1.40*  CALCIUM 8.7*  --     CBC: Recent Labs  Lab 01/22/17 1827 01/22/17 1833  WBC 6.3  --   NEUTROABS 3.5  --   HGB 13.2 12.6  HCT 37.3 37.0  MCV 89.4  --   PLT 189  --     Coagulation Studies: Recent Labs    01/22/17 1827  LABPROT 23.1*  INR 2.07    Imaging: Dg Chest 2 View  Result Date: 01/22/2017 CLINICAL DATA:  Syncopal episode EXAM: CHEST  2 VIEW COMPARISON:  04/01/2016 FINDINGS: Cardiac shadow is stable. Defibrillator is again seen and stable. The lungs are well aerated bilaterally. No focal infiltrate or sizable effusion is seen. Degenerative changes of thoracic spine are noted. IMPRESSION: No active cardiopulmonary disease. Electronically Signed   By: Inez Catalina M.D.   On: 01/22/2017 19:29   Ct Head Code Stroke Wo Contrast`  Result Date: 01/22/2017 CLINICAL DATA:  Code stroke. 83 year old female with slurred speech. Last  seen normal 1750 hours. EXAM: CT HEAD WITHOUT CONTRAST TECHNIQUE: Contiguous axial images were obtained from the base of the skull through the vertex without intravenous contrast. COMPARISON:  CTA head and neck 05/27/2016 and earlier. FINDINGS: Brain: Stable cerebral volume. Patchy and confluent bilateral cerebral white matter hypodensity with some deep white matter capsule involvement appears stable. Stable gray-white matter differentiation elsewhere. No cortically based acute infarct identified. No acute intracranial hemorrhage identified. No midline shift, mass effect, or evidence of intracranial mass lesion. No ventriculomegaly. Vascular: Calcified atherosclerosis at the skull base. No suspicious intracranial vascular hyperdensity. Skull: No acute osseous abnormality identified. Sinuses/Orbits: Visualized paranasal sinuses and mastoids are stable and well pneumatized. Other: No acute orbit or scalp soft tissue findings. ASPECTS ([place] Stroke Program Early CT Score) - Ganglionic level infarction (caudate, lentiform nuclei, internal capsule, insula, M1-M3 cortex): 7 - Supraganglionic infarction (M4-M6 cortex): 3 Total score (0-10 with 10 being normal): 10 IMPRESSION: 1. Stable noncontrast CT appearance of the brain since April. No intracranial hemorrhage or acute cortically based infarct identified. 2. ASPECTS is 10. 3. These results were communicated to [name] at 01/22/2017 6:09 pmon 01/22/2017 by text page via the [facility] messaging system. Electronically Signed   By: Genevie Ann M.D.   On: 01/22/2017 18:15     ASSESSMENT AND PLAN   Syncope Stuttering speech  No focal deficits noted on exam apart from stuttering speech. Patient is able to answer all questions-just stutters before answering. Likely from anxiety. Stroke alert cancelled  as patient appears to have no neurological deficits. CT head unremarkable. Also not a candidate for TPA as patient is on Coumadinm improved symptoms and unlikely to be  TIA/stroke.     Vicente Weidler [facility] Pager Number [phone]

## 2017-01-22 NOTE — Progress Notes (Signed)
Remote ICD transmission.   

## 2017-01-22 NOTE — ED Provider Notes (Signed)
[facility] EMERGENCY DEPARTMENT Provider Note   CSN: [id number] Arrival date & time: 01/22/17  1815   An emergency department physician performed an initial assessment on this suspected stroke patient at [place].  History   Chief Complaint Chief Complaint  Patient presents with  . Loss of Consciousness    HPI Michele [name] is a 83 y.o. female.  HPI 83 year old female with a history of A. Fib on Coumadin, CHF with pacemaker/ICD, HLD, hypothyroidism presenting after a syncopal episode in a grocery store.  Per EMS she was standing in line and syncopized and caught by another person in line and eased to the ground.  She did not hit her head.  When EMS arrived at the states she was alert and oriented and complaining of lightheadedness.  In route EMS stated that she became a phasic and unable to answer questions appropriately.  On arrival she is unable to answer questions and appears to have expressive aphasia therefore code stroke called.  After the stroke workup patient was able to answer questions appropriately.  She states that she does not remember the event but she does remember feeling lightheaded while in line.  She denied chest pain or shortness of breath.  She does not member anything else until she was in the emergency department.  She states that she has had a episode similar to this in April where she had an unresponsive episode associated with difficulty speaking and was told that she had "some kind of stroke, but not the bad kind".  Currently denies headache, chest pain, shortness of breath, abdominal pain, numbness or weakness.  Past Medical History:  Diagnosis Date  . Atrial fibrillation ([place])    NEG STRESS CARDIOLITE  . C. difficile colitis   . Cardiomyopathy    RATE RELATED  . CHF (congestive heart failure) ([place])   . Diverticulitis    PMH of X 2  . Diverticulosis    DIVERTICULITIS  . Gilbert's syndrome   . Hx of colonic polyp 2011   Dr Fuller Plan  .  Hyperlipidemia    NMR LIOPROFILE LDL 234(3206/2234)HDL 37,TG86  . Renal insufficiency 12/09/2015  . Thyroid disease    HYPOTHYROIDISM...THYROID NODULES    Patient Active Problem List   Diagnosis Date Noted  . Syncope 01/22/2017  . Aphasia 01/22/2017  . Stereotyped movements 01/22/2017  . Anticoagulated 06/07/2016  . Stroke (cerebrum) ([place]) 05/27/2016  . Vertebrobasilar artery syndrome   . Complicated migraine   . Acute embolic stroke ([place]) 50/35/4656  . AKI (acute kidney injury) ([place]) 12/13/2015  . Urinary retention 12/13/2015  . CKD (chronic kidney disease) stage 3, GFR 30-59 ml/min ([facility]) 12/09/2015  . Preventative health care 07/13/2015  . Grief 07/13/2015  . Encounter for therapeutic drug monitoring 04/20/2013  . Cardiomyopathy, secondary ([place]) 03/20/2009  . CHEST PAIN -.Peri INCISIONAL 07/14/2008  . Automatic implantable cardioverter-defibrillator in situ 04/19/2008  . FATIGUE 08/31/2007  . Personal history of other diseases of digestive system 04/14/2007  . Hyperlipidemia 12/22/2006  . Anxiety state 11/27/2006  . KERATOCONJUNCTIVITIS SICCA 09/08/2006  . ATRIAL FIBRILLATION 07/14/2006  . Hypothyroidism, postsurgical 07/09/2006    Past Surgical History:  Procedure Laterality Date  . ABDOMINAL HYSTERECTOMY  1973  . APPENDECTOMY  1956  . BIOPSY THYROID     SINGLE NODULE  . BREAST BIOPSY  1997  . CARDIAC DEFIBRILLATOR PLACEMENT    . CHOLECYSTECTOMY  1972  . colonoscopy with polypectomy  2011  . EP IMPLANTABLE DEVICE N/A 11/21/2015   Procedure:  BIV ICD Generator Changeout;  Surgeon: Deboraha Sprang, MD;  Location: [facility];  Service: Cardiovascular;  Laterality: N/A;  . ICD....02/2008    . LEAD REVISION N/A 08/21/2011   Procedure: LEAD REVISION;  Surgeon: Deboraha Sprang, MD;  Location: [facility] CATH LAB;  Service: Cardiovascular;  Laterality: N/A;  . PACEMAKER PLACEMENT     10/2006  . ROTATOR CUFF REPAIR     x2  . THROIDECTOMY 12/08     benign    OB History     No data available       Home Medications    Prior to Admission medications   Medication Sig Start Date End Date Taking? Authorizing Provider  cholecalciferol (VITAMIN D) 1000 UNITS tablet Take 1,000 Units by mouth daily.   Yes [provider]  co-enzyme Q-10 30 MG capsule Take 30 mg by mouth daily.   Yes [provider]  furosemide (LASIX) 20 MG tablet TAKE 1 TABLET (20 MG TOTAL) BY MOUTH AS NEEDED FOR FLUID. 12/29/16  Yes Biagio Borg, MD  levothyroxine (SYNTHROID, LEVOTHROID) 50 MCG tablet Take 1 tablet (50 mcg total) by mouth daily. 09/03/16  Yes Biagio Borg, MD  warfarin (COUMADIN) 5 MG tablet TAKE AS DIRECTED. Patient taking differently: Take 5 mg by mouth at bedtime. TAKE AS DIRECTED. 09/08/16  Yes Biagio Borg, MD  Pitavastatin Calcium (LIVALO) 4 MG TABS 1 tab by mouth daily Patient not taking: Reported on 01/22/2017 10/21/16   Biagio Borg, MD  VYTORIN 10-40 MG tablet Take 1 tablet by mouth daily. Patient not taking: Reported on 01/22/2017 06/06/16   Biagio Borg, MD    Family History Family History  Problem Relation Age of Onset  . Heart attack Mother 56  . Breast cancer Sister   . Heart attack Brother 9  . Lung disease Father        Silicosis  . Vasculitis Sister        Giant Cell Arteritis  . Ulcerative colitis Son     Social History Social History   Tobacco Use  . Smoking status: Never Smoker  . Smokeless tobacco: Never Used  Substance Use Topics  . Alcohol use: No  . Drug use: No     Allergies   Cephalexin; Erythromycin; Terbinafine hcl; Colchicine; Crestor [rosuvastatin calcium]; Rosuvastatin; and Vytorin [ezetimibe-simvastatin]   Review of Systems Review of Systems  Constitutional: Negative for chills and fever.  HENT: Negative for ear pain and sore throat.   Eyes: Negative for pain and visual disturbance.  Respiratory: Negative for cough and shortness of breath.   Cardiovascular: Negative for chest pain and palpitations.    Gastrointestinal: Negative for abdominal pain and vomiting.  Genitourinary: Negative for dysuria and hematuria.  Musculoskeletal: Negative for arthralgias and back pain.  Skin: Negative for color change and rash.  Neurological: Positive for syncope and speech difficulty. Negative for seizures.  All other systems reviewed and are negative.    Physical Exam Updated Vital Signs BP 135/88   Pulse 72   Temp (!) 97.5 F (36.4 C) (Oral)   Resp (!) 25   Ht 5\' 3"  (1.6 m)   Wt 56.7 kg (125 lb)   SpO2 98%   BMI 22.14 kg/m   Physical Exam  Constitutional: She appears well-developed and well-nourished. No distress.  HENT:  Head: Normocephalic and atraumatic.  Eyes: Conjunctivae and EOM are normal. Pupils are equal, round, and reactive to light.  Neck: Normal range of motion. Neck  supple.  Cardiovascular: Normal rate and regular rhythm.  No murmur heard. Pulmonary/Chest: Effort normal and breath sounds normal. No respiratory distress.  Abdominal: Soft. There is no tenderness.  Musculoskeletal: She exhibits no edema.  Neurological: She is alert.  On initial exam, pt with expressive aphasia with word salad and slurred speech. No facial droop. RUE appeared intermittently contracted and making repetitive movement. Moving other extremities appropriately and on command with no obvious weakness. Unable to assess sensation as pt not answering questions appropriately.  Skin: Skin is warm and dry.  Psychiatric: She has a normal mood and affect.  Nursing note and vitals reviewed.    ED Treatments / Results  Labs (all labs ordered are listed, but only abnormal results are displayed) Labs Reviewed  PROTIME-INR - Abnormal; Notable for the following components:      Result Value   Prothrombin Time 23.1 (*)    All other components within normal limits  APTT - Abnormal; Notable for the following components:   aPTT 41 (*)    All other components within normal limits  COMPREHENSIVE METABOLIC  PANEL - Abnormal; Notable for the following components:   Sodium 134 (*)    CO2 21 (*)    BUN 22 (*)    Creatinine, Ser 1.50 (*)    Calcium 8.7 (*)    Total Bilirubin 1.9 (*)    GFR calc non Af Amer 31 (*)    GFR calc Af Amer 35 (*)    All other components within normal limits  URINALYSIS, ROUTINE W REFLEX MICROSCOPIC - Abnormal; Notable for the following components:   Ketones, ur 5 (*)    All other components within normal limits  I-STAT CHEM 8, ED - Abnormal; Notable for the following components:   BUN 24 (*)    Creatinine, Ser 1.40 (*)    Calcium, Ion 1.06 (*)    All other components within normal limits  ETHANOL  CBC  DIFFERENTIAL  RAPID URINE DRUG SCREEN, HOSP PERFORMED  HEMOGLOBIN A1C  LIPID PANEL  PROTIME-INR  I-STAT TROPONIN, ED    EKG  EKG Interpretation  Date/Time:  Thursday January 22 2017 18:33:27 EST Ventricular Rate:  70 PR Interval:    QRS Duration: 109 QT Interval:  462 QTC Calculation: 499 R Axis:   125 Text Interpretation:  Atrial-sensed ventricular-paced rhythm No significant change since last tracing Confirmed by Gareth Morgan [phone]) on 01/22/2017 6:53:27 PM       Radiology Dg Chest 2 View  Result Date: 01/22/2017 CLINICAL DATA:  Syncopal episode EXAM: CHEST  2 VIEW COMPARISON:  04/01/2016 FINDINGS: Cardiac shadow is stable. Defibrillator is again seen and stable. The lungs are well aerated bilaterally. No focal infiltrate or sizable effusion is seen. Degenerative changes of thoracic spine are noted. IMPRESSION: No active cardiopulmonary disease. Electronically Signed   By: Inez Catalina M.D.   On: 01/22/2017 19:29   Ct Head Code Stroke Wo Contrast`  Result Date: 01/22/2017 CLINICAL DATA:  Code stroke. 83 year old female with slurred speech. Last seen normal 1750 hours. EXAM: CT HEAD WITHOUT CONTRAST TECHNIQUE: Contiguous axial images were obtained from the base of the skull through the vertex without intravenous contrast. COMPARISON:  CTA head  and neck 05/27/2016 and earlier. FINDINGS: Brain: Stable cerebral volume. Patchy and confluent bilateral cerebral white matter hypodensity with some deep white matter capsule involvement appears stable. Stable gray-white matter differentiation elsewhere. No cortically based acute infarct identified. No acute intracranial hemorrhage identified. No midline shift, mass effect, or evidence of  intracranial mass lesion. No ventriculomegaly. Vascular: Calcified atherosclerosis at the skull base. No suspicious intracranial vascular hyperdensity. Skull: No acute osseous abnormality identified. Sinuses/Orbits: Visualized paranasal sinuses and mastoids are stable and well pneumatized. Other: No acute orbit or scalp soft tissue findings. ASPECTS ([place] Stroke Program Early CT Score) - Ganglionic level infarction (caudate, lentiform nuclei, internal capsule, insula, M1-M3 cortex): 7 - Supraganglionic infarction (M4-M6 cortex): 3 Total score (0-10 with 10 being normal): 10 IMPRESSION: 1. Stable noncontrast CT appearance of the brain since April. No intracranial hemorrhage or acute cortically based infarct identified. 2. ASPECTS is 10. 3. These results were communicated to [name] at 01/22/2017 6:09 pmon 01/22/2017 by text Lashon Beringer via the [facility] messaging system. Electronically Signed   By: Genevie Ann M.D.   On: 01/22/2017 18:15    Procedures Procedures (including critical care time)  Medications Ordered in ED Medications  cholecalciferol (VITAMIN D) tablet 1,000 Units (not administered)  levothyroxine (SYNTHROID, LEVOTHROID) tablet 50 mcg (not administered)  ezetimibe-simvastatin (VYTORIN) 10-40 MG per tablet 1 tablet (not administered)   stroke: mapping our early stages of recovery book (not administered)  acetaminophen (TYLENOL) tablet 650 mg (not administered)    Or  acetaminophen (TYLENOL) solution 650 mg (not administered)    Or  acetaminophen (TYLENOL) suppository 650 mg (not administered)  senna-docusate  (Senokot-S) tablet 1 tablet (not administered)  0.9 %  sodium chloride infusion (not administered)  iopamidol (ISOVUE-370) 76 % injection (not administered)  warfarin (COUMADIN) tablet 5 mg (not administered)  Warfarin - Pharmacist Dosing Inpatient (not administered)     Initial Impression / Assessment and Plan / ED Course  I have reviewed the triage vital signs and the nursing notes.  Pertinent labs & imaging results that were available during my care of the patient were reviewed by me and considered in my medical decision making (see chart for details).     83 year old female presenting after syncopal episode who then became a phasic with repetitive motions of the right upper extremity and intermittent contraction of the right upper extremity.  No obvious seizure-like activity.  Due to her aphasia, code stroke called.  Last known normal 20 minutes prior to arrival.  By the time neurology assessed the patient, he states that she was stuttering but answering questions appropriately.  CT head negative for acute ICH or obvious ischemic injury.  Stroke labs ordered. EKG with a ventricularly paced rhythm with no obvious ischemic changes per scarbossa criteria. Chest x-ray unremarkable with no cardiomegaly, wide mediastinum, pneumothorax or signs of pneumonia.  After the stroke workup, the patient is now alert and oriented x4 and answering questions appropriately with no stuttering or signs of aphasia.  Normal neuro exam at this time.  Labs grossly unremarkable with a creatinine only mildly above her baseline, negative troponin.  She is now back to her baseline, concern for TIA.  Patient discussed with Dr. Cheral Marker who is now on for neurology and as she has a similar episode in April and there is concern for possible focal seizure versus TIA, she will be admitted to the hospitalist for EEG and CTA.  Patient discussed with hospitalist who is amenable with this plan.  Patient is amenable with this plan.   She remained hemodynamically stable in the emergency department.  Final Clinical Impressions(s) / ED Diagnoses   Final diagnoses:  None    ED Discharge Orders    None       Jniyah Dantuono Mal[place], MD 01/22/17 2314    Gareth Morgan,  MD 01/26/17 1913

## 2017-01-22 NOTE — ED Notes (Signed)
Pt back in room.

## 2017-01-22 NOTE — ED Notes (Signed)
Patient transported to X-ray 

## 2017-01-22 NOTE — Progress Notes (Signed)
Given similar episode in April, with word-salad suggestive of receptive aphasia and what seems like a similar stereotyped episode today, seizure is on the DDx. She had some posturing of right hand but no jerking. Also on DDx is recurrent TIA. Unable to obtain MRI brain. Agree with ED attending that admission for EEG, CTA head and neck is indicated. Had TTE last admit but no TEE, therefore should also obtain TEE this visit.   Electronically signed: Dr. Kerney Elbe

## 2017-01-22 NOTE — ED Triage Notes (Signed)
Pt arrived EMS where she had a syncopal episode at [employer]. Pt was caught by a person in line so she did not fall. Upon arrival to the ED pt began to have what appeared to be aphasia. Code stroke activated by Dr. Billy Fischer.

## 2017-01-22 NOTE — ED Notes (Signed)
Floor contacted to give report, no RN able to take report at this time.  Will give bedside report

## 2017-01-22 NOTE — H&P (Signed)
History and Physical    Michele [name] FMB:[id number] DOB: 1929/03/04 DOA: 01/22/2017  PCP: Biagio Borg, MD   Patient coming from: Home  Chief Complaint: Syncope   HPI: Michele [name] is a 83 y.o. female with medical history significant for atrial fibrillation on warfarin, hyperlipidemia, Gilbert syndrome, chronic kidney disease stage III, and hypothyroidism, now presenting to the emergency department after syncopal episode at a grocery store.  Patient reports that she was in her usual state of health and having an uneventful day when she was standing in line at the grocery store, noted some chills and lightheadedness, had an apparent syncopal episode, caught by a fellow shopper before she fell.  She was noted by EMS to have "word salad" and was transported to the ED.  ED Course: Upon arrival to the ED, patient is found to be afebrile, saturating well on room air, blood pressure 145/113, and vitals otherwise stable.  EKG features a paced rhythm and chest x-ray is negative for acute cardiopulmonary disease.  Noncontrast head CT is negative for acute intracranial abnormality.  Chemistry panel is notable for serum creatinine 1.50, up from an apparent baseline of 1.2.  INR is therapeutic at 2.07, troponin is negative, and CBC is unremarkable.  Patient was noted to have speech difficulty and repetitive right arm movements in the ED and neurology was consulted by the ED physician.  Medical admission was recommended for evaluation of possible CVA/TIA or seizure.  Review of Systems:  All other systems reviewed and apart from HPI, are negative.  Past Medical History:  Diagnosis Date  . Atrial fibrillation ([place])    NEG STRESS CARDIOLITE  . C. difficile colitis   . Cardiomyopathy    RATE RELATED  . CHF (congestive heart failure) ([place])   . Diverticulitis    PMH of X 2  . Diverticulosis    DIVERTICULITIS  . Gilbert's syndrome   . Hx of colonic polyp 2011   Dr Fuller Plan  . Hyperlipidemia    NMR  LIOPROFILE LDL 234(3206/2234)HDL 37,TG86  . Renal insufficiency 12/09/2015  . Thyroid disease    HYPOTHYROIDISM...THYROID NODULES    Past Surgical History:  Procedure Laterality Date  . ABDOMINAL HYSTERECTOMY  1973  . APPENDECTOMY  1956  . BIOPSY THYROID     SINGLE NODULE  . BREAST BIOPSY  1997  . CARDIAC DEFIBRILLATOR PLACEMENT    . CHOLECYSTECTOMY  1972  . colonoscopy with polypectomy  2011  . EP IMPLANTABLE DEVICE N/A 11/21/2015   Procedure: BIV ICD Generator Changeout;  Surgeon: Deboraha Sprang, MD;  Location: [facility];  Service: Cardiovascular;  Laterality: N/A;  . ICD....02/2008    . LEAD REVISION N/A 08/21/2011   Procedure: LEAD REVISION;  Surgeon: Deboraha Sprang, MD;  Location: [facility] CATH [facility];  Service: Cardiovascular;  Laterality: N/A;  . PACEMAKER PLACEMENT     10/2006  . ROTATOR CUFF REPAIR     x2  . THROIDECTOMY 12/08     benign     reports that  has never smoked. she has never used smokeless tobacco. She reports that she does not drink alcohol or use drugs.  Allergies  Allergen Reactions  . Cephalexin Hives  . Erythromycin Swelling and Other (See Comments)    REACTION: swollen and sore mouth  . Terbinafine Hcl     Rash   . Colchicine Other (See Comments)    REACTION: violent diarrhea  . Crestor [Rosuvastatin Calcium] Other (See Comments)    Arm pain  .  Rosuvastatin Other (See Comments)    REACTION: upper arm pain bilaterally  . Vytorin [Ezetimibe-Simvastatin] Other (See Comments)    Nightmares with generic    Family History  Problem Relation Age of Onset  . Heart attack Mother 55  . Breast cancer Sister   . Heart attack Brother 20  . Lung disease Father        Silicosis  . Vasculitis Sister        Giant Cell Arteritis  . Ulcerative colitis Son      Prior to Admission medications   Medication Sig Start Date End Date Taking? Authorizing Provider  cholecalciferol (VITAMIN D) 1000 UNITS tablet Take 1,000 Units by mouth daily.    [provider]  co-enzyme Q-10 30 MG capsule Take 30 mg by mouth daily.    [provider]  furosemide (LASIX) 20 MG tablet TAKE 1 TABLET (20 MG TOTAL) BY MOUTH AS NEEDED FOR FLUID. 12/29/16   Biagio Borg, MD  levothyroxine (SYNTHROID, LEVOTHROID) 50 MCG tablet Take 1 tablet (50 mcg total) by mouth daily. 09/03/16   Biagio Borg, MD  Pitavastatin Calcium (LIVALO) 4 MG TABS 1 tab by mouth daily 10/21/16   Biagio Borg, MD  VYTORIN 10-40 MG tablet Take 1 tablet by mouth daily. 06/06/16   Biagio Borg, MD  warfarin (COUMADIN) 5 MG tablet TAKE AS DIRECTED. 09/08/16   Biagio Borg, MD    Physical Exam: Vitals:   01/22/17 1800 01/22/17 1820 01/22/17 1821 01/22/17 2200  BP:   (!) 145/113 121/81  Pulse:   73 81  Resp:   (!) 23 11  Temp:   (!) 97.5 F (36.4 C)   TempSrc:   Oral   SpO2:   100% 97%  Weight: 56.8 kg (125 lb 3.5 oz) 56.7 kg (125 lb)    Height:  5\' 3"  (1.6 m)        Constitutional: NAD, calm, comfortable Eyes: PERTLA, lids and conjunctivae normal ENMT: Mucous membranes are moist. Posterior pharynx clear of any exudate or lesions.   Neck: normal, supple, no masses, no thyromegaly Respiratory: clear to auscultation bilaterally, no wheezing, no crackles. Normal respiratory effort.  Cardiovascular: S1 & S2 heard, regular rate and rhythm. No extremity edema. No significant JVD. Abdomen: No distension, no tenderness, no masses palpated. Bowel sounds normal.  Musculoskeletal: no clubbing / cyanosis. No joint deformity upper and lower extremities.   Skin: no significant rashes, lesions, ulcers. Warm, dry, well-perfused. Neurologic: CN 2-12 grossly intact. Sensation intact, patellar DTR normal. Strength 5/5 in all 4 limbs.  Psychiatric: Alert and oriented x 3. Pleasant, cooperative.     Labs on Admission: I have personally reviewed following labs and imaging studies  CBC: Recent Labs  Lab 01/22/17 1827 01/22/17 1833  WBC 6.3  --   NEUTROABS 3.5  --   HGB 13.2 12.6    HCT 37.3 37.0  MCV 89.4  --   PLT 189  --    Basic Metabolic Panel: Recent Labs  Lab 01/22/17 1827 01/22/17 1833  NA 134* 139  K 3.7 3.8  CL 101 104  CO2 21*  --   GLUCOSE 91 92  BUN 22* 24*  CREATININE 1.50* 1.40*  CALCIUM 8.7*  --    GFR: Estimated Creatinine Clearance: 24.3 mL/min (A) (by C-G formula based on SCr of 1.4 mg/dL (H)). Liver Function Tests: Recent Labs  Lab 01/22/17 1827  AST 27  ALT 17  ALKPHOS 54  BILITOT 1.9*  PROT 6.7  ALBUMIN 3.9   No results for input(s): LIPASE, AMYLASE in the last 168 hours. No results for input(s): AMMONIA in the last 168 hours. Coagulation Profile: Recent Labs  Lab 01/22/17 1827  INR 2.07   Cardiac Enzymes: No results for input(s): CKTOTAL, CKMB, CKMBINDEX, TROPONINI in the last 168 hours. BNP (last 3 results) No results for input(s): PROBNP in the last 8760 hours. HbA1C: No results for input(s): HGBA1C in the last 72 hours. CBG: No results for input(s): GLUCAP in the last 168 hours. Lipid Profile: No results for input(s): CHOL, HDL, LDLCALC, TRIG, CHOLHDL, LDLDIRECT in the last 72 hours. Thyroid Function Tests: No results for input(s): TSH, T4TOTAL, FREET4, T3FREE, THYROIDAB in the last 72 hours. Anemia Panel: No results for input(s): VITAMINB12, FOLATE, FERRITIN, TIBC, IRON, RETICCTPCT in the last 72 hours. Urine analysis:    Component Value Date/Time   COLORURINE YELLOW 09/01/2016 1524   APPEARANCEUR CLEAR 09/01/2016 1524   LABSPEC 1.015 09/01/2016 1524   PHURINE 6.0 09/01/2016 1524   GLUCOSEU NEGATIVE 09/01/2016 1524   HGBUR NEGATIVE 09/01/2016 1524   BILIRUBINUR NEGATIVE 09/01/2016 1524   BILIRUBINUR negative 09/16/2013 1358   KETONESUR NEGATIVE 09/01/2016 1524   PROTEINUR 30+ 09/16/2013 1358   PROTEINUR NEGATIVE 06/01/2013 0633   UROBILINOGEN 0.2 09/01/2016 1524   NITRITE NEGATIVE 09/01/2016 1524   LEUKOCYTESUR NEGATIVE 09/01/2016 1524   Sepsis  Labs: @LABRCNTIP (procalcitonin:4,lacticidven:4) )No results found for this or any previous visit (from the past 240 hour(s)).   Radiological Exams on Admission: Dg Chest 2 View  Result Date: 01/22/2017 CLINICAL DATA:  Syncopal episode EXAM: CHEST  2 VIEW COMPARISON:  04/01/2016 FINDINGS: Cardiac shadow is stable. Defibrillator is again seen and stable. The lungs are well aerated bilaterally. No focal infiltrate or sizable effusion is seen. Degenerative changes of thoracic spine are noted. IMPRESSION: No active cardiopulmonary disease. Electronically Signed   By: Inez Catalina M.D.   On: 01/22/2017 19:29   Ct Head Code Stroke Wo Contrast`  Result Date: 01/22/2017 CLINICAL DATA:  Code stroke. 83 year old female with slurred speech. Last seen normal 1750 hours. EXAM: CT HEAD WITHOUT CONTRAST TECHNIQUE: Contiguous axial images were obtained from the base of the skull through the vertex without intravenous contrast. COMPARISON:  CTA head and neck 05/27/2016 and earlier. FINDINGS: Brain: Stable cerebral volume. Patchy and confluent bilateral cerebral white matter hypodensity with some deep white matter capsule involvement appears stable. Stable gray-white matter differentiation elsewhere. No cortically based acute infarct identified. No acute intracranial hemorrhage identified. No midline shift, mass effect, or evidence of intracranial mass lesion. No ventriculomegaly. Vascular: Calcified atherosclerosis at the skull base. No suspicious intracranial vascular hyperdensity. Skull: No acute osseous abnormality identified. Sinuses/Orbits: Visualized paranasal sinuses and mastoids are stable and well pneumatized. Other: No acute orbit or scalp soft tissue findings. ASPECTS ([place] Stroke Program Early CT Score) - Ganglionic level infarction (caudate, lentiform nuclei, internal capsule, insula, M1-M3 cortex): 7 - Supraganglionic infarction (M4-M6 cortex): 3 Total score (0-10 with 10 being normal): 10 IMPRESSION: 1.  Stable noncontrast CT appearance of the brain since April. No intracranial hemorrhage or acute cortically based infarct identified. 2. ASPECTS is 10. 3. These results were communicated to [name] at 01/22/2017 6:09 pmon 01/22/2017 by text page via the [facility] messaging system. Electronically Signed   By: Genevie Ann M.D.   On: 01/22/2017 18:15    EKG: Independently reviewed. Atrial-sensed, vetricular-paced rhythm.   Assessment/Plan  1. Syncope  - Pt presents after a syncopal episode preceded immediately  by chills and lightheadedness, caught by bystander on her way down  - Plan to continue cardiac monitoring, interrogate pacer, check orthostatic vitals, obtain TTE, start IVF hydration in setting of apparent hypovolemia   2. Aphasia; repetitive RUE movements  - Returned to baseline while still in ED  - Neurology is consulting and much appreciated - Head CT negative for acute intracranial abnormality  - MRI precluded by pacer  - Plan to continue cardiac monitoring, neuro checks, obtain CTA head & neck, EEG, and TTE; neurology also recommending TEE   3. Atrial fibrillation  - In a paced-rhythm on admission  - CHADS-VASc 4 (age x2, gender, HLD) - Continue warfarin    4. CKD stage III  - SCr is 1.50 on admission, priors in 1.2-range  - Providing gentle IVF hydration overnight and will repeat chem panel in am   5. Hypothyroidism  - TSH was modestly elevated in September with normal T4, followed by PCP  - Continue Synthroid    DVT prophylaxis: warfarin  Code Status: Full  Family Communication: Discussed with patient Disposition Plan: Observe on telemetry Consults called: Neurology Admission status: Observation    Vianne Bulls, MD [facility] Pager [phone]  If 7PM-7AM, please contact night-coverage [email or web address] Password [email or web address]  01/22/2017, 10:08 PM

## 2017-01-22 NOTE — Progress Notes (Signed)
ANTICOAGULATION CONSULT NOTE - Initial Consult  Pharmacy Consult for warfarin Indication: atrial fibrillation  Allergies  Allergen Reactions  . Cephalexin Hives  . Erythromycin Swelling and Other (See Comments)    REACTION: swollen and sore mouth  . Terbinafine Hcl     Rash   . Colchicine Other (See Comments)    REACTION: violent diarrhea  . Crestor [Rosuvastatin Calcium] Other (See Comments)    Arm pain  . Rosuvastatin Other (See Comments)    REACTION: upper arm pain bilaterally  . Vytorin [Ezetimibe-Simvastatin] Other (See Comments)    Nightmares with generic    Patient Measurements: Height: 5\' 3"  (160 cm) Weight: 125 lb (56.7 kg) IBW/kg (Calculated) : 52.4  Vital Signs: Temp: 97.5 F (36.4 C) (12/06 1821) Temp Source: Oral (12/06 1821) BP: 121/81 (12/06 2200) Pulse Rate: 81 (12/06 2200)  Labs: Recent Labs    01/22/17 1827 01/22/17 1833  HGB 13.2 12.6  HCT 37.3 37.0  PLT 189  --   APTT 41*  --   LABPROT 23.1*  --   INR 2.07  --   CREATININE 1.50* 1.40*    Estimated Creatinine Clearance: 24.3 mL/min (A) (by C-G formula based on SCr of 1.4 mg/dL (H)).   Medical History: Past Medical History:  Diagnosis Date  . Atrial fibrillation ([place])    NEG STRESS CARDIOLITE  . C. difficile colitis   . Cardiomyopathy    RATE RELATED  . CHF (congestive heart failure) ([place])   . Diverticulitis    PMH of X 2  . Diverticulosis    DIVERTICULITIS  . Gilbert's syndrome   . Hx of colonic polyp 2011   Dr Fuller Plan  . Hyperlipidemia    NMR LIOPROFILE LDL 234(3206/2234)HDL 37,TG86  . Renal insufficiency 12/09/2015  . Thyroid disease    HYPOTHYROIDISM...THYROID NODULES    Medications:   (Not in a hospital admission)  Assessment: 87 YOF on warfarin at home for Afib. Pharmacy consulted to resume anticoagulation. H/H and Plt wnl. INR on presentation is therapeutic at 2.07. Last dose of warfarin was last midnight.   Home Coumadin dose: 5 mg daily   Goal of Therapy:   INR 2-3 Monitor platelets by anticoagulation protocol: Yes   Plan:  -Resume home warfarin dose of 5 mg daily -Daily PT/INR  Albertina Parr, PharmD., BCPS Clinical Pharmacist Pager [phone]

## 2017-01-22 NOTE — ED Notes (Signed)
Patient placed on bedpan for urine sample.   

## 2017-01-23 ENCOUNTER — Ambulatory Visit: Payer: Medicare Other

## 2017-01-23 ENCOUNTER — Observation Stay (HOSPITAL_COMMUNITY): Payer: Medicare Other

## 2017-01-23 ENCOUNTER — Observation Stay (HOSPITAL_BASED_OUTPATIENT_CLINIC_OR_DEPARTMENT_OTHER): Payer: Medicare Other

## 2017-01-23 ENCOUNTER — Other Ambulatory Visit: Payer: Self-pay

## 2017-01-23 DIAGNOSIS — E785 Hyperlipidemia, unspecified: Secondary | ICD-10-CM | POA: Diagnosis not present

## 2017-01-23 DIAGNOSIS — I340 Nonrheumatic mitral (valve) insufficiency: Secondary | ICD-10-CM

## 2017-01-23 DIAGNOSIS — R4701 Aphasia: Secondary | ICD-10-CM | POA: Diagnosis not present

## 2017-01-23 DIAGNOSIS — Z95810 Presence of automatic (implantable) cardiac defibrillator: Secondary | ICD-10-CM | POA: Diagnosis not present

## 2017-01-23 DIAGNOSIS — F984 Stereotyped movement disorders: Secondary | ICD-10-CM | POA: Diagnosis not present

## 2017-01-23 DIAGNOSIS — R4781 Slurred speech: Secondary | ICD-10-CM | POA: Diagnosis not present

## 2017-01-23 DIAGNOSIS — I482 Chronic atrial fibrillation: Secondary | ICD-10-CM | POA: Diagnosis not present

## 2017-01-23 DIAGNOSIS — E890 Postprocedural hypothyroidism: Secondary | ICD-10-CM | POA: Diagnosis not present

## 2017-01-23 DIAGNOSIS — N183 Chronic kidney disease, stage 3 (moderate): Secondary | ICD-10-CM | POA: Diagnosis not present

## 2017-01-23 DIAGNOSIS — E876 Hypokalemia: Secondary | ICD-10-CM

## 2017-01-23 DIAGNOSIS — I351 Nonrheumatic aortic (valve) insufficiency: Secondary | ICD-10-CM

## 2017-01-23 DIAGNOSIS — R55 Syncope and collapse: Secondary | ICD-10-CM | POA: Diagnosis not present

## 2017-01-23 DIAGNOSIS — I6523 Occlusion and stenosis of bilateral carotid arteries: Secondary | ICD-10-CM | POA: Diagnosis not present

## 2017-01-23 LAB — COMPREHENSIVE METABOLIC PANEL
ALT: 16 U/L (ref 14–54)
AST: 29 U/L (ref 15–41)
Albumin: 3.4 g/dL — ABNORMAL LOW (ref 3.5–5.0)
Alkaline Phosphatase: 50 U/L (ref 38–126)
Anion gap: 9 (ref 5–15)
BUN: 16 mg/dL (ref 6–20)
CHLORIDE: 104 mmol/L (ref 101–111)
CO2: 23 mmol/L (ref 22–32)
Calcium: 8.5 mg/dL — ABNORMAL LOW (ref 8.9–10.3)
Creatinine, Ser: 1.22 mg/dL — ABNORMAL HIGH (ref 0.44–1.00)
GFR calc Af Amer: 45 mL/min — ABNORMAL LOW (ref >60)
GFR, EST NON AFRICAN AMERICAN: 39 mL/min — AB (ref >60)
Glucose, Bld: 156 mg/dL — ABNORMAL HIGH (ref 65–99)
POTASSIUM: 3.2 mmol/L — AB (ref 3.5–5.1)
SODIUM: 136 mmol/L (ref 135–145)
Total Bilirubin: 2 mg/dL — ABNORMAL HIGH (ref 0.3–1.2)
Total Protein: 6 g/dL — ABNORMAL LOW (ref 6.5–8.1)

## 2017-01-23 LAB — CBC WITH DIFFERENTIAL/PLATELET
BASOS ABS: 0 10*3/uL (ref 0.0–0.1)
BASOS PCT: 0 %
EOS ABS: 0.1 10*3/uL (ref 0.0–0.7)
EOS PCT: 2 %
HCT: 35.4 % — ABNORMAL LOW (ref 36.0–46.0)
Hemoglobin: 12.2 g/dL (ref 12.0–15.0)
LYMPHS PCT: 37 %
Lymphs Abs: 2.2 10*3/uL (ref 0.7–4.0)
MCH: 31.1 pg (ref 26.0–34.0)
MCHC: 34.5 g/dL (ref 30.0–36.0)
MCV: 90.3 fL (ref 78.0–100.0)
MONO ABS: 0.5 10*3/uL (ref 0.1–1.0)
Monocytes Relative: 8 %
Neutro Abs: 3.2 10*3/uL (ref 1.7–7.7)
Neutrophils Relative %: 53 %
PLATELETS: 165 10*3/uL (ref 150–400)
RBC: 3.92 MIL/uL (ref 3.87–5.11)
RDW: 13.8 % (ref 11.5–15.5)
WBC: 6 10*3/uL (ref 4.0–10.5)

## 2017-01-23 LAB — LIPID PANEL
CHOL/HDL RATIO: 5.4 ratio
CHOLESTEROL: 242 mg/dL — AB (ref 0–200)
HDL: 45 mg/dL (ref >40)
LDL CALC: 184 mg/dL — AB (ref 0–99)
Triglycerides: 63 mg/dL (ref <150)
VLDL: 13 mg/dL (ref 0–40)

## 2017-01-23 LAB — ECHOCARDIOGRAM COMPLETE
HEIGHTINCHES: 63 in
Weight: 2000 oz

## 2017-01-23 LAB — GLUCOSE, CAPILLARY: Glucose-Capillary: 81 mg/dL (ref 65–99)

## 2017-01-23 LAB — MAGNESIUM: MAGNESIUM: 1.9 mg/dL (ref 1.7–2.4)

## 2017-01-23 LAB — PROTIME-INR
INR: 2.08
PROTHROMBIN TIME: 23.2 s — AB (ref 11.4–15.2)

## 2017-01-23 LAB — HEMOGLOBIN A1C
Hgb A1c MFr Bld: 5.1 % (ref 4.8–5.6)
Mean Plasma Glucose: 99.67 mg/dL

## 2017-01-23 LAB — PHOSPHORUS: PHOSPHORUS: 2.3 mg/dL — AB (ref 2.5–4.6)

## 2017-01-23 MED ORDER — POTASSIUM PHOSPHATE MONOBASIC 500 MG PO TABS
500.0000 mg | ORAL_TABLET | Freq: Two times a day (BID) | ORAL | Status: DC
  Filled 2017-01-23: qty 1

## 2017-01-23 MED ORDER — K PHOS MONO-SOD PHOS DI & MONO 155-852-130 MG PO TABS
500.0000 mg | ORAL_TABLET | Freq: Two times a day (BID) | ORAL | Status: DC
  Administered 2017-01-23 – 2017-01-24 (×3): 500 mg via ORAL
  Filled 2017-01-23 (×3): qty 2

## 2017-01-23 MED ORDER — POTASSIUM CHLORIDE CRYS ER 20 MEQ PO TBCR: 40.0000 meq | EXTENDED_RELEASE_TABLET | Freq: Two times a day (BID) | ORAL | Status: DC

## 2017-01-23 MED ORDER — POTASSIUM CHLORIDE CRYS ER 20 MEQ PO TBCR
40.0000 meq | EXTENDED_RELEASE_TABLET | Freq: Once | ORAL | Status: AC
  Administered 2017-01-23: 40 meq via ORAL
  Filled 2017-01-23: qty 2

## 2017-01-23 NOTE — Evaluation (Addendum)
Physical Therapy Evaluation Patient Details Name: Michele [name] MRN: [id number] DOB: [birth year] Today's Date: 01/23/2017   History of Present Illness   83 y.o. female with medical history significant for atrial fibrillation on warfarin, hyperlipidemia, Gilbert syndrome, chronic kidney disease stage III, and hypothyroidism, now presenting to the emergency department after syncopal episode at a grocery store.  Patient reports that she was in her usual state of health and having an uneventful day when she was standing in line at the grocery store, noted some chills and lightheadedness, had an apparent syncopal episode, caught by a fellow shopper before she fell.  She was noted by EMS to have "word salad" and was transported to the ED.  Head CT is negative for acute findings.  MRI is pending.  Clinical Impression  Pt admitted with above diagnosis. Pt currently with functional limitations due to the deficits listed below (see PT Problem List). Pt with significant dizziness and positive for BPPV on right as well as right hypofunction.  Pt treated for BPPV and initiate treatment for hypofunction.  Pt very unsteady and if PT had not been guarding her, she would have fallen as she has poor balance reactions and was reaching for furniture and staggering.  Even with RW could not accept challenges to balance.  Pt also has a flight of steps to the bedroom and bathroom.    Feel that a SNF stay with therapy would benefit pt and allow her to go back home alone.  Pt agrees.  Will follow acutely.   Pt will benefit from skilled PT to increase their independence and safety with mobility to allow discharge to the venue listed below.      Follow Up Recommendations SNF;Supervision/Assistance - 24 hour - needs vestibular rehab    Equipment Recommendations  Rolling walker with 5" wheels    Recommendations for Other Services       Precautions / Restrictions Precautions Precautions: Fall Restrictions Weight Bearing  Restrictions: No      Mobility  Bed Mobility Overal bed mobility: Needs Assistance Bed Mobility: Supine to Sit     Supine to sit: Min guard     General bed mobility comments: Dizzy upon sitting up therefore needing steadying assist  Transfers Overall transfer level: Needs assistance Equipment used: 1 person hand held assist;Rolling walker (2 wheeled) Transfers: Sit to/from Stand Sit to Stand: Min assist;Mod assist         General transfer comment: Min A for correcting balance. Pt requiring three attempts for standing.  Has posterior lean and lacks balance reactions with no safety awareness of when losing balanc.e   Ambulation/Gait Ambulation/Gait assistance: Mod assist;Min assist Ambulation Distance (Feet): 150 Feet(25 feet without device and 150 feet with device) Assistive device: 1 person hand held assist;Rolling walker (2 wheeled) Gait Pattern/deviations: Step-through pattern;Decreased stride length;Decreased step length - right;Decreased step length - left;Leaning posteriorly;Staggering left;Staggering right;Drifts right/left;Ataxic   Gait velocity interpretation: Below normal speed for age/gender General Gait Details: Pt was able to ambulate but needed a lot of asssist due to poor balance with pt losing balance multiple times in all directions with no attempts to correct other than reaching for furniture.  Did better when PT cued pt for using "targets" for balance but pt does not have carryover and remember cuing well so needs constant cues.  Pt did better when walking with RW and did not lose balance with it but needed some cues for safety with transitions with Rw.  and needed cues for hand placement.  Stairs            Wheelchair Mobility    Modified Rankin (Stroke Patients Only) Modified Rankin (Stroke Patients Only) Pre-Morbid Rankin Score: Slight disability Modified Rankin: Moderately severe disability     Balance Overall balance assessment: Needs  assistance Sitting-balance support: No upper extremity supported;Feet supported Sitting balance-Leahy Scale: Poor Sitting balance - Comments: Needs assist with sitting if dizziness occurs.   Standing balance support: Single extremity supported;No upper extremity supported;During functional activity Standing balance-Leahy Scale: Poor Standing balance comment: Pt unable to maintain static standing balace. Three LOB during dynamic balance                             Pertinent Vitals/Pain Pain Assessment: No/denies pain    Home Living Family/patient expects to be discharged to:: Private residence Living Arrangements: Alone Available Help at Discharge: Family;Other (Comment);Available PRN/intermittently(daughter and son live in [place]) Type of Home: House Home Access: Stairs to enter Entrance Stairs-Rails: Chemical engineer of Steps: 4 at one entrance, 2 at another Home Layout: Two level;Bed/bath upstairs Home Equipment: None      Prior Function Level of Independence: Independent         Comments: ADLs, light IADLs, and driving     Hand Dominance   Dominant Hand: Right    Extremity/Trunk Assessment   Upper Extremity Assessment Upper Extremity Assessment: Defer to OT evaluation    Lower Extremity Assessment Lower Extremity Assessment: Generalized weakness    Cervical / Trunk Assessment Cervical / Trunk Assessment: Normal  Communication   Communication: No difficulties  Cognition Arousal/Alertness: Awake/alert Behavior During Therapy: WFL for tasks assessed/performed Overall Cognitive Status: Within Functional Limits for tasks assessed Area of Impairment: Memory                     Memory: Decreased short-term memory         General Comments: Does not recall some info per daughter, slow procession      General Comments General comments (skin integrity, edema, etc.): Pt tested positive for right BPPV.  Treated with  canalith repositioning maneuver.  Pt reports sheis less dizzy after treatment.  Also pt with positive head thrust right with right hypofunction.  Discussed the x1 exercises with pt and daughter and the need for further therapy.      Exercises Other Exercises Other Exercises: Laruth Bouchard daroff exercise and x1 exercises.     Assessment/Plan    PT Assessment Patient needs continued PT services  PT Problem List Decreased activity tolerance;Decreased balance;Decreased mobility;Decreased coordination;Decreased knowledge of use of DME;Decreased safety awareness;Decreased knowledge of precautions;Other (comment)(dizziness, BPPV, right hypofunction)       PT Treatment Interventions DME instruction;Gait training;Functional mobility training;Therapeutic activities;Therapeutic exercise;Balance training;Patient/family education;Stair training    PT Goals (Current goals can be found in the Care Plan section)  Acute Rehab PT Goals Patient Stated Goal:  go home after SNF PT Goal Formulation: With patient Time For Goal Achievement: 02/06/17 Potential to Achieve Goals: Good    Frequency Min 3X/week   Barriers to discharge Decreased caregiver support      Co-evaluation               AM-PAC PT "6 Clicks" Daily Activity  Outcome Measure Difficulty turning over in bed (including adjusting bedclothes, sheets and blankets)?: Unable Difficulty moving from lying on back to sitting on the side of the bed? : Unable Difficulty sitting down on and standing up  from a chair with arms (e.g., wheelchair, bedside commode, etc,.)?: Unable Help needed moving to and from a bed to chair (including a wheelchair)?: Total Help needed walking in hospital room?: A Lot Help needed climbing 3-5 steps with a railing? : Total 6 Click Score: 7    End of Session Equipment Utilized During Treatment: Gait belt Activity Tolerance: Patient limited by fatigue(limited by dizziness) Patient left: in bed;with call bell/phone  within reach;with bed alarm set;with family/visitor present Nurse Communication: Mobility status PT Visit Diagnosis: Other abnormalities of gait and mobility (R26.89);BPPV;Dizziness and giddiness (R42);Repeated falls (R29.6);History of falling (Z91.81) BPPV - Right/Left : Right    Time: 5790-3833 PT Time Calculation (min) (ACUTE ONLY): 54 min   Charges:   PT Evaluation $PT Eval Moderate Complexity: 1 Mod PT Treatments $Gait Training: 8-22 mins $Therapeutic Exercise: 8-22 mins $Canalith Rep Proc: 8-22 mins   PT G Codes:   PT G-Codes **NOT FOR INPATIENT CLASS** Functional Assessment Tool Used: AM-PAC 6 Clicks Basic Mobility Functional Limitation: Mobility: Walking and moving around Mobility: Walking and Moving Around Current Status (X8329): At least 40 percent but less than 60 percent impaired, limited or restricted Mobility: Walking and Moving Around Goal Status [phone]): At least 1 percent but less than 20 percent impaired, limited or restricted    [place] [phone] (pager)   Denice Paradise 01/23/2017, 4:17 PM

## 2017-01-23 NOTE — Procedures (Signed)
ELECTROENCEPHALOGRAM REPORT  Date of Study: 01/23/2017  Patient's Name: Michele [name] MRN: [id number] Date of Birth: [birth year]  Referring Provider: Dr. Kerney Elbe  Clinical History: This is an 83 year old woman with syncope, confusion.  Medications: Furosemide Synthroid Vytorin Warfarin  Technical Summary: A multichannel digital EEG recording measured by the international 10-20 system with electrodes applied with paste and impedances below 5000 ohms performed in our laboratory with EKG monitoring in an awake and asleep patient.  Hyperventilation was not performed. Photic stimulation was performed.  The digital EEG was referentially recorded, reformatted, and digitally filtered in a variety of bipolar and referential montages for optimal display.    Description: The patient is awake and asleep during the recording.  During maximal wakefulness, there is a symmetric, medium voltage 8 Hz posterior dominant rhythm that attenuates with eye opening.  The record is symmetric.  During drowsiness and sleep, there is an increase in theta slowing of the background.  Vertex waves and symmetric sleep spindles were seen.  Photic stimulation did not elicit any abnormalities.  There were no epileptiform discharges or electrographic seizures seen.    EKG lead was unremarkable.  Impression: This awake and asleep EEG is normal.    Clinical Correlation: A normal EEG does not exclude a clinical diagnosis of epilepsy. Clinical correlation is advised.   Ellouise Newer, M.D.

## 2017-01-23 NOTE — Progress Notes (Signed)
[place] for warfarin Indication: atrial fibrillation  Allergies  Allergen Reactions  . Cephalexin Hives  . Erythromycin Swelling and Other (See Comments)    REACTION: swollen and sore mouth  . Terbinafine Hcl     Rash   . Colchicine Other (See Comments)    REACTION: violent diarrhea  . Crestor [Rosuvastatin Calcium] Other (See Comments)    Arm pain  . Rosuvastatin Other (See Comments)    REACTION: upper arm pain bilaterally  . Vytorin [Ezetimibe-Simvastatin] Other (See Comments)    Nightmares with generic    Patient Measurements: Height: 5\' 3"  (160 cm) Weight: 125 lb (56.7 kg) IBW/kg (Calculated) : 52.4  Vital Signs: Temp: 98.4 F (36.9 C) (12/07 0821) Temp Source: Oral (12/07 0821) BP: 126/80 (12/07 0821) Pulse Rate: 71 (12/07 0821)  Labs: Recent Labs    01/22/17 1827 01/22/17 1833 01/23/17 0318  HGB 13.2 12.6  --   HCT 37.3 37.0  --   PLT 189  --   --   APTT 41*  --   --   LABPROT 23.1*  --  23.2*  INR 2.07  --  2.08  CREATININE 1.50* 1.40*  --     Estimated Creatinine Clearance: 24.3 mL/min (A) (by C-G formula based on SCr of 1.4 mg/dL (H)).   Assessment: 87 YOF on warfarin at home for Afib. Pharmacy consulted to resume anticoagulation. H/H and Plt wnl. INR on presentation is therapeutic at 2.07.   INR therapeutic today  Home Coumadin dose: 5 mg daily   Goal of Therapy:  INR 2-3 Monitor platelets by anticoagulation protocol: Yes   Plan:  -Continue home warfarin dose of 5 mg daily -Daily PT/INR  Thank you Anette Guarneri, PharmD [phone]

## 2017-01-23 NOTE — Care Management Note (Signed)
Case Management Note  Patient Details  Name: Michele [name] MRN: [id number] Date of Birth: 1930/02/17  Subjective/Objective:    Pt in with syncope. She is from home alone.                Action/Plan: OT feels patient unsafe to be home alone with her current dizziness. CM spoke with the patient and her daughter. The patients daughter is not able to stay with her mother past Sunday. They are interested in [facility] for rehab in [facility]. CSW updated. CM following.  Expected Discharge Date:  01/26/17               Expected Discharge Plan:  Skilled Nursing Facility  In-House Referral:  Clinical Social Work  Discharge planning Services  CM Consult  Post Acute Care Choice:    Choice offered to:     DME Arranged:    DME Agency:     HH Arranged:    [place] Agency:     Status of Service:  In process, will continue to follow  If discussed at Long Length of Stay Meetings, dates discussed:    Additional Comments:  Pollie Friar, RN 01/23/2017, 3:26 PM

## 2017-01-23 NOTE — Progress Notes (Signed)
PROGRESS NOTE    Michele [name]  UJW:[id number] DOB: 08/17/1929 DOA: 01/22/2017 PCP: Biagio Borg, MD   Brief Narrative:  Michele [name] is a 83 y.o. female with medical history significant for atrial fibrillation on warfarin, hyperlipidemia, Gilbert syndrome, chronic kidney disease stage III, and hypothyroidism, now presenting to the emergency department after syncopal episode at a grocery store. Patient reports that she was in her usual state of health and having an uneventful day when she was standing in line at the grocery store, noted some chills and lightheadedness, had an apparent syncopal episode, caught by a fellow shopper before she fell.  She was noted by EMS to have "word salad" and was transported to the ED.  Upon arrival to the ED, EKG features a paced rhythm and chest x-ray is negative for acute cardiopulmonary disease.  Noncontrast head CT is negative for acute intracranial abnormality.  Patient was noted to have speech difficulty and repetitive right arm movements in the ED and neurology was consulted by the ED physician.  Medical admission was recommended for evaluation of Syncope and Aphasia. She is undergoing workup and PT Evaluated and recommending SNF for Rehab.   Assessment & Plan:   Principal Problem:   Syncope Active Problems:   Hypothyroidism, postsurgical   Hyperlipidemia   ATRIAL FIBRILLATION   Automatic implantable cardioverter-defibrillator in situ   CKD (chronic kidney disease) stage 3, GFR 30-59 ml/min (HCC)   Aphasia   Stereotyped movements   Hypokalemia   Hypophosphatemia  Syncope  - Pt presents after a syncopal episode preceded immediately by chills and lightheadedness, caught by bystander on her way down  - Head CT Negative for any acute intracranial abnormality - Plan to continue cardiac monitoring,  - Will need to interrogate pacer and discuss with Cardiology  - Check orthostatic vitals again,  - Obtained TTE and showed EF of 55-60% -  Started Gentle IVF hydration in setting of apparent Hypovolemia and now D/C'd - Urinalysis and Urine Cx Negative  - PT/OT Recommending SNF or Supervision/Assistance 24/hr - Corporate treasurer for Skilled Placement   Aphasia; repetitive RUE movements  - Returned to baseline while still in ED  - Neurology is consulting and much appreciated - Head CT negative for acute intracranial abnormality  - MRI precluded by pacer  - Plan to continue cardiac monitoring,  - Neuro checks,  - Obtained CTA head & neck which showed Negative CTA of the head and neck. No large vessel occlusion, aneurysm, or significant stenosis is identified. Mild calcific atherosclerosis of the aortic arch and carotid siphons. Moderate cervical spondylosis. No high-grade bony foraminal or canal stenosis. - EEG normal and TTE as below;  - Neurology was also recommending TEE but after discussion with [name] not Necessary  Atrial Fibrillation  - In a paced-rhythm on admission  - CHADS-VASc 4 (age x2, gender, HLD) - Continue Warfarin with Pharmacy to Dose  CKD stage III  - SCr is 1.50 on admission, priors in 1.2-range  - Given gentle IVF hydration overnight and BUN/Cr improved to 16/1.22 - Repeat chem panel in am   Hypothyroidism  - TSH was modestly elevated in September with normal T4, followed by PCP  - Repeat TSH and Free T4 - Continue Synthroid   Hypokalemia -K+ was 3.2 -Replete -Repeat Chem Panel in AM  Hypophosphatemia -Patient's Phos Level was 2.3 -Replete -Repeat Phos Level in AM   Hyperlipidemia -Patient's Lipid Panel showed Cholesterol of 242, HDL of 45, LDL of 184, TG  of 63, VLDL of 13 -C/w Vytorin 10-40  1 tab po Daily  Hx of Gilbert Syndrome -Check CMP in AM  DVT prophylaxis: Anticoagulated with Coumadin Code Status: FULL CODE Family Communication: No family present at bedside Disposition Plan: SNF  Consultants:   Neurology [name]   Procedures:  EEG Description: The  patient is awake and asleep during the recording.  During maximal wakefulness, there is a symmetric, medium voltage 8 Hz posterior dominant rhythm that attenuates with eye opening.  The record is symmetric.  During drowsiness and sleep, there is an increase in theta slowing of the background.  Vertex waves and symmetric sleep spindles were seen.  Photic stimulation did not elicit any abnormalities.  There were no epileptiform discharges or electrographic seizures seen.    EKG lead was unremarkable.  Impression: This awake and asleep EEG is normal.    Clinical Correlation: A normal EEG does not exclude a clinical diagnosis of epilepsy. Clinical correlation is advised.  ECHOCARDIOGRAM ------------------------------------------------------------------- Study Conclusions  - Left ventricle: The cavity size was normal. There was mild   concentric hypertrophy. Systolic function was normal. The   estimated ejection fraction was in the range of 55% to 60%. Wall   motion was normal; there were no regional wall motion   abnormalities. The study was not technically sufficient to allow   evaluation of LV diastolic dysfunction due to atrial   fibrillation. - Aortic valve: Trileaflet; moderately thickened, moderately   calcified leaflets. Valve mobility was restricted. Sclerosis   without stenosis. There was moderate regurgitation. - Mitral valve: Calcified annulus. There was mild regurgitation   directed posteriorly. - Left atrium: The atrium was mildly dilated. - Right ventricle: The cavity size was mildly dilated. Wall   thickness was normal. Systolic function was mildly reduced. - Right atrium: The atrium was mildly dilated. Pacer wire or   catheter noted in right atrium. - Tricuspid valve: There was moderate regurgitation.  Antimicrobials:  Anti-infectives (From admission, onward)   None     Subjective: Seen and examined at bedside and felt ok. No CP or SOB. Denied any dizziness or  any other complaints.   Objective: Vitals:   01/23/17 0513 01/23/17 0700 01/23/17 0821 01/23/17 1729  BP: 109/69 131/72 126/80 128/63  Pulse: 74 72 71 70  Resp:      Temp:  97.7 F (36.5 C) 98.4 F (36.9 C) 97.6 F (36.4 C)  TempSrc:  Oral Oral Oral  SpO2: 100% 98% 99% 100%  Weight:      Height:        Intake/Output Summary (Last 24 hours) at 01/23/2017 2015 Last data filed at 01/23/2017 0500 Gross per 24 hour  Intake 410 ml  Output -  Net 410 ml   Filed Weights   01/22/17 1800 01/22/17 1820  Weight: 56.8 kg (125 lb 3.5 oz) 56.7 kg (125 lb)   Examination: Physical Exam:  Constitutional: Thin Caucasian female in NAD and appears calm and comfortable Eyes: Lids and conjunctivae normal, sclerae anicteric  ENMT: External Ears, Nose appear normal. Grossly normal hearing. Mucous membranes are moist. Neck: Appears normal, supple, no cervical masses, normal ROM, no appreciable thyromegaly, no JVD Respiratory: Clear to auscultation bilaterally, no wheezing, rales, rhonchi or crackles. Normal respiratory effort and patient is not tachypenic. No accessory muscle use.  Cardiovascular: Irregularly Irregular; Slight 2/6 murmur. No rubs / gallops. S1 and S2 auscultated. No extremity edema.  Abdomen: Soft, non-tender, non-distended. No masses palpated. No appreciable hepatosplenomegaly. Bowel sounds positive  x4.  GU: Deferred. Musculoskeletal: No clubbing / cyanosis of digits/nails. No joint deformity upper and lower extremities. Skin: No rashes, lesions, ulcers on a limited skin eval. No induration; Warm and dry.  Neurologic: CN 2-12 grossly intact with no focal deficits. Romberg sign and cerebellar reflexes not assessed.  Psychiatric: Normal judgment and insight. Alert and oriented x 3. Normal mood and appropriate affect.   Data Reviewed: I have personally reviewed following labs and imaging studies  CBC: Recent Labs  Lab 01/22/17 1827 01/22/17 1833 01/23/17 0832  WBC 6.3  --  6.0   NEUTROABS 3.5  --  3.2  HGB 13.2 12.6 12.2  HCT 37.3 37.0 35.4*  MCV 89.4  --  90.3  PLT 189  --  782   Basic Metabolic Panel: Recent Labs  Lab 01/22/17 1827 01/22/17 1833 01/23/17 0832  NA 134* 139 136  K 3.7 3.8 3.2*  CL 101 104 104  CO2 21*  --  23  GLUCOSE 91 92 156*  BUN 22* 24* 16  CREATININE 1.50* 1.40* 1.22*  CALCIUM 8.7*  --  8.5*  MG  --   --  1.9  PHOS  --   --  2.3*   GFR: Estimated Creatinine Clearance: 27.9 mL/min (A) (by C-G formula based on SCr of 1.22 mg/dL (H)). Liver Function Tests: Recent Labs  Lab 01/22/17 1827 01/23/17 0832  AST 27 29  ALT 17 16  ALKPHOS 54 50  BILITOT 1.9* 2.0*  PROT 6.7 6.0*  ALBUMIN 3.9 3.4*   No results for input(s): LIPASE, AMYLASE in the last 168 hours. No results for input(s): AMMONIA in the last 168 hours. Coagulation Profile: Recent Labs  Lab 01/22/17 1827 01/23/17 0318  INR 2.07 2.08   Cardiac Enzymes: No results for input(s): CKTOTAL, CKMB, CKMBINDEX, TROPONINI in the last 168 hours. BNP (last 3 results) No results for input(s): PROBNP in the last 8760 hours. HbA1C: Recent Labs    01/23/17 0318  HGBA1C 5.1   CBG: No results for input(s): GLUCAP in the last 168 hours. Lipid Profile: Recent Labs    01/23/17 0318  CHOL 242*  HDL 45  LDLCALC 184*  TRIG 63  CHOLHDL 5.4   Thyroid Function Tests: No results for input(s): TSH, T4TOTAL, FREET4, T3FREE, THYROIDAB in the last 72 hours. Anemia Panel: No results for input(s): VITAMINB12, FOLATE, FERRITIN, TIBC, IRON, RETICCTPCT in the last 72 hours. Sepsis Labs: No results for input(s): PROCALCITON, LATICACIDVEN in the last 168 hours.  No results found for this or any previous visit (from the past 240 hour(s)).   Radiology Studies: Ct Angio Head W Or Wo Contrast  Result Date: 01/23/2017 CLINICAL DATA:  83 y/o  F; slurred speech. EXAM: CT ANGIOGRAPHY HEAD AND NECK TECHNIQUE: Multidetector CT imaging of the head and neck was performed using the  standard protocol during bolus administration of intravenous contrast. Multiplanar CT image reconstructions and MIPs were obtained to evaluate the vascular anatomy. Carotid stenosis measurements (when applicable) are obtained utilizing NASCET criteria, using the distal internal carotid diameter as the denominator. CONTRAST:  61mL ISOVUE-370 IOPAMIDOL (ISOVUE-370) INJECTION 76% COMPARISON:  01/22/2017 CT head. FINDINGS: CTA NECK FINDINGS Aortic arch: Bovine variant branching. Imaged portion shows no evidence of aneurysm or dissection. No significant stenosis of the major arch vessel origins. Mild calcific atherosclerosis. Right carotid system: No evidence of dissection, stenosis (50% or greater) or occlusion. Left carotid system: No evidence of dissection, stenosis (50% or greater) or occlusion. Vertebral arteries: Left dominant. No evidence  of dissection, stenosis (50% or greater) or occlusion. Skeleton: Moderate cervical spondylosis with discogenic degenerative changes greatest at the C5-C7 levels and prominent bilateral facet arthrosis. No high-grade bony foraminal or canal stenosis. Other neck: Negative. Upper chest: Negative. Review of the MIP images confirms the above findings CTA HEAD FINDINGS Anterior circulation: No significant stenosis, proximal occlusion, aneurysm, or vascular malformation. Mild non stenotic calcific atherosclerosis of carotid siphons. Posterior circulation: No significant stenosis, proximal occlusion, aneurysm, or vascular malformation. Venous sinuses: As permitted by contrast timing, patent. Anatomic variants: Patent anterior communicating artery. No posterior communicating artery identified, likely hypoplastic or absent. Review of the MIP images confirms the above findings IMPRESSION: 1. Negative CTA of the head and neck. No large vessel occlusion, aneurysm, or significant stenosis is identified. 2. Mild calcific atherosclerosis of the aortic arch and carotid siphons. 3. Moderate  cervical spondylosis. No high-grade bony foraminal or canal stenosis. Electronically Signed   By: Kristine Garbe M.D.   On: 01/23/2017 01:59   Dg Chest 2 View  Result Date: 01/22/2017 CLINICAL DATA:  Syncopal episode EXAM: CHEST  2 VIEW COMPARISON:  04/01/2016 FINDINGS: Cardiac shadow is stable. Defibrillator is again seen and stable. The lungs are well aerated bilaterally. No focal infiltrate or sizable effusion is seen. Degenerative changes of thoracic spine are noted. IMPRESSION: No active cardiopulmonary disease. Electronically Signed   By: Inez Catalina M.D.   On: 01/22/2017 19:29   Ct Angio Neck W Or Wo Contrast  Result Date: 01/23/2017 CLINICAL DATA:  83 y/o  F; slurred speech. EXAM: CT ANGIOGRAPHY HEAD AND NECK TECHNIQUE: Multidetector CT imaging of the head and neck was performed using the standard protocol during bolus administration of intravenous contrast. Multiplanar CT image reconstructions and MIPs were obtained to evaluate the vascular anatomy. Carotid stenosis measurements (when applicable) are obtained utilizing NASCET criteria, using the distal internal carotid diameter as the denominator. CONTRAST:  96mL ISOVUE-370 IOPAMIDOL (ISOVUE-370) INJECTION 76% COMPARISON:  01/22/2017 CT head. FINDINGS: CTA NECK FINDINGS Aortic arch: Bovine variant branching. Imaged portion shows no evidence of aneurysm or dissection. No significant stenosis of the major arch vessel origins. Mild calcific atherosclerosis. Right carotid system: No evidence of dissection, stenosis (50% or greater) or occlusion. Left carotid system: No evidence of dissection, stenosis (50% or greater) or occlusion. Vertebral arteries: Left dominant. No evidence of dissection, stenosis (50% or greater) or occlusion. Skeleton: Moderate cervical spondylosis with discogenic degenerative changes greatest at the C5-C7 levels and prominent bilateral facet arthrosis. No high-grade bony foraminal or canal stenosis. Other neck:  Negative. Upper chest: Negative. Review of the MIP images confirms the above findings CTA HEAD FINDINGS Anterior circulation: No significant stenosis, proximal occlusion, aneurysm, or vascular malformation. Mild non stenotic calcific atherosclerosis of carotid siphons. Posterior circulation: No significant stenosis, proximal occlusion, aneurysm, or vascular malformation. Venous sinuses: As permitted by contrast timing, patent. Anatomic variants: Patent anterior communicating artery. No posterior communicating artery identified, likely hypoplastic or absent. Review of the MIP images confirms the above findings IMPRESSION: 1. Negative CTA of the head and neck. No large vessel occlusion, aneurysm, or significant stenosis is identified. 2. Mild calcific atherosclerosis of the aortic arch and carotid siphons. 3. Moderate cervical spondylosis. No high-grade bony foraminal or canal stenosis. Electronically Signed   By: Kristine Garbe M.D.   On: 01/23/2017 01:59   Ct Head Code Stroke Wo Contrast`  Result Date: 01/22/2017 CLINICAL DATA:  Code stroke. 83 year old female with slurred speech. Last seen normal 1750 hours. EXAM: CT HEAD WITHOUT CONTRAST  TECHNIQUE: Contiguous axial images were obtained from the base of the skull through the vertex without intravenous contrast. COMPARISON:  CTA head and neck 05/27/2016 and earlier. FINDINGS: Brain: Stable cerebral volume. Patchy and confluent bilateral cerebral white matter hypodensity with some deep white matter capsule involvement appears stable. Stable gray-white matter differentiation elsewhere. No cortically based acute infarct identified. No acute intracranial hemorrhage identified. No midline shift, mass effect, or evidence of intracranial mass lesion. No ventriculomegaly. Vascular: Calcified atherosclerosis at the skull base. No suspicious intracranial vascular hyperdensity. Skull: No acute osseous abnormality identified. Sinuses/Orbits: Visualized paranasal  sinuses and mastoids are stable and well pneumatized. Other: No acute orbit or scalp soft tissue findings. ASPECTS ([place] Stroke Program Early CT Score) - Ganglionic level infarction (caudate, lentiform nuclei, internal capsule, insula, M1-M3 cortex): 7 - Supraganglionic infarction (M4-M6 cortex): 3 Total score (0-10 with 10 being normal): 10 IMPRESSION: 1. Stable noncontrast CT appearance of the brain since April. No intracranial hemorrhage or acute cortically based infarct identified. 2. ASPECTS is 10. 3. These results were communicated to [name] at 01/22/2017 6:09 pmon 01/22/2017 by text page via the [facility] messaging system. Electronically Signed   By: Genevie Ann M.D.   On: 01/22/2017 18:15   Scheduled Meds: . cholecalciferol  1,000 Units Oral Daily  . ezetimibe-simvastatin  1 tablet Oral Daily  . levothyroxine  50 mcg Oral QAC breakfast  . phosphorus  500 mg Oral BID  . warfarin  5 mg Oral q1800  . Warfarin - Pharmacist Dosing Inpatient   Does not apply q1800   Continuous Infusions:   LOS: 0 days   Kerney Elbe, DO [facility] Pager [phone]  If 7PM-7AM, please contact night-coverage [email or web address] Password [email or web address] 01/23/2017, 8:15 PM

## 2017-01-23 NOTE — Progress Notes (Signed)
EEG completed bedside full report to follow. °

## 2017-01-23 NOTE — Evaluation (Signed)
Speech Language Pathology Evaluation Patient Details Name: Michele [name] MRN: [id number] DOB: 25-May-1929 Today's Date: 01/23/2017 Time: 1050-1110 SLP Time Calculation (min) (ACUTE ONLY): 20 min  Problem List:  Patient Active Problem List   Diagnosis Date Noted  . Syncope 01/22/2017  . Aphasia 01/22/2017  . Stereotyped movements 01/22/2017  . Anticoagulated 06/07/2016  . Stroke (cerebrum) ([place]) 05/27/2016  . Vertebrobasilar artery syndrome   . Complicated migraine   . Acute embolic stroke ([place]) 47/10/6281  . AKI (acute kidney injury) ([place]) 12/13/2015  . Urinary retention 12/13/2015  . CKD (chronic kidney disease) stage 3, GFR 30-59 ml/min (HCC) 12/09/2015  . Preventative health care 07/13/2015  . Grief 07/13/2015  . Encounter for therapeutic drug monitoring 04/20/2013  . Cardiomyopathy, secondary ([place]) 03/20/2009  . CHEST PAIN -.Peri INCISIONAL 07/14/2008  . Automatic implantable cardioverter-defibrillator in situ 04/19/2008  . FATIGUE 08/31/2007  . Personal history of other diseases of digestive system 04/14/2007  . Hyperlipidemia 12/22/2006  . Anxiety state 11/27/2006  . KERATOCONJUNCTIVITIS SICCA 09/08/2006  . ATRIAL FIBRILLATION 07/14/2006  . Hypothyroidism, postsurgical 07/09/2006   Past Medical History:  Past Medical History:  Diagnosis Date  . Atrial fibrillation ([place])    NEG STRESS CARDIOLITE  . C. difficile colitis   . Cardiomyopathy    RATE RELATED  . CHF (congestive heart failure) ([place])   . Diverticulitis    PMH of X 2  . Diverticulosis    DIVERTICULITIS  . Gilbert's syndrome   . Hx of colonic polyp 2011   Dr Fuller Plan  . Hyperlipidemia    NMR LIOPROFILE LDL 234(3206/2234)HDL 37,TG86  . Renal insufficiency 12/09/2015  . Thyroid disease    HYPOTHYROIDISM...THYROID NODULES   Past Surgical History:  Past Surgical History:  Procedure Laterality Date  . ABDOMINAL HYSTERECTOMY  1973  . APPENDECTOMY  1956  . BIOPSY THYROID     SINGLE NODULE  . BREAST  BIOPSY  1997  . CARDIAC DEFIBRILLATOR PLACEMENT    . CHOLECYSTECTOMY  1972  . colonoscopy with polypectomy  2011  . EP IMPLANTABLE DEVICE N/A 11/21/2015   Procedure: BIV ICD Generator Changeout;  Surgeon: Deboraha Sprang, MD;  Location: [facility];  Service: Cardiovascular;  Laterality: N/A;  . ICD....02/2008    . LEAD REVISION N/A 08/21/2011   Procedure: LEAD REVISION;  Surgeon: Deboraha Sprang, MD;  Location: [facility] CATH LAB;  Service: Cardiovascular;  Laterality: N/A;  . PACEMAKER PLACEMENT     10/2006  . ROTATOR CUFF REPAIR     x2  . THROIDECTOMY 12/08     benign   HPI:  Michele Cizek Pettyis a 83 y.o.femalewith medical history significant foratrial fibrillation on warfarin, hyperlipidemia,Gilbert syndrome,chronic kidney disease stage III, and hypothyroidism, now presenting to the emergency department after syncopal episode at a grocery store. Patient reports that she was in her usual state of health and having an uneventful day when she was standing in line at the grocery store, noted some chills andlightheadedness, had an apparent syncopal episode, caught by a fellow shopper before she fell. She was noted by EMS to have "word salad" and was transported to the ED.  Head CT is negative for acute findings.  MRI is pending.     Assessment / Plan / Recommendation Clinical Impression  Language and motor speech screen were completed.  The patient's speech was clear and easy to understand.  No discernible dysarthria was noted.  Oral mechanism exam was unremarkable.  The patient reported issues getting her words out but  that things had totally resolved.  The patient's speech was fluent.  She was able to answer yes/no questions, identify pictures, follow complex directions, read/comprehend short sentences, repeat words and phrases, name pictures, answer "wh" questions and perform sentence completion.  The patient's receptive and expressive language skills appeared to be functional.  ST follow up  is not indicated.      SLP Assessment  SLP Recommendation/Assessment: Patient does not need any further Speech Lanaguage Pathology Services SLP Visit Diagnosis: Aphasia (R47.01)    Follow Up Recommendations  None          SLP Evaluation Cognition  Overall Cognitive Status: Within Functional Limits for tasks assessed Arousal/Alertness: Awake/alert Orientation Level: Oriented to person;Oriented to place;Oriented to situation       Comprehension  Auditory Comprehension Overall Auditory Comprehension: Appears within functional limits for tasks assessed Yes/No Questions: Within Functional Limits Commands: Within Functional Limits Conversation: Complex Reading Comprehension Reading Status: Within funtional limits    Expression Expression Primary Mode of Expression: Verbal Verbal Expression Overall Verbal Expression: Appears within functional limits for tasks assessed Initiation: No impairment Automatic Speech: Name;Social Response Level of Generative/Spontaneous Verbalization: Conversation Repetition: No impairment Naming: No impairment Pragmatics: No impairment Non-Verbal Means of Communication: Not applicable Written Expression Dominant Hand: Right Written Expression: Not tested   Oral / Motor  Oral Motor/Sensory Function Overall Oral Motor/Sensory Function: Within functional limits Motor Speech Overall Motor Speech: Appears within functional limits for tasks assessed Respiration: Within functional limits Phonation: Normal Resonance: Within functional limits Articulation: Within functional limitis Intelligibility: Intelligible Motor Planning: Witnin functional limits Motor Speech Errors: Not applicable   GO          Functional Assessment Tool Used: Clinical judgment and results of evaluation.   Functional Limitations: Spoken language expressive Spoken Language Expression Current Status [phone]): 0 percent impaired, limited or restricted Spoken Language Expression  Goal Status [phone]): 0 percent impaired, limited or restricted Spoken Language Expression Discharge Status [phone]): 0 percent impaired, limited or restricted         Lamar Sprinkles 01/23/2017, 11:23 AM

## 2017-01-23 NOTE — Progress Notes (Signed)
  Echocardiogram 2D Echocardiogram has been performed.  Caress Reffitt T Erikah Thumm 01/23/2017, 4:10 PM

## 2017-01-23 NOTE — Progress Notes (Signed)
Patient was admitted yesterday ,brought by EMS through ED  c/o syncope episode in [employer] store.was  Aphasic per EMS en route to hospital. Pt now alert and oriented x 4 no aphasia, pain SOB, weakness nor lightheadedness.. MA E muscle strength.5/5.  Forgetful but no change in neuro status. Spoke with daughter per pt request., pt' belonging at bedside. Money in amount of $196.in handbag. at bedside. Pt did not want money lock up in safe for her. A_V paced on cardiac monitor. RN Will continue to monitor pt.

## 2017-01-23 NOTE — Progress Notes (Signed)
  Echocardiogram 2D Echocardiogram has been performed.  Michele [name] [name] 01/23/2017, 4:10 PM

## 2017-01-23 NOTE — Evaluation (Signed)
Occupational Therapy Evaluation Patient Details Name: Michele [name] MRN: [id number] DOB: 06-29-1929 Today's Date: 01/23/2017    History of Present Illness  83 y.o. female with medical history significant for atrial fibrillation on warfarin, hyperlipidemia, Gilbert syndrome, chronic kidney disease stage III, and hypothyroidism, now presenting to the emergency department after syncopal episode at a grocery store.  Patient reports that she was in her usual state of health and having an uneventful day when she was standing in line at the grocery store, noted some chills and lightheadedness, had an apparent syncopal episode, caught by a fellow shopper before she fell.  She was noted by EMS to have "word salad" and was transported to the ED.  Head CT is negative for acute findings.  MRI is pending.   Clinical Impression   PTA, pt was living alone and was independent with ADLs, light IADLs, and driving. Pt currently performing LB ADLs and functional mobility with Min A for balance. Pt with three LOB and required Min A to correct. Pt would benefit from acute OT to facilitate safe dc. Recommend dc with post-acute OT to increase safety and independence with ADLs and requires supervision for safety due to decreased balance.     Follow Up Recommendations  SNF;Supervision/Assistance - 24 hour    Equipment Recommendations  None recommended by OT    Recommendations for Other Services PT consult     Precautions / Restrictions Precautions Precautions: Fall Restrictions Weight Bearing Restrictions: No      Mobility Bed Mobility Overal bed mobility: Needs Assistance Bed Mobility: Supine to Sit     Supine to sit: Supervision     General bed mobility comments: supervision for safety  Transfers Overall transfer level: Needs assistance Equipment used: 1 person hand held assist Transfers: Sit to/from Stand Sit to Stand: Min assist         General transfer comment: Min A for correcting  balance. Pt requiring three attempts for standing    Balance Overall balance assessment: Needs assistance Sitting-balance support: No upper extremity supported;Feet supported Sitting balance-Leahy Scale: Fair     Standing balance support: Single extremity supported;No upper extremity supported;During functional activity Standing balance-Leahy Scale: Fair Standing balance comment: Pt able to maintain static standing balace. Three LOB during dynamic balance                           ADL either performed or assessed with clinical judgement   ADL Overall ADL's : Needs assistance/impaired Eating/Feeding: Set up;Sitting   Grooming: Wash/dry hands;Min guard;Standing;Oral care;Brushing hair   Upper Body Bathing: Set up;Sitting   Lower Body Bathing: Sit to/from stand;Minimal assistance   Upper Body Dressing : Set up;Sitting   Lower Body Dressing: Sit to/from stand;Minimal assistance   Toilet Transfer: Min guard;Ambulation;Regular Museum/gallery exhibitions officer and Hygiene: Min guard;Sit to/from stand       Functional mobility during ADLs: Min guard;Minimal assistance(Generally [employer]. Min A for 3 LOB to correct) General ADL Comments: Pt performing ADLs and fucnitonal mobility with Min Guard A for safety. Pt with three LOB during functional mobility. When asked about LOB, pt reporting she gets dizzy. Feel pt fucnitoning near baseline level but will need supervision at dc for safety due to LOB.      Vision Baseline Vision/History: Wears glasses Wears Glasses: Reading only(Driving) Patient Visual Report: No change from baseline       Perception     Praxis  Pertinent Vitals/Pain Pain Assessment: No/denies pain     Hand Dominance Right   Extremity/Trunk Assessment Upper Extremity Assessment Upper Extremity Assessment: Overall WFL for tasks assessed   Lower Extremity Assessment Lower Extremity Assessment: Defer to PT evaluation   Cervical /  Trunk Assessment Cervical / Trunk Assessment: Normal   Communication Communication Communication: No difficulties   Cognition Arousal/Alertness: Awake/alert Behavior During Therapy: WFL for tasks assessed/performed Overall Cognitive Status: Within Functional Limits for tasks assessed                                     General Comments       Exercises     Shoulder Instructions      Home Living Family/patient expects to be discharged to:: Private residence Living Arrangements: Alone Available Help at Discharge: Family;Other (Comment);Available 24 hours/day(Daughter planning to stay at dc) Type of Home: House Home Access: Stairs to enter [employer] of Steps: 4 at one entrance, 2 at another Entrance Stairs-Rails: Left;Right Home Layout: Two level;Bed/bath upstairs Alternate Level Stairs-Number of Steps: flight Alternate Level Stairs-Rails: Left Bathroom Shower/Tub: Teacher, early years/pre: Standard     Home Equipment: None          Prior Functioning/Environment Level of Independence: Independent        Comments: ADLs, light IADLs, and driving        OT Problem List: Decreased activity tolerance;Impaired balance (sitting and/or standing);Decreased safety awareness;Decreased knowledge of use of DME or AE      OT Treatment/Interventions: Self-care/ADL training;Therapeutic exercise;Energy conservation;DME and/or AE instruction;Therapeutic activities;Patient/family education    OT Goals(Current goals can be found in the care plan section) Acute Rehab OT Goals Patient Stated Goal: Go home OT Goal Formulation: With patient Time For Goal Achievement: 02/06/17 Potential to Achieve Goals: Good ADL Goals Pt Will Perform Grooming: with modified independence;standing Pt Will Transfer to Toilet: with modified independence;regular height toilet;ambulating Pt Will Perform Tub/Shower Transfer: Tub transfer;shower seat;with modified  independence;ambulating Additional ADL Goal #1: Pt will independently verbalize three fall prevention strategies.  OT Frequency: Min 2X/week   Barriers to D/C:            Co-evaluation              AM-PAC PT "6 Clicks" Daily Activity     Outcome Measure Help from another person eating meals?: None Help from another person taking care of personal grooming?: A Little Help from another person toileting, which includes using toliet, bedpan, or urinal?: A Little Help from another person bathing (including washing, rinsing, drying)?: A Little Help from another person to put on and taking off regular upper body clothing?: None Help from another person to put on and taking off regular lower body clothing?: A Little 6 Click Score: 20   End of Session Equipment Utilized During Treatment: Gait belt Nurse Communication: Mobility status  Activity Tolerance: Patient tolerated treatment well Patient left: in chair;with call bell/phone within reach  OT Visit Diagnosis: Unsteadiness on feet (R26.81);Other abnormalities of gait and mobility (R26.89)                Time: 1610-9604 OT Time Calculation (min): 31 min Charges:  OT General Charges $OT Visit: 1 Visit OT Evaluation $OT Eval Low Complexity: 1 Low OT Treatments $Self Care/Home Management : 8-22 mins G-Codes: OT G-codes **NOT FOR INPATIENT CLASS** Functional Assessment Tool Used: Clinical judgement Functional Limitation: Self care  Self Care Current Status [phone]): At least 20 percent but less than 40 percent impaired, limited or restricted Self Care Goal Status ([id number]): At least 1 percent but less than 20 percent impaired, limited or restricted   [place], OTR/L Acute Rehab Pager: [phone] Office: [place] 01/23/2017, 1:01 PM

## 2017-01-23 NOTE — Care Management Obs Status (Signed)
[place] NOTIFICATION   Patient Details  Name: LAKEIA BRADSHAW MRN: [id number] Date of Birth: 03/13/1929   Medicare Observation Status Notification Given:  Yes    Pollie Friar, RN 01/23/2017, 5:15 PM

## 2017-01-23 NOTE — Clinical Social Work Note (Signed)
Clinical Social Work Assessment  Patient Details  Name: Michele [name] MRN: [id number] Date of Birth: April 25, 1929  Date of referral:  01/23/17               Reason for consult:  Discharge Planning                Permission sought to share information with:  Facility Sport and exercise psychologist, Family Supports Permission granted to share information::  Yes, Verbal Permission Granted  Name::     [name]  Agency::  SNFs  Relationship::  daughter  Contact Information:  [phone]  Housing/Transportation Living arrangements for the past 2 months:  [place] of Information:  Patient Patient Interpreter Needed:  None Criminal Activity/Legal Involvement Pertinent to Current Situation/Hospitalization:  No - Comment as needed Significant Relationships:  Adult Children Lives with:  Self Do you feel safe going back to the place where you live?  Yes Need for family participation in patient care:  No (Coment)  Care giving concerns: Patient from home alone. PT recommending SNF.   Social Worker assessment / plan: CSW met with patient at bedside. Patient alert and oriented. CSW discussed recommendation for SNF. Patient agreeable to short term SNF before returning home. CSW sent out initial referrals. CSW to follow with bed offers and support with discharge.  Employment status:  Retired Research officer, political party) PT Recommendations:  [place] / Referral to community resources:  [place]  Patient/Family's Response to care: Patient appreciative of care.  Patient/Family's Understanding of and Emotional Response to Diagnosis, Current Treatment, and Prognosis: Patient with understanding of her condition and understanding of recommendation for rehab.  Emotional Assessment Appearance:  Appears stated age Attitude/Demeanor/Rapport:  Other(appropriate) Affect (typically observed):  Calm, Pleasant, Accepting Orientation:   Oriented to Self, Oriented to Place, Oriented to  Time, Oriented to Situation Alcohol / Substance use:  Not Applicable Psych involvement (Current and /or in the community):  No (Comment)  Discharge Needs  Concerns to be addressed:  Care Coordination, Discharge Planning Concerns Readmission within the last 30 days:  No Current discharge risk:  Physical Impairment Barriers to Discharge:  Continued Medical Work up   Estanislado Emms, LCSW 01/23/2017, 4:35 PM

## 2017-01-23 NOTE — NC FL2 (Signed)
[place] LEVEL OF CARE SCREENING TOOL     IDENTIFICATION  Patient Name: Michele [name] Birthdate: [birth year] Sex: female Admission Date (Current Location): 01/22/2017  [facility] and [place] Number:  Herbalist and Address:  [facility], [place] [address], [place], [place] [address]      Provider Number: [id number]  Attending Physician Name and Address:  Kerney Elbe, DO  Relative Name and Phone Number:  [name]. daughter, [phone]    Current Level of Care: Hospital Recommended Level of Care: [place] Prior Approval Number:    Date Approved/Denied:   PASRR Number: [id number]  Discharge Plan: SNF    Current Diagnoses: Patient Active Problem List   Diagnosis Date Noted  . Syncope 01/22/2017  . Aphasia 01/22/2017  . Stereotyped movements 01/22/2017  . Anticoagulated 06/07/2016  . Stroke (cerebrum) ([place]) 05/27/2016  . Vertebrobasilar artery syndrome   . Complicated migraine   . Acute embolic stroke ([place]) 78/93/8101  . AKI (acute kidney injury) ([place]) 12/13/2015  . Urinary retention 12/13/2015  . CKD (chronic kidney disease) stage 3, GFR 30-59 ml/min (HCC) 12/09/2015  . Preventative health care 07/13/2015  . Grief 07/13/2015  . Encounter for therapeutic drug monitoring 04/20/2013  . Cardiomyopathy, secondary ([place]) 03/20/2009  . CHEST PAIN -.Peri INCISIONAL 07/14/2008  . Automatic implantable cardioverter-defibrillator in situ 04/19/2008  . FATIGUE 08/31/2007  . Personal history of other diseases of digestive system 04/14/2007  . Hyperlipidemia 12/22/2006  . Anxiety state 11/27/2006  . KERATOCONJUNCTIVITIS SICCA 09/08/2006  . ATRIAL FIBRILLATION 07/14/2006  . Hypothyroidism, postsurgical 07/09/2006    Orientation RESPIRATION BLADDER Height & Weight     Self, Situation, Place, Time  Normal Continent Weight: 125 lb (56.7 kg) Height:  5\' 3"  (160 cm)  BEHAVIORAL SYMPTOMS/MOOD NEUROLOGICAL  BOWEL NUTRITION STATUS      Continent Diet(please see DC summary)  AMBULATORY STATUS COMMUNICATION OF NEEDS Skin   Limited Assist Verbally Normal                       Personal Care Assistance Level of Assistance  Bathing, Feeding, Dressing Bathing Assistance: Limited assistance Feeding assistance: Independent Dressing Assistance: Limited assistance     Functional Limitations Info  Sight, Hearing, Speech Sight Info: Adequate Hearing Info: Adequate Speech Info: Adequate    SPECIAL CARE FACTORS FREQUENCY  PT (By licensed PT)     PT Frequency: 5x/week              Contractures Contractures Info: Not present    Additional Factors Info  Code Status, Allergies Code Status Info: Full Allergies Info: Cephalexin, Erythromycin, Terbinafine Hcl, Colchicine, Crestor Rosuvastatin Calcium, Rosuvastatin, Vytorin Ezetimibe-simvastatin           Current Medications (01/23/2017):  This is the current hospital active medication list Current Facility-Administered Medications  Medication Dose Route Frequency Provider Last Rate Last Dose  . acetaminophen (TYLENOL) tablet 650 mg  650 mg Oral Q4H PRN Opyd, Ilene Qua, MD       Or  . acetaminophen (TYLENOL) solution 650 mg  650 mg Per Tube Q4H PRN Opyd, Ilene Qua, MD       Or  . acetaminophen (TYLENOL) suppository 650 mg  650 mg Rectal Q4H PRN Opyd, Ilene Qua, MD      . cholecalciferol (VITAMIN D) tablet 1,000 Units  1,000 Units Oral Daily Opyd, Ilene Qua, MD   1,000 Units at 01/23/17 1105  . ezetimibe-simvastatin (VYTORIN) 10-40 MG  per tablet 1 tablet  1 tablet Oral Daily Opyd, Ilene Qua, MD   1 tablet at 01/23/17 1111  . levothyroxine (SYNTHROID, LEVOTHROID) tablet 50 mcg  50 mcg Oral QAC breakfast Opyd, Ilene Qua, MD   50 mcg at 01/23/17 1105  . phosphorus (K PHOS NEUTRAL) tablet 500 mg  500 mg Oral BID Sheikh, Omair Latif, DO      . senna-docusate (Senokot-S) tablet 1 tablet  1 tablet Oral QHS PRN Opyd, Ilene Qua, MD      .  warfarin (COUMADIN) tablet 5 mg  5 mg Oral q1800 Mancheril, [name], RPH      . Warfarin - Pharmacist Dosing Inpatient   Does not apply q1800 Lavenia [facility]         Discharge Medications: Please see discharge summary for a list of discharge medications.  Relevant Imaging Results:  Relevant Lab Results:   Additional Information SSN: [id number]  Estanislado Emms, LCSW

## 2017-01-24 DIAGNOSIS — Z95810 Presence of automatic (implantable) cardiac defibrillator: Secondary | ICD-10-CM | POA: Diagnosis not present

## 2017-01-24 DIAGNOSIS — R4701 Aphasia: Secondary | ICD-10-CM | POA: Diagnosis not present

## 2017-01-24 DIAGNOSIS — R55 Syncope and collapse: Secondary | ICD-10-CM | POA: Diagnosis not present

## 2017-01-24 DIAGNOSIS — I482 Chronic atrial fibrillation: Secondary | ICD-10-CM | POA: Diagnosis not present

## 2017-01-24 DIAGNOSIS — N183 Chronic kidney disease, stage 3 (moderate): Secondary | ICD-10-CM | POA: Diagnosis not present

## 2017-01-24 LAB — GLUCOSE, CAPILLARY: Glucose-Capillary: 87 mg/dL (ref 65–99)

## 2017-01-24 NOTE — Discharge Summary (Signed)
Physician Discharge Summary  Michele [name] IZT:[id number] DOB: Jun 20, 1929 DOA: 01/22/2017  PCP: Biagio Borg, MD  Admit date: 01/22/2017 Discharge date: 01/24/2017  Admitted From: Home Disposition: Signed out Against Medical Advice; Recommendation was for SNF  Recommendations for Outpatient Follow-up:  1. Follow up with PCP in 1-2 weeks 2. Follow up with Cardiology Dr. Caryl Comes for Pacemaker Interrogation 3. Please obtain CMP/CBC, Mag, Phos in one week  Home Health: No Refused Equipment/Devices: None   Discharge Condition: Guarded CODE STATUS: FULL CODE Diet recommendation: Heart Healthy Diet  Brief/Interim Summary: Michele [name] a 83 y.o.femalewith medical history significant foratrial fibrillation on warfarin, hyperlipidemia,Gilbert syndrome,chronic kidney disease stage III, and hypothyroidism, now presenting to the emergency department after syncopal episode at a grocery store. Patient reports that she was in her usual state of health and having an uneventful day when she was standing in line at the grocery store, noted some chills andlightheadedness, had an apparent syncopal episode, caught by a fellow shopper before she fell. She was noted by EMS to have "word salad" and was transported to the ED.  Upon arrival to the [facility] features a paced rhythm and chest x-ray is negative for acute cardiopulmonary disease. Noncontrast head CT is negative for acute intracranial abnormality. Patient was noted to have speech difficulty and repetitive right arm movements in the ED and neurology was consulted by the ED physician. Medical admission was recommended for evaluation of Syncope and Aphasia. She is undergoing workup and PT Evaluated and recommending SNF for Rehab. Patient became agitated overnight refusing blood work and becoming hostile with the staff. When I went to examine her this AM she had ripped out her IV and blood was dripping on the floor. I spoke to her and she had not  gotten her Pacer/AICD interrogated and refused orthostatic vital signs and refused for me to examine her. She was of sound mind and decision making and decided to leave against medical advice and Signed out. Daughter was there and urged her to complete workup but patient was adamant and signed out AMA.  Discharge Diagnoses:  Principal Problem:   Syncope Active Problems:   Hypothyroidism, postsurgical   Hyperlipidemia   ATRIAL FIBRILLATION   Automatic implantable cardioverter-defibrillator in situ   CKD (chronic kidney disease) stage 3, GFR 30-59 ml/min (HCC)   Aphasia   Stereotyped movements   Hypokalemia   Hypophosphatemia  Syncope -Pt presented after a syncopal episode preceded immediately by chills and lightheadedness, caught by bystander on her way down - Head CT Negative for any acute intracranial abnormality -Plan was continue cardiac monitoring,  - Pacer was to be interrogated but patient adamantly refused - Orthostatic vitals never done again as patient refused,  - Obtained TTE and showed EF of 55-60% - Started Gentle IVF hydration in setting of apparent Hypovolemiaand now D/C'd - Urinalysis and Urine Cx Negative  - PT/OT Recommending SNF or Supervision/Assistance 24/hr - Corporate treasurer for [employer]; patient refused and Signed out Against Medical Advice  Aphasia; repetitive RUE movements - Returned to baseline while still in ED -Neurology is consulting and much appreciated - Head CT negative for acute intracranial abnormality  - MRI precluded by pacer -Plan to continue cardiac monitoring,  - Neuro checks - Obtained CTA head &neck which showed Negative CTA of the head and neck. No large vessel occlusion, aneurysm, or significant stenosis is identified. Mild calcific atherosclerosis of the aortic arch and carotid siphons. Moderate cervical spondylosis. No high-grade bony foraminal or canal stenosis. -  EEG normal and TTE as below;  - Neurology  was also recommending TEE but after discussion with [name] not Necessary - Patient signed out Against Medical Advice  Atrial Fibrillation -In a paced-rhythm on admission -CHADS-VASc 4 (age x2, gender, HLD) -Continued Warfarin with Pharmacy to Dose - PT/INR never done as patient refused blood work and signed out AMA  CKD stage III -SCr is 1.50 on admission, priors in 1.2-range -Given gentle IVF hydration overnight and BUN/Cr improved to 16/1.22 - Repeat CMP this AM never done as patient refused all blood work and signed out AMA  Hypothyroidism -TSH was modestly elevated in September with normal T4, followed by PCP - Repeat TSH and Free T4 never done as patient refused blood work and signed out [employer] -Continue Synthroidas an outpatient   Hypokalemia -K+ was 3.2 -Replete -Repeat Chem Panel never done this AM as patient signed out AMA  Hypophosphatemia -Patient's Phos Level was 2.3 -Replete -Repeat Phos Level never done as patient refused blood work and signed out AMA  Hyperlipidemia -Patient's Lipid Panel showed Cholesterol of 242, HDL of 45, LDL of 184, TG of 63, VLDL of 13 -C/w Vytorin 10-40  1 tab po Daily -Signed out AMA  Hx of Gilbert Syndrome -Attempted to check CMP this AM but patient refused and Signed Out AMA  Discharge Instructions  Discharge Instructions    Call MD for:  difficulty breathing, headache or visual disturbances   Complete by:  As directed    Call MD for:  extreme fatigue   Complete by:  As directed    Call MD for:  hives   Complete by:  As directed    Call MD for:  persistant dizziness or light-headedness   Complete by:  As directed    Call MD for:  persistant nausea and vomiting   Complete by:  As directed    Call MD for:  redness, tenderness, or signs of infection (pain, swelling, redness, odor or green/yellow discharge around incision site)   Complete by:  As directed    Call MD for:  severe uncontrolled pain   Complete  by:  As directed    Call MD for:  temperature >100.4   Complete by:  As directed    Diet - low sodium heart healthy   Complete by:  As directed    Discharge instructions   Complete by:  As directed    Follow up Care with PCP and Cardiology. Take all medications as prescribed. If symptoms change or worsen return to the ED for evaluation.   Increase activity slowly   Complete by:  As directed      Allergies as of 01/24/2017      Reactions   Cephalexin Hives   Erythromycin Swelling, Other (See Comments)   REACTION: swollen and sore mouth   Terbinafine Hcl    Rash   Colchicine Other (See Comments)   REACTION: violent diarrhea   Crestor [rosuvastatin Calcium] Other (See Comments)   Arm pain   Rosuvastatin Other (See Comments)   REACTION: upper arm pain bilaterally   Vytorin [ezetimibe-simvastatin] Other (See Comments)   Nightmares with generic      Medication List    TAKE these medications   cholecalciferol 1000 units tablet Commonly known as:  VITAMIN D Take 1,000 Units by mouth daily.   co-enzyme Q-10 30 MG capsule Take 30 mg by mouth daily.   furosemide 20 MG tablet Commonly known as:  LASIX TAKE 1 TABLET (20 MG TOTAL)  BY MOUTH AS NEEDED FOR FLUID.   levothyroxine 50 MCG tablet Commonly known as:  SYNTHROID, LEVOTHROID Take 1 tablet (50 mcg total) by mouth daily.   Pitavastatin Calcium 4 MG Tabs Commonly known as:  LIVALO 1 tab by mouth daily   VYTORIN 10-40 MG tablet Generic drug:  ezetimibe-simvastatin Take 1 tablet by mouth daily.   warfarin 5 MG tablet Commonly known as:  COUMADIN Take as directed. If you are unsure how to take this medication, talk to your nurse or doctor. Original instructions:  TAKE AS DIRECTED. What changed:    how much to take  how to take this  when to take this  additional instructions       Allergies  Allergen Reactions  . Cephalexin Hives  . Erythromycin Swelling and Other (See Comments)    REACTION: swollen and  sore mouth  . Terbinafine Hcl     Rash   . Colchicine Other (See Comments)    REACTION: violent diarrhea  . Crestor [Rosuvastatin Calcium] Other (See Comments)    Arm pain  . Rosuvastatin Other (See Comments)    REACTION: upper arm pain bilaterally  . Vytorin [Ezetimibe-Simvastatin] Other (See Comments)    Nightmares with generic   Consultations: None  Procedures/Studies: Ct Angio Head W Or Wo Contrast  Result Date: 01/23/2017 CLINICAL DATA:  83 y/o  F; slurred speech. EXAM: CT ANGIOGRAPHY HEAD AND NECK TECHNIQUE: Multidetector CT imaging of the head and neck was performed using the standard protocol during bolus administration of intravenous contrast. Multiplanar CT image reconstructions and MIPs were obtained to evaluate the vascular anatomy. Carotid stenosis measurements (when applicable) are obtained utilizing NASCET criteria, using the distal internal carotid diameter as the denominator. CONTRAST:  5mL ISOVUE-370 IOPAMIDOL (ISOVUE-370) INJECTION 76% COMPARISON:  01/22/2017 CT head. FINDINGS: CTA NECK FINDINGS Aortic arch: Bovine variant branching. Imaged portion shows no evidence of aneurysm or dissection. No significant stenosis of the major arch vessel origins. Mild calcific atherosclerosis. Right carotid system: No evidence of dissection, stenosis (50% or greater) or occlusion. Left carotid system: No evidence of dissection, stenosis (50% or greater) or occlusion. Vertebral arteries: Left dominant. No evidence of dissection, stenosis (50% or greater) or occlusion. Skeleton: Moderate cervical spondylosis with discogenic degenerative changes greatest at the C5-C7 levels and prominent bilateral facet arthrosis. No high-grade bony foraminal or canal stenosis. Other neck: Negative. Upper chest: Negative. Review of the MIP images confirms the above findings CTA HEAD FINDINGS Anterior circulation: No significant stenosis, proximal occlusion, aneurysm, or vascular malformation. Mild non stenotic  calcific atherosclerosis of carotid siphons. Posterior circulation: No significant stenosis, proximal occlusion, aneurysm, or vascular malformation. Venous sinuses: As permitted by contrast timing, patent. Anatomic variants: Patent anterior communicating artery. No posterior communicating artery identified, likely hypoplastic or absent. Review of the MIP images confirms the above findings IMPRESSION: 1. Negative CTA of the head and neck. No large vessel occlusion, aneurysm, or significant stenosis is identified. 2. Mild calcific atherosclerosis of the aortic arch and carotid siphons. 3. Moderate cervical spondylosis. No high-grade bony foraminal or canal stenosis. Electronically Signed   By: Kristine Garbe M.D.   On: 01/23/2017 01:59   Dg Chest 2 View  Result Date: 01/22/2017 CLINICAL DATA:  Syncopal episode EXAM: CHEST  2 VIEW COMPARISON:  04/01/2016 FINDINGS: Cardiac shadow is stable. Defibrillator is again seen and stable. The lungs are well aerated bilaterally. No focal infiltrate or sizable effusion is seen. Degenerative changes of thoracic spine are noted. IMPRESSION: No active cardiopulmonary disease. Electronically  Signed   By: Inez Catalina M.D.   On: 01/22/2017 19:29   Ct Angio Neck W Or Wo Contrast  Result Date: 01/23/2017 CLINICAL DATA:  83 y/o  F; slurred speech. EXAM: CT ANGIOGRAPHY HEAD AND NECK TECHNIQUE: Multidetector CT imaging of the head and neck was performed using the standard protocol during bolus administration of intravenous contrast. Multiplanar CT image reconstructions and MIPs were obtained to evaluate the vascular anatomy. Carotid stenosis measurements (when applicable) are obtained utilizing NASCET criteria, using the distal internal carotid diameter as the denominator. CONTRAST:  13mL ISOVUE-370 IOPAMIDOL (ISOVUE-370) INJECTION 76% COMPARISON:  01/22/2017 CT head. FINDINGS: CTA NECK FINDINGS Aortic arch: Bovine variant branching. Imaged portion shows no evidence of  aneurysm or dissection. No significant stenosis of the major arch vessel origins. Mild calcific atherosclerosis. Right carotid system: No evidence of dissection, stenosis (50% or greater) or occlusion. Left carotid system: No evidence of dissection, stenosis (50% or greater) or occlusion. Vertebral arteries: Left dominant. No evidence of dissection, stenosis (50% or greater) or occlusion. Skeleton: Moderate cervical spondylosis with discogenic degenerative changes greatest at the C5-C7 levels and prominent bilateral facet arthrosis. No high-grade bony foraminal or canal stenosis. Other neck: Negative. Upper chest: Negative. Review of the MIP images confirms the above findings CTA HEAD FINDINGS Anterior circulation: No significant stenosis, proximal occlusion, aneurysm, or vascular malformation. Mild non stenotic calcific atherosclerosis of carotid siphons. Posterior circulation: No significant stenosis, proximal occlusion, aneurysm, or vascular malformation. Venous sinuses: As permitted by contrast timing, patent. Anatomic variants: Patent anterior communicating artery. No posterior communicating artery identified, likely hypoplastic or absent. Review of the MIP images confirms the above findings IMPRESSION: 1. Negative CTA of the head and neck. No large vessel occlusion, aneurysm, or significant stenosis is identified. 2. Mild calcific atherosclerosis of the aortic arch and carotid siphons. 3. Moderate cervical spondylosis. No high-grade bony foraminal or canal stenosis. Electronically Signed   By: Kristine Garbe M.D.   On: 01/23/2017 01:59   US Renal  Result Date: 01/05/2017 CLINICAL DATA:  Stage III chronic renal disease. EXAM: RENAL / URINARY TRACT ULTRASOUND COMPLETE COMPARISON:  None. FINDINGS: Right Kidney: Length: 9.8 cm. Echogenicity within normal limits. No mass or hydronephrosis visualized. Left Kidney: Length: 8.3 cm. Echogenicity within normal limits. No mass or hydronephrosis visualized.  Bladder: Appears normal for degree of bladder distention. IMPRESSION: There is maintained cortical-medullary distinction within both kidneys without significant cortical scarring nor obstructive uropathy. The bladder appears unremarkable for degree of distention. Electronically Signed   By: Ashley Royalty M.D.   On: 01/05/2017 21:21   Ct Head Code Stroke Wo Contrast`  Result Date: 01/22/2017 CLINICAL DATA:  Code stroke. 83 year old female with slurred speech. Last seen normal 1750 hours. EXAM: CT HEAD WITHOUT CONTRAST TECHNIQUE: Contiguous axial images were obtained from the base of the skull through the vertex without intravenous contrast. COMPARISON:  CTA head and neck 05/27/2016 and earlier. FINDINGS: Brain: Stable cerebral volume. Patchy and confluent bilateral cerebral white matter hypodensity with some deep white matter capsule involvement appears stable. Stable gray-white matter differentiation elsewhere. No cortically based acute infarct identified. No acute intracranial hemorrhage identified. No midline shift, mass effect, or evidence of intracranial mass lesion. No ventriculomegaly. Vascular: Calcified atherosclerosis at the skull base. No suspicious intracranial vascular hyperdensity. Skull: No acute osseous abnormality identified. Sinuses/Orbits: Visualized paranasal sinuses and mastoids are stable and well pneumatized. Other: No acute orbit or scalp soft tissue findings. ASPECTS [facility] Stroke Program Early CT Score) - Ganglionic level infarction (caudate,  lentiform nuclei, internal capsule, insula, M1-M3 cortex): 7 - Supraganglionic infarction (M4-M6 cortex): 3 Total score (0-10 with 10 being normal): 10 IMPRESSION: 1. Stable noncontrast CT appearance of the brain since April. No intracranial hemorrhage or acute cortically based infarct identified. 2. ASPECTS is 10. 3. These results were communicated to [name] at 01/22/2017 6:09 pmon 01/22/2017 by text page via the [name] messaging system.  Electronically Signed   By: Genevie Ann M.D.   On: 01/22/2017 18:15   EEG Description: The patient is awake andasleepduring the recording. During maximal wakefulness, there is a symmetric, medium voltage 8Hz  posterior dominant rhythm that attenuates with eye opening. The record is symmetric. During drowsiness and sleep, there is an increase in theta slowing of the background. Vertex waves and symmetric sleep spindles were seen. Photic stimulation did not elicit any abnormalities. There were no epileptiform discharges or electrographic seizures seen.   EKG lead was unremarkable.  Impression: This awake andasleepEEG is normal.   Clinical Correlation: A normal EEG does not exclude a clinical diagnosis of epilepsy. Clinical correlation is advised.  ECHOCARDIOGRAM ------------------------------------------------------------------- Study Conclusions  - Left ventricle: The cavity size was normal. There was mild concentric hypertrophy. Systolic function was normal. The estimated ejection fraction was in the range of 55% to 60%. Wall motion was normal; there were no regional wall motion abnormalities. The study was not technically sufficient to allow evaluation of LV diastolic dysfunction due to atrial fibrillation. - Aortic valve: Trileaflet; moderately thickened, moderately calcified leaflets. Valve mobility was restricted. Sclerosis without stenosis. There was moderate regurgitation. - Mitral valve: Calcified annulus. There was mild regurgitation directed posteriorly. - Left atrium: The atrium was mildly dilated. - Right ventricle: The cavity size was mildly dilated. Wall thickness was normal. Systolic function was mildly reduced. - Right atrium: The atrium was mildly dilated. Pacer wire or catheter noted in right atrium. - Tricuspid valve: There was moderate regurgitation.  Subjective: Went to the bedside this AM and patient was dressed and had  ripped out IV and blood was dripping on the floor. Tried to speak with the patient but did not want to speak to me, did not want to be examined, and refused blood work and care. Patient was of sound mind and after discussion about risks of leaving AMA patient proceeded to sign the paper and walked out the door.  Discharge Exam: Vitals:   01/24/17 0102 01/24/17 0926  BP: 128/76 134/80  Pulse: 76 70  Resp: 16 16  Temp: 98 F (36.7 C) 97.8 F (36.6 C)  SpO2: 100% 98%   Vitals:   01/23/17 1729 01/23/17 2102 01/24/17 0102 01/24/17 0926  BP: 128/63 125/70 128/76 134/80  Pulse: 70 83 76 70  Resp:  18 16 16   Temp: 97.6 F (36.4 C) 98.2 F (36.8 C) 98 F (36.7 C) 97.8 F (36.6 C)  TempSrc: Oral Oral Oral Oral  SpO2: 100% 99% 100% 98%  Weight:      Height:       PHYSICAL EXAM:  Would not let me examine her  The results of significant diagnostics from this hospitalization (including imaging, microbiology, ancillary and laboratory) are listed below for reference.    Microbiology: No results found for this or any previous visit (from the past 240 hour(s)).   Labs: BNP (last 3 results) No results for input(s): BNP in the last 8760 hours. Basic Metabolic Panel: Recent Labs  Lab 01/22/17 1827 01/22/17 1833 01/23/17 0832  NA 134* 139 136  K 3.7 3.8  3.2*  CL 101 104 104  CO2 21*  --  23  GLUCOSE 91 92 156*  BUN 22* 24* 16  CREATININE 1.50* 1.40* 1.22*  CALCIUM 8.7*  --  8.5*  MG  --   --  1.9  PHOS  --   --  2.3*   Liver Function Tests: Recent Labs  Lab 01/22/17 1827 01/23/17 0832  AST 27 29  ALT 17 16  ALKPHOS 54 50  BILITOT 1.9* 2.0*  PROT 6.7 6.0*  ALBUMIN 3.9 3.4*   No results for input(s): LIPASE, AMYLASE in the last 168 hours. No results for input(s): AMMONIA in the last 168 hours. CBC: Recent Labs  Lab 01/22/17 1827 01/22/17 1833 01/23/17 0832  WBC 6.3  --  6.0  NEUTROABS 3.5  --  3.2  HGB 13.2 12.6 12.2  HCT 37.3 37.0 35.4*  MCV 89.4  --  90.3   PLT 189  --  165   Cardiac Enzymes: No results for input(s): CKTOTAL, CKMB, CKMBINDEX, TROPONINI in the last 168 hours. BNP: Invalid input(s): POCBNP CBG: Recent Labs  Lab 01/23/17 2149 01/24/17 0635  GLUCAP 81 87   D-Dimer No results for input(s): DDIMER in the last 72 hours. Hgb A1c Recent Labs    01/23/17 0318  HGBA1C 5.1   Lipid Profile Recent Labs    01/23/17 0318  CHOL 242*  HDL 45  LDLCALC 184*  TRIG 63  CHOLHDL 5.4   Thyroid function studies No results for input(s): TSH, T4TOTAL, T3FREE, THYROIDAB in the last 72 hours.  Invalid input(s): FREET3 Anemia work up No results for input(s): VITAMINB12, FOLATE, FERRITIN, TIBC, IRON, RETICCTPCT in the last 72 hours. Urinalysis    Component Value Date/Time   COLORURINE YELLOW 01/22/2017 2141   APPEARANCEUR CLEAR 01/22/2017 2141   LABSPEC 1.009 01/22/2017 2141   PHURINE 7.0 01/22/2017 2141   GLUCOSEU NEGATIVE 01/22/2017 2141   GLUCOSEU NEGATIVE 09/01/2016 1524   HGBUR NEGATIVE 01/22/2017 2141   BILIRUBINUR NEGATIVE 01/22/2017 2141   BILIRUBINUR negative 09/16/2013 1358   KETONESUR 5 (A) 01/22/2017 2141   PROTEINUR NEGATIVE 01/22/2017 2141   UROBILINOGEN 0.2 09/01/2016 1524   NITRITE NEGATIVE 01/22/2017 2141   LEUKOCYTESUR NEGATIVE 01/22/2017 2141   Sepsis Labs Invalid input(s): PROCALCITONIN,  WBC,  LACTICIDVEN Microbiology No results found for this or any previous visit (from the past 240 hour(s)).  Time coordinating discharge: 25 minutes  SIGNED:  Kerney Elbe, DO [facility] 01/24/2017, 11:23 AM Pager [phone]  If 7PM-7AM, please contact night-coverage [email or web address] Password [email or web address]

## 2017-01-24 NOTE — Progress Notes (Signed)
Patient went AMA after being advised by MD, daughter and RN. Pt continued to shout and get upset. Pt got dressed continued to state she was leaving. Pt signed AMA form

## 2017-01-27 LAB — CUP PACEART REMOTE DEVICE CHECK
Battery Remaining Longevity: 57 mo
Battery Voltage: 2.96 V
Brady Statistic AP VP Percent: 0 %
Brady Statistic AP VS Percent: 0 %
Brady Statistic AS VP Percent: 99.91 %
Brady Statistic AS VS Percent: 0.09 %
Brady Statistic RA Percent Paced: 0 %
Brady Statistic RV Percent Paced: 99.94 %
Date Time Interrogation Session: 20181206083623
HighPow Impedance: 52 Ohm
HighPow Impedance: 88 Ohm
Implantable Lead Implant Date: 20080908
Implantable Lead Implant Date: 20100121
Implantable Lead Implant Date: 20100121
Implantable Lead Implant Date: 20130704
Implantable Lead Location: 753858
Implantable Lead Location: 753859
Implantable Lead Location: 753860
Implantable Lead Location: 753860
Implantable Lead Model: 4196
Implantable Lead Model: 5076
Implantable Lead Model: 5076
Implantable Lead Model: 7120
Implantable Pulse Generator Implant Date: 20171004
Lead Channel Impedance Value: 342 Ohm
Lead Channel Impedance Value: 456 Ohm
Lead Channel Impedance Value: 551 Ohm
Lead Channel Impedance Value: 551 Ohm
Lead Channel Impedance Value: 589 Ohm
Lead Channel Impedance Value: 988 Ohm
Lead Channel Pacing Threshold Amplitude: 0.75 V
Lead Channel Pacing Threshold Amplitude: 1.625 V
Lead Channel Pacing Threshold Pulse Width: 0.4 ms
Lead Channel Pacing Threshold Pulse Width: 0.8 ms
Lead Channel Sensing Intrinsic Amplitude: 2.125 mV
Lead Channel Sensing Intrinsic Amplitude: 2.125 mV
Lead Channel Setting Pacing Amplitude: 2 V
Lead Channel Setting Pacing Amplitude: 2.75 V
Lead Channel Setting Pacing Pulse Width: 0.4 ms
Lead Channel Setting Pacing Pulse Width: 0.8 ms
Lead Channel Setting Sensing Sensitivity: 0.3 mV

## 2017-01-30 ENCOUNTER — Other Ambulatory Visit: Payer: Self-pay | Admitting: Internal Medicine

## 2017-01-30 ENCOUNTER — Encounter: Payer: Self-pay | Admitting: Cardiology

## 2017-02-03 ENCOUNTER — Ambulatory Visit: Payer: Medicare Other

## 2017-02-06 ENCOUNTER — Ambulatory Visit: Payer: Medicare Other

## 2017-02-11 ENCOUNTER — Other Ambulatory Visit: Payer: Self-pay | Admitting: General Practice

## 2017-02-11 ENCOUNTER — Other Ambulatory Visit: Payer: Self-pay | Admitting: Internal Medicine

## 2017-02-11 MED ORDER — WARFARIN SODIUM 5 MG PO TABS: ORAL_TABLET | ORAL | 2 refills | Status: DC

## 2017-02-18 ENCOUNTER — Ambulatory Visit: Payer: Medicare Other | Admitting: Internal Medicine

## 2017-03-02 ENCOUNTER — Ambulatory Visit: Payer: Medicare Other

## 2017-03-03 ENCOUNTER — Ambulatory Visit (INDEPENDENT_AMBULATORY_CARE_PROVIDER_SITE_OTHER): Payer: Medicare Other | Admitting: General Practice

## 2017-03-03 DIAGNOSIS — Z7901 Long term (current) use of anticoagulants: Secondary | ICD-10-CM

## 2017-03-03 LAB — POCT INR: INR: 1.6

## 2017-03-03 NOTE — Patient Instructions (Addendum)
Pre visit review using our clinic review tool, if applicable. No additional management support is needed unless otherwise documented below in the visit note.  Take 1 1/2 tablets today (1/15) and tomorrow (1/16) and then continue to take 1 tablet all days.  Re-check in 3 weeks.

## 2017-03-10 ENCOUNTER — Ambulatory Visit: Payer: Medicare Other | Admitting: Internal Medicine

## 2017-03-17 ENCOUNTER — Ambulatory Visit: Payer: Medicare Other | Admitting: Internal Medicine

## 2017-03-17 ENCOUNTER — Encounter: Payer: Self-pay | Admitting: Internal Medicine

## 2017-03-17 VITALS — BP 125/80 | HR 73 | Ht 63.0 in | Wt 120.2 lb

## 2017-03-17 DIAGNOSIS — I429 Cardiomyopathy, unspecified: Secondary | ICD-10-CM | POA: Diagnosis not present

## 2017-03-17 DIAGNOSIS — R55 Syncope and collapse: Secondary | ICD-10-CM

## 2017-03-17 DIAGNOSIS — I4891 Unspecified atrial fibrillation: Secondary | ICD-10-CM | POA: Diagnosis not present

## 2017-03-17 DIAGNOSIS — Z79899 Other long term (current) drug therapy: Secondary | ICD-10-CM | POA: Diagnosis not present

## 2017-03-17 LAB — CUP PACEART INCLINIC DEVICE CHECK
Battery Remaining Longevity: 60 mo
Battery Voltage: 2.96 V
Brady Statistic AP VP Percent: 0 %
Brady Statistic RA Percent Paced: 0 %
HIGH POWER IMPEDANCE MEASURED VALUE: 48 Ohm
HighPow Impedance: 77 Ohm
Implantable Lead Implant Date: 20100121
Implantable Lead Implant Date: 20130704
Implantable Lead Location: 753860
Implantable Lead Location: 753860
Implantable Lead Model: 4196
Implantable Lead Model: 5076
Implantable Pulse Generator Implant Date: 20171004
Lead Channel Impedance Value: 304 Ohm
Lead Channel Impedance Value: 418 Ohm
Lead Channel Impedance Value: 874 Ohm
Lead Channel Pacing Threshold Amplitude: 0.75 V
Lead Channel Pacing Threshold Pulse Width: 0.4 ms
Lead Channel Pacing Threshold Pulse Width: 0.8 ms
Lead Channel Setting Pacing Amplitude: 2 V
Lead Channel Setting Pacing Amplitude: 2.5 V
Lead Channel Setting Pacing Pulse Width: 0.4 ms
Lead Channel Setting Pacing Pulse Width: 0.8 ms
Lead Channel Setting Sensing Sensitivity: 0.3 mV
MDC IDC LEAD IMPLANT DT: 20080908
MDC IDC LEAD IMPLANT DT: 20100121
MDC IDC LEAD LOCATION: 753858
MDC IDC LEAD LOCATION: 753859
MDC IDC MSMT LEADCHNL LV IMPEDANCE VALUE: 475 Ohm
MDC IDC MSMT LEADCHNL LV IMPEDANCE VALUE: 513 Ohm
MDC IDC MSMT LEADCHNL LV PACING THRESHOLD AMPLITUDE: 1.5 V
MDC IDC MSMT LEADCHNL RA IMPEDANCE VALUE: 513 Ohm
MDC IDC MSMT LEADCHNL RA SENSING INTR AMPL: 1.6 mV
MDC IDC SESS DTM: 20190129171432
MDC IDC STAT BRADY AP VS PERCENT: 0 %
MDC IDC STAT BRADY AS VP PERCENT: 99.91 %
MDC IDC STAT BRADY AS VS PERCENT: 0.09 %
MDC IDC STAT BRADY RV PERCENT PACED: 99.93 %

## 2017-03-17 NOTE — Patient Instructions (Addendum)
Medication Instructions:  Your physician recommends that you continue on your current medications as directed. Please refer to the Current Medication list given to you today.  Labwork: Your physician recommends that you have labs drawn today: BMP  Testing/Procedures: None ordered.  Follow-Up: Your physician recommends that you schedule a follow-up appointment in 6 months with Tommye Standard, PA  Remote monitoring is used to monitor your Pacemaker from home. This monitoring reduces the number of office visits required to check your device to one time per year. It allows [place] to keep an eye on the functioning of your device to ensure it is working properly. You are scheduled for a device check from home on 04/23/2017. You may send your transmission at any time that day. If you have a wireless device, the transmission will be sent automatically. After your physician reviews your transmission, you will receive a postcard with your next transmission date.   Any Other Special Instructions Will Be Listed Below (If Applicable).  Dr Caryl Comes recommends you wear an abdominal binder while you are up and ambulating. He also recommends you keep your head at 60 degrees.   If you need a refill on your cardiac medications before your next appointment, please call your pharmacy.

## 2017-03-17 NOTE — Progress Notes (Signed)
Patient Care Team: Biagio Borg, MD as PCP - General (Internal Medicine)   HPI  Michele [name] is a 84 y.o. 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   She underwent device generator replacement 3/18    Echo 2010 EF 55-60% improved from 25-30%  Echocardiogram 12/16 retained normal LV function   DATE TEST    2010 Echo    EF 25-30 %   12/16 Echo    EF 55-60 %   12/18 Echo  Ef 55-60%       Date Cr K Hgb  12/18 1.22 3.2 12.2          She was hospitalized 12/18 following a syncopal episode; CT neg   Orthostatic BP 128>>103   She apparently became combative agitated and signed out AMA  This was her second syncopal episode.  Both occurred with prolonged standing.  She has a history of orthostatic intolerance.  Typically the first couple of hours of her morning she is prone to being quite lightheaded.   Her husband died at the end of [id number]. She is doing relatively well.  Her children have been to visit.     Past Medical History:  Diagnosis Date  . Atrial fibrillation ([place])    NEG STRESS CARDIOLITE  . C. difficile colitis   . Cardiomyopathy    RATE RELATED  . CHF (congestive heart failure) ([place])   . Diverticulitis    PMH of X 2  . Diverticulosis    DIVERTICULITIS  . Gilbert's syndrome   . Hx of colonic polyp 2011   Dr Fuller Plan  . Hyperlipidemia    NMR LIOPROFILE LDL 234(3206/2234)HDL 37,TG86  . Renal insufficiency 12/09/2015  . Thyroid disease    HYPOTHYROIDISM...THYROID NODULES    Past Surgical History:  Procedure Laterality Date  . ABDOMINAL HYSTERECTOMY  1973  . APPENDECTOMY  1956  . BIOPSY THYROID     SINGLE NODULE  . BREAST BIOPSY  1997  . CARDIAC DEFIBRILLATOR PLACEMENT    . CHOLECYSTECTOMY  1972  . colonoscopy with polypectomy  2011  . EP IMPLANTABLE DEVICE N/A 11/21/2015   Procedure: BIV ICD Generator Changeout;  Surgeon: Deboraha Sprang, MD;  Location: [facility];  Service: Cardiovascular;  Laterality: N/A;  . ICD....02/2008    . LEAD REVISION N/A 08/21/2011   Procedure: LEAD REVISION;  Surgeon: Deboraha Sprang, MD;  Location: [facility] C[facility];  Service: Cardiovascular;  Laterality: N/A;  . PACEMAKER PLACEMENT     10/2006  . ROTATOR CUFF REPAIR     x2  . THROIDECTOMY 12/08     benign    Current Outpatient Medications  Medication Sig Dispense Refill  . cholecalciferol (VITAMIN D) 1000 UNITS tablet Take 1,000 Units by mouth daily.    Marland Kitchen co-enzyme Q-10 30 MG capsule Take 30 mg by mouth daily.    . furosemide (LASIX) 20 MG tablet TAKE 1 TABLET (20 MG TOTAL) BY MOUTH AS NEEDED FOR FLUID. 30 tablet 2  . levothyroxine (SYNTHROID, LEVOTHROID) 50 MCG tablet Take 1 tablet (50 mcg total) by mouth daily. 90 tablet 3  . warfarin (COUMADIN) 5 MG tablet TAKE AS DIRECTED. 35 tablet 2   No current facility-administered medications for this visit.     Allergies  Allergen Reactions  . Cephalexin Hives  . Erythromycin Swelling and Other (See Comments)    REACTION:  swollen and sore mouth  . Terbinafine Hcl     Rash   . Colchicine Other (See Comments)    REACTION: violent diarrhea  . Crestor [Rosuvastatin Calcium] Other (See Comments)    Arm pain  . Rosuvastatin Other (See Comments)    REACTION: upper arm pain bilaterally  . Vytorin [Ezetimibe-Simvastatin] Other (See Comments)    Nightmares with generic    Review of Systems negative except from HPI and PMH  Physical Exam BP 125/80   Pulse 73   Ht 5\' 3"  (1.6 m)   Wt 120 lb 4 oz (54.5 kg)   BMI 21.30 kg/m  Well developed and nourished in no acute distress HENT normal Neck supple with JVP-flat Clear Regular rate and rhythm, no murmurs or gallops Abd-soft with active BS No Clubbing cyanosis edema Skin-warm and dry A & Oriented  Grossly normal sensory and motor function  ECG demonstrates atrial  fibrillation with ventricular pacing at 72 QRS morphology upright lead V1 and q. R in lead I QRS duration 126  Assessment and  Plan Atrial fibrillation-permanent  Complete heart block  S/p AV ablation  Nonischemic cardiomyopathy -resolved  Hypokalemia  Orthostatic hypotension  Implantable defibrillator-biventricular-[employer]onic The patient's device was interrogated.  The information was reviewed. No changes were made in the programming.      On Anticoagulation;  No bleeding issues   Euvolemic continue current meds  We reviewed the physiology of orthostatic intolerance.  I have suggested that we attempt the use of nonpharmacological therapies so as to minimize the likelihood of drug effects.  I suggested she raise the head of her bed 6 inches as well as obtain an abdominal binder.  We will check a metabolic profile as her potassium at hospital discharge was low.  We spent more than 50% of our >25 min visit in face to face counseling regarding the above

## 2017-03-18 LAB — BASIC METABOLIC PANEL
BUN / CREAT RATIO: 18 (ref 12–28)
BUN: 22 mg/dL (ref 8–27)
CHLORIDE: 97 mmol/L (ref 96–106)
CO2: 23 mmol/L (ref 20–29)
Calcium: 9.5 mg/dL (ref 8.7–10.3)
Creatinine, Ser: 1.22 mg/dL — ABNORMAL HIGH (ref 0.57–1.00)
GFR calc non Af Amer: 40 mL/min/{1.73_m2} — ABNORMAL LOW (ref >59)
GFR, EST AFRICAN AMERICAN: 47 mL/min/{1.73_m2} — AB (ref >59)
Glucose: 91 mg/dL (ref 65–99)
POTASSIUM: 4.4 mmol/L (ref 3.5–5.2)
SODIUM: 137 mmol/L (ref 134–144)

## 2017-03-24 ENCOUNTER — Ambulatory Visit (INDEPENDENT_AMBULATORY_CARE_PROVIDER_SITE_OTHER): Payer: Medicare Other | Admitting: General Practice

## 2017-03-24 DIAGNOSIS — Z5181 Encounter for therapeutic drug level monitoring: Secondary | ICD-10-CM

## 2017-03-24 DIAGNOSIS — I4891 Unspecified atrial fibrillation: Secondary | ICD-10-CM

## 2017-03-24 LAB — POCT INR: INR: 2.1

## 2017-03-24 NOTE — Patient Instructions (Addendum)
Pre visit review using our clinic review tool, if applicable. No additional management support is needed unless otherwise documented below in the visit note.  Continue to take 1 tablet all days.  Re-check in 4 weeks.  

## 2017-04-21 ENCOUNTER — Ambulatory Visit: Payer: Medicare Other

## 2017-04-21 ENCOUNTER — Ambulatory Visit: Payer: Self-pay

## 2017-04-21 NOTE — Telephone Encounter (Signed)
Pt. Did not want to speak to triage.

## 2017-04-23 ENCOUNTER — Ambulatory Visit (INDEPENDENT_AMBULATORY_CARE_PROVIDER_SITE_OTHER): Payer: Medicare Other | Admitting: *Deleted

## 2017-04-23 DIAGNOSIS — I429 Cardiomyopathy, unspecified: Secondary | ICD-10-CM

## 2017-04-23 NOTE — Progress Notes (Signed)
Remote ICD transmission.   

## 2017-04-24 ENCOUNTER — Encounter: Payer: Self-pay | Admitting: Cardiology

## 2017-04-24 ENCOUNTER — Telehealth: Payer: Self-pay | Admitting: Internal Medicine

## 2017-04-24 NOTE — Progress Notes (Signed)
Letter  

## 2017-04-24 NOTE — Telephone Encounter (Signed)
New Message    Michele [name] is calling from [facility] needing to know if the patient is diagnosis with heart failure as well as the latest ejection fraction.   Doc phrase does not work.

## 2017-04-28 ENCOUNTER — Ambulatory Visit (INDEPENDENT_AMBULATORY_CARE_PROVIDER_SITE_OTHER): Payer: Medicare Other | Admitting: General Practice

## 2017-04-28 DIAGNOSIS — I4891 Unspecified atrial fibrillation: Secondary | ICD-10-CM

## 2017-04-28 DIAGNOSIS — Z5181 Encounter for therapeutic drug level monitoring: Secondary | ICD-10-CM | POA: Diagnosis not present

## 2017-04-28 LAB — POCT INR: INR: 2

## 2017-04-28 NOTE — Patient Instructions (Addendum)
Pre visit review using our clinic review tool, if applicable. No additional management support is needed unless otherwise documented below in the visit note.  Continue to take 1 tablet all days.  Re-check in 4 weeks.  

## 2017-04-30 DIAGNOSIS — R809 Proteinuria, unspecified: Secondary | ICD-10-CM | POA: Diagnosis not present

## 2017-04-30 DIAGNOSIS — I4891 Unspecified atrial fibrillation: Secondary | ICD-10-CM | POA: Diagnosis not present

## 2017-04-30 DIAGNOSIS — N183 Chronic kidney disease, stage 3 (moderate): Secondary | ICD-10-CM | POA: Diagnosis not present

## 2017-04-30 DIAGNOSIS — I951 Orthostatic hypotension: Secondary | ICD-10-CM | POA: Diagnosis not present

## 2017-04-30 DIAGNOSIS — I509 Heart failure, unspecified: Secondary | ICD-10-CM | POA: Diagnosis not present

## 2017-05-02 LAB — CUP PACEART REMOTE DEVICE CHECK
Battery Remaining Longevity: 56 mo
Brady Statistic AP VS Percent: 0 %
Brady Statistic AS VS Percent: 0.34 %
Brady Statistic RA Percent Paced: 0 %
Brady Statistic RV Percent Paced: 99.75 %
HighPow Impedance: 49 Ohm
HighPow Impedance: 79 Ohm
Implantable Lead Implant Date: 20100121
Implantable Lead Implant Date: 20130704
Implantable Lead Location: 753859
Implantable Lead Location: 753860
Implantable Lead Location: 753860
Implantable Lead Model: 4196
Implantable Lead Model: 7120
Implantable Pulse Generator Implant Date: 20171004
Lead Channel Impedance Value: 342 Ohm
Lead Channel Impedance Value: 456 Ohm
Lead Channel Impedance Value: 513 Ohm
Lead Channel Pacing Threshold Amplitude: 0.75 V
Lead Channel Pacing Threshold Amplitude: 1.5 V
Lead Channel Pacing Threshold Pulse Width: 0.8 ms
Lead Channel Sensing Intrinsic Amplitude: 2.25 mV
Lead Channel Setting Pacing Amplitude: 2.5 V
Lead Channel Setting Pacing Pulse Width: 0.4 ms
Lead Channel Setting Pacing Pulse Width: 0.8 ms
Lead Channel Setting Sensing Sensitivity: 0.3 mV
MDC IDC LEAD IMPLANT DT: 20080908
MDC IDC LEAD IMPLANT DT: 20100121
MDC IDC LEAD LOCATION: 753858
MDC IDC MSMT BATTERY VOLTAGE: 2.96 V
MDC IDC MSMT LEADCHNL LV IMPEDANCE VALUE: 589 Ohm
MDC IDC MSMT LEADCHNL LV IMPEDANCE VALUE: 950 Ohm
MDC IDC MSMT LEADCHNL RA IMPEDANCE VALUE: 551 Ohm
MDC IDC MSMT LEADCHNL RA SENSING INTR AMPL: 2.25 mV
MDC IDC MSMT LEADCHNL RV PACING THRESHOLD PULSEWIDTH: 0.4 ms
MDC IDC SESS DTM: 20190307102605
MDC IDC SET LEADCHNL RV PACING AMPLITUDE: 2 V
MDC IDC STAT BRADY AP VP PERCENT: 0 %
MDC IDC STAT BRADY AS VP PERCENT: 99.66 %

## 2017-05-13 ENCOUNTER — Ambulatory Visit (INDEPENDENT_AMBULATORY_CARE_PROVIDER_SITE_OTHER): Payer: Medicare Other | Admitting: *Deleted

## 2017-05-13 VITALS — BP 132/85 | HR 79 | Resp 18 | Ht 63.0 in | Wt 120.0 lb

## 2017-05-13 DIAGNOSIS — N183 Chronic kidney disease, stage 3 unspecified: Secondary | ICD-10-CM

## 2017-05-13 DIAGNOSIS — Z0000 Encounter for general adult medical examination without abnormal findings: Secondary | ICD-10-CM | POA: Diagnosis not present

## 2017-05-13 NOTE — Progress Notes (Addendum)
Subjective:   Michele [name] is a 84 y.o. female who presents for Medicare Annual (Subsequent) preventive examination.  Review of Systems:  No ROS.  Medicare Wellness Visit. Additional risk factors are reflected in the social history.  Cardiac Risk Factors include: advanced age (>71men, >8 women);dyslipidemia Sleep patterns: feels rested on waking, gets up 1-2 times nightly to void and sleeps 7hours nightly.    Home Safety/Smoke Alarms: Feels safe in home. Smoke alarms in place.  Living environment; residence and Firearm Safety: 2-story house, no firearms, Lives alone, no needs for DME, limited support system. Seat Belt Safety/Bike Helmet: Wears seat belt.     Objective:     Vitals: BP 132/85   Pulse 79   Resp 18   Ht 5\' 3"  (1.6 m)   Wt 120 lb (54.4 kg)   SpO2 98%   BMI 21.26 kg/m   Body mass index is 21.26 kg/m.  Advanced Directives 05/13/2017 01/22/2017 05/27/2016 05/26/2016 11/21/2015 11/17/2014 09/06/2014  Does Patient Have a Medical Advance Directive? No No No No Yes Yes No  Type of Advance Directive - - - - Living will - -  Would patient like information on creating a medical advance directive? Yes (ED - Information included in AVS) No - Patient declined No - Patient declined No - Patient declined - - -  Pre-existing out of facility DNR order (yellow form or pink MOST form) - - - - - - -    Tobacco Social History   Tobacco Use  Smoking Status Never Smoker  Smokeless Tobacco Never Used     Counseling given: Not Answered  Past Medical History:  Diagnosis Date  . Atrial fibrillation ([place])    NEG STRESS CARDIOLITE  . C. difficile colitis   . Cardiomyopathy    RATE RELATED  . CHF (congestive heart failure) ([place])   . Diverticulitis    PMH of X 2  . Diverticulosis    DIVERTICULITIS  . Gilbert's syndrome   . Hx of colonic polyp 2011   Dr Fuller Plan  . Hyperlipidemia    NMR LIOPROFILE LDL 234(3206/2234)HDL 37,TG86  . Renal insufficiency 12/09/2015  . Thyroid  disease    HYPOTHYROIDISM...THYROID NODULES   Past Surgical History:  Procedure Laterality Date  . ABDOMINAL HYSTERECTOMY  1973  . APPENDECTOMY  1956  . BIOPSY THYROID     SINGLE NODULE  . BREAST BIOPSY  1997  . CARDIAC DEFIBRILLATOR PLACEMENT    . CHOLECYSTECTOMY  1972  . colonoscopy with polypectomy  2011  . EP IMPLANTABLE DEVICE N/A 11/21/2015   Procedure: BIV ICD Generator Changeout;  Surgeon: Deboraha Sprang, MD;  Location: [facility];  Service: Cardiovascular;  Laterality: N/A;  . ICD....02/2008    . LEAD REVISION N/A 08/21/2011   Procedure: LEAD REVISION;  Surgeon: Deboraha Sprang, MD;  Location: [facility];  Service: Cardiovascular;  Laterality: N/A;  . PACEMAKER PLACEMENT     10/2006  . ROTATOR CUFF REPAIR     x2  . THROIDECTOMY 12/08     benign   Family History  Problem Relation Age of Onset  . Heart attack Mother 3  . Breast cancer Sister   . Heart attack Brother 61  . Lung disease Father        Silicosis  . Vasculitis Sister        Giant Cell Arteritis  . Ulcerative colitis Son    Social History   Socioeconomic History  . Marital status: Widowed  Spouse name: Not on file  . Number of children: Not on file  . Years of education: Not on file  . Highest education level: Not on file  Occupational History  . 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 Financial resource strain: Not hard at all  . Food insecurity:    Worry: Never true    Inability: Never true  . Transportation needs:    Medical: No    Non-medical: No  Tobacco Use  . Smoking status: Never Smoker  . Smokeless tobacco: Never Used  Substance and Sexual Activity  . Alcohol use: No  . Drug use: No  . Sexual activity: Not Currently  Lifestyle  . Physical activity:    Days per week: 3 days    Minutes per session: 90 min  . Stress: Only a little  Relationships  . Social connections:    Talks on phone: More than three times a week    Gets  together: More than three times a week    Attends religious service: Not on file    Active member of club or organization: Not on file    Attends meetings of clubs or organizations: Not on file    Relationship status: Widowed  Other Topics Concern  . Not on file  Social History Narrative   PT. MOVED HERE FROM [place] OVER 25 YEARS AGO WITH HUSBAND.she WAS WITH [employer] GEIGY.    Outpatient Encounter Medications as of 05/13/2017  Medication Sig  . cholecalciferol (VITAMIN D) 1000 UNITS tablet Take 1,000 Units by mouth daily.  Marland Kitchen co-enzyme Q-10 30 MG capsule Take 30 mg by mouth daily.  . furosemide (LASIX) 20 MG tablet TAKE 1 TABLET (20 MG TOTAL) BY MOUTH AS NEEDED FOR FLUID.  Marland Kitchen levothyroxine (SYNTHROID, LEVOTHROID) 50 MCG tablet Take 1 tablet (50 mcg total) by mouth daily.  Marland Kitchen warfarin (COUMADIN) 5 MG tablet TAKE AS DIRECTED.   No facility-administered encounter medications on file as of 05/13/2017.     Activities of Daily Living In your present state of health, do you have any difficulty performing the following activities: 05/13/2017 01/23/2017  Hearing? N -  Vision? N -  Difficulty concentrating or making decisions? N -  Walking or climbing stairs? N -  Dressing or bathing? N -  Doing errands, shopping? N Y  Conservation officer, nature and eating ? N -  Using the Toilet? N -  In the past six months, have you accidently leaked urine? N -  Do you have problems with loss of bowel control? N -  Managing your Medications? N -  Managing your Finances? Y -  Housekeeping or managing your Housekeeping? N -  Some recent data might be hidden    Patient Care Team: Biagio Borg, MD as PCP - General (Internal Medicine) Calvert Cantor, MD as Consulting Physician (Ophthalmology) Deboraha Sprang, MD as Consulting Physician (Cardiology) Dian Queen, MD as Consulting Physician (Obstetrics and Gynecology)    Assessment:   This is a routine wellness examination for [facility]. Physical assessment deferred to  PCP.   Exercise Activities and Dietary recommendations Current Exercise Habits: Structured exercise class, Type of exercise: treadmill(swims 2-3 weekly), Time (Minutes): > 60, Frequency (Times/Week): 3, Weekly Exercise (Minutes/Week): 0, Intensity: Mild, Exercise limited by: None identified  Diet (meal preparation, eat out, water intake, caffeinated beverages, dairy products, fruits and vegetables): in general, a "healthy" diet  , well balanced, states she follows a renal diet.  Reviewed heart healthy diet, Discussed renal diet, Diet education was attached to patient's AVS. Relevant patient education assigned to patient using Emmi.   Goals    . Patient Stated     I want to plan to go to [place] by myself and stay for about a month. Stay as healthy and as independent as possible.       Fall Risk Fall Risk  05/13/2017 09/03/2016 07/13/2015 05/16/2014 01/04/2013  Falls in the past year? Yes No No No No  Comment has had spells of passing out - - - -  Number falls in past yr: 2 or more - - - -  Injury with Fall? Yes - - - -  Risk Factor Category  High Fall Risk - - - -  Follow up Falls prevention discussed;Education provided - - - -    Depression Screen PHQ 2/9 Scores 05/13/2017 09/03/2016 07/13/2015 05/16/2014  PHQ - 2 Score 2 0 1 0  PHQ- 9 Score 3 - - -     Cognitive Function MMSE - Mini Mental State Exam 05/13/2017  Orientation to time 5  Orientation to Place 5  Registration 3  Attention/ Calculation 4  Recall 2  Language- name 2 objects 2  Language- repeat 1  Language- follow 3 step command 3  Language- read & follow direction 1  Write a sentence 1  Copy design 1  Total score 28          Immunization History  Administered Date(s) Administered  . Influenza Split 12/17/2010, 11/19/2011  . Influenza Whole 11/28/2004, 12/02/2006, 11/30/2008, 11/02/2009  . Influenza, High Dose Seasonal PF 10/21/2016  . Influenza,inj,Quad PF,6+ Mos 12/01/2012, 11/08/2013, 11/22/2014  .  Pneumococcal Conjugate-13 11/22/2014  . Pneumococcal Polysaccharide-23 12/10/2006  . Tdap 02/25/2011   Screening Tests Health Maintenance  Topic Date Due  . TETANUS/TDAP  02/24/2021  . INFLUENZA VACCINE  Completed  . DEXA SCAN  Completed  . PNA vac Low Risk Adult  Completed      Plan:     Dietician referral placed to educate patient regarding diet and chronic kidney disease.  Continue doing brain stimulating activities (puzzles, reading, adult coloring books, staying active) to keep memory sharp.   Continue to eat heart healthy diet (full of fruits, vegetables, whole grains, lean protein, water--limit salt, fat, and sugar intake) and increase physical activity as tolerated.  I have personally reviewed and noted the following in the patient's chart:   . Medical and social history . Use of alcohol, tobacco or illicit drugs  . Current medications and supplements . Functional ability and status . Nutritional status . Physical activity . Advanced directives . List of other physicians . Vitals . Screenings to include cognitive, depression, and falls . Referrals and appointments  In addition, I have reviewed and discussed with patient certain preventive protocols, quality metrics, and best practice recommendations. A written personalized care plan for preventive services as well as general preventive health recommendations were provided to patient.     Michiel Cowboy, RN  05/13/2017   Medical screening examination/treatment/procedure(s) were performed by non-physician practitioner and as supervising physician I was immediately available for consultation/collaboration. I agree with above. Cathlean Cower, MD

## 2017-05-13 NOTE — Patient Instructions (Addendum)
Continue doing brain stimulating activities (puzzles, reading, adult coloring books, staying active) to keep memory sharp.   Continue to eat heart healthy diet (full of fruits, vegetables, whole grains, lean protein, water--limit salt, fat, and sugar intake) and increase physical activity as tolerated.  Michele [name] , Thank you for taking time to come for your Medicare Wellness Visit. I appreciate your ongoing commitment to your health goals. Please review the following plan we discussed and let me know if I can assist you in the future.   These are the goals we discussed: Goals    . Patient Stated     I want to plan to go to [place] by myself and stay for about a month. Stay as healthy and as independent as possible.       This is a list of the screening recommended for you and due dates:  Health Maintenance  Topic Date Due  . Tetanus Vaccine  02/24/2021  . Flu Shot  Completed  . DEXA scan (bone density measurement)  Completed  . Pneumonia vaccines  Completed     Fall Prevention in the Home Falls can cause injuries. They can happen to people of all ages. There are many things you can do to make your home safe and to help prevent falls. What can I do on the outside of my home?  Regularly fix the edges of walkways and driveways and fix any cracks.  Remove anything that might make you trip as you walk through a door, such as a raised step or threshold.  Trim any bushes or trees on the path to your home.  Use bright outdoor lighting.  Clear any walking paths of anything that might make someone trip, such as rocks or tools.  Regularly check to see if handrails are loose or broken. Make sure that both sides of any steps have handrails.  Any raised decks and porches should have guardrails on the edges.  Have any leaves, snow, or ice cleared regularly.  Use sand or salt on walking paths during winter.  Clean up any spills in your garage right away. This includes oil or grease  spills. What can I do in the bathroom?  Use night lights.  Install grab bars by the toilet and in the tub and shower. Do not use towel bars as grab bars.  Use non-skid mats or decals in the tub or shower.  If you need to sit down in the shower, use a plastic, non-slip stool.  Keep the floor dry. Clean up any water that spills on the floor as soon as it happens.  Remove soap buildup in the tub or shower regularly.  Attach bath mats securely with double-sided non-slip rug tape.  Do not have throw rugs and other things on the floor that can make you trip. What can I do in the bedroom?  Use night lights.  Make sure that you have a light by your bed that is easy to reach.  Do not use any sheets or blankets that are too big for your bed. They should not hang down onto the floor.  Have a firm chair that has side arms. You can use this for support while you get dressed.  Do not have throw rugs and other things on the floor that can make you trip. What can I do in the kitchen?  Clean up any spills right away.  Avoid walking on wet floors.  Keep items that you use a lot in easy-to-reach places.  If you need to reach something above you, use a strong step stool that has a grab bar.  Keep electrical cords out of the way.  Do not use floor polish or wax that makes floors slippery. If you must use wax, use non-skid floor wax.  Do not have throw rugs and other things on the floor that can make you trip. What can I do with my stairs?  Do not leave any items on the stairs.  Make sure that there are handrails on both sides of the stairs and use them. Fix handrails that are broken or loose. Make sure that handrails are as long as the stairways.  Check any carpeting to make sure that it is firmly attached to the stairs. Fix any carpet that is loose or worn.  Avoid having throw rugs at the top or bottom of the stairs. If you do have throw rugs, attach them to the floor with carpet  tape.  Make sure that you have a light switch at the top of the stairs and the bottom of the stairs. If you do not have them, ask someone to add them for you. What else can I do to help prevent falls?  Wear shoes that: ? Do not have high heels. ? Have rubber bottoms. ? Are comfortable and fit you well. ? Are closed at the toe. Do not wear sandals.  If you use a stepladder: ? Make sure that it is fully opened. Do not climb a closed stepladder. ? Make sure that both sides of the stepladder are locked into place. ? Ask someone to hold it for you, if possible.  Clearly mark and make sure that you can see: ? Any grab bars or handrails. ? First and last steps. ? Where the edge of each step is.  Use tools that help you move around (mobility aids) if they are needed. These include: ? Canes. ? Walkers. ? Scooters. ? Crutches.  Turn on the lights when you go into a dark area. Replace any light bulbs as soon as they burn out.  Set up your furniture so you have a clear path. Avoid moving your furniture around.  If any of your floors are uneven, fix them.  If there are any pets around you, be aware of where they are.  Review your medicines with your doctor. Some medicines can make you feel dizzy. This can increase your chance of falling. Ask your doctor what other things that you can do to help prevent falls. This information is not intended to replace advice given to you by your health care provider. Make sure you discuss any questions you have with your health care provider. Document Released: 11/30/2008 Document Revised: 07/12/2015 Document Reviewed: 03/10/2014 Elsevier Interactive Patient Education  2018 [place] Maintenance, Female Adopting a healthy lifestyle and getting preventive care can go a long way to promote health and wellness. Talk with your health care provider about what schedule of regular examinations is right for you. This is a good chance for you to  check in with your provider about disease prevention and staying healthy. In between checkups, there are plenty of things you can do on your own. Experts have done a lot of research about which lifestyle changes and preventive measures are most likely to keep you healthy. Ask your health care provider for more information. Weight and diet Eat a healthy diet  Be sure to include plenty of vegetables, fruits, low-fat dairy products, and lean protein.  Do not eat a lot of foods high in solid fats, added sugars, or salt.  Get regular exercise. This is one of the most important things you can do for your health. ? Most adults should exercise for at least 150 minutes each week. The exercise should increase your heart rate and make you sweat (moderate-intensity exercise). ? Most adults should also do strengthening exercises at least twice a week. This is in addition to the moderate-intensity exercise.  Maintain a healthy weight  Body mass index (BMI) is a measurement that can be used to identify possible weight problems. It estimates body fat based on height and weight. Your health care provider can help determine your BMI and help you achieve or maintain a healthy weight.  For females 61 years of age and older: ? A BMI below 18.5 is considered underweight. ? A BMI of 18.5 to 24.9 is normal. ? A BMI of 25 to 29.9 is considered overweight. ? A BMI of 30 and above is considered obese.  Watch levels of cholesterol and blood lipids  You should start having your blood tested for lipids and cholesterol at 82 years of age, then have this test every 5 years.  You may need to have your cholesterol levels checked more often if: ? Your lipid or cholesterol levels are high. ? You are older than 82 years of age. ? You are at high risk for heart disease.  Cancer screening Lung Cancer  Lung cancer screening is recommended for adults 20-82 years old who are at high risk for lung cancer because of a  history of smoking.  A yearly low-dose CT scan of the lungs is recommended for people who: ? Currently smoke. ? Have quit within the past 15 years. ? Have at least a 30-pack-year history of smoking. A pack year is smoking an average of one pack of cigarettes a day for 1 year.  Yearly screening should continue until it has been 15 years since you quit.  Yearly screening should stop if you develop a health problem that would prevent you from having lung cancer treatment.  Breast Cancer  Practice breast self-awareness. This means understanding how your breasts normally appear and feel.  It also means doing regular breast self-exams. Let your health care provider know about any changes, no matter how small.  If you are in your 20s or 30s, you should have a clinical breast exam (CBE) by a health care provider every 1-3 years as part of a regular health exam.  If you are 27 or older, have a CBE every year. Also consider having a breast X-ray (mammogram) every year.  If you have a family history of breast cancer, talk to your health care provider about genetic screening.  If you are at high risk for breast cancer, talk to your health care provider about having an MRI and a mammogram every year.  Breast cancer gene (BRCA) assessment is recommended for women who have family members with BRCA-related cancers. BRCA-related cancers include: ? Breast. ? Ovarian. ? Tubal. ? Peritoneal cancers.  Results of the assessment will determine the need for genetic counseling and BRCA1 and BRCA2 testing.  Cervical Cancer Your health care provider may recommend that you be screened regularly for cancer of the pelvic organs (ovaries, uterus, and vagina). This screening involves a pelvic examination, including checking for microscopic changes to the surface of your cervix (Pap test). You may be encouraged to have this screening done every 3 years, beginning at age  56.  For women ages 12-65, health care  providers may recommend pelvic exams and Pap testing every 3 years, or they may recommend the Pap and pelvic exam, combined with testing for human papilloma virus (HPV), every 5 years. Some types of HPV increase your risk of cervical cancer. Testing for HPV may also be done on women of any age with unclear Pap test results.  Other health care providers may not recommend any screening for nonpregnant women who are considered low risk for pelvic cancer and who do not have symptoms. Ask your health care provider if a screening pelvic exam is right for you.  If you have had past treatment for cervical cancer or a condition that could lead to cancer, you need Pap tests and screening for cancer for at least 20 years after your treatment. If Pap tests have been discontinued, your risk factors (such as having a new sexual partner) need to be reassessed to determine if screening should resume. Some women have medical problems that increase the chance of getting cervical cancer. In these cases, your health care provider may recommend more frequent screening and Pap tests.  Colorectal Cancer  This type of cancer can be detected and often prevented.  Routine colorectal cancer screening usually begins at 82 years of age and continues through 82 years of age.  Your health care provider may recommend screening at an earlier age if you have risk factors for colon cancer.  Your health care provider may also recommend using home test kits to check for hidden blood in the stool.  A small camera at the end of a tube can be used to examine your colon directly (sigmoidoscopy or colonoscopy). This is done to check for the earliest forms of colorectal cancer.  Routine screening usually begins at age 28.  Direct examination of the colon should be repeated every 5-10 years through 82 years of age. However, you may need to be screened more often if early forms of precancerous polyps or small growths are found.  Skin  Cancer  Check your skin from head to toe regularly.  Tell your health care provider about any new moles or changes in moles, especially if there is a change in a mole's shape or color.  Also tell your health care provider if you have a mole that is larger than the size of a pencil eraser.  Always use sunscreen. Apply sunscreen liberally and repeatedly throughout the day.  Protect yourself by wearing long sleeves, pants, a wide-brimmed hat, and sunglasses whenever you are outside.  Heart disease, diabetes, and high blood pressure  High blood pressure causes heart disease and increases the risk of stroke. High blood pressure is more likely to develop in: ? People who have blood pressure in the high end of the normal range (130-139/85-89 mm Hg). ? People who are overweight or obese. ? People who are African American.  If you are 36-52 years of age, have your blood pressure checked every 3-5 years. If you are 5 years of age or older, have your blood pressure checked every year. You should have your blood pressure measured twice-once when you are at a hospital or clinic, and once when you are not at a hospital or clinic. Record the average of the two measurements. To check your blood pressure when you are not at a hospital or clinic, you can use: ? An automated blood pressure machine at a pharmacy. ? A home blood pressure monitor.  If you are between  86 years and 44 years old, ask your health care provider if you should take aspirin to prevent strokes.  Have regular diabetes screenings. This involves taking a blood sample to check your fasting blood sugar level. ? If you are at a normal weight and have a low risk for diabetes, have this test once every three years after 82 years of age. ? If you are overweight and have a high risk for diabetes, consider being tested at a younger age or more often. Preventing infection Hepatitis B  If you have a higher risk for hepatitis B, you should be  screened for this virus. You are considered at high risk for hepatitis B if: ? You were born in a country where hepatitis B is common. Ask your health care provider which countries are considered high risk. ? Your parents were born in a high-risk country, and you have not been immunized against hepatitis B (hepatitis B vaccine). ? You have HIV or AIDS. ? You use needles to inject street drugs. ? You live with someone who has hepatitis B. ? You have had sex with someone who has hepatitis B. ? You get hemodialysis treatment. ? You take certain medicines for conditions, including cancer, organ transplantation, and autoimmune conditions.  Hepatitis C  Blood testing is recommended for: ? Everyone born from [birth year] through 1963. ? Anyone with known risk factors for hepatitis C.  Sexually transmitted infections (STIs)  You should be screened for sexually transmitted infections (STIs) including gonorrhea and chlamydia if: ? You are sexually active and are younger than 82 years of age. ? You are older than 82 years of age and your health care provider tells you that you are at risk for this type of infection. ? Your sexual activity has changed since you were last screened and you are at an increased risk for chlamydia or gonorrhea. Ask your health care provider if you are at risk.  If you do not have HIV, but are at risk, it may be recommended that you take a prescription medicine daily to prevent HIV infection. This is called pre-exposure prophylaxis (PrEP). You are considered at risk if: ? You are sexually active and do not regularly use condoms or know the HIV status of your partner(s). ? You take drugs by injection. ? You are sexually active with a partner who has HIV.  Talk with your health care provider about whether you are at high risk of being infected with HIV. If you choose to begin PrEP, you should first be tested for HIV. You should then be tested every 3 months for as long as you are  taking PrEP. Pregnancy  If you are premenopausal and you may become pregnant, ask your health care provider about preconception counseling.  If you may become pregnant, take 400 to 800 micrograms (mcg) of folic acid every day.  If you want to prevent pregnancy, talk to your health care provider about birth control (contraception). Osteoporosis and menopause  Osteoporosis is a disease in which the bones lose minerals and strength with aging. This can result in serious bone fractures. Your risk for osteoporosis can be identified using a bone density scan.  If you are 59 years of age or older, or if you are at risk for osteoporosis and fractures, ask your health care provider if you should be screened.  Ask your health care provider whether you should take a calcium or vitamin D supplement to lower your risk for osteoporosis.  Menopause may have  certain physical symptoms and risks.  Hormone replacement therapy may reduce some of these symptoms and risks. Talk to your health care provider about whether hormone replacement therapy is right for you. Follow these instructions at home:  Schedule regular health, dental, and eye exams.  Stay current with your immunizations.  Do not use any tobacco products including cigarettes, chewing tobacco, or electronic cigarettes.  If you are pregnant, do not drink alcohol.  If you are breastfeeding, limit how much and how often you drink alcohol.  Limit alcohol intake to no more than 1 drink per day for nonpregnant women. One drink equals 12 ounces of beer, 5 ounces of wine, or 1 ounces of hard liquor.  Do not use street drugs.  Do not share needles.  Ask your health care provider for help if you need support or information about quitting drugs.  Tell your health care provider if you often feel depressed.  Tell your health care provider if you have ever been abused or do not feel safe at home. This information is not intended to replace  advice given to you by your health care provider. Make sure you discuss any questions you have with your health care provider. Document Released: 08/19/2010 Document Revised: 07/12/2015 Document Reviewed: 11/07/2014 Elsevier Interactive Patient Education  2018 [place] for Chronic Kidney Disease When your kidneys are not working well, they cannot remove waste and excess substances from your blood as effectively as they did before. This can lead to a buildup and imbalance of these substances, which can worsen kidney damage and affect how your body functions. Certain foods lead to a buildup of these substances in the body. By changing your diet as recommended by your diet and nutrition specialist (dietitian) or health care provider, you could help prevent further kidney damage and delay or prevent the need for dialysis. What are tips for following this plan? General instructions  Work with your health care provider and dietitian to develop a meal plan that is right for you. Foods you can eat, limit, or avoid will be different for each person depending on the stage of kidney disease and any other existing health conditions.  Talk with your health care provider about whether you should take a vitamin and mineral supplement.  Use standard measuring cups and spoons to measure servings of foods. Use a kitchen scale to measure portions of protein foods.  If directed by your health care provider, avoid drinking too much fluid. Measure and count all liquids, including water, ice, soups, flavored gelatin, and frozen desserts such as popsicles or ice cream. Reading food labels  Check the amount of sodium in foods. Choose foods that have less than 300 milligrams (mg) per serving.  Check the ingredient list for phosphorus or potassium-based additives or preservatives.  Check the amount of saturated and trans fat. Limit or avoid these fats as told by your dietitian. Shopping  Avoid  buying foods that are: ? Processed, frozen, or prepackaged. ? Calcium-enriched or fortified.  Do not buy foods that have salt or sodium listed among the first five ingredients.  Do not buy canned vegetables. Cooking  Replace animal proteins, such as meat, fish, eggs, or dairy, with plant proteins from beans, nuts, and soy. ? Use soy milk instead of cow's milk. ? Add beans or tofu to soups, casseroles, or pasta dishes instead of meat.  Soak vegetables, such as potatoes, before cooking to reduce potassium. To do this: ? Peel and cut into  small pieces. ? Soak in warm water for at least 2 hours. For every 1 cup of vegetables, use 10 cups of water. ? Drain and rinse with warm water. ? Boil for at least 5 minutes. Meal planning  Limit the amount of protein from plant and animal sources you eat each day.  Do not add salt to food when cooking or before eating.  Eat meals and snacks at around the same time each day. If you have diabetes:  If you have diabetes (diabetes mellitus) and chronic kidney disease, it is important to keep your blood glucose in the target range recommended by your health care provider. Follow your diabetes management plan. This may include: ? Checking your blood glucose regularly. ? Taking oral medicines, insulin, or both. ? Exercising for at least 30 minutes on 5 or more days each week, or as told by your health care provider. ? Tracking how many servings of carbohydrates you eat at each meal.  You may be given specific guidelines on how much of certain foods and nutrients you may eat, depending on your stage of kidney disease and whether you have high blood pressure (hypertension). Follow your meal plan as told by your dietitian. What nutrients should be limited? The items listed are not a complete list. Talk with your dietitian about what dietary choices are best for you. Potassium Potassium affects how steadily your heart beats. If too much potassium builds up  in your blood, it can cause an irregular heartbeat or even a heart attack. You may need to eat less potassium, depending on your blood potassium levels and the stage of kidney disease. Talk to your dietitian about how much potassium you may have each day. You may need to limit or avoid foods that are high in potassium, such as:  Milk and soy milk.  Fruits, such as bananas, papaya, apricots, nectarines, melon, prunes, raisins, kiwi, and oranges.  Vegetables, such as potatoes, sweet potatoes, yams, tomatoes, leafy greens, beets, okra, avocado, pumpkin, and winter squash.  White and lima beans.  Phosphorus Phosphorus is a mineral found in your bones. A balance between calcium and phosphorous is needed to build and maintain healthy bones. Too much phosphorus pulls calcium from your bones. This can make your bones weak and more likely to break. Too much phosphorus can also make your skin itch. You may need to eat less phosphorus depending on your blood phosphorus levels and the stage of kidney disease. Talk to your dietitian about how much potassium you may have each day. You may need to take medicine to lower your blood phosphorus levels if diet changes do not help. You may need to limit or avoid foods that are high in phosphorus, such as:  Milk and dairy products.  Dried beans and peas.  Tofu, soy milk, and other soy-based meat replacements.  Colas.  Nuts and peanut butter.  Meat, poultry, and fish.  Bran cereals and oatmeals.  Protein Protein helps you to make and keep muscle. It also helps in the repair of your body's cells and tissues. One of the natural breakdown products of protein is a waste product called urea. When your kidneys are not working properly, they cannot remove wastes, such as urea, like they did before you developed chronic kidney disease. Reducing how much protein you eat can help prevent a buildup of urea in your blood. Depending on your stage of kidney disease,  you may need to limit foods that are high in protein. Sources of  animal protein include:  Meat (all types).  Fish and seafood.  Poultry.  Eggs.  Dairy.  Other protein foods include:  Beans and legumes.  Nuts and nut butter.  Soy and tofu.  Sodium Sodium, which is found in salt, helps maintain a healthy balance of fluids in your body. Too much sodium can increase your blood pressure and have a negative effect on the function of your heart and lungs. Too much sodium can also cause your body to retain too much fluid, making your kidneys work harder. Most people should have less than 2,300 milligrams (mg) of sodium each day. If you have hypertension, you may need to limit your sodium to 1,500 mg each day. Talk to your dietitian about how much sodium you may have each day. You may need to limit or avoid foods that are high in sodium, such as:  Salt seasonings.  Soy sauce.  Cured and processed meats.  Salted crackers and snack foods.  Fast food.  Canned soups and most canned foods.  Pickled foods.  Vegetable juice.  Boxed mixes or ready-to-eat boxed meals and side dishes.  Bottled dressings, sauces, and marinades.  Summary  Chronic kidney disease can lead to a buildup and imbalance of waste and excess substances in the body. Certain foods lead to a buildup of these substances. By adjusting your intake of these foods, you could help prevent more kidney damage and delay or prevent the need for dialysis.  Food adjustments are different for each person with chronic kidney disease. Work with a dietitian to set up nutrient goals and a meal plan that is right for you.  If you have diabetes and chronic kidney disease, it is important to keep your blood glucose in the target range recommended by your health care provider. This information is not intended to replace advice given to you by your health care provider. Make sure you discuss any questions you have with your health care  provider. Document Released: 04/26/2002 Document Revised: 01/30/2016 Document Reviewed: 01/30/2016 Elsevier Interactive Patient Education  [employer].

## 2017-05-18 ENCOUNTER — Telehealth: Payer: Self-pay | Admitting: Internal Medicine

## 2017-05-18 NOTE — Telephone Encounter (Signed)
New message:    Michele [name] would like to know if this pt has EF% or Heart failure (dx). Pt has been enrolled in a heart failure program but she has to confirm the dx of the pt.

## 2017-05-26 ENCOUNTER — Other Ambulatory Visit: Payer: Self-pay | Admitting: General Practice

## 2017-05-26 ENCOUNTER — Ambulatory Visit (INDEPENDENT_AMBULATORY_CARE_PROVIDER_SITE_OTHER): Payer: Medicare Other | Admitting: General Practice

## 2017-05-26 ENCOUNTER — Other Ambulatory Visit: Payer: Self-pay | Admitting: Internal Medicine

## 2017-05-26 DIAGNOSIS — I4891 Unspecified atrial fibrillation: Secondary | ICD-10-CM | POA: Diagnosis not present

## 2017-05-26 DIAGNOSIS — Z5181 Encounter for therapeutic drug level monitoring: Secondary | ICD-10-CM

## 2017-05-26 LAB — POCT INR: INR: 2

## 2017-05-26 MED ORDER — WARFARIN SODIUM 5 MG PO TABS
ORAL_TABLET | ORAL | 2 refills | Status: DC
Start: 2017-05-26 — End: 2017-05-26

## 2017-05-26 MED ORDER — WARFARIN SODIUM 5 MG PO TABS: ORAL_TABLET | ORAL | 2 refills | Status: DC

## 2017-05-26 NOTE — Patient Instructions (Addendum)
Pre visit review using our clinic review tool, if applicable. No additional management support is needed unless otherwise documented below in the visit note.  Continue to take 1 tablet all days.  Re-check in 4 weeks.  

## 2017-06-16 ENCOUNTER — Ambulatory Visit: Payer: Medicare Other | Admitting: Dietician

## 2017-06-18 ENCOUNTER — Ambulatory Visit: Payer: Medicare Other | Admitting: Dietician

## 2017-06-23 ENCOUNTER — Ambulatory Visit (INDEPENDENT_AMBULATORY_CARE_PROVIDER_SITE_OTHER): Payer: Medicare Other | Admitting: General Practice

## 2017-06-23 DIAGNOSIS — Z5181 Encounter for therapeutic drug level monitoring: Secondary | ICD-10-CM

## 2017-06-23 DIAGNOSIS — I4891 Unspecified atrial fibrillation: Secondary | ICD-10-CM

## 2017-06-23 LAB — POCT INR
INR: 1.8
INR: 2.4
INR: 2.4

## 2017-06-23 NOTE — Patient Instructions (Addendum)
Pre visit review using our clinic review tool, if applicable. No additional management support is needed unless otherwise documented below in the visit note.  Take 1 1/2 tablets today and then continue to take 1 tablet all days.  Re-check in 4 weeks.   

## 2017-07-14 LAB — POCT INR: INR: 1.5 — AB (ref 2.0–3.0)

## 2017-07-15 DIAGNOSIS — Z6820 Body mass index (BMI) 20.0-20.9, adult: Secondary | ICD-10-CM | POA: Diagnosis not present

## 2017-07-15 DIAGNOSIS — Z1231 Encounter for screening mammogram for malignant neoplasm of breast: Secondary | ICD-10-CM | POA: Diagnosis not present

## 2017-07-15 DIAGNOSIS — Z01419 Encounter for gynecological examination (general) (routine) without abnormal findings: Secondary | ICD-10-CM | POA: Diagnosis not present

## 2017-07-21 ENCOUNTER — Ambulatory Visit (INDEPENDENT_AMBULATORY_CARE_PROVIDER_SITE_OTHER): Payer: Medicare Other | Admitting: General Practice

## 2017-07-21 DIAGNOSIS — I4891 Unspecified atrial fibrillation: Secondary | ICD-10-CM

## 2017-07-21 DIAGNOSIS — Z5181 Encounter for therapeutic drug level monitoring: Secondary | ICD-10-CM

## 2017-07-21 LAB — POCT INR: INR: 1.9 — AB (ref 2.0–3.0)

## 2017-07-21 NOTE — Patient Instructions (Addendum)
Pre visit review using our clinic review tool, if applicable. No additional management support is needed unless otherwise documented below in the visit note.  Take 1 1/2 tablets today and then continue to take 1 tablet all days except 1 1/2 tablets every Tuesday.  Re-check in 4 weeks.

## 2017-07-23 ENCOUNTER — Encounter: Payer: Medicare Other | Admitting: *Deleted

## 2017-07-23 ENCOUNTER — Telehealth: Payer: Self-pay | Admitting: Cardiology

## 2017-07-23 NOTE — Telephone Encounter (Signed)
LMOVM reminding pt to send remote transmission.   

## 2017-07-24 ENCOUNTER — Encounter: Payer: Self-pay | Admitting: Cardiology

## 2017-08-14 ENCOUNTER — Other Ambulatory Visit: Payer: Self-pay | Admitting: Internal Medicine

## 2017-08-18 ENCOUNTER — Ambulatory Visit (INDEPENDENT_AMBULATORY_CARE_PROVIDER_SITE_OTHER): Payer: Medicare Other | Admitting: General Practice

## 2017-08-18 DIAGNOSIS — I4891 Unspecified atrial fibrillation: Secondary | ICD-10-CM

## 2017-08-18 DIAGNOSIS — Z5181 Encounter for therapeutic drug level monitoring: Secondary | ICD-10-CM

## 2017-08-18 LAB — POCT INR: INR: 2.2 (ref 2.0–3.0)

## 2017-08-18 NOTE — Patient Instructions (Addendum)
Pre visit review using our clinic review tool, if applicable. No additional management support is needed unless otherwise documented below in the visit note.  Continue to take 1 tablet all days except 1 1/2 tablets every Tuesday.  Re-check in 4 weeks.

## 2017-09-09 ENCOUNTER — Ambulatory Visit (INDEPENDENT_AMBULATORY_CARE_PROVIDER_SITE_OTHER): Payer: Medicare Other | Admitting: *Deleted

## 2017-09-09 DIAGNOSIS — I4891 Unspecified atrial fibrillation: Secondary | ICD-10-CM | POA: Diagnosis not present

## 2017-09-09 DIAGNOSIS — I428 Other cardiomyopathies: Secondary | ICD-10-CM

## 2017-09-09 NOTE — Progress Notes (Signed)
Remote ICD transmission.   

## 2017-09-15 ENCOUNTER — Ambulatory Visit: Payer: Medicare Other

## 2017-09-15 DIAGNOSIS — H2513 Age-related nuclear cataract, bilateral: Secondary | ICD-10-CM | POA: Diagnosis not present

## 2017-09-15 DIAGNOSIS — H40033 Anatomical narrow angle, bilateral: Secondary | ICD-10-CM | POA: Diagnosis not present

## 2017-10-12 DIAGNOSIS — H25813 Combined forms of age-related cataract, bilateral: Secondary | ICD-10-CM | POA: Diagnosis not present

## 2017-10-12 DIAGNOSIS — G43809 Other migraine, not intractable, without status migrainosus: Secondary | ICD-10-CM | POA: Diagnosis not present

## 2017-10-12 DIAGNOSIS — H40013 Open angle with borderline findings, low risk, bilateral: Secondary | ICD-10-CM | POA: Diagnosis not present

## 2017-10-12 DIAGNOSIS — H04123 Dry eye syndrome of bilateral lacrimal glands: Secondary | ICD-10-CM | POA: Diagnosis not present

## 2017-10-13 ENCOUNTER — Telehealth: Payer: Self-pay | Admitting: General Practice

## 2017-10-13 NOTE — Telephone Encounter (Signed)
LMOVM.  Patient needs to schedule appointment for INR with Villa Herb, RN.

## 2017-10-19 LAB — CUP PACEART REMOTE DEVICE CHECK
Battery Remaining Longevity: 33 mo
Battery Voltage: 2.94 V
Brady Statistic AP VP Percent: 0 %
Brady Statistic AS VP Percent: 99.87 %
Brady Statistic AS VS Percent: 0.13 %
Brady Statistic RA Percent Paced: 0 %
Brady Statistic RV Percent Paced: 99.91 %
Date Time Interrogation Session: 20190724121805
HIGH POWER IMPEDANCE MEASURED VALUE: 73 Ohm
HighPow Impedance: 49 Ohm
Implantable Lead Implant Date: 20100121
Implantable Lead Implant Date: 20100121
Implantable Lead Implant Date: 20130704
Implantable Lead Location: 753859
Implantable Lead Location: 753860
Implantable Lead Model: 5076
Implantable Lead Model: 7120
Lead Channel Impedance Value: 418 Ohm
Lead Channel Impedance Value: 532 Ohm
Lead Channel Impedance Value: 836 Ohm
Lead Channel Pacing Threshold Amplitude: 0.875 V
Lead Channel Pacing Threshold Amplitude: 2 V
Lead Channel Pacing Threshold Pulse Width: 0.4 ms
Lead Channel Pacing Threshold Pulse Width: 0.8 ms
Lead Channel Sensing Intrinsic Amplitude: 2.375 mV
Lead Channel Setting Pacing Amplitude: 2 V
Lead Channel Setting Pacing Pulse Width: 0.4 ms
MDC IDC LEAD IMPLANT DT: 20080908
MDC IDC LEAD LOCATION: 753858
MDC IDC LEAD LOCATION: 753860
MDC IDC MSMT LEADCHNL LV IMPEDANCE VALUE: 456 Ohm
MDC IDC MSMT LEADCHNL RA IMPEDANCE VALUE: 513 Ohm
MDC IDC MSMT LEADCHNL RA SENSING INTR AMPL: 2.375 mV
MDC IDC MSMT LEADCHNL RV IMPEDANCE VALUE: 304 Ohm
MDC IDC PG IMPLANT DT: 20171004
MDC IDC SET LEADCHNL LV PACING AMPLITUDE: 3 V
MDC IDC SET LEADCHNL LV PACING PULSEWIDTH: 0.8 ms
MDC IDC SET LEADCHNL RV SENSING SENSITIVITY: 0.3 mV
MDC IDC STAT BRADY AP VS PERCENT: 0 %

## 2017-10-23 ENCOUNTER — Ambulatory Visit: Payer: Medicare Other

## 2017-11-06 ENCOUNTER — Ambulatory Visit: Payer: Medicare Other | Admitting: Internal Medicine

## 2017-11-06 ENCOUNTER — Ambulatory Visit: Payer: Medicare Other

## 2017-11-10 ENCOUNTER — Ambulatory Visit: Payer: Medicare Other

## 2017-11-26 ENCOUNTER — Other Ambulatory Visit: Payer: Self-pay | Admitting: Internal Medicine

## 2017-12-09 ENCOUNTER — Ambulatory Visit (INDEPENDENT_AMBULATORY_CARE_PROVIDER_SITE_OTHER): Payer: Medicare Other | Admitting: *Deleted

## 2017-12-09 DIAGNOSIS — I428 Other cardiomyopathies: Secondary | ICD-10-CM

## 2017-12-09 NOTE — Progress Notes (Signed)
Remote ICD transmission.   

## 2017-12-31 LAB — CUP PACEART REMOTE DEVICE CHECK
Battery Remaining Longevity: 26 mo
Battery Voltage: 2.94 V
Brady Statistic AP VP Percent: 0 %
Brady Statistic AS VS Percent: 0.59 %
Brady Statistic RV Percent Paced: 99.56 %
Date Time Interrogation Session: 20191023073524
HIGH POWER IMPEDANCE MEASURED VALUE: 45 Ohm
HighPow Impedance: 71 Ohm
Implantable Lead Implant Date: 20080908
Implantable Lead Implant Date: 20100121
Implantable Lead Implant Date: 20100121
Implantable Lead Implant Date: 20130704
Implantable Lead Location: 753858
Implantable Lead Model: 5076
Implantable Lead Model: 5076
Lead Channel Impedance Value: 304 Ohm
Lead Channel Impedance Value: 475 Ohm
Lead Channel Impedance Value: 513 Ohm
Lead Channel Impedance Value: 779 Ohm
Lead Channel Pacing Threshold Amplitude: 0.75 V
Lead Channel Pacing Threshold Amplitude: 1.5 V
Lead Channel Sensing Intrinsic Amplitude: 1.625 mV
Lead Channel Setting Pacing Amplitude: 2 V
Lead Channel Setting Pacing Amplitude: 2.75 V
Lead Channel Setting Pacing Pulse Width: 0.4 ms
MDC IDC LEAD LOCATION: 753859
MDC IDC LEAD LOCATION: 753860
MDC IDC LEAD LOCATION: 753860
MDC IDC MSMT LEADCHNL LV IMPEDANCE VALUE: 418 Ohm
MDC IDC MSMT LEADCHNL LV PACING THRESHOLD PULSEWIDTH: 0.8 ms
MDC IDC MSMT LEADCHNL RA SENSING INTR AMPL: 1.625 mV
MDC IDC MSMT LEADCHNL RV IMPEDANCE VALUE: 418 Ohm
MDC IDC MSMT LEADCHNL RV PACING THRESHOLD PULSEWIDTH: 0.4 ms
MDC IDC PG IMPLANT DT: 20171004
MDC IDC SET LEADCHNL LV PACING PULSEWIDTH: 0.8 ms
MDC IDC SET LEADCHNL RV SENSING SENSITIVITY: 0.3 mV
MDC IDC STAT BRADY AP VS PERCENT: 0 %
MDC IDC STAT BRADY AS VP PERCENT: 99.41 %
MDC IDC STAT BRADY RA PERCENT PACED: 0 %

## 2018-01-04 ENCOUNTER — Other Ambulatory Visit: Payer: Self-pay | Admitting: Internal Medicine

## 2018-01-19 ENCOUNTER — Ambulatory Visit: Payer: Medicare Other

## 2018-01-26 ENCOUNTER — Ambulatory Visit: Payer: Medicare Other | Admitting: Internal Medicine

## 2018-01-26 ENCOUNTER — Ambulatory Visit: Payer: Medicare Other

## 2018-01-26 DIAGNOSIS — Z0289 Encounter for other administrative examinations: Secondary | ICD-10-CM

## 2018-01-28 ENCOUNTER — Ambulatory Visit: Payer: Medicare Other | Admitting: Internal Medicine

## 2018-01-28 DIAGNOSIS — Z0289 Encounter for other administrative examinations: Secondary | ICD-10-CM

## 2018-02-17 DIAGNOSIS — S32509A Unspecified fracture of unspecified pubis, initial encounter for closed fracture: Secondary | ICD-10-CM

## 2018-02-17 HISTORY — DX: Unspecified fracture of unspecified pubis, initial encounter for closed fracture: S32.509A

## 2018-03-02 ENCOUNTER — Emergency Department (HOSPITAL_COMMUNITY)
Admission: EM | Admit: 2018-03-02 | Discharge: 2018-03-02 | Disposition: A | Discharge status: Home / Self Care | Payer: Medicare Other | Attending: Emergency Medicine | Admitting: Emergency Medicine

## 2018-03-02 ENCOUNTER — Telehealth: Payer: Self-pay | Admitting: Dietician

## 2018-03-02 ENCOUNTER — Telehealth: Payer: Self-pay

## 2018-03-02 ENCOUNTER — Encounter (HOSPITAL_COMMUNITY): Payer: Self-pay | Admitting: *Deleted

## 2018-03-02 ENCOUNTER — Other Ambulatory Visit: Payer: Self-pay

## 2018-03-02 ENCOUNTER — Emergency Department (HOSPITAL_COMMUNITY): Payer: Medicare Other

## 2018-03-02 DIAGNOSIS — R52 Pain, unspecified: Secondary | ICD-10-CM | POA: Diagnosis not present

## 2018-03-02 DIAGNOSIS — Z95810 Presence of automatic (implantable) cardiac defibrillator: Secondary | ICD-10-CM | POA: Insufficient documentation

## 2018-03-02 DIAGNOSIS — S0003XA Contusion of scalp, initial encounter: Secondary | ICD-10-CM | POA: Diagnosis not present

## 2018-03-02 DIAGNOSIS — Y999 Unspecified external cause status: Secondary | ICD-10-CM | POA: Insufficient documentation

## 2018-03-02 DIAGNOSIS — E039 Hypothyroidism, unspecified: Secondary | ICD-10-CM | POA: Diagnosis not present

## 2018-03-02 DIAGNOSIS — S300XXA Contusion of lower back and pelvis, initial encounter: Secondary | ICD-10-CM

## 2018-03-02 DIAGNOSIS — Z79899 Other long term (current) drug therapy: Secondary | ICD-10-CM | POA: Diagnosis not present

## 2018-03-02 DIAGNOSIS — W19XXXA Unspecified fall, initial encounter: Secondary | ICD-10-CM | POA: Diagnosis not present

## 2018-03-02 DIAGNOSIS — Y939 Activity, unspecified: Secondary | ICD-10-CM | POA: Insufficient documentation

## 2018-03-02 DIAGNOSIS — R404 Transient alteration of awareness: Secondary | ICD-10-CM | POA: Diagnosis not present

## 2018-03-02 DIAGNOSIS — S0990XA Unspecified injury of head, initial encounter: Secondary | ICD-10-CM | POA: Diagnosis not present

## 2018-03-02 DIAGNOSIS — Z7901 Long term (current) use of anticoagulants: Secondary | ICD-10-CM | POA: Diagnosis not present

## 2018-03-02 DIAGNOSIS — I509 Heart failure, unspecified: Secondary | ICD-10-CM | POA: Insufficient documentation

## 2018-03-02 DIAGNOSIS — Y92009 Unspecified place in unspecified non-institutional (private) residence as the place of occurrence of the external cause: Secondary | ICD-10-CM | POA: Diagnosis not present

## 2018-03-02 DIAGNOSIS — R42 Dizziness and giddiness: Secondary | ICD-10-CM | POA: Diagnosis not present

## 2018-03-02 DIAGNOSIS — S3992XA Unspecified injury of lower back, initial encounter: Secondary | ICD-10-CM | POA: Diagnosis not present

## 2018-03-02 DIAGNOSIS — M545 Low back pain: Secondary | ICD-10-CM | POA: Diagnosis not present

## 2018-03-02 DIAGNOSIS — S199XXA Unspecified injury of neck, initial encounter: Secondary | ICD-10-CM | POA: Diagnosis not present

## 2018-03-02 DIAGNOSIS — N183 Chronic kidney disease, stage 3 (moderate): Secondary | ICD-10-CM | POA: Insufficient documentation

## 2018-03-02 DIAGNOSIS — R2681 Unsteadiness on feet: Secondary | ICD-10-CM | POA: Diagnosis not present

## 2018-03-02 DIAGNOSIS — W010XXA Fall on same level from slipping, tripping and stumbling without subsequent striking against object, initial encounter: Secondary | ICD-10-CM | POA: Diagnosis not present

## 2018-03-02 DIAGNOSIS — S3993XA Unspecified injury of pelvis, initial encounter: Secondary | ICD-10-CM | POA: Diagnosis not present

## 2018-03-02 DIAGNOSIS — M549 Dorsalgia, unspecified: Secondary | ICD-10-CM | POA: Diagnosis not present

## 2018-03-02 LAB — CBC WITH DIFFERENTIAL/PLATELET
Abs Immature Granulocytes: 0.01 10*3/uL (ref 0.00–0.07)
BASOS ABS: 0 10*3/uL (ref 0.0–0.1)
BASOS PCT: 0 %
EOS ABS: 0 10*3/uL (ref 0.0–0.5)
Eosinophils Relative: 1 %
HCT: 42.5 % (ref 36.0–46.0)
Hemoglobin: 14.5 g/dL (ref 12.0–15.0)
IMMATURE GRANULOCYTES: 0 %
Lymphocytes Relative: 29 %
Lymphs Abs: 1.9 10*3/uL (ref 0.7–4.0)
MCH: 30.1 pg (ref 26.0–34.0)
MCHC: 34.1 g/dL (ref 30.0–36.0)
MCV: 88.2 fL (ref 80.0–100.0)
MONOS PCT: 5 %
Monocytes Absolute: 0.3 10*3/uL (ref 0.1–1.0)
NEUTROS PCT: 65 %
NRBC: 0 % (ref 0.0–0.2)
Neutro Abs: 4.1 10*3/uL (ref 1.7–7.7)
PLATELETS: 250 10*3/uL (ref 150–400)
RBC: 4.82 MIL/uL (ref 3.87–5.11)
RDW: 12.7 % (ref 11.5–15.5)
WBC: 6.4 10*3/uL (ref 4.0–10.5)

## 2018-03-02 LAB — COMPREHENSIVE METABOLIC PANEL
ALT: 17 U/L (ref 0–44)
AST: 28 U/L (ref 15–41)
Albumin: 4.1 g/dL (ref 3.5–5.0)
Alkaline Phosphatase: 78 U/L (ref 38–126)
Anion gap: 11 (ref 5–15)
BUN: 26 mg/dL — AB (ref 8–23)
CHLORIDE: 99 mmol/L (ref 98–111)
CO2: 27 mmol/L (ref 22–32)
Calcium: 9.5 mg/dL (ref 8.9–10.3)
Creatinine, Ser: 1.48 mg/dL — ABNORMAL HIGH (ref 0.44–1.00)
GFR calc Af Amer: 37 mL/min — ABNORMAL LOW (ref >60)
GFR, EST NON AFRICAN AMERICAN: 32 mL/min — AB (ref >60)
Glucose, Bld: 95 mg/dL (ref 70–99)
POTASSIUM: 3.6 mmol/L (ref 3.5–5.1)
SODIUM: 137 mmol/L (ref 135–145)
Total Bilirubin: 3.8 mg/dL — ABNORMAL HIGH (ref 0.3–1.2)
Total Protein: 7.6 g/dL (ref 6.5–8.1)

## 2018-03-02 LAB — PROTIME-INR
INR: 1.44
Prothrombin Time: 17.4 seconds — ABNORMAL HIGH (ref 11.4–15.2)

## 2018-03-02 MED ORDER — TRAMADOL HCL 50 MG PO TABS: 25.0000 mg | ORAL_TABLET | Freq: Four times a day (QID) | ORAL | 0 refills | Status: DC | PRN

## 2018-03-02 MED ORDER — FUROSEMIDE 20 MG PO TABS: 20.0000 mg | ORAL_TABLET | ORAL | Status: DC | PRN

## 2018-03-02 MED ORDER — VITAMIN D 25 MCG (1000 UNIT) PO TABS: 1000.0000 [IU] | ORAL_TABLET | Freq: Every day | ORAL | Status: DC

## 2018-03-02 MED ORDER — LEVOTHYROXINE SODIUM 50 MCG PO TABS
50.0000 ug | ORAL_TABLET | Freq: Every day | ORAL | Status: DC
  Filled 2018-03-02: qty 1

## 2018-03-02 NOTE — ED Notes (Signed)
Patient transported to X-ray 

## 2018-03-02 NOTE — Discharge Instructions (Addendum)
Case management will follow up with you in the upcoming days.  Return to the emergency department for any new and/or concerning symptoms.

## 2018-03-02 NOTE — ED Notes (Signed)
Patient is resting comfortably. 

## 2018-03-02 NOTE — Progress Notes (Addendum)
2:23pm-CSW received message from pt's daughter [name] and was informed that she will be here around [place].   1:29pm-CSW spoke with Charlena Cross from [place]. CSW was advised that they can not take report at this time as pt is no tin the home and is "safe' in the hospital. CSW was advised that CSW could call  back and make report once pt has been discharged. CSW to update 2nd shift CSW as needed. Daughter expressed that her and husband may be down to see pt shortly.   1:09pm-CSW left voicemail for [facility] APS at this time.   1:07pm-CSW was informed that per Levada Dy this is not something that they do in regards to welfare checks. CSW will follow up with APS.  12:56pm-CSW spoke with pt's daughter to get information for pt's neighbor Vaughan Basta. CSW advised daughter that CSW has reached out to [employer] to se if they are able to check on the conditions of pt's house. CSW was giving contact information for Vaughan Basta  [phone] and received verbal permission from pt for Vaughan Basta to let officer in the home.  CSW updated daughter and informed  her that it may be best if she or brother could come and be with pt. Daughter expressed that she is wishing to however pt can be mean at times and daughter doesn't  want to drive her and then be sent back home by pt. CSW advised daughter of understanding this however CSW suggested that pt has to be safe if going back home and CSW reiterated that pt's safety is first at this time. Daughter expressed understanding and expressed that if pt is needing to be picked up then [facility] pt's neighbor could do it if called.   CSW consulted for possible placement needs. CSW and RNCM spoke with pt at bedside who expresses living alone and being able to care for self. CSW received phone call from pt's daughter [name] and was informed that she lives in [place] and is unable to be here with pt daily. Per daughter pt has been showing signs of dementia as daugher expressed that pt has been  speaking of people being in her addict and expressed that she has been "pulling the wires out to keep them from having lights". Daughter expressed concerns of this as she feels that since pt lives alone something else could happen to pt. Daughter expressed that she dn brother have been trying to et pt to come and live wit them or to go to a facility and pt declines it each time. CSW attempted to speak with pt about placement once more and pt expressed "idk I just wanna enjoy my house". CSW understanding but did express to pt concerns of pt returning home if no running water is int he kitchen.    Per daughter pt has running water upstairs but not in the kitchen. Being that daughter expressed concerns with mental capacities CSW offered to have pt seen by TTS to determine further psych needs. Daughter expressed that she didn't want [place] to waste our time as pt refuses everything they offer. CSW spoke with MD and was informed that pt really needs to be seen by a neuropsychologist to determine further mental needs. CSW updated daughter and was informed that she and brother have  A offcier that does wellness checks on pt to ensure that pt is safe. CSW aware that MD is having further tests ran on pt at this time and will updates CSW as needed.  At this time the plan is for further test to be ran, daughter to send CSW the officer number to ensure that living situation is safe, and if not CSW to make APS report as needed since pt has no running water in the downstairs portion of her home. CSW to continue to follow for further needs.     Virgie Dad Strider Vallance, MSW, [facility] Clinical Social Worker [phone]

## 2018-03-02 NOTE — Telephone Encounter (Signed)
FYI  Copied from [place] [id number]. Topic: General - Inquiry >> Mar 02, 2018  2:46 PM Scherrie Gerlach wrote: Reason for CRM: FYI: daughter called and is coming from [place] as pt is in the ED now.  She state they are concerned for the pt's safety at home. Pt not taking meds or getting coum checked regularly. Pt refusing to leave the home. Daughter has scheduled pt appt next Monday 3:20 pm, but wanted to give Dr Jenny Reichmann a heads up on issues. Either son or she will bring the pt to the appt.  New DPR needs to be updated.

## 2018-03-02 NOTE — ED Provider Notes (Signed)
[facility] EMERGENCY DEPARTMENT Provider Note   CSN: [id number] Arrival date & time: 03/02/18  0326     History   Chief Complaint Chief Complaint  Patient presents with  . Fall    HPI Michele [name] is a 85 y.o. female.  Patient presents to the emergency department for evaluation after a fall.  Patient reports that she fell, circumstances are unclear.  Patient reports that she remembers hitting the back of her head and is complaining of pain in the area of her buttocks.  He is not complaining of extremity pain.  No upper back or neck pain.  She did not lose consciousness.     Past Medical History:  Diagnosis Date  . Atrial fibrillation ([place])    NEG STRESS CARDIOLITE  . C. difficile colitis   . Cardiomyopathy    RATE RELATED  . CHF (congestive heart failure) ([place])   . Diverticulitis    PMH of X 2  . Diverticulosis    DIVERTICULITIS  . Gilbert's syndrome   . Hx of colonic polyp 2011   Dr Fuller Plan  . Hyperlipidemia    NMR LIOPROFILE LDL 234(3206/2234)HDL 37,TG86  . Renal insufficiency 12/09/2015  . Thyroid disease    HYPOTHYROIDISM...THYROID NODULES    Patient Active Problem List   Diagnosis Date Noted  . Hypokalemia 01/23/2017  . Hypophosphatemia 01/23/2017  . Syncope 01/22/2017  . Aphasia 01/22/2017  . Stereotyped movements 01/22/2017  . Anticoagulated 06/07/2016  . Stroke (cerebrum) ([place]) 05/27/2016  . Vertebrobasilar artery syndrome   . Complicated migraine   . Acute embolic stroke ([place]) 84/66/5993  . AKI (acute kidney injury) ([place]) 12/13/2015  . Urinary retention 12/13/2015  . CKD (chronic kidney disease) stage 3, GFR 30-59 ml/min (HCC) 12/09/2015  . Preventative health care 07/13/2015  . Grief 07/13/2015  . Encounter for therapeutic drug monitoring 04/20/2013  . Cardiomyopathy, secondary ([place]) 03/20/2009  . CHEST PAIN -.Peri INCISIONAL 07/14/2008  . Automatic implantable cardioverter-defibrillator in situ 04/19/2008  . FATIGUE  08/31/2007  . Personal history of other diseases of digestive system 04/14/2007  . Hyperlipidemia 12/22/2006  . Anxiety state 11/27/2006  . KERATOCONJUNCTIVITIS SICCA 09/08/2006  . ATRIAL FIBRILLATION 07/14/2006  . Hypothyroidism, postsurgical 07/09/2006    Past Surgical History:  Procedure Laterality Date  . ABDOMINAL HYSTERECTOMY  1973  . APPENDECTOMY  1956  . BIOPSY THYROID     SINGLE NODULE  . BREAST BIOPSY  1997  . CARDIAC DEFIBRILLATOR PLACEMENT    . CHOLECYSTECTOMY  1972  . colonoscopy with polypectomy  2011  . EP IMPLANTABLE DEVICE N/A 11/21/2015   Procedure: BIV ICD Generator Changeout;  Surgeon: Deboraha Sprang, MD;  Location: [facility] CV LAB;  Service: Cardiovascular;  Laterality: N/A;  . ICD....02/2008    . LEAD REVISION N/A 08/21/2011   Procedure: LEAD REVISION;  Surgeon: Deboraha Sprang, MD;  Location: [facility] CATH LAB;  Service: Cardiovascular;  Laterality: N/A;  . PACEMAKER PLACEMENT     10/2006  . ROTATOR CUFF REPAIR     x2  . THROIDECTOMY 12/08     benign     OB History   No obstetric history on file.      Home Medications    Prior to Admission medications   Medication Sig Start Date End Date Taking? Authorizing Provider  cholecalciferol (VITAMIN D) 1000 UNITS tablet Take 1,000 Units by mouth daily.    [provider]  co-enzyme Q-10 30 MG capsule Take 30 mg by mouth daily.  [provider]  furosemide (LASIX) 20 MG tablet TAKE 1 TABLET (20 MG TOTAL) BY MOUTH AS NEEDED FOR FLUID. 12/29/16   Biagio Borg, MD  SYNTHROID 50 MCG tablet TAKE 1 TABLET BY MOUTH EVERY DAY 08/14/17   Biagio Borg, MD  warfarin (COUMADIN) 5 MG tablet Take 1 daily except take 1 1/2 tablets on Mondays orTAKE AS DIRECTED.  2 week supply.  Patient must have INR checked before further re-fills will be approved. 01/04/18   Biagio Borg, MD    Family History Family History  Problem Relation Age of Onset  . Heart attack Mother 20  . Breast cancer Sister   . Heart  attack Brother 59  . Lung disease Father        Silicosis  . Vasculitis Sister        Giant Cell Arteritis  . Ulcerative colitis Son     Social History Social History   Tobacco Use  . Smoking status: Never Smoker  . Smokeless tobacco: Never Used  Substance Use Topics  . Alcohol use: No  . Drug use: No     Allergies   Cephalexin; Erythromycin; Terbinafine hcl; Colchicine; Crestor [rosuvastatin calcium]; Rosuvastatin; and Vytorin [ezetimibe-simvastatin]   Review of Systems Review of Systems  Musculoskeletal: Positive for arthralgias and back pain.  Neurological: Positive for headaches.  All other systems reviewed and are negative.    Physical Exam Updated Vital Signs BP 132/77   Pulse 69   Resp 16   SpO2 98%   Physical Exam Vitals signs and nursing note reviewed.  Constitutional:      General: She is not in acute distress.    Appearance: Normal appearance. She is well-developed.  HENT:     Head: Normocephalic. Contusion present.      Right Ear: Hearing normal.     Left Ear: Hearing normal.     Nose: Nose normal.  Eyes:     Conjunctiva/sclera: Conjunctivae normal.     Pupils: Pupils are equal, round, and reactive to light.  Neck:     Musculoskeletal: Normal range of motion and neck supple.  Cardiovascular:     Rate and Rhythm: Regular rhythm.     Heart sounds: S1 normal and S2 normal. No murmur. No friction rub. No gallop.   Pulmonary:     Effort: Pulmonary effort is normal. No respiratory distress.     Breath sounds: Normal breath sounds.  Chest:     Chest wall: No tenderness.  Abdominal:     General: Bowel sounds are normal.     Palpations: Abdomen is soft.     Tenderness: There is no abdominal tenderness. There is no guarding or rebound. Negative signs include Murphy's sign and McBurney's sign.     Hernia: No hernia is present.  Musculoskeletal: Normal range of motion.     Lumbar back: She exhibits tenderness.       Back:  Skin:    General:  Skin is warm and dry.     Findings: No rash.  Neurological:     Mental Status: She is alert and oriented to person, place, and time.     GCS: GCS eye subscore is 4. GCS verbal subscore is 5. GCS motor subscore is 6.     Cranial Nerves: No cranial nerve deficit.     Sensory: No sensory deficit.     Coordination: Coordination normal.  Psychiatric:        Speech: Speech normal.  Behavior: Behavior normal.        Thought Content: Thought content normal.      ED Treatments / Results  Labs (all labs ordered are listed, but only abnormal results are displayed) Labs Reviewed - No data to display  EKG None  Radiology Dg Lumbar Spine Complete  Result Date: 03/02/2018 CLINICAL DATA:  Fall with lower back pain EXAM: LUMBAR SPINE - COMPLETE 4+ VIEW COMPARISON:  09/06/2014 abdominal CT FINDINGS: No evident fracture or traumatic malalignment. There is generalized disc narrowing and endplate/facet spurring with slight levocurvature of the lumbar spine. IMPRESSION: No acute finding. Electronically Signed   By: Monte Fantasia M.D.   On: 03/02/2018 04:24   Dg Pelvis 1-2 Views  Result Date: 03/02/2018 CLINICAL DATA:  Fall with lower back pain EXAM: PELVIS - 1-2 VIEW COMPARISON:  None. FINDINGS: Limited by rightward rotation. Limited by bowel gas overlapping the sacrum. No evident pelvic ring fracture or diastasis. Both hips are located and grossly intact. Osteopenia. Right-sided nodular calcifications that are mesenteric by 2015 CT. IMPRESSION: No acute finding. Electronically Signed   By: Monte Fantasia M.D.   On: 03/02/2018 04:25   Dg Sacrum/coccyx  Result Date: 03/02/2018 CLINICAL DATA:  Fall tonight with lower back pain EXAM: SACRUM AND COCCYX - 2+ VIEW COMPARISON:  09/06/2014 abdominal CT FINDINGS: As permitted by overlapping bowel gas there is no evidence of sacral fracture. No sacroiliac diastasis. Osteopenia and lumbar spinal degeneration. IMPRESSION: Negative. Electronically Signed    By: Monte Fantasia M.D.   On: 03/02/2018 04:26   Ct Head Wo Contrast  Result Date: 03/02/2018 CLINICAL DATA:  Fall.  Head laceration. Initial encounter. EXAM: CT HEAD WITHOUT CONTRAST CT CERVICAL SPINE WITHOUT CONTRAST TECHNIQUE: Multidetector CT imaging of the head and cervical spine was performed following the standard protocol without intravenous contrast. Multiplanar CT image reconstructions of the cervical spine were also generated. COMPARISON:  01/22/2017 head CT FINDINGS: CT HEAD FINDINGS Brain: No evidence of acute infarction, hemorrhage, hydrocephalus, extra-axial collection or mass lesion/mass effect. Cerebral volume loss and moderate chronic small vessel ischemia in the white matter. Vascular: No hyperdense vessel or unexpected calcification. Skull: Negative for fracture Sinuses/Orbits: No evident injury. CT CERVICAL SPINE FINDINGS Alignment: No traumatic malalignment. Mild degenerative anterolisthesis at C3-4 and T1-2. Skull base and vertebrae: Negative for fracture Soft tissues and spinal canal: No prevertebral fluid or swelling. No visible canal hematoma. Thyroidectomy. Disc levels:  Generalized disc narrowing and facet spurring. Upper chest: Negative IMPRESSION: No evidence of acute intracranial or cervical spine injury. Electronically Signed   By: Monte Fantasia M.D.   On: 03/02/2018 04:45   Ct Cervical Spine Wo Contrast  Result Date: 03/02/2018 CLINICAL DATA:  Fall.  Head laceration. Initial encounter. EXAM: CT HEAD WITHOUT CONTRAST CT CERVICAL SPINE WITHOUT CONTRAST TECHNIQUE: Multidetector CT imaging of the head and cervical spine was performed following the standard protocol without intravenous contrast. Multiplanar CT image reconstructions of the cervical spine were also generated. COMPARISON:  01/22/2017 head CT FINDINGS: CT HEAD FINDINGS Brain: No evidence of acute infarction, hemorrhage, hydrocephalus, extra-axial collection or mass lesion/mass effect. Cerebral volume loss and  moderate chronic small vessel ischemia in the white matter. Vascular: No hyperdense vessel or unexpected calcification. Skull: Negative for fracture Sinuses/Orbits: No evident injury. CT CERVICAL SPINE FINDINGS Alignment: No traumatic malalignment. Mild degenerative anterolisthesis at C3-4 and T1-2. Skull base and vertebrae: Negative for fracture Soft tissues and spinal canal: No prevertebral fluid or swelling. No visible canal hematoma. Thyroidectomy. Disc levels:  Generalized disc narrowing and facet spurring. Upper chest: Negative IMPRESSION: No evidence of acute intracranial or cervical spine injury. Electronically Signed   By: Monte Fantasia M.D.   On: 03/02/2018 04:45    Procedures Procedures (including critical care time)  Medications Ordered in ED Medications - No data to display   Initial Impression / Assessment and Plan / ED Course  I have reviewed the triage vital signs and the nursing notes.  Pertinent labs & imaging results that were available during my care of the patient were reviewed by me and considered in my medical decision making (see chart for details).     Patient presents to the ER for evaluation after a fall.  Circumstances are unclear.  Patient reports that she slipped and fell but she was found sitting at the bottom of stairs when EMS got to her.  It is not clear if she actually fell on the stairs, however.  Patient's only complaint is left buttock area pain.  She is able to move her legs without difficulty.  She lifts her left leg off the bed without any pain, no concern for hip fracture.  Pelvic x-ray negative.  Lumbosacral x-ray and coccyx x-ray are negative.  She did undergo CT head and cervical spine as she does have a contusion in the back of her head.  No acute abnormality is noted.  Patient does not require any further evaluation at this time.  Patient does, however, seem a little bit confused.  She reports that she lives alone.  Will require case  management/social work evaluation to determine her her current living arrangements and safety.    Final Clinical Impressions(s) / ED Diagnoses   Final diagnoses:  Contusion of scalp, initial encounter  Coccyx contusion, initial encounter    ED Discharge Orders    None       Jonnatan Hanners, Gwenyth Allegra, MD 03/02/18 [phone]

## 2018-03-02 NOTE — ED Triage Notes (Signed)
Pt from home after a fall. EMS reports pt's bedroom is upstairs, pt found sitting at the bottom of her steps. C/o hematoma to posterior head and L buttock pain. Pt unable to recall events of fall, unsure if pt fell down her steps. Towel made c-collar in place. Pt is on coumadin. EMS reports pt was able to stand and sit onto their stretcher but was very unsteady on her feet.

## 2018-03-02 NOTE — ED Provider Notes (Signed)
Care assumed from Dr. Maryan Rued at shift change.  Patient came here last night for evaluation of a fall.  Her imaging studies and laboratory studies are unremarkable.  There were multiple social issues that have been addressed by case management and social work.  The daughter has come here to take her home and is comfortable with this disposition.  Patient will be discharged and is to follow-up as needed.   Veryl Speak, MD 03/02/18 [phone]

## 2018-03-02 NOTE — Telephone Encounter (Signed)
Brief nutrition note Patient's daughter, Chilton Greathouse, called regarding her mother Ms. Zaborowski.  She asked if her mother would have side effects from not following the dietary advise recommended.  Ms. Olejnik is being followed by a dietitian.  I informed Ms. Runkel that her mother has not been seen in this office.  Currently she is in the hospital post fall.  Antonieta Iba, RD, LDN, CDE

## 2018-03-02 NOTE — Patient Outreach (Addendum)
[facility]) Care Management  03/02/2018  DODIE PARISI 12-Nov-1929 [id number]   Received message through Epic to contact Fuller Mandril, RN.  Telephone call to [place].  No answer.  Message left.    Plan: RN CM will continue to monitor patient hospital status and wait return call from [place].  3:55 pm Update:  Spoke with [place].  Patient safety is of concern at this time.  Daughter is coming from [place] to assess home status.  Advised her that CM would reach out to patient once discharged from the ER for telephonic assessment.  She verbalized understanding.    Plan: RN CM will continue to monitor hospital status.   Jone Baseman, RN, MSN [place] Management Care Management Coordinator Direct Line [phone] Cell [phone] Toll Free: [phone]  Fax: [phone]

## 2018-03-02 NOTE — Discharge Planning (Signed)
EDCM and EDSW met with pt at bedside regarding possible SNF placement and concerns for safety at home.  Pt states this fall was a one-time event and she feels safe living alone.  EDCM relayed information to EDP.  EDP will have pt walk prior to discharge to access strength.  EDCM will continue to follow.   

## 2018-03-02 NOTE — ED Notes (Signed)
Please note: I-Stat drawn in case it's ordered. On rocker in [facility].

## 2018-03-03 ENCOUNTER — Other Ambulatory Visit: Payer: Self-pay

## 2018-03-03 NOTE — Patient Outreach (Signed)
Michele [name]  03/03/2018  Michele [name] April 04, 1929 [id number]   Referral Date: 03/02/2018 Referral Source: ED referral Referral Reason: Low social support   Outreach Attempt: no answer.  HIPAA compliant voice message left.   Plan: RN CM will attempt patient again within 4 business days and send letter.   Jone Baseman, RN, MSN [facility] [place] [name] Care [name] Coordinator Direct Line [phone] Toll Free: [phone]  Fax: [phone]

## 2018-03-04 ENCOUNTER — Other Ambulatory Visit: Payer: Self-pay

## 2018-03-04 NOTE — Patient Outreach (Signed)
[place] [facility]) Care Management  03/04/2018  Michele [name] [birth year] [id number]   Referral Date: 03/02/2018 Referral Source: ED referral Referral Reason: Low social support   Outreach Attempt: no answer.  HIPAA compliant voice message left.  Telephone call to  Daughter Michele [name], no answer. HIPAA compliant voice message left.   Plan: RN CM will attempt patient again within 4 business days.  Jone Baseman, RN, MSN [place] Management Care Management Coordinator Direct Line [phone] Cell [phone] Toll Free: [phone]  Fax: [phone]

## 2018-03-05 ENCOUNTER — Other Ambulatory Visit: Payer: Self-pay

## 2018-03-05 NOTE — Addendum Note (Signed)
Addended by: Jone Baseman on: 03/05/2018 10:44 AM   Modules accepted: Orders

## 2018-03-05 NOTE — Patient Outreach (Addendum)
[facility] [facility]) Care Management  03/05/2018  Michele [name] December 26, 1929 [id number]   Referral Date:03/02/2018 Referral Source:ED referral Referral Reason:Low social support   Outreach Attempt: spoke with patient. She is able to verify HIPAA.  Advised patient on reason for referral.  Patient shattered in thoughts when questions asked. Patient states she lives alone with her family mainly in [place]. Patient gives permission to speak with [name].  Advised her that CM would call after speaking with patient. She verbalized understanding.  Patient acknowledges the one fall but states she is ok and really does not have any other health issues.  Patient notes that she does have ICD in and has hypothyroidism.  Patient states she takes her medications as prescribed and has no concerns with her medications.  Patient has not seen her PCP in a while but due to see him on Monday and daughter to accompany her.    Discussed [facility] services and support with patient. However, patient refused.  Advised patient that I would discuss information with her daughter and maybe readdress needs and services again.  She is agreeable.   RN CM called daughter [name].  No answer.  HIPAA compliant voice message left.     Plan: RN CM will wait return call from daughter.  Update: Incoming call from daughter [name].  She is able to verify HIPAA.  Discussed patient situation and patient safety. She and her brother really want patient to come to live with one of them but has refused.  Daughter wants to look into resources for care/companion to come in the home a few days a week and resources for transportation.  She is a Education officer, museum and lives in [place] and will be coming this weekend so that she can take patient to MD appointment on Monday at 3:20 pm.  Advised her that CM would refer to social worker to discuss needs further and any other needs.  She is agreeable.     Plan: RN CM will refer to  social work for resources and assess for safety due to some ? Dementia.     Jone Baseman, RN, MSN [place] [facility] Care Management Coordinator Direct Line [phone] Cell [phone] Toll Free: [phone]  Fax: [phone]

## 2018-03-08 ENCOUNTER — Encounter: Payer: Self-pay | Admitting: Internal Medicine

## 2018-03-08 ENCOUNTER — Ambulatory Visit (INDEPENDENT_AMBULATORY_CARE_PROVIDER_SITE_OTHER): Payer: Medicare Other | Admitting: Internal Medicine

## 2018-03-08 VITALS — BP 108/68 | HR 66 | Temp 97.9°F | Ht 63.0 in | Wt 100.0 lb

## 2018-03-08 DIAGNOSIS — E890 Postprocedural hypothyroidism: Secondary | ICD-10-CM

## 2018-03-08 DIAGNOSIS — Z0000 Encounter for general adult medical examination without abnormal findings: Secondary | ICD-10-CM | POA: Diagnosis not present

## 2018-03-08 MED ORDER — LEVOTHYROXINE SODIUM 50 MCG PO TABS: 50.0000 ug | ORAL_TABLET | Freq: Every day | ORAL | 3 refills | Status: DC

## 2018-03-08 NOTE — Assessment & Plan Note (Signed)
With wt loss, for f/u tft's

## 2018-03-08 NOTE — Patient Instructions (Signed)
Please continue all other medications as before, and refills will be done when we determine the name of your mailin pharmacy  Please have the pharmacy call with any other refills you may need.  Please continue your efforts at being more active, low cholesterol diet, and weight control.  You are otherwise up to date with prevention measures today.  Please keep your appointments with your specialists as you may have planned  Please return in 6 months, or sooner if needed, with Lab testing done 3-5 days before

## 2018-03-08 NOTE — Assessment & Plan Note (Signed)

## 2018-03-08 NOTE — Progress Notes (Signed)
Subjective:    Patient ID: Michele [name], female    DOB: Dec 31, 1929, 85 y.o.   MRN: [id number]  HPI   Here for wellness and f/u;  Overall doing ok;  Pt denies Chest pain, worsening SOB, DOE, wheezing, orthopnea, PND, worsening LE edema, palpitations, dizziness or syncope.  Pt denies neurological change such as new headache, facial or extremity weakness.  Pt denies polydipsia, polyuria, or low sugar symptoms. Pt states overall good compliance with treatment and medications, good tolerability, and has been trying to follow appropriate diet.  Pt denies worsening depressive symptoms, suicidal ideation or panic. No fever, night sweats, wt loss, loss of appetite, or other constitutional symptoms.  Pt states good ability with ADL's, has low fall risk, home safety reviewed and adequate, no other significant changes in hearing or vision, and only occasionally active with exercise.  Fell last wk, with coccyx pain mild persistent, seen in ED with negative ct head ct cervical, LS spine pelvis and tailbone films negative for acute. No further falls. Wt Readings from Last 3 Encounters:  03/08/18 100 lb (45.4 kg)  05/13/17 120 lb (54.4 kg)  03/17/17 120 lb 4 oz (54.5 kg)   Past Medical History:  Diagnosis Date  . Atrial fibrillation ([place])    NEG STRESS CARDIOLITE  . C. difficile colitis   . Cardiomyopathy    RATE RELATED  . CHF (congestive heart failure) ([place])   . Diverticulitis    PMH of X 2  . Diverticulosis    DIVERTICULITIS  . Gilbert's syndrome   . Hx of colonic polyp 2011   Dr Fuller Plan  . Hyperlipidemia    NMR LIOPROFILE LDL 234(3206/2234)HDL 37,TG86  . Renal insufficiency 12/09/2015  . Thyroid disease    HYPOTHYROIDISM...THYROID NODULES   Past Surgical History:  Procedure Laterality Date  . ABDOMINAL HYSTERECTOMY  1973  . APPENDECTOMY  1956  . BIOPSY THYROID     SINGLE NODULE  . BREAST BIOPSY  1997  . CARDIAC DEFIBRILLATOR PLACEMENT    . CHOLECYSTECTOMY  1972  . colonoscopy with  polypectomy  2011  . EP IMPLANTABLE DEVICE N/A 11/21/2015   Procedure: BIV ICD Generator Changeout;  Surgeon: Deboraha Sprang, MD;  Location: [facility] CV LAB;  Service: Cardiovascular;  Laterality: N/A;  . ICD....02/2008    . LEAD REVISION N/A 08/21/2011   Procedure: LEAD REVISION;  Surgeon: Deboraha Sprang, MD;  Location[facility] CATH LAB;  Service: Cardiovascular;  Laterality: N/A;  . PACEMAKER PLACEMENT     10/2006  . ROTATOR CUFF REPAIR     x2  . THROIDECTOMY 12/08     benign    reports that she has never smoked. She has never used smokeless tobacco. She reports that she does not drink alcohol or use drugs. family history includes Breast cancer in her sister; Heart attack (age of onset: 27) in her brother; Heart attack (age of onset: 64) in her mother; Lung disease in her father; Ulcerative colitis in her son; Vasculitis in her sister. Allergies  Allergen Reactions  . Cephalexin Hives  . Erythromycin Swelling and Other (See Comments)    REACTION: swollen and sore mouth  . Terbinafine Hcl     Rash   . Colchicine Other (See Comments)    REACTION: violent diarrhea  . Crestor [Rosuvastatin Calcium] Other (See Comments)    Arm pain  . Rosuvastatin Other (See Comments)    REACTION: upper arm pain bilaterally  . Vytorin [Ezetimibe-Simvastatin] Other (See Comments)  Nightmares with generic   Current Outpatient Medications on File Prior to Visit  Medication Sig Dispense Refill  . cholecalciferol (VITAMIN D) 1000 UNITS tablet Take 1,000 Units by mouth daily.    Marland Kitchen warfarin (COUMADIN) 5 MG tablet Take 1 daily except take 1 1/2 tablets on Mondays orTAKE AS DIRECTED.  2 week supply.  Patient must have INR checked before further re-fills will be approved. 15 tablet 0   No current facility-administered medications on file prior to visit.    Review of Systems Constitutional: Negative for other unusual diaphoresis, sweats, appetite or weight changes HENT: Negative for other worsening hearing  loss, ear pain, facial swelling, mouth sores or neck stiffness.   Eyes: Negative for other worsening pain, redness or other visual disturbance.  Respiratory: Negative for other stridor or swelling Cardiovascular: Negative for other palpitations or other chest pain  Gastrointestinal: Negative for worsening diarrhea or loose stools, blood in stool, distention or other pain Genitourinary: Negative for hematuria, flank pain or other change in urine volume.  Musculoskeletal: Negative for myalgias or other joint swelling.  Skin: Negative for other color change, or other wound or worsening drainage.  Neurological: Negative for other syncope or numbness. Hematological: Negative for other adenopathy or swelling Psychiatric/Behavioral: Negative for hallucinations, other worsening agitation, SI, self-injury, or new decreased concentration All other system neg per pt    Objective:   Physical Exam BP 108/68   Pulse 66   Temp 97.9 F (36.6 C) (Oral)   Ht 5\' 3"  (1.6 m)   Wt 100 lb (45.4 kg)   SpO2 97%   BMI 17.71 kg/m   VS noted,  Constitutional: Pt is oriented to person, place, and time. Appears well-developed and well-nourished, in no significant distress and comfortable Head: Normocephalic and atraumatic  Eyes: Conjunctivae and EOM are normal. Pupils are equal, round, and reactive to light Right Ear: External ear normal without discharge Left Ear: External ear normal without discharge Nose: Nose without discharge or deformity Mouth/Throat: Oropharynx is without other ulcerations and moist  Neck: Normal range of motion. Neck supple. No JVD present. No tracheal deviation present or significant neck LA or mass Cardiovascular: Normal rate, regular rhythm, normal heart sounds and intact distal pulses.   Pulmonary/Chest: WOB normal and breath sounds without rales or wheezing  Abdominal: Soft. Bowel sounds are normal. NT. No HSM  Musculoskeletal: Normal range of motion. Exhibits no  edema Lymphadenopathy: Has no other cervical adenopathy.  Neurological: Pt is alert and oriented to person, place, and time. Pt has normal reflexes. No cranial nerve deficit. Motor grossly intact, Gait intact Skin: Skin is warm and dry. No rash noted or new ulcerations Psychiatric:  Has normal mood and affect. Behavior is normal without agitation No other exam findings Lab Results  Component Value Date   WBC 6.4 03/02/2018   HGB 14.5 03/02/2018   HCT 42.5 03/02/2018   PLT 250 03/02/2018   GLUCOSE 95 03/02/2018   CHOL 242 (H) 01/23/2017   TRIG 63 01/23/2017   HDL 45 01/23/2017   LDLDIRECT 163.7 06/23/2011   LDLCALC 184 (H) 01/23/2017   ALT 17 03/02/2018   AST 28 03/02/2018   NA 137 03/02/2018   K 3.6 03/02/2018   CL 99 03/02/2018   CREATININE 1.48 (H) 03/02/2018   BUN 26 (H) 03/02/2018   CO2 27 03/02/2018   TSH 6.49 (H) 10/21/2016   INR 1.44 03/02/2018   HGBA1C 5.1 01/23/2017       Assessment & Plan:

## 2018-03-09 ENCOUNTER — Telehealth: Payer: Self-pay

## 2018-03-09 ENCOUNTER — Other Ambulatory Visit (INDEPENDENT_AMBULATORY_CARE_PROVIDER_SITE_OTHER): Payer: Medicare Other

## 2018-03-09 ENCOUNTER — Other Ambulatory Visit: Payer: Self-pay | Admitting: Internal Medicine

## 2018-03-09 DIAGNOSIS — E890 Postprocedural hypothyroidism: Secondary | ICD-10-CM | POA: Diagnosis not present

## 2018-03-09 LAB — TSH: TSH: 13.9 u[IU]/mL — ABNORMAL HIGH (ref 0.35–4.50)

## 2018-03-09 LAB — T4, FREE: Free T4: 1.11 ng/dL (ref 0.60–1.60)

## 2018-03-09 MED ORDER — LEVOTHYROXINE SODIUM 75 MCG PO TABS: 75.0000 ug | ORAL_TABLET | Freq: Every day | ORAL | 3 refills | Status: DC

## 2018-03-09 NOTE — Telephone Encounter (Signed)
Pt has been informed and will have someone bring her.

## 2018-03-09 NOTE — Telephone Encounter (Signed)
-----   Message from Biagio Borg, MD sent at 03/08/2018  8:32 PM EST ----- Regarding: f/u thyroid testing [place], to please ask pt to return for thyroid testing, as I realized after she left that these tests have not been done in the past yr, and her wt loss can be associated with too much thyroid medication, so we need to check,, so pt just goes to lab.

## 2018-03-10 ENCOUNTER — Other Ambulatory Visit: Payer: Self-pay

## 2018-03-10 ENCOUNTER — Telehealth: Payer: Self-pay

## 2018-03-10 ENCOUNTER — Ambulatory Visit (INDEPENDENT_AMBULATORY_CARE_PROVIDER_SITE_OTHER): Payer: Medicare Other

## 2018-03-10 DIAGNOSIS — I428 Other cardiomyopathies: Secondary | ICD-10-CM

## 2018-03-10 DIAGNOSIS — I4891 Unspecified atrial fibrillation: Secondary | ICD-10-CM

## 2018-03-10 NOTE — Telephone Encounter (Signed)
Pt has viewed results via MyChart  

## 2018-03-10 NOTE — Telephone Encounter (Signed)
-----   Message from Biagio Borg, MD sent at 03/09/2018  5:08 PM EST ----- Left message on MyChart, pt to cont same tx except  The test results show that your current treatment is OK, except the TSH is elevated.  We need to increase the levothyroxine from 50 to 75 mcg per day I will send a prescription to [facility] and you should hear from the office as well.Redmond Baseman to please inform pt, I will do rx

## 2018-03-10 NOTE — Patient Outreach (Signed)
[facility] [facility]) Care Management  03/10/2018  Michele [name] 1929/09/24 [id number]  Successful outreach to the patients [name] as indicated in patient referral for community resources. Patient HIPAA identifiers confirmed. [name] reports she would like to have her mother move closer to her but the patient has yet to agree. At this time [name] is interested in resources for transportation as well as caregivers who would assist with grocery shopping. BSW educated [name] on the patients transportation benefit under [employer] [facility]) to assist with physician appointments. [name] is interested in receiving information on this program. [name] denies the need for other resources such as [facility] or [facility].   [name] reports the only other place the patient travels is the grocery store. Although Clarise Cruz is aware of options for grocery delivery she reports the patient enjoys visiting the store. BSW offered to provide a list of caregiver agencies for hire that could assist with patient engagement and assistance with errands. [name] is agreeable to receiving this list and requests BSW e-mail information rather than mailing. At this time [name] is not interested in receiving alternate resources such as placement options or senior center programs due to concerns of the patients willingness to participate. [name] further reports that when her mother is agreeable to placement she will choose a community close to her home in [place].  BSW has sent a secure e-mail with requested resources to [name] at [email or web address]. BSW will outreach in the next two weeks to confirm no other resources are needed prior to performing a case closure.  Daneen Schick, BSW, CDP [facility] [phone]

## 2018-03-11 NOTE — Progress Notes (Signed)
Remote ICD transmission.   

## 2018-03-12 ENCOUNTER — Encounter: Payer: Self-pay | Admitting: Cardiology

## 2018-03-14 LAB — CUP PACEART REMOTE DEVICE CHECK
Battery Remaining Longevity: 23 mo
Brady Statistic AP VP Percent: 0 %
Brady Statistic AP VS Percent: 0 %
Brady Statistic AS VP Percent: 99.44 %
Brady Statistic AS VS Percent: 0.56 %
Brady Statistic RA Percent Paced: 0 %
Brady Statistic RV Percent Paced: 99.58 %
Date Time Interrogation Session: 20200122073323
HighPow Impedance: 45 Ohm
HighPow Impedance: 81 Ohm
Implantable Lead Implant Date: 20080908
Implantable Lead Implant Date: 20100121
Implantable Lead Implant Date: 20100121
Implantable Lead Implant Date: 20130704
Implantable Lead Location: 753858
Implantable Lead Location: 753859
Implantable Lead Location: 753860
Implantable Lead Model: 4196
Implantable Lead Model: 5076
Implantable Lead Model: 7120
Implantable Pulse Generator Implant Date: 20171004
Lead Channel Impedance Value: 342 Ohm
Lead Channel Impedance Value: 456 Ohm
Lead Channel Impedance Value: 475 Ohm
Lead Channel Impedance Value: 532 Ohm
Lead Channel Impedance Value: 532 Ohm
Lead Channel Impedance Value: 874 Ohm
Lead Channel Pacing Threshold Amplitude: 0.625 V
Lead Channel Pacing Threshold Amplitude: 1.75 V
Lead Channel Pacing Threshold Pulse Width: 0.8 ms
Lead Channel Sensing Intrinsic Amplitude: 1.25 mV
Lead Channel Sensing Intrinsic Amplitude: 1.25 mV
Lead Channel Setting Pacing Amplitude: 2 V
Lead Channel Setting Pacing Pulse Width: 0.4 ms
Lead Channel Setting Pacing Pulse Width: 0.8 ms
Lead Channel Setting Sensing Sensitivity: 0.3 mV
MDC IDC LEAD LOCATION: 753860
MDC IDC MSMT BATTERY VOLTAGE: 2.93 V
MDC IDC MSMT LEADCHNL RV PACING THRESHOLD PULSEWIDTH: 0.4 ms
MDC IDC SET LEADCHNL LV PACING AMPLITUDE: 2.75 V

## 2018-03-22 ENCOUNTER — Other Ambulatory Visit: Payer: Self-pay

## 2018-03-22 ENCOUNTER — Other Ambulatory Visit: Payer: Self-pay | Admitting: *Deleted

## 2018-03-22 NOTE — Patient Outreach (Signed)
[place] [facility]) Care Management  03/22/2018  Michele [name] 1929/06/20 [id number]  Successful outreach to the patients daughter Michele [name] to confirm receipt of resources. Patient HIPAA identifiers confirmed.  Ms. Michele [name] reports she has received resource information. Ms. Michele [name] further reports she has been visiting the patient weekly in hopes of checking in on her more. The patient continues to refuse placement and/or moving closer to family. Ms. Michele [name] reports she and her brother have "diabled" the patients car and taken the keys. She feels the patient is "safer" in regards to patient driving. Unfortunately, the patient is not willing to access her [facility] transportation benefit or use an in home caregiver so Ms. Michele [name] has been attempting to provide transportation to appointments as well as assist with grocery shopping.   Ms. Michele [name] is interested in learning more about logistics of having the patients declared incompetent and making her move closer to family. BSW explained that unless the patient were to move to a secured locked area such as a memory care, the patient could still leave regardless of competency. Ms. Michele [name] reports she does not want to cause distress in her relationship with her mom but she is concerned with her safety. "Instead of using a skillet she cooks with a frying pan on top of the toaster". BSW discussed outreaching the patient directly to arrange a home visit to discuss community resource needs she may be interested in. Ms. Michele [name] is in agreement with this.  BSW outreached CSW Eduard Clos to discuss patient care needs. BSW will plan to involve CSW due to complexity of this case.  Daneen Schick, BSW, CDP [facility] [phone]

## 2018-03-22 NOTE — Patient Outreach (Signed)
[place] [facility]) Care Management  03/22/2018  Michele [name] 12-01-1929 [id number]   CSW made contact wth pt today by phone. CSW introduced self and confirmed pt's identity. Pt agrees to CSW planning a home visit tomorrow for assessment and to determine what resources, services and support may be of benefit to her.  CSW will plan a home visit tomorrow afternoon.   Eduard Clos, MSW, [place] Worker  [place] [phone]

## 2018-03-23 ENCOUNTER — Ambulatory Visit: Payer: Self-pay | Admitting: *Deleted

## 2018-03-24 ENCOUNTER — Other Ambulatory Visit: Payer: Self-pay | Admitting: *Deleted

## 2018-03-24 NOTE — Patient Outreach (Signed)
[place] [facility]) Care Management  03/24/2018  GENETTA FIERO October 06, 1929 [id number]   CSW spoke with pt by phone today to reschedule visit. She apologized for forgetting and agrees to a visit later this week. CSW will plan a call to confirm before home visit planned for Friday.   Eduard Clos, MSW, [place] Worker  [place] [phone]

## 2018-03-26 ENCOUNTER — Ambulatory Visit: Payer: Self-pay | Admitting: *Deleted

## 2018-03-26 ENCOUNTER — Other Ambulatory Visit: Payer: Self-pay | Admitting: *Deleted
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
## 2018-03-29 ENCOUNTER — Other Ambulatory Visit: Payer: Self-pay

## 2018-03-29 NOTE — Patient Outreach (Signed)
Care coordination:  Phone call from Eduard Clos LCSW to review case.  PLAN: Joint home visit planned for 04/05/2018.  Tomasa Rand, RN, BSN, CEN [facility] [employer] [phone]

## 2018-03-30 ENCOUNTER — Other Ambulatory Visit: Payer: Self-pay | Admitting: Internal Medicine

## 2018-03-31 ENCOUNTER — Ambulatory Visit: Payer: Self-pay | Admitting: *Deleted

## 2018-04-02 ENCOUNTER — Other Ambulatory Visit: Payer: Self-pay | Admitting: *Deleted

## 2018-04-02 NOTE — Patient Outreach (Signed)
[place] [facility]) Care Management  04/02/2018  Michele [name] 14-Dec-1929 [id number]   CSW spoke with pt's daughter by phone to confirm plans for home visit on Monday with pt, daughter, [facility] RNCM and self, CSW.      Eduard Clos, MSW, [place] Worker  [place] [phone]

## 2018-04-05 ENCOUNTER — Other Ambulatory Visit: Payer: Self-pay

## 2018-04-05 ENCOUNTER — Other Ambulatory Visit: Payer: Self-pay | Admitting: *Deleted

## 2018-04-05 ENCOUNTER — Other Ambulatory Visit: Payer: Self-pay | Admitting: Internal Medicine

## 2018-04-05 ENCOUNTER — Encounter: Payer: Self-pay | Admitting: *Deleted

## 2018-04-05 DIAGNOSIS — M19011 Primary osteoarthritis, right shoulder: Secondary | ICD-10-CM | POA: Diagnosis not present

## 2018-04-05 DIAGNOSIS — M5030 Other cervical disc degeneration, unspecified cervical region: Secondary | ICD-10-CM | POA: Diagnosis not present

## 2018-04-05 MED ORDER — LEVOTHYROXINE SODIUM 75 MCG PO TABS: 75.0000 ug | ORAL_TABLET | Freq: Every day | ORAL | 3 refills | Status: DC

## 2018-04-05 NOTE — Telephone Encounter (Signed)
Copied from [place]. Topic: Quick Communication - Rx Refill/Question >> Apr 05, 2018  9:47 AM Alfredia Ferguson R wrote: Medication: levothyroxine (SYNTHROID, LEVOTHROID) 75 MCG tablet  Has the patient contacted their pharmacy? Yes Preferred Pharmacy (with phone number or street name): [facility] [place] [address] [phone] (Phone) [phone] (Fax)    Agent: Please be advised that RX refills may take up to 3 business days. We ask that you follow-up with your pharmacy.
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
## 2018-04-06 ENCOUNTER — Telehealth: Payer: Self-pay | Admitting: General Practice

## 2018-04-06 NOTE — Patient Outreach (Signed)
[place] [facility]) Care Management  [facility] Social Work  04/06/2018  Michele [name] 1929-12-05 [id number]  Subjective:  Pt with recent fall, vision and cognitive impairments and no local family.   Objective: [facility] CSW to assist patient and family with community based resources to aide in their well-being, quality of life and overall safety and needs.    Encounter Medications:  Outpatient Encounter Medications as of 04/05/2018  Medication Sig  . cholecalciferol (VITAMIN D) 1000 UNITS tablet Take 1,000 Units by mouth daily.  Marland Kitchen warfarin (COUMADIN) 5 MG tablet Take 1 tablet daily except 1 1/2 tablets on Tuesdays.  30 day  Pt must have INR checked before further re-fills can be given. (Patient taking differently: 5 mg. Take 1 tablet daily)  . [DISCONTINUED] levothyroxine (SYNTHROID, LEVOTHROID) 75 MCG tablet Take 1 tablet (75 mcg total) by mouth daily.   No facility-administered encounter medications on file as of 04/05/2018.     Functional Status:  In your present state of health, do you have any difficulty performing the following activities: 04/05/2018 04/05/2018  Hearing? Y N  Vision? Y Y  Difficulty concentrating or making decisions? Michele [name]  Walking or climbing stairs? N -  Dressing or bathing? N -  Doing errands, shopping? N -  Preparing Food and eating ? Y -  Using the Toilet? N -  In the past six months, have you accidently leaked urine? N -  Do you have problems with loss of bowel control? N -  Managing your Medications? Y -  Managing your Finances? Y -  Comment son manages bills -  Housekeeping or managing your Housekeeping? Y -  Some recent data might be hidden    Fall/Depression Screening:  PHQ 2/9 Scores 04/05/2018 03/08/2018 03/05/2018 05/13/2017 09/03/2016 07/13/2015 05/16/2014  PHQ - 2 Score 2 0 0 2 0 1 0  PHQ- 9 Score 8 - - 3 - - -    Assessment:  CSW met with pt and her daughter in her home. Daughter, Michele [name], visiting from [place], [place].  Pt was pleasant and  oriented however did show some signs of memory delays as well as possible visual hallucinations and paranoia.  CSW encouraged daughter to have pt evaluated by her PCP for Cognitive Impairment diagnosing and possible treatment options.   Pt fairly adamant about staying in her home and not moving to her daughters; stating she doesn't want to be in the way".  She also is unrealistic about concerns related to her vision, memory and thinking people are living in her home, stealing from her and out in her yard.   CSW and RNCM spent an extended time with pt and daughter assessing, voicing concerns and offering support, services and possible options for them to consider.    Plan:   [facility] CM Care Plan Problem One     Most Recent Value  Care Plan Problem One  Patient with cognitive and vision impairment and lives alone.   Role Documenting the Problem One  Clinical Social Worker  Care Plan for Problem One  Active  [facility] Long Term Goal   Patient will have improved support, services and safe setting in the next 60 days.  [Pt and family will report improved support and services. ]  [facility] Long Term Goal Start Date  04/05/18  Interventions for Problem One Long Term Goal  CSW discussed at length concerns related to her safety as well as inadequate support and safety issues in the home where she lives alone. Pt  presented with significant cognitive delays including some signs of hallucinations, paranoia and depression.   [facility] CM Short Term Goal #1   Pt and family will review resources discussed and mailed to them in the next 30 days.   [facility] CM Short Term Goal #1 Start Date  04/05/18  Interventions for Short Term Goal #1  CSW discussed and providing info on multiple agency/support services as well as alternative housing options.   [facility] CM Short Term Goal #2   Pt and family will report plans in place for adequate in home support in the next 20 days.   [facility] CM Short Term Goal #2 Start Date  04/05/18  Interventions for Short  Term Goal #2  CSW discussed options including long term care facility, in home private duty care and moving closer to family.   [facility] CM Short Term Goal #3  Pt and family will report safety measures in place within the next 30 days.   [facility] CM Short Term Goal #3 Start Date  04/05/18  Interventions for Short Tern Goal #3  CSW discussed concerns related to her fall and living alone. As well, concerns related to living alone with her fall history, vision and STM delays.  CSW stressed concens related to her living alone and the unsafe setting along with being alone.         CSW plans to contact pt and daughter at the end of the week for updates on their discussions, needs and further planning.   Eduard Clos, MSW, [place] Worker  [place] [phone]

## 2018-04-06 NOTE — Telephone Encounter (Signed)
Incoming call from patient's daughter, Clarise Cruz.  INR appointment made for 04/16/2018 @ 1:45.

## 2018-04-09 ENCOUNTER — Ambulatory Visit: Payer: Self-pay | Admitting: *Deleted

## 2018-04-09 ENCOUNTER — Other Ambulatory Visit: Payer: Self-pay | Admitting: *Deleted

## 2018-04-09 NOTE — Patient Outreach (Signed)
[place] [facility]) Care Management  04/09/2018  Michele [name] Nov 11, 1929 [id number]   CSW attempted to reach pt/family today for updates but was unsuccessful. CSW left voice message for daughter, [name], and will try again next week if no return call is received.   Eduard Clos, MSW, [place] Worker  [place] [phone]

## 2018-04-13 ENCOUNTER — Ambulatory Visit: Payer: Self-pay | Admitting: *Deleted

## 2018-04-13 ENCOUNTER — Other Ambulatory Visit: Payer: Self-pay | Admitting: *Deleted

## 2018-04-13 ENCOUNTER — Encounter: Payer: Self-pay | Admitting: *Deleted

## 2018-04-13 NOTE — Patient Outreach (Signed)
[place] [facility]) Care Management  04/13/2018  Michele [name] 1929/10/30 [id number]   CSW attempted to reach pt's daughter today by phone for updates on their plans, progress and pt's condition. CSW was unable to reach daughter and left a voice message for return call. CSW will mail pt an unsuccessful outreach letter and attempt to reach again in the nxt 6-7 days.   Eduard Clos, MSW, [place] Worker  [place] [phone]

## 2018-04-16 ENCOUNTER — Ambulatory Visit: Payer: Medicare Other

## 2018-04-20 ENCOUNTER — Other Ambulatory Visit: Payer: Self-pay | Admitting: *Deleted

## 2018-04-20 ENCOUNTER — Ambulatory Visit: Payer: Self-pay | Admitting: *Deleted

## 2018-04-20 NOTE — Patient Outreach (Signed)
[facility] [facility]) Care Management  04/20/2018  MARYLEN ZUK 1929-12-09 [id number]   CSW attempted to reach pt and pt's daughter by phone again today but was unsuccessful. CSW left voice messages for pt and daughter and will plan case closure if no return call is received.      Eduard Clos, MSW, [place] Worker  [place] [phone]

## 2018-04-22 ENCOUNTER — Other Ambulatory Visit: Payer: Self-pay | Admitting: *Deleted

## 2018-04-22 NOTE — Patient Outreach (Signed)
[facility]) Care Management  04/22/2018  Michele [name] 1929-05-07 [id number]   CSW has been unable to reach pt or family after 3 or more attempts and will plan case closure. CSW will advise PCP and [facility] team of above.   Eduard Clos, MSW, [place] Worker  [place] [phone]

## 2018-05-06 ENCOUNTER — Telehealth: Payer: Self-pay | Admitting: Internal Medicine

## 2018-05-06 DIAGNOSIS — E039 Hypothyroidism, unspecified: Secondary | ICD-10-CM

## 2018-05-06 NOTE — Telephone Encounter (Signed)
Copied from [place] [id number]. Topic: General - Other >> May 06, 2018  7:08 PM Oneta Rack wrote: Relation to pt: self  Call back [place]: [facility] [place], [address] [phone] (Phone) [phone] (Fax)  Reason for call: Patient advised the pharmacist she should be taking 88 MCG levothyroxine (SYNTHROID, LEVOTHROID) instead of 75 MCG tablet. Therefore the pharmacy cancelled prescription. Patient in need of clarity regarding the dosage and pharmacy requesting a new script. Patient states she has 1 pill left, please advise

## 2018-05-07 ENCOUNTER — Other Ambulatory Visit: Payer: Self-pay

## 2018-05-07 ENCOUNTER — Ambulatory Visit (INDEPENDENT_AMBULATORY_CARE_PROVIDER_SITE_OTHER): Payer: Medicare Other | Admitting: General Practice

## 2018-05-07 DIAGNOSIS — Z5181 Encounter for therapeutic drug level monitoring: Secondary | ICD-10-CM

## 2018-05-07 DIAGNOSIS — I4891 Unspecified atrial fibrillation: Secondary | ICD-10-CM

## 2018-05-07 LAB — POCT INR: INR: 5.7 — AB (ref 2.0–3.0)

## 2018-05-07 MED ORDER — LEVOTHYROXINE SODIUM 75 MCG PO TABS: 75.0000 ug | ORAL_TABLET | Freq: Every day | ORAL | 3 refills | Status: DC

## 2018-05-07 NOTE — Telephone Encounter (Addendum)
No this was an older dose last rx to pt in 2018  Since then she has been on multiple strengths as her testing has incdicated  Last TSH was jan 2020 where, after being reduced by too much from higher doses in the past, she was increased again from her then 50 mcg to 75 mcg per day  I will resend the rx for 75  Please ask pt to go to LAB only in 4 weeks to recheck her thyroid testing

## 2018-05-07 NOTE — Patient Instructions (Addendum)
Pre visit review using our clinic review tool, if applicable. No additional management support is needed unless otherwise documented below in the visit note.  Change dosage and take 1 tablet daily except 1/2 tablet on Mondays and Fridays.  Re-check in 2 weeks.

## 2018-05-07 NOTE — Telephone Encounter (Signed)
Pt's daughter has been informed and expressed understanding.  

## 2018-05-07 NOTE — Addendum Note (Signed)
Addended by: Biagio Borg on: 05/07/2018 08:08 AM   Modules accepted: Orders

## 2018-05-18 ENCOUNTER — Emergency Department (HOSPITAL_COMMUNITY): Payer: Medicare Other

## 2018-05-18 ENCOUNTER — Observation Stay (HOSPITAL_COMMUNITY)
Admission: EM | Admit: 2018-05-18 | Discharge: 2018-05-19 | Disposition: A | Discharge status: Skilled Nursing Facility | Payer: Medicare Other | Attending: Family Medicine | Admitting: Family Medicine

## 2018-05-18 ENCOUNTER — Other Ambulatory Visit: Payer: Self-pay

## 2018-05-18 ENCOUNTER — Encounter (HOSPITAL_COMMUNITY): Payer: Self-pay | Admitting: Emergency Medicine

## 2018-05-18 DIAGNOSIS — R2681 Unsteadiness on feet: Secondary | ICD-10-CM | POA: Diagnosis not present

## 2018-05-18 DIAGNOSIS — E876 Hypokalemia: Secondary | ICD-10-CM | POA: Diagnosis present

## 2018-05-18 DIAGNOSIS — Z7901 Long term (current) use of anticoagulants: Secondary | ICD-10-CM | POA: Diagnosis not present

## 2018-05-18 DIAGNOSIS — M25551 Pain in right hip: Secondary | ICD-10-CM | POA: Diagnosis not present

## 2018-05-18 DIAGNOSIS — I429 Cardiomyopathy, unspecified: Secondary | ICD-10-CM | POA: Insufficient documentation

## 2018-05-18 DIAGNOSIS — Z79890 Hormone replacement therapy (postmenopausal): Secondary | ICD-10-CM | POA: Insufficient documentation

## 2018-05-18 DIAGNOSIS — S0990XA Unspecified injury of head, initial encounter: Secondary | ICD-10-CM | POA: Diagnosis not present

## 2018-05-18 DIAGNOSIS — W19XXXA Unspecified fall, initial encounter: Secondary | ICD-10-CM | POA: Diagnosis not present

## 2018-05-18 DIAGNOSIS — E890 Postprocedural hypothyroidism: Secondary | ICD-10-CM | POA: Diagnosis not present

## 2018-05-18 DIAGNOSIS — E871 Hypo-osmolality and hyponatremia: Secondary | ICD-10-CM | POA: Diagnosis present

## 2018-05-18 DIAGNOSIS — I4891 Unspecified atrial fibrillation: Secondary | ICD-10-CM | POA: Diagnosis present

## 2018-05-18 DIAGNOSIS — K5730 Diverticulosis of large intestine without perforation or abscess without bleeding: Secondary | ICD-10-CM | POA: Insufficient documentation

## 2018-05-18 DIAGNOSIS — K449 Diaphragmatic hernia without obstruction or gangrene: Secondary | ICD-10-CM | POA: Diagnosis not present

## 2018-05-18 DIAGNOSIS — S32591A Other specified fracture of right pubis, initial encounter for closed fracture: Principal | ICD-10-CM | POA: Insufficient documentation

## 2018-05-18 DIAGNOSIS — M542 Cervicalgia: Secondary | ICD-10-CM | POA: Diagnosis not present

## 2018-05-18 DIAGNOSIS — M25572 Pain in left ankle and joints of left foot: Secondary | ICD-10-CM | POA: Diagnosis not present

## 2018-05-18 DIAGNOSIS — R911 Solitary pulmonary nodule: Secondary | ICD-10-CM | POA: Diagnosis not present

## 2018-05-18 DIAGNOSIS — R52 Pain, unspecified: Secondary | ICD-10-CM | POA: Diagnosis not present

## 2018-05-18 DIAGNOSIS — Z8249 Family history of ischemic heart disease and other diseases of the circulatory system: Secondary | ICD-10-CM | POA: Insufficient documentation

## 2018-05-18 DIAGNOSIS — E785 Hyperlipidemia, unspecified: Secondary | ICD-10-CM | POA: Insufficient documentation

## 2018-05-18 DIAGNOSIS — S32599A Other specified fracture of unspecified pubis, initial encounter for closed fracture: Secondary | ICD-10-CM | POA: Diagnosis present

## 2018-05-18 DIAGNOSIS — Z95810 Presence of automatic (implantable) cardiac defibrillator: Secondary | ICD-10-CM | POA: Diagnosis not present

## 2018-05-18 DIAGNOSIS — W010XXA Fall on same level from slipping, tripping and stumbling without subsequent striking against object, initial encounter: Secondary | ICD-10-CM | POA: Insufficient documentation

## 2018-05-18 DIAGNOSIS — I509 Heart failure, unspecified: Secondary | ICD-10-CM | POA: Diagnosis not present

## 2018-05-18 DIAGNOSIS — M50223 Other cervical disc displacement at C6-C7 level: Secondary | ICD-10-CM | POA: Insufficient documentation

## 2018-05-18 DIAGNOSIS — Z79899 Other long term (current) drug therapy: Secondary | ICD-10-CM | POA: Diagnosis not present

## 2018-05-18 DIAGNOSIS — S32511A Fracture of superior rim of right pubis, initial encounter for closed fracture: Secondary | ICD-10-CM | POA: Diagnosis not present

## 2018-05-18 DIAGNOSIS — M8580 Other specified disorders of bone density and structure, unspecified site: Secondary | ICD-10-CM | POA: Diagnosis not present

## 2018-05-18 DIAGNOSIS — S32592A Other specified fracture of left pubis, initial encounter for closed fracture: Secondary | ICD-10-CM | POA: Diagnosis not present

## 2018-05-18 MED ORDER — FENTANYL CITRATE (PF) 100 MCG/2ML IJ SOLN
50.0000 ug | Freq: Once | INTRAMUSCULAR | Status: AC
  Administered 2018-05-18: 50 ug via INTRAVENOUS
  Filled 2018-05-18: qty 2

## 2018-05-18 NOTE — ED Notes (Signed)
Nurse starting IV and drawing labs. 

## 2018-05-18 NOTE — ED Triage Notes (Signed)
BIB [facility] from home with c/o of fall on her right hip. Per EMS deformity noted. Pt states she did hit her head but denies LOC. Pt is taking coumadin. Pt refused pain medication PTA.

## 2018-05-19 ENCOUNTER — Emergency Department (HOSPITAL_COMMUNITY): Payer: Medicare Other

## 2018-05-19 ENCOUNTER — Other Ambulatory Visit: Payer: Self-pay

## 2018-05-19 ENCOUNTER — Encounter (HOSPITAL_COMMUNITY): Payer: Self-pay | Admitting: Family Medicine

## 2018-05-19 DIAGNOSIS — M6281 Muscle weakness (generalized): Secondary | ICD-10-CM | POA: Diagnosis not present

## 2018-05-19 DIAGNOSIS — I4891 Unspecified atrial fibrillation: Secondary | ICD-10-CM | POA: Diagnosis not present

## 2018-05-19 DIAGNOSIS — R911 Solitary pulmonary nodule: Secondary | ICD-10-CM | POA: Diagnosis not present

## 2018-05-19 DIAGNOSIS — K449 Diaphragmatic hernia without obstruction or gangrene: Secondary | ICD-10-CM | POA: Diagnosis not present

## 2018-05-19 DIAGNOSIS — S32592A Other specified fracture of left pubis, initial encounter for closed fracture: Secondary | ICD-10-CM | POA: Diagnosis not present

## 2018-05-19 DIAGNOSIS — I429 Cardiomyopathy, unspecified: Secondary | ICD-10-CM | POA: Diagnosis not present

## 2018-05-19 DIAGNOSIS — E876 Hypokalemia: Secondary | ICD-10-CM | POA: Diagnosis not present

## 2018-05-19 DIAGNOSIS — I509 Heart failure, unspecified: Secondary | ICD-10-CM | POA: Diagnosis not present

## 2018-05-19 DIAGNOSIS — S32599A Other specified fracture of unspecified pubis, initial encounter for closed fracture: Secondary | ICD-10-CM | POA: Diagnosis present

## 2018-05-19 DIAGNOSIS — Z7401 Bed confinement status: Secondary | ICD-10-CM | POA: Diagnosis not present

## 2018-05-19 DIAGNOSIS — E785 Hyperlipidemia, unspecified: Secondary | ICD-10-CM | POA: Diagnosis not present

## 2018-05-19 DIAGNOSIS — R52 Pain, unspecified: Secondary | ICD-10-CM | POA: Diagnosis not present

## 2018-05-19 DIAGNOSIS — R2681 Unsteadiness on feet: Secondary | ICD-10-CM | POA: Diagnosis not present

## 2018-05-19 DIAGNOSIS — S32592S Other specified fracture of left pubis, sequela: Secondary | ICD-10-CM | POA: Diagnosis not present

## 2018-05-19 DIAGNOSIS — Z8249 Family history of ischemic heart disease and other diseases of the circulatory system: Secondary | ICD-10-CM | POA: Diagnosis not present

## 2018-05-19 DIAGNOSIS — E871 Hypo-osmolality and hyponatremia: Secondary | ICD-10-CM | POA: Diagnosis not present

## 2018-05-19 DIAGNOSIS — M8580 Other specified disorders of bone density and structure, unspecified site: Secondary | ICD-10-CM | POA: Diagnosis not present

## 2018-05-19 DIAGNOSIS — R008 Other abnormalities of heart beat: Secondary | ICD-10-CM | POA: Diagnosis not present

## 2018-05-19 DIAGNOSIS — S32591D Other specified fracture of right pubis, subsequent encounter for fracture with routine healing: Secondary | ICD-10-CM | POA: Diagnosis not present

## 2018-05-19 DIAGNOSIS — R262 Difficulty in walking, not elsewhere classified: Secondary | ICD-10-CM | POA: Diagnosis not present

## 2018-05-19 DIAGNOSIS — M2550 Pain in unspecified joint: Secondary | ICD-10-CM | POA: Diagnosis not present

## 2018-05-19 DIAGNOSIS — I480 Paroxysmal atrial fibrillation: Secondary | ICD-10-CM | POA: Diagnosis not present

## 2018-05-19 DIAGNOSIS — S32591A Other specified fracture of right pubis, initial encounter for closed fracture: Secondary | ICD-10-CM | POA: Diagnosis not present

## 2018-05-19 DIAGNOSIS — B349 Viral infection, unspecified: Secondary | ICD-10-CM | POA: Diagnosis not present

## 2018-05-19 DIAGNOSIS — I951 Orthostatic hypotension: Secondary | ICD-10-CM | POA: Diagnosis not present

## 2018-05-19 DIAGNOSIS — Z7901 Long term (current) use of anticoagulants: Secondary | ICD-10-CM | POA: Diagnosis not present

## 2018-05-19 DIAGNOSIS — R2689 Other abnormalities of gait and mobility: Secondary | ICD-10-CM | POA: Diagnosis not present

## 2018-05-19 DIAGNOSIS — G3184 Mild cognitive impairment, so stated: Secondary | ICD-10-CM | POA: Diagnosis not present

## 2018-05-19 DIAGNOSIS — R531 Weakness: Secondary | ICD-10-CM | POA: Diagnosis not present

## 2018-05-19 DIAGNOSIS — Z95810 Presence of automatic (implantable) cardiac defibrillator: Secondary | ICD-10-CM | POA: Diagnosis not present

## 2018-05-19 DIAGNOSIS — E890 Postprocedural hypothyroidism: Secondary | ICD-10-CM

## 2018-05-19 DIAGNOSIS — M50223 Other cervical disc displacement at C6-C7 level: Secondary | ICD-10-CM | POA: Diagnosis not present

## 2018-05-19 DIAGNOSIS — S32591S Other specified fracture of right pubis, sequela: Secondary | ICD-10-CM | POA: Diagnosis not present

## 2018-05-19 DIAGNOSIS — K5730 Diverticulosis of large intestine without perforation or abscess without bleeding: Secondary | ICD-10-CM | POA: Diagnosis not present

## 2018-05-19 DIAGNOSIS — S32592D Other specified fracture of left pubis, subsequent encounter for fracture with routine healing: Secondary | ICD-10-CM | POA: Diagnosis not present

## 2018-05-19 DIAGNOSIS — S32511A Fracture of superior rim of right pubis, initial encounter for closed fracture: Secondary | ICD-10-CM | POA: Diagnosis not present

## 2018-05-19 DIAGNOSIS — Z79899 Other long term (current) drug therapy: Secondary | ICD-10-CM | POA: Diagnosis not present

## 2018-05-19 DIAGNOSIS — R278 Other lack of coordination: Secondary | ICD-10-CM | POA: Diagnosis not present

## 2018-05-19 LAB — CBC WITH DIFFERENTIAL/PLATELET
Abs Immature Granulocytes: 0.05 10*3/uL (ref 0.00–0.07)
Basophils Absolute: 0 10*3/uL (ref 0.0–0.1)
Basophils Relative: 0 %
Eosinophils Absolute: 0.1 10*3/uL (ref 0.0–0.5)
Eosinophils Relative: 1 %
HCT: 36 % (ref 36.0–46.0)
Hemoglobin: 11.9 g/dL — ABNORMAL LOW (ref 12.0–15.0)
Immature Granulocytes: 1 %
Lymphocytes Relative: 16 %
Lymphs Abs: 1.3 10*3/uL (ref 0.7–4.0)
MCH: 29.8 pg (ref 26.0–34.0)
MCHC: 33.1 g/dL (ref 30.0–36.0)
MCV: 90 fL (ref 80.0–100.0)
Monocytes Absolute: 0.5 10*3/uL (ref 0.1–1.0)
Monocytes Relative: 6 %
NEUTROS PCT: 76 %
Neutro Abs: 5.9 10*3/uL (ref 1.7–7.7)
Platelets: 164 10*3/uL (ref 150–400)
RBC: 4 MIL/uL (ref 3.87–5.11)
RDW: 13.5 % (ref 11.5–15.5)
WBC: 7.7 10*3/uL (ref 4.0–10.5)
nRBC: 0 % (ref 0.0–0.2)

## 2018-05-19 LAB — PROTIME-INR
INR: 1.8 — ABNORMAL HIGH (ref 0.8–1.2)
INR: 1.8 — ABNORMAL HIGH (ref 0.8–1.2)
Prothrombin Time: 20.5 seconds — ABNORMAL HIGH (ref 11.4–15.2)
Prothrombin Time: 20.8 seconds — ABNORMAL HIGH (ref 11.4–15.2)

## 2018-05-19 LAB — BASIC METABOLIC PANEL
Anion gap: 10 (ref 5–15)
Anion gap: 7 (ref 5–15)
BUN: 12 mg/dL (ref 8–23)
BUN: 14 mg/dL (ref 8–23)
CO2: 23 mmol/L (ref 22–32)
CO2: 25 mmol/L (ref 22–32)
Calcium: 8.7 mg/dL — ABNORMAL LOW (ref 8.9–10.3)
Calcium: 8.8 mg/dL — ABNORMAL LOW (ref 8.9–10.3)
Chloride: 100 mmol/L (ref 98–111)
Chloride: 96 mmol/L — ABNORMAL LOW (ref 98–111)
Creatinine, Ser: 0.87 mg/dL (ref 0.44–1.00)
Creatinine, Ser: 0.95 mg/dL (ref 0.44–1.00)
GFR calc Af Amer: >60 mL/min (ref >60)
GFR calc non Af Amer: >60 mL/min (ref >60)
GFR, EST NON AFRICAN AMERICAN: 54 mL/min — AB (ref >60)
Glucose, Bld: 104 mg/dL — ABNORMAL HIGH (ref 70–99)
Glucose, Bld: 128 mg/dL — ABNORMAL HIGH (ref 70–99)
Potassium: 3.4 mmol/L — ABNORMAL LOW (ref 3.5–5.1)
Potassium: 3.4 mmol/L — ABNORMAL LOW (ref 3.5–5.1)
Sodium: 130 mmol/L — ABNORMAL LOW (ref 135–145)
Sodium: 131 mmol/L — ABNORMAL LOW (ref 135–145)

## 2018-05-19 LAB — CBC
HCT: 33.1 % — ABNORMAL LOW (ref 36.0–46.0)
Hemoglobin: 11.3 g/dL — ABNORMAL LOW (ref 12.0–15.0)
MCH: 29.9 pg (ref 26.0–34.0)
MCHC: 34.1 g/dL (ref 30.0–36.0)
MCV: 87.6 fL (ref 80.0–100.0)
Platelets: 164 10*3/uL (ref 150–400)
RBC: 3.78 MIL/uL — ABNORMAL LOW (ref 3.87–5.11)
RDW: 13.4 % (ref 11.5–15.5)
WBC: 10.1 10*3/uL (ref 4.0–10.5)
nRBC: 0 % (ref 0.0–0.2)

## 2018-05-19 MED ORDER — METHOCARBAMOL 500 MG PO TABS
500.0000 mg | ORAL_TABLET | Freq: Three times a day (TID) | ORAL | Status: DC
  Administered 2018-05-19: 500 mg via ORAL
  Filled 2018-05-19: qty 1

## 2018-05-19 MED ORDER — SENNOSIDES-DOCUSATE SODIUM 8.6-50 MG PO TABS: 2.0000 | ORAL_TABLET | Freq: Two times a day (BID) | ORAL | 2 refills | Status: DC

## 2018-05-19 MED ORDER — SENNOSIDES-DOCUSATE SODIUM 8.6-50 MG PO TABS: 1.0000 | ORAL_TABLET | Freq: Every evening | ORAL | Status: DC | PRN

## 2018-05-19 MED ORDER — ACETAMINOPHEN 325 MG PO TABS: 650.0000 mg | ORAL_TABLET | Freq: Four times a day (QID) | ORAL | 2 refills | Status: DC | PRN

## 2018-05-19 MED ORDER — WARFARIN SODIUM 2.5 MG PO TABS
2.5000 mg | ORAL_TABLET | Freq: Once | ORAL | Status: DC
  Filled 2018-05-19: qty 1

## 2018-05-19 MED ORDER — IOHEXOL 300 MG/ML  SOLN
100.0000 mL | Freq: Once | INTRAMUSCULAR | Status: AC | PRN
  Administered 2018-05-19: 100 mL via INTRAVENOUS

## 2018-05-19 MED ORDER — METHOCARBAMOL 500 MG PO TABS: 500.0000 mg | ORAL_TABLET | Freq: Three times a day (TID) | ORAL | 2 refills | Status: DC

## 2018-05-19 MED ORDER — LEVOTHYROXINE SODIUM 75 MCG PO TABS: 75.0000 ug | ORAL_TABLET | Freq: Every day | ORAL | 3 refills | Status: DC

## 2018-05-19 MED ORDER — LEVOTHYROXINE SODIUM 75 MCG PO TABS
75.0000 ug | ORAL_TABLET | Freq: Every day | ORAL | Status: DC
  Administered 2018-05-19: 75 ug via ORAL
  Filled 2018-05-19: qty 1

## 2018-05-19 MED ORDER — ONDANSETRON HCL 4 MG PO TABS: 4.0000 mg | ORAL_TABLET | Freq: Four times a day (QID) | ORAL | Status: DC | PRN

## 2018-05-19 MED ORDER — ACETAMINOPHEN 325 MG PO TABS
650.0000 mg | ORAL_TABLET | Freq: Four times a day (QID) | ORAL | Status: DC | PRN
  Administered 2018-05-19: 650 mg via ORAL
  Filled 2018-05-19: qty 2

## 2018-05-19 MED ORDER — SENNOSIDES-DOCUSATE SODIUM 8.6-50 MG PO TABS
1.0000 | ORAL_TABLET | Freq: Two times a day (BID) | ORAL | Status: DC
  Administered 2018-05-19: 1 via ORAL
  Filled 2018-05-19: qty 1

## 2018-05-19 MED ORDER — POTASSIUM CHLORIDE CRYS ER 20 MEQ PO TBCR
40.0000 meq | EXTENDED_RELEASE_TABLET | Freq: Once | ORAL | Status: AC
  Administered 2018-05-19: 40 meq via ORAL
  Filled 2018-05-19: qty 2

## 2018-05-19 MED ORDER — HYDROCODONE-ACETAMINOPHEN 5-325 MG PO TABS: 1.0000 | ORAL_TABLET | ORAL | 0 refills | Status: DC | PRN

## 2018-05-19 MED ORDER — BISACODYL 5 MG PO TBEC: 5.0000 mg | DELAYED_RELEASE_TABLET | Freq: Every day | ORAL | Status: DC | PRN

## 2018-05-19 MED ORDER — WARFARIN SODIUM 5 MG PO TABS
5.0000 mg | ORAL_TABLET | Freq: Once | ORAL | Status: AC
  Administered 2018-05-19: 5 mg via ORAL
  Filled 2018-05-19: qty 1

## 2018-05-19 MED ORDER — SODIUM CHLORIDE 0.9 % IV SOLN
INTRAVENOUS | Status: AC
  Administered 2018-05-19: 03:00:00 via INTRAVENOUS

## 2018-05-19 MED ORDER — WARFARIN - PHARMACIST DOSING INPATIENT: Freq: Every day | Status: DC

## 2018-05-19 MED ORDER — WARFARIN SODIUM 5 MG PO TABS: ORAL_TABLET | ORAL | 0 refills | Status: DC

## 2018-05-19 MED ORDER — ONDANSETRON HCL 4 MG/2ML IJ SOLN: 4.0000 mg | Freq: Four times a day (QID) | INTRAMUSCULAR | Status: DC | PRN

## 2018-05-19 MED ORDER — HYDROCODONE-ACETAMINOPHEN 5-325 MG PO TABS: 1.0000 | ORAL_TABLET | ORAL | Status: DC | PRN

## 2018-05-19 MED ORDER — ONDANSETRON HCL 4 MG PO TABS: 4.0000 mg | ORAL_TABLET | Freq: Four times a day (QID) | ORAL | 0 refills | Status: DC | PRN

## 2018-05-19 MED ORDER — MORPHINE SULFATE (PF) 4 MG/ML IV SOLN
4.0000 mg | Freq: Once | INTRAVENOUS | Status: AC
  Administered 2018-05-19: 4 mg via INTRAVENOUS
  Filled 2018-05-19: qty 1

## 2018-05-19 MED ORDER — ACETAMINOPHEN 650 MG RE SUPP: 650.0000 mg | Freq: Four times a day (QID) | RECTAL | Status: DC | PRN

## 2018-05-19 MED ORDER — MORPHINE SULFATE (PF) 2 MG/ML IV SOLN: 2.0000 mg | INTRAVENOUS | Status: DC | PRN

## 2018-05-19 MED ORDER — POTASSIUM CHLORIDE CRYS ER 20 MEQ PO TBCR
20.0000 meq | EXTENDED_RELEASE_TABLET | Freq: Once | ORAL | Status: AC
  Administered 2018-05-19: 20 meq via ORAL
  Filled 2018-05-19: qty 1

## 2018-05-19 MED ORDER — MORPHINE SULFATE (PF) 4 MG/ML IV SOLN: 3.0000 mg | INTRAVENOUS | Status: DC | PRN

## 2018-05-19 NOTE — ED Provider Notes (Signed)
[facility] EMERGENCY DEPARTMENT Provider Note   CSN: [id number] Arrival date & time: 05/18/18  2234    History   Chief Complaint Chief Complaint  Patient presents with   Fall    HPI Michele [name] is a 85 y.o. female.     Patient presents to the emergency department with a chief complaint of fall.  She states that she is home this evening, when her shoe stuck to the floor, and caused her to trip.  She complains of right hip pain and also states that she hit her head.  She is anticoagulated on Coumadin.  She complains of mild neck pain, but denies any numbness, weakness, or tingling.  States that her right hip pain is worsened with movement and palpation.  She has been unable to ambulate.  She has not taken anything for her symptoms.  The history is provided by the patient. No language interpreter was used.    Past Medical History:  Diagnosis Date   Atrial fibrillation ([place])    NEG STRESS CARDIOLITE   C. difficile colitis    Cardiomyopathy    RATE RELATED   Cataract    CHF (congestive heart failure) (HCC)    Diverticulitis    PMH of X 2   Diverticulosis    DIVERTICULITIS   Gilbert's syndrome    Hx of colonic polyp 2011   Dr Fuller Plan   Hyperlipidemia    NMR LIOPROFILE LDL 234(3206/2234)HDL 37,TG86   Renal insufficiency 12/09/2015   Thyroid disease    HYPOTHYROIDISM...THYROID NODULES    Patient Active Problem List   Diagnosis Date Noted   Hypokalemia 01/23/2017   Hypophosphatemia 01/23/2017   Syncope 01/22/2017   Aphasia 01/22/2017   Stereotyped movements 01/22/2017   Anticoagulated 06/07/2016   Stroke (cerebrum) ([place]) 05/27/2016   Vertebrobasilar artery syndrome    Complicated migraine    Acute embolic stroke ([place]) 73/71/0626   AKI (acute kidney injury) ([place]) 12/13/2015   Urinary retention 12/13/2015   CKD (chronic kidney disease) stage 3, GFR 30-59 ml/min ([place]) 12/09/2015   Preventative health care 07/13/2015     Grief 07/13/2015   Encounter for therapeutic drug monitoring 04/20/2013   Cardiomyopathy, secondary ([place]) 03/20/2009   CHEST PAIN -.Peri INCISIONAL 07/14/2008   Automatic implantable cardioverter-defibrillator in situ 04/19/2008   FATIGUE 08/31/2007   Personal history of other diseases of digestive system 04/14/2007   Hyperlipidemia 12/22/2006   Anxiety state 11/27/2006   KERATOCONJUNCTIVITIS SICCA 09/08/2006   ATRIAL FIBRILLATION 07/14/2006   Hypothyroidism, postsurgical 07/09/2006    Past Surgical History:  Procedure Laterality Date   ABDOMINAL HYSTERECTOMY  [place]   BIOPSY THYROID     SINGLE [place]   colonoscopy with polypectomy  2011   EP IMPLANTABLE DEVICE N/A 11/21/2015   Procedure: BIV ICD Generator Changeout;  Surgeon: Deboraha Sprang, MD;  Location: [place] [facility];  Service: Cardiovascular;  Laterality: N/A;   ICD....02/2008     LEAD REVISION N/A 08/21/2011   Procedure: LEAD REVISION;  Surgeon: Deboraha Sprang, MD;  Location: [facility] CATH LAB;  Service: Cardiovascular;  Laterality: N/A;   PACEMAKER PLACEMENT     10/2006   ROTATOR CUFF REPAIR     x2   THROIDECTOMY 12/08     benign     OB History   No obstetric history on file.  Home Medications    Prior to Admission medications   Medication Sig Start Date End Date Taking? Authorizing Provider  cholecalciferol (VITAMIN D) 1000 UNITS tablet Take 1,000 Units by mouth daily.   Yes [provider]  levothyroxine (SYNTHROID, LEVOTHROID) 75 MCG tablet Take 1 tablet (75 mcg total) by mouth daily. 05/07/18  Yes Biagio Borg, MD  warfarin (COUMADIN) 5 MG tablet Take 1 tablet daily except 1 1/2 tablets on Tuesdays.  30 day  Pt must have INR checked before further re-fills can be given. Patient taking differently: 5 mg. Take 1 tablet daily 03/30/18  Yes Biagio Borg, MD    Family  History Family History  Problem Relation Age of Onset   Heart attack Mother 77   Breast cancer Sister    Heart attack Brother 79   Lung disease Father        Silicosis   Vasculitis Sister        Giant Cell Arteritis   Ulcerative colitis Son     Social History Social History   Tobacco Use   Smoking status: Never Smoker   Smokeless tobacco: Never Used  Substance Use Topics   Alcohol use: No   Drug use: No     Allergies   Cephalexin; Erythromycin; Terbinafine hcl; Colchicine; Crestor [rosuvastatin calcium]; Rosuvastatin; and Vytorin [ezetimibe-simvastatin]   Review of Systems Review of Systems  All other systems reviewed and are negative.    Physical Exam Updated Vital Signs BP (!) 152/78    Pulse 70    Temp 98.5 F (36.9 C) (Oral)    Resp 20    SpO2 99%   Physical Exam Vitals signs and nursing note reviewed.  Constitutional:      Appearance: She is well-developed.  HENT:     Head: Normocephalic and atraumatic.  Eyes:     Conjunctiva/sclera: Conjunctivae normal.     Pupils: Pupils are equal, round, and reactive to light.  Neck:     Musculoskeletal: Normal range of motion and neck supple.  Cardiovascular:     Rate and Rhythm: Normal rate and regular rhythm.     Heart sounds: No murmur. No friction rub. No gallop.   Pulmonary:     Effort: Pulmonary effort is normal. No respiratory distress.     Breath sounds: Normal breath sounds. No wheezing or rales.  Chest:     Chest wall: No tenderness.  Abdominal:     General: Bowel sounds are normal. There is no distension.     Palpations: Abdomen is soft. There is no mass.     Tenderness: There is no abdominal tenderness. There is no guarding or rebound.  Musculoskeletal:        General: Tenderness present.     Comments: Tenderness to palpation over the right hip, range of motion and strength of right lower extremity limited secondary to pain  Skin:    General: Skin is warm and dry.     Comments: No  visible contusion or hematoma  Neurological:     Mental Status: She is alert and oriented to person, place, and time.  Psychiatric:        Behavior: Behavior normal.        Thought Content: Thought content normal.        Judgment: Judgment normal.      ED Treatments / Results  Labs (all labs ordered are listed, but only abnormal results are displayed) Labs Reviewed  CBC WITH DIFFERENTIAL/PLATELET  BASIC METABOLIC PANEL  PROTIME-INR    EKG None  Radiology Dg Chest 1 View  Result Date: 05/18/2018 CLINICAL DATA:  Fall. EXAM: CHEST  1 VIEW COMPARISON:  01/22/2017 FINDINGS: Left chest wall ICD is identified with leads in the right atrial appendage, coronary sinus and right ventricle. Mild cardiac enlargement. No pleural effusion or edema. No airspace opacities. No acute osseous abnormalities identified. IMPRESSION: 1. No acute findings. Electronically Signed   By: Kerby Moors M.D.   On: 05/18/2018 23:07   Ct Head Wo Contrast  Result Date: 05/18/2018 CLINICAL DATA:  Head trauma. Ataxia. History of Gilbert's syndrome and atrial fibrillation EXAM: CT HEAD WITHOUT CONTRAST CT CERVICAL SPINE WITHOUT CONTRAST TECHNIQUE: Multidetector CT imaging of the head and cervical spine was performed following the standard protocol without intravenous contrast. Multiplanar CT image reconstructions of the cervical spine were also generated. COMPARISON:  02/20/2018 FINDINGS: CT HEAD FINDINGS Brain: No evidence of acute infarction, hemorrhage, hydrocephalus, extra-axial collection or mass lesion/mass effect. There is mild diffuse low-attenuation within the subcortical and periventricular white matter compatible with chronic microvascular disease. Prominence of the ventricles and sulci compatible with brain atrophy. Vascular: No hyperdense vessel or unexpected calcification. Skull: Normal. Negative for fracture or focal lesion. Sinuses/Orbits: No acute finding. Other: None. CT CERVICAL SPINE FINDINGS  Alignment: Normal alignment of the cervical spine. Skull base and vertebrae: The vertebral body heights are well preserved. The facet joints are all well aligned. No fractures Soft tissues and spinal canal: No prevertebral fluid or swelling. No visible canal hematoma. Disc levels: Multi level disc space narrowing and endplate spurring identified. This is most advanced at C5-6. Posterior disc bulge/herniation identified at C6-7. Upper chest: There is a nodule within the posterior right upper lobe measuring 5 mm new from 01/23/2017. Other: None IMPRESSION: 1. No acute intracranial abnormality. 2. Chronic small vessel ischemic change and brain atrophy. 3. No cervical spine fracture or dislocation. 4. Cervical degenerative disc disease 5. 5 mm nodule in the right upper lobe is new. Nonspecific. No follow-up needed if patient is low-risk. Non-contrast chest CT can be considered in 12 months if patient is high-risk. This recommendation follows the consensus statement: Guidelines for Management of Incidental Pulmonary Nodules Detected on CT Images: From the Fleischner Society 2017; Radiology 2017; 284:228-243. Electronically Signed   By: Kerby Moors M.D.   On: 05/18/2018 23:30   Ct Cervical Spine Wo Contrast  Result Date: 05/18/2018 CLINICAL DATA:  Head trauma. Ataxia. History of Gilbert's syndrome and atrial fibrillation EXAM: CT HEAD WITHOUT CONTRAST CT CERVICAL SPINE WITHOUT CONTRAST TECHNIQUE: Multidetector CT imaging of the head and cervical spine was performed following the standard protocol without intravenous contrast. Multiplanar CT image reconstructions of the cervical spine were also generated. COMPARISON:  02/20/2018 FINDINGS: CT HEAD FINDINGS Brain: No evidence of acute infarction, hemorrhage, hydrocephalus, extra-axial collection or mass lesion/mass effect. There is mild diffuse low-attenuation within the subcortical and periventricular white matter compatible with chronic microvascular disease.  Prominence of the ventricles and sulci compatible with brain atrophy. Vascular: No hyperdense vessel or unexpected calcification. Skull: Normal. Negative for fracture or focal lesion. Sinuses/Orbits: No acute finding. Other: None. CT CERVICAL SPINE FINDINGS Alignment: Normal alignment of the cervical spine. Skull base and vertebrae: The vertebral body heights are well preserved. The facet joints are all well aligned. No fractures Soft tissues and spinal canal: No prevertebral fluid or swelling. No visible canal hematoma. Disc levels: Multi level disc space narrowing and endplate spurring identified. This is most advanced at C5-6. Posterior disc bulge/herniation  identified at C6-7. Upper chest: There is a nodule within the posterior right upper lobe measuring 5 mm new from 01/23/2017. Other: None IMPRESSION: 1. No acute intracranial abnormality. 2. Chronic small vessel ischemic change and brain atrophy. 3. No cervical spine fracture or dislocation. 4. Cervical degenerative disc disease 5. 5 mm nodule in the right upper lobe is new. Nonspecific. No follow-up needed if patient is low-risk. Non-contrast chest CT can be considered in 12 months if patient is high-risk. This recommendation follows the consensus statement: Guidelines for Management of Incidental Pulmonary Nodules Detected on CT Images: From the Fleischner Society 2017; Radiology 2017; 284:228-243. Electronically Signed   By: Kerby Moors M.D.   On: 05/18/2018 23:30   Dg Hip Unilat  With Pelvis 2-3 Views Right  Result Date: 05/18/2018 CLINICAL DATA:  85 year old female with fall and right hip pain. EXAM: DG HIP (WITH OR WITHOUT PELVIS) 2-3V RIGHT COMPARISON:  Pelvic radiograph dated 03/02/2018 FINDINGS: Mildly displaced fractures of the right superior and inferior pubic rami. No femoral fractures. The bones are osteopenic. There is no dislocation. The soft tissues are grossly unremarkable. Vascular calcifications noted. IMPRESSION: Mildly displaced  fractures of the right superior and inferior pubic rami. Electronically Signed   By: Anner Crete M.D.   On: 05/18/2018 23:06    Procedures Procedures (including critical care time)  Medications Ordered in ED Medications  fentaNYL (SUBLIMAZE) injection 50 mcg (50 mcg Intravenous Given 05/18/18 2342)     Initial Impression / Assessment and Plan / ED Course  I have reviewed the triage vital signs and the nursing notes.  Pertinent labs & imaging results that were available during my care of the patient were reviewed by me and considered in my medical decision making (see chart for details).        Patient with mechanical fall and right hip injury tonight.  Patient shoe stuck on the ground causing her to trip and fall.  She did not pass out.  CT imaging of her head and neck are negative for acute process.  She did sustain a pelvis fracture.  She has significant pain on palpation.  Patient seen by and discussed with Dr. Wyvonnia Dusky, who recommends CT ab/pel due to anticoagulation to rule out any retroperitoneal bleeding.  CT shows small right obturator hematoma, discussed with radiologist Dr. Jerelyn Scott, who says no evidence of active extrav, though not an angio study.  Patient seen by and discussed with Dr. Wyvonnia Dusky, who recommends admission for PT/OT, pain control and possible placement in rehab center.  Appreciate Dr. Myna Hidalgo, who agrees to observe the patient overnight and see if case management can assist in the morning.    Final Clinical Impressions(s) / ED Diagnoses   Final diagnoses:  Closed bilateral fracture of pubic rami, initial encounter [facility])    ED Discharge Orders    None       Montine Circle, PA-C 05/19/18 0134    Ezequiel Essex, MD 05/19/18 [phone]

## 2018-05-19 NOTE — Discharge Instructions (Signed)
1) you are taking Coumadin for blood thinner so Avoid ibuprofen/Advil/Aleve/Motrin/Goody Powders/Naproxen/BC powders/Meloxicam/Diclofenac/Indomethacin and other Nonsteroidal anti-inflammatory medications as these will make you more likely to bleed and can cause stomach ulcers, can also cause Kidney problems.   2) fall precautions  3) repeat PT/INR every Friday starting 05/21/2018  4)Repeat TSH in 3 to 4 weeks

## 2018-05-19 NOTE — Discharge Summary (Signed)
Michele [name], is a 85 y.o. female  DOB Jun 15, 1929  MRN [id number].  Admission date:  05/18/2018  Admitting Physician  Vianne Bulls, MD  Discharge Date:  05/19/2018   Primary MD  Biagio Borg, MD  Recommendations for primary care physician for things to follow:   1) you are taking Coumadin for blood thinner so Avoid ibuprofen/Advil/Aleve/Motrin/Goody Powders/Naproxen/BC powders/Meloxicam/Diclofenac/Indomethacin and other Nonsteroidal anti-inflammatory medications as these will make you more likely to bleed and can cause stomach ulcers, can also cause Kidney problems.   2) fall precautions  3) repeat PT/INR every Friday starting 05/21/2018  4)Repeat TSH in 3 to 4 weeks    Admission Diagnosis  Closed bilateral fracture of pubic rami, initial encounter ([place]) [S32.591A, S32.592A]  Discharge Diagnosis  Closed bilateral fracture of pubic rami, initial encounter ([place]) [S32.591A, S32.592A]    Principal Problem:   Closed fracture of multiple pubic rami (HCC) Active Problems:   Hypothyroidism, postsurgical   ATRIAL FIBRILLATION   Hypokalemia   Hyponatremia      Past Medical History:  Diagnosis Date   Atrial fibrillation (HCC)    NEG STRESS CARDIOLITE   C. difficile colitis    Cardiomyopathy    RATE RELATED   Cataract    CHF (congestive heart failure) (HCC)    Diverticulitis    PMH of X 2   Diverticulosis    DIVERTICULITIS   Gilbert's syndrome    Hx of colonic polyp 2011   Dr Fuller Plan   Hyperlipidemia    NMR LIOPROFILE LDL 234(3206/2234)HDL 37,TG86   Renal insufficiency 12/09/2015   Thyroid disease    HYPOTHYROIDISM...THYROID NODULES    Past Surgical History:  Procedure Laterality Date   ABDOMINAL HYSTERECTOMY  1973   APPENDECTOMY  1956   BIOPSY THYROID     SINGLE NODULE   BREAST BIOPSY  1997   CARDIAC DEFIBRILLATOR [place]   colonoscopy  with polypectomy  2011   EP IMPLANTABLE DEVICE N/A 11/21/2015   Procedure: BIV ICD Generator Changeout;  Surgeon: Deboraha Sprang, MD;  Location: [facility];  Service: Cardiovascular;  Laterality: N/A;   ICD....02/2008     LEAD REVISION N/A 08/21/2011   Procedure: LEAD REVISION;  Surgeon: Deboraha Sprang, MD;  Location: [facility] C[facility];  Service: Cardiovascular;  Laterality: N/A;   PACEMAKER PLACEMENT     10/2006   ROTATOR CUFF REPAIR     x2   THROIDECTOMY 12/08     benign      HPI  from the history and physical done on the day of admission:    Patient coming from: Home   Chief Complaint: Right hip pain after a fall  HPI: Michele [name] is a 85 y.o. female with medical history significant for Gilbert's syndrome, hyperlipidemia, atrial fibrillation on Coumadin, orthostasis, and tachycardia induced cardiomyopathy status post CRT-D, now presenting to the emergency department with severe right hip pain after a fall.  Patient reports that she had been in her usual state of  health and was having an uneventful day when she tripped and fell in her home, landing on her right side.  She believes that she bumped her head on the ground but did not lose consciousness.  She has a history of orthostasis with a couple episodes of syncope over the years, but reports that this is unchanged and she has managed it well by exercising caution when standing.  She did not have any lightheadedness or syncope today but states that she tripped over something on her floor.  She has had severe right hip pain since the fall, but no significant headache and no change in vision or hearing, or focal numbness or weakness.  She denies any recent fevers, chills, cough, shortness breath, abdominal pain, vomiting, or diarrhea.  ED Course: Upon arrival to the ED, patient is found to be afebrile, saturating well on room air, normal respirations and heart rate, and normal blood pressure.  EKG demonstrates a paced rhythm.   Chest x-ray is negative for acute cardiopulmonary disease.  Radiographs of the hips and pelvis reveal superior and inferior right pubic rami fractures.  Noncontrast head CT is negative for acute intracranial abnormality and C-spine CT is negative for fracture or dislocation.  CT the abdomen and pelvis confirms the mildly displaced fractures of the right superior and inferior pubic rami with a small intramuscular hematoma involving the right obturator inferior muscle, but no other hematoma or fluid collection.  Chemistry panel is notable for sodium 130 and potassium 3.4.  CBC is unremarkable.  INR is 1.8.  Patient was treated with IV fentanyl and IV morphine in the ED, but continues to have severe pain and is unable to bear weight.  She will be observed for ongoing evaluation and management.   Hospital Course:   Assessment/Plan   1. Pubic rami fractures  - Presents with right hip pain after a mechanical fall at home and is found to have right inferior and superior pubic rami fractures with small intramuscular hematoma  - She lives alone and is unable to bear weight in ED despite IV fentanyl and IV morphine  - Physical therapy evaluation appreciated, recommends SNF rehab, for safety and rehab  2. Atrial fibrillation  - In paced-rhythm on admission  - CHADS-VASc is 39 (age x2, gender, HLD)  - Continue warfarin  --INR currently 1.8, continue Coumadin therapy, repeat INR every Friday starting 05/21/2018  3. Hyponatremia  - Serum sodium is 130 in setting of hypovolemia ---patient received IV fluids, sodium trending up currently at 131--- continue IV NS until discharge to SNF rehab  4. Hypokalemia  Potassium was 3.4, replaced already  5. Hypothyroidism -last TSH is > 13, - Continue Synthroid .... Recheck TSH in 3 to 4 weeks  6. Incidental lung nodule  - 5 mm RUL nodule noted incidentally on cervical spine CT  - Per radiology report, "No follow-up needed if patient is low-risk. Non-contrast chest  CT can be considered in 12 months if patient is high-risk. This recommendation follows the consensus statement:  Guidelines for Management of Incidental Pulmonary Nodules Detected on CT Images:  From the Fleischner Society 2017; Radiology 2017; 284:228-243.    Discharge Condition: stable... Discharge to SNF rehab  Follow UP--- PCP post discharge from rehab   Consults obtained - PT eval  Diet and Activity recommendation:  As advised  Discharge Instructions    Discharge Instructions    Call MD for:  difficulty breathing, headache or visual disturbances   Complete by:  As directed  Call MD for:  persistant dizziness or light-headedness   Complete by:  As directed    Call MD for:  persistant nausea and vomiting   Complete by:  As directed    Call MD for:  severe uncontrolled pain   Complete by:  As directed    Call MD for:  temperature >100.4   Complete by:  As directed    Diet - low sodium heart healthy   Complete by:  As directed    Discharge instructions   Complete by:  As directed    1)You are taking Coumadin for blood thinner so Avoid ibuprofen/Advil/Aleve/Motrin/Goody Powders/Naproxen/BC powders/Meloxicam/Diclofenac/Indomethacin and other Nonsteroidal anti-inflammatory medications as these will make you more likely to bleed and can cause stomach ulcers, can also cause Kidney problems.   2)Fall precautions  3)Repeat PT/INR every Friday starting 05/21/2018  4)Repeat TSH in 3 to 4 weeks   Increase activity slowly   Complete by:  As directed    Activity limitations as per physical therapist Fall precautions       Discharge Medications     Allergies as of 05/19/2018      Reactions   Cephalexin Hives   Erythromycin Swelling, Other (See Comments)   REACTION: swollen and sore mouth   Terbinafine Hcl    Rash   Colchicine Other (See Comments)   REACTION: violent diarrhea   Crestor [rosuvastatin Calcium] Other (See Comments)   Arm pain   Rosuvastatin Other (See Comments)     REACTION: upper arm pain bilaterally   Vytorin [ezetimibe-simvastatin] Other (See Comments)   Nightmares with generic      Medication List    TAKE these medications   acetaminophen 325 MG tablet Commonly known as:  TYLENOL Take 2 tablets (650 mg total) by mouth every 6 (six) hours as needed for mild pain, fever or headache (or Fever >/= 101).   cholecalciferol 1000 units tablet Commonly known as:  VITAMIN D Take 1,000 Units by mouth daily.   HYDROcodone-acetaminophen 5-325 MG tablet Commonly known as:  NORCO/VICODIN Take 1 tablet by mouth every 4 (four) hours as needed for moderate pain.   levothyroxine 75 MCG tablet Commonly known as:  SYNTHROID, LEVOTHROID Take 1 tablet (75 mcg total) by mouth daily.   methocarbamol 500 MG tablet Commonly known as:  ROBAXIN Take 1 tablet (500 mg total) by mouth 3 (three) times daily.   ondansetron 4 MG tablet Commonly known as:  ZOFRAN Take 1 tablet (4 mg total) by mouth every 6 (six) hours as needed for nausea.   senna-docusate 8.6-50 MG tablet Commonly known as:  Senokot-S Take 2 tablets by mouth 2 (two) times daily.   warfarin 5 MG tablet Commonly known as:  Coumadin Take as directed. If you are unsure how to take this medication, talk to your nurse or doctor. Original instructions:  Take 1 tablet daily except 1 1/2 tablets on Tuesdays.  30 day  Pt must have INR checked before further re-fills can be given. What changed:    how much to take  additional instructions      Major procedures and Radiology Reports - PLEASE review detailed and final reports for all details, in brief -    Dg Chest 1 View  Result Date: 05/18/2018 CLINICAL DATA:  Fall. EXAM: CHEST  1 VIEW COMPARISON:  01/22/2017 FINDINGS: Left chest wall ICD is identified with leads in the right atrial appendage, coronary sinus and right ventricle. Mild cardiac enlargement. No pleural effusion or edema. No airspace opacities.  No acute osseous abnormalities  identified. IMPRESSION: 1. No acute findings. Electronically Signed   By: Kerby Moors M.D.   On: 05/18/2018 23:07   Ct Head Wo Contrast  Result Date: 05/18/2018 CLINICAL DATA:  Head trauma. Ataxia. History of Gilbert's syndrome and atrial fibrillation EXAM: CT HEAD WITHOUT CONTRAST CT CERVICAL SPINE WITHOUT CONTRAST TECHNIQUE: Multidetector CT imaging of the head and cervical spine was performed following the standard protocol without intravenous contrast. Multiplanar CT image reconstructions of the cervical spine were also generated. COMPARISON:  02/20/2018 FINDINGS: CT HEAD FINDINGS Brain: No evidence of acute infarction, hemorrhage, hydrocephalus, extra-axial collection or mass lesion/mass effect. There is mild diffuse low-attenuation within the subcortical and periventricular white matter compatible with chronic microvascular disease. Prominence of the ventricles and sulci compatible with brain atrophy. Vascular: No hyperdense vessel or unexpected calcification. Skull: Normal. Negative for fracture or focal lesion. Sinuses/Orbits: No acute finding. Other: None. CT CERVICAL SPINE FINDINGS Alignment: Normal alignment of the cervical spine. Skull base and vertebrae: The vertebral body heights are well preserved. The facet joints are all well aligned. No fractures Soft tissues and spinal canal: No prevertebral fluid or swelling. No visible canal hematoma. Disc levels: Multi level disc space narrowing and endplate spurring identified. This is most advanced at C5-6. Posterior disc bulge/herniation identified at C6-7. Upper chest: There is a nodule within the posterior right upper lobe measuring 5 mm new from 01/23/2017. Other: None IMPRESSION: 1. No acute intracranial abnormality. 2. Chronic small vessel ischemic change and brain atrophy. 3. No cervical spine fracture or dislocation. 4. Cervical degenerative disc disease 5. 5 mm nodule in the right upper lobe is new. Nonspecific. No follow-up needed if patient  is low-risk. Non-contrast chest CT can be considered in 12 months if patient is high-risk. This recommendation follows the consensus statement: Guidelines for Management of Incidental Pulmonary Nodules Detected on CT Images: From the Fleischner Society 2017; Radiology 2017; 284:228-243. Electronically Signed   By: Kerby Moors M.D.   On: 05/18/2018 23:30   Ct Cervical Spine Wo Contrast  Result Date: 05/18/2018 CLINICAL DATA:  Head trauma. Ataxia. History of Gilbert's syndrome and atrial fibrillation EXAM: CT HEAD WITHOUT CONTRAST CT CERVICAL SPINE WITHOUT CONTRAST TECHNIQUE: Multidetector CT imaging of the head and cervical spine was performed following the standard protocol without intravenous contrast. Multiplanar CT image reconstructions of the cervical spine were also generated. COMPARISON:  02/20/2018 FINDINGS: CT HEAD FINDINGS Brain: No evidence of acute infarction, hemorrhage, hydrocephalus, extra-axial collection or mass lesion/mass effect. There is mild diffuse low-attenuation within the subcortical and periventricular white matter compatible with chronic microvascular disease. Prominence of the ventricles and sulci compatible with brain atrophy. Vascular: No hyperdense vessel or unexpected calcification. Skull: Normal. Negative for fracture or focal lesion. Sinuses/Orbits: No acute finding. Other: None. CT CERVICAL SPINE FINDINGS Alignment: Normal alignment of the cervical spine. Skull base and vertebrae: The vertebral body heights are well preserved. The facet joints are all well aligned. No fractures Soft tissues and spinal canal: No prevertebral fluid or swelling. No visible canal hematoma. Disc levels: Multi level disc space narrowing and endplate spurring identified. This is most advanced at C5-6. Posterior disc bulge/herniation identified at C6-7. Upper chest: There is a nodule within the posterior right upper lobe measuring 5 mm new from 01/23/2017. Other: None IMPRESSION: 1. No acute  intracranial abnormality. 2. Chronic small vessel ischemic change and brain atrophy. 3. No cervical spine fracture or dislocation. 4. Cervical degenerative disc disease 5. 5 mm nodule in the right  upper lobe is new. Nonspecific. No follow-up needed if patient is low-risk. Non-contrast chest CT can be considered in 12 months if patient is high-risk. This recommendation follows the consensus statement: Guidelines for Management of Incidental Pulmonary Nodules Detected on CT Images: From the Fleischner Society 2017; Radiology 2017; 284:228-243. Electronically Signed   By: Kerby Moors M.D.   On: 05/18/2018 23:30   Ct Abdomen Pelvis W Contrast  Result Date: 05/19/2018 CLINICAL DATA:  85 year old female with fall on right hip and abdominal trauma. EXAM: CT ABDOMEN AND PELVIS WITH CONTRAST TECHNIQUE: Multidetector CT imaging of the abdomen and pelvis was performed using the standard protocol following bolus administration of intravenous contrast. CONTRAST:  126mL OMNIPAQUE IOHEXOL 300 MG/ML  SOLN COMPARISON:  Right hip radiograph dated 05/19/2018 FINDINGS: Lower chest: Bibasilar subpleural linear atelectasis/scarring. The visualized lung bases are otherwise clear. There is mild cardiomegaly. Coronary vascular calcification and cardiac pacemaker wires noted. There is no intra-abdominal free air or free fluid. Hepatobiliary: A 1.5 cm hypodense lesion in the right lobe of the liver appears similar to the study of 2016 most consistent with a cyst. Subcentimeter left hepatic hypodense focus is too small to characterize. There is mild biliary ductal dilatation. Cholecystectomy. Pancreas: Unremarkable. No pancreatic ductal dilatation or surrounding inflammatory changes. Spleen: Normal in size without focal abnormality. Adrenals/Urinary Tract: The adrenal glands are unremarkable. There is no hydronephrosis on either side. There is enhancement and excretion of contrast by both kidneys. The visualized ureters and urinary  bladder appear unremarkable. Stomach/Bowel: There is sigmoid diverticulosis with muscular hypertrophy. No active inflammatory changes. There is no bowel obstruction or active inflammation. There is a small hiatal hernia. Appendectomy. Vascular/Lymphatic: Moderate aortoiliac atherosclerotic disease. No portal venous gas. There is no adenopathy. Reproductive: Hysterectomy. No pelvic mass. Other: None Musculoskeletal: Mildly displaced fractures of the right superior and inferior pubic rami. The bones are osteopenic. No other acute fracture. Mild intramuscular hematoma involving the right obturator internus muscle. No fluid collection or large hematoma. IMPRESSION: 1. Mildly displaced fractures of the right superior and inferior pubic rami with associated small hematoma in the right obturator internus muscle. No other acute/traumatic intra-abdominal or pelvic pathology identified. 2. Sigmoid diverticulosis. Electronically Signed   By: Anner Crete M.D.   On: 05/19/2018 01:05   Dg Hip Unilat  With Pelvis 2-3 Views Right  Result Date: 05/18/2018 CLINICAL DATA:  85 year old female with fall and right hip pain. EXAM: DG HIP (WITH OR WITHOUT PELVIS) 2-3V RIGHT COMPARISON:  Pelvic radiograph dated 03/02/2018 FINDINGS: Mildly displaced fractures of the right superior and inferior pubic rami. No femoral fractures. The bones are osteopenic. There is no dislocation. The soft tissues are grossly unremarkable. Vascular calcifications noted. IMPRESSION: Mildly displaced fractures of the right superior and inferior pubic rami. Electronically Signed   By: Anner Crete M.D.   On: 05/18/2018 23:06   Dg Femur Min 2 Views Right  Result Date: 05/19/2018 CLINICAL DATA:  Fall. EXAM: RIGHT FEMUR 2 VIEWS COMPARISON:  03/02/2018 FINDINGS: Acute comminuted superior pubic rami fracture identified. A second fracture is noted involving the inferior pubic rami. The right hip appears located. No fracture identified. IMPRESSION: 1.  Acute right superior and inferior pubic rami fractures. Electronically Signed   By: Kerby Moors M.D.   On: 05/19/2018 00:36   Micro Results   Today   Subjective    Javae Braaten today has complaints of pelvic pubic area discomfort with positional change and weightbearing.... Significant challenges with weightbearing.... Very high fall risk  Patient has been seen and examined prior to discharge   Objective   Blood pressure 112/66, pulse 70, temperature (!) 97.3 F (36.3 C), temperature source Oral, resp. rate 16, height 5\' 3"  (1.6 m), weight 49.5 kg, SpO2 98 %.   Intake/Output Summary (Last 24 hours) at 05/19/2018 1026 Last data filed at 05/19/2018 0900 Gross per 24 hour  Intake 277.58 ml  Output 800 ml  Net -522.42 ml    Exam Gen:- Awake Alert, no acute distress  HEENT:- [place].AT, No sclera icterus Neck-Supple Neck,No JVD,.  Lungs-  CTAB , good air movement bilaterally  CV- S1, S2 normal, regular Abd-  +ve B.Sounds, Abd Soft, No tenderness,  Extremity/Skin:- No  edema,   good pulses Psych-affect is appropriate, oriented x3 Neuro-no new focal deficits, no tremors  MSK- Pelvic pubic area discomfort with positional change and weightbearing.... Significant challenges with weightbearing.... Very high fall risk   Data Review   CBC w Diff:  Lab Results  Component Value Date   WBC 10.1 05/19/2018   HGB 11.3 (L) 05/19/2018   HCT 33.1 (L) 05/19/2018   PLT 164 05/19/2018   LYMPHOPCT 16 05/18/2018   MONOPCT 6 05/18/2018   EOSPCT 1 05/18/2018   BASOPCT 0 05/18/2018    CMP:  Lab Results  Component Value Date   NA 131 (L) 05/19/2018   NA 137 03/17/2017   K 3.4 (L) 05/19/2018   CL 96 (L) 05/19/2018   CO2 25 05/19/2018   BUN 12 05/19/2018   BUN 22 03/17/2017   CREATININE 0.87 05/19/2018   CREATININE 1.19 (H) 11/14/2015   PROT 7.6 03/02/2018   ALBUMIN 4.1 03/02/2018   BILITOT 3.8 (H) 03/02/2018   ALKPHOS 78 03/02/2018   AST 28 03/02/2018   ALT 17 03/02/2018    Total Discharge time is about 33 minutes  Roxan Hockey M.D on 05/19/2018 at 10:26 AM  Go to [email or web address] -  for contact info  [facility] - Office  [phone]

## 2018-05-19 NOTE — ED Notes (Signed)
Admitting provider at bedside.

## 2018-05-19 NOTE — ED Notes (Signed)
Attempted report 

## 2018-05-19 NOTE — Progress Notes (Signed)
ANTICOAGULATION CONSULT NOTE - Initial Consult  Pharmacy Consult for Coumadin Indication: atrial fibrillation  Allergies  Allergen Reactions  . Cephalexin Hives  . Erythromycin Swelling and Other (See Comments)    REACTION: swollen and sore mouth  . Terbinafine Hcl     Rash   . Colchicine Other (See Comments)    REACTION: violent diarrhea  . Crestor [Rosuvastatin Calcium] Other (See Comments)    Arm pain  . Rosuvastatin Other (See Comments)    REACTION: upper arm pain bilaterally  . Vytorin [Ezetimibe-Simvastatin] Other (See Comments)    Nightmares with generic    Patient Measurements: Height: 5\' 3"  (160 cm) Weight: 109 lb 2 oz (49.5 kg) IBW/kg (Calculated) : 52.4   Vital Signs: Temp: 97.3 F (36.3 C) (04/01 0456) Temp Source: Oral (04/01 0456) BP: 112/66 (04/01 0456) Pulse Rate: 70 (04/01 0456)  Labs: Recent Labs    05/18/18 2343 05/18/18 2355 05/19/18 0403  HGB 11.9*  --  11.3*  HCT 36.0  --  33.1*  PLT 164  --  164  LABPROT  --  20.8* 20.5*  INR  --  1.8* 1.8*  CREATININE 0.95  --  0.87    Estimated Creatinine Clearance: 36.3 mL/min (by C-G formula based on SCr of 0.87 mg/dL).  Assessment: CC/HPI:  Admitted with fall/pelvic fracture  PMH: HLD afib on warfarin  Anticoag: warfarin pta for afib Admit INR 1.8  PTA dose 2.5 mg MF 5 mg AOD - per anticoag visit 3/20  Renal: SCr 0.87  Heme/Onc: H&H 11.3/33.1, Plt 164  Goal of Therapy:  INR 2-3 Monitor platelets by anticoagulation protocol: Yes   Plan: Warfarin 2.5 mg x 1 (was given his dose for 3/31 @ 0200) Daily INR  Levester Fresh, PharmD, BCPS, BCCCP Clinical Pharmacist [phone]  Please check AMION for all [place] numbers  05/19/2018 10:01 AM

## 2018-05-19 NOTE — Plan of Care (Signed)
  Problem: Education: Goal: Knowledge of General Education information will improve Description Including pain rating scale, medication(s)/side effects and non-pharmacologic comfort measures 05/19/2018 1210 by Levie Heritage, RN Outcome: Progressing 05/19/2018 1204 by Levie Heritage, RN Outcome: Progressing   Problem: Health Behavior/Discharge Planning: Goal: Ability to manage health-related needs will improve 05/19/2018 1210 by Levie Heritage, RN Outcome: Progressing 05/19/2018 1204 by Levie Heritage, RN Outcome: Progressing   Problem: Clinical Measurements: Goal: Ability to maintain clinical measurements within normal limits will improve 05/19/2018 1210 by Levie Heritage, RN Outcome: Progressing 05/19/2018 1204 by Levie Heritage, RN Outcome: Progressing Goal: Will remain free from infection 05/19/2018 1210 by Levie Heritage, RN Outcome: Progressing 05/19/2018 1204 by Levie Heritage, RN Outcome: Progressing Goal: Diagnostic test results will improve 05/19/2018 1210 by Levie Heritage, RN Outcome: Progressing 05/19/2018 1204 by Levie Heritage, RN Outcome: Progressing Goal: Respiratory complications will improve 05/19/2018 1210 by Levie Heritage, RN Outcome: Progressing 05/19/2018 1204 by Levie Heritage, RN Outcome: Progressing Goal: Cardiovascular complication will be avoided 05/19/2018 1210 by Levie Heritage, RN Outcome: Progressing 05/19/2018 1204 by Levie Heritage, RN Outcome: Progressing   Problem: Activity: Goal: Risk for activity intolerance will decrease 05/19/2018 1210 by Levie Heritage, RN Outcome: Progressing 05/19/2018 1204 by Levie Heritage, RN Outcome: Progressing   Problem: Nutrition: Goal: Adequate nutrition will be maintained 05/19/2018 1210 by Levie Heritage, RN Outcome: Progressing 05/19/2018 1204 by Levie Heritage, RN Outcome: Progressing   Problem: Coping: Goal: Level of anxiety will decrease 05/19/2018 1210 by Levie Heritage, RN Outcome: Progressing 05/19/2018 1204 by Levie Heritage,  RN Outcome: Progressing   Problem: Elimination: Goal: Will not experience complications related to bowel motility 05/19/2018 1210 by Levie Heritage, RN Outcome: Progressing 05/19/2018 1204 by Levie Heritage, RN Outcome: Progressing Goal: Will not experience complications related to urinary retention 05/19/2018 1210 by Levie Heritage, RN Outcome: Progressing 05/19/2018 1204 by Levie Heritage, RN Outcome: Progressing   Problem: Pain Managment: Goal: General experience of comfort will improve 05/19/2018 1210 by Levie Heritage, RN Outcome: Progressing 05/19/2018 1204 by Levie Heritage, RN Outcome: Progressing   Problem: Safety: Goal: Ability to remain free from injury will improve 05/19/2018 1210 by Levie Heritage, RN Outcome: Progressing 05/19/2018 1204 by Levie Heritage, RN Outcome: Progressing   Problem: Skin Integrity: Goal: Risk for impaired skin integrity will decrease 05/19/2018 1210 by Levie Heritage, RN Outcome: Progressing 05/19/2018 1204 by Levie Heritage, RN Outcome: Progressing

## 2018-05-19 NOTE — NC FL2 (Signed)
[place] LEVEL OF CARE SCREENING TOOL     IDENTIFICATION  Patient Name: Michele [name] Birthdate: October 10, 1929 Sex: female Admission Date (Current Location): 05/18/2018  [facility] and [place] Number:  Herbalist and Address:  The [place], [place] [address], [place], [place] [address]      Provider Number: [id number]  Attending Physician Name and Address:  Roxan Hockey, MD  Relative Name and Phone Number:  [name] (daughter) [phone]    Current Level of Care: Hospital Recommended Level of Care: [place] Prior Approval Number:    Date Approved/Denied: 01/23/17 PASRR Number: [id number]  Discharge Plan: SNF    Current Diagnoses: Patient Active Problem List   Diagnosis Date Noted  . Closed fracture of multiple pubic rami ([place]) 05/19/2018  . Hyponatremia 05/19/2018  . Hypokalemia 01/23/2017  . Hypophosphatemia 01/23/2017  . Syncope 01/22/2017  . Aphasia 01/22/2017  . Stereotyped movements 01/22/2017  . Anticoagulated 06/07/2016  . Stroke (cerebrum) ([place]) 05/27/2016  . Vertebrobasilar artery syndrome   . Complicated migraine   . Acute embolic stroke ([place]) 36/01/2448  . AKI (acute kidney injury) ([place]) 12/13/2015  . Urinary retention 12/13/2015  . CKD (chronic kidney disease) stage 3, GFR 30-59 ml/min (HCC) 12/09/2015  . Preventative health care 07/13/2015  . Grief 07/13/2015  . Encounter for therapeutic drug monitoring 04/20/2013  . Cardiomyopathy, secondary ([place]) 03/20/2009  . CHEST PAIN -.Peri INCISIONAL 07/14/2008  . Automatic implantable cardioverter-defibrillator in situ 04/19/2008  . FATIGUE 08/31/2007  . Personal history of other diseases of digestive system 04/14/2007  . Hyperlipidemia 12/22/2006  . Anxiety state 11/27/2006  . KERATOCONJUNCTIVITIS SICCA 09/08/2006  . ATRIAL FIBRILLATION 07/14/2006  . Hypothyroidism, postsurgical 07/09/2006    Orientation RESPIRATION BLADDER Height & Weight      Self, Time, Situation, Place  Normal Incontinent, External catheter Weight: 109 lb 2 oz (49.5 kg) Height:  5\' 3"  (160 cm)  BEHAVIORAL SYMPTOMS/MOOD NEUROLOGICAL BOWEL NUTRITION STATUS      Continent Diet(see discharge summary)  AMBULATORY STATUS COMMUNICATION OF NEEDS Skin   Extensive Assist Verbally Normal                       Personal Care Assistance Level of Assistance  Bathing, Feeding, Dressing, Total care Bathing Assistance: Limited assistance Feeding assistance: Independent Dressing Assistance: Limited assistance Total Care Assistance: Maximum assistance   Functional Limitations Info  Sight, Hearing, Speech Sight Info: Adequate Hearing Info: Adequate Speech Info: Adequate    SPECIAL CARE FACTORS FREQUENCY  PT (By licensed PT), OT (By licensed OT)     PT Frequency: min 5x weekly OT Frequency: min 5x weekly            Contractures Contractures Info: Not present    Additional Factors Info  Code Status, Allergies Code Status Info: Full Allergies Info: Cephalexin, Erythromycin, Terbinafine Hcl, Colchicine, Crestor (rosuvastatin Calcium), Rosuvastatin, Vytorin (ezetimibesimvastatin)           Current Medications (05/19/2018):  This is the current hospital active medication list Current Facility-Administered Medications  Medication Dose Route Frequency Provider Last Rate Last Dose  . 0.9 %  sodium chloride infusion   Intravenous Continuous Opyd, Ilene Qua, MD 75 mL/hr at 05/19/18 0241    . acetaminophen (TYLENOL) tablet 650 mg  650 mg Oral Q6H PRN Opyd, Ilene Qua, MD   650 mg at 05/19/18 0242   Or  . acetaminophen (TYLENOL) suppository 650 mg  650 mg Rectal Q6H PRN Opyd,  Ilene Qua, MD      . bisacodyl (DULCOLAX) EC tablet 5 mg  5 mg Oral Daily PRN Opyd, Ilene Qua, MD      . HYDROcodone-acetaminophen (NORCO/VICODIN) 5-325 MG per tablet 1 tablet  1 tablet Oral Q4H PRN Opyd, Ilene Qua, MD      . levothyroxine (SYNTHROID, LEVOTHROID) tablet 75 mcg  75 mcg  Oral Daily Opyd, Ilene Qua, MD   75 mcg at 05/19/18 0601  . methocarbamol (ROBAXIN) tablet 500 mg  500 mg Oral TID Roxan Hockey, MD   500 mg at 05/19/18 0905  . morphine 2 MG/ML injection 2 mg  2 mg Intravenous Q4H PRN Emokpae, Courage, MD      . ondansetron (ZOFRAN) tablet 4 mg  4 mg Oral Q6H PRN Opyd, Ilene Qua, MD       Or  . ondansetron (ZOFRAN) injection 4 mg  4 mg Intravenous Q6H PRN Opyd, Ilene Qua, MD      . potassium chloride SA (K-DUR,KLOR-CON) CR tablet 40 mEq  40 mEq Oral Once Emokpae, Courage, MD      . senna-docusate (Senokot-S) tablet 1 tablet  1 tablet Oral BID Roxan Hockey, MD   1 tablet at 05/19/18 0905  . warfarin (COUMADIN) tablet 2.5 mg  2.5 mg Oral ONCE-1800 Wynell Balloon, RPH      . Warfarin - Pharmacist Dosing Inpatient   Does not apply q1800 Opyd, Ilene Qua, MD         Discharge Medications: Please see discharge summary for a list of discharge medications.  Relevant Imaging Results:  Relevant Lab Results:   Additional Information SSN: [id number]  Alberteen Sam, LCSW

## 2018-05-19 NOTE — ED Notes (Signed)
ED TO INPATIENT HANDOFF REPORT  ED Nurse Name and Phone #: Suezanne Jacquet [place] Name/Age/Gender Michele [name] 85 y.o. female Room/Bed: 017C/017C  Code Status   Code Status: Prior  Home/SNF/Other Home Patient oriented to: self, place, time and situation Is this baseline? Yes   Triage Complete: Triage complete  Chief Complaint Fall on thinners, hit head, hip pain  Triage Note BIB [facility] from home with c/o of fall on her right hip. Per EMS deformity noted. Pt states she did hit her head but denies LOC. Pt is taking coumadin. Pt refused pain medication PTA.    Allergies Allergies  Allergen Reactions  . Cephalexin Hives  . Erythromycin Swelling and Other (See Comments)    REACTION: swollen and sore mouth  . Terbinafine Hcl     Rash   . Colchicine Other (See Comments)    REACTION: violent diarrhea  . Crestor [Rosuvastatin Calcium] Other (See Comments)    Arm pain  . Rosuvastatin Other (See Comments)    REACTION: upper arm pain bilaterally  . Vytorin [Ezetimibe-Simvastatin] Other (See Comments)    Nightmares with generic    Level of Care/Admitting Diagnosis ED Disposition    None      B Medical/Surgery History Past Medical History:  Diagnosis Date  . Atrial fibrillation ([place])    NEG STRESS CARDIOLITE  . C. difficile colitis   . Cardiomyopathy    RATE RELATED  . Cataract   . CHF (congestive heart failure) ([place])   . Diverticulitis    PMH of X 2  . Diverticulosis    DIVERTICULITIS  . Gilbert's syndrome   . Hx of colonic polyp 2011   Dr Fuller Plan  . Hyperlipidemia    NMR LIOPROFILE LDL 234(3206/2234)HDL 37,TG86  . Renal insufficiency 12/09/2015  . Thyroid disease    HYPOTHYROIDISM...THYROID NODULES   Past Surgical History:  Procedure Laterality Date  . ABDOMINAL HYSTERECTOMY  1973  . APPENDECTOMY  1956  . BIOPSY THYROID     SINGLE NODULE  . BREAST BIOPSY  1997  . CARDIAC DEFIBRILLATOR PLACEMENT    . CHOLECYSTECTOMY  1972  . colonoscopy with  polypectomy  2011  . EP IMPLANTABLE DEVICE N/A 11/21/2015   Procedure: BIV ICD Generator Changeout;  Surgeon: Deboraha Sprang, MD;  Location: [facility] LAB;  Service: Cardiovascular;  Laterality: N/A;  . ICD....02/2008    . LEAD REVISION N/A 08/21/2011   Procedure: LEAD REVISION;  Surgeon: Deboraha Sprang, MD;  Location: [facility] CATH LAB;  Service: Cardiovascular;  Laterality: N/A;  . PACEMAKER PLACEMENT     10/2006  . ROTATOR CUFF REPAIR     x2  . THROIDECTOMY 12/08     benign     A IV Location/Drains/Wounds Patient Lines/Drains/Airways Status   Active Line/Drains/Airways    Name:   Placement date:   Placement time:   Site:   Days:   Peripheral IV 05/18/18 Right;Upper Forearm   05/18/18    2343    Forearm   1          Intake/Output Last 24 hours No intake or output data in the 24 hours ending 05/19/18 0117  Labs/Imaging Results for orders placed or performed during the hospital encounter of 05/18/18 (from the past 48 hour(s))  CBC with Differential/Platelet     Status: Abnormal   Collection Time: 05/18/18 11:43 PM  Result Value Ref Range   WBC 7.7 4.0 - 10.5 K/uL   RBC 4.00 3.87 - 5.11 MIL/uL  Hemoglobin 11.9 (L) 12.0 - 15.0 g/dL   HCT 36.0 36.0 - 46.0 %   MCV 90.0 80.0 - 100.0 fL   MCH 29.8 26.0 - 34.0 pg   MCHC 33.1 30.0 - 36.0 g/dL   RDW 13.5 11.5 - 15.5 %   Platelets 164 150 - 400 K/uL   nRBC 0.0 0.0 - 0.2 %   Neutrophils Relative % 76 %   Neutro Abs 5.9 1.7 - 7.7 K/uL   Lymphocytes Relative 16 %   Lymphs Abs 1.3 0.7 - 4.0 K/uL   Monocytes Relative 6 %   Monocytes Absolute 0.5 0.1 - 1.0 K/uL   Eosinophils Relative 1 %   Eosinophils Absolute 0.1 0.0 - 0.5 K/uL   Basophils Relative 0 %   Basophils Absolute 0.0 0.0 - 0.1 K/uL   Immature Granulocytes 1 %   Abs Immature Granulocytes 0.05 0.00 - 0.07 K/uL    Comment: Performed at [facility] [address], [place], [place] [address]  Basic metabolic panel     Status: Abnormal   Collection Time: 05/18/18  11:43 PM  Result Value Ref Range   Sodium 130 (L) 135 - 145 mmol/L   Potassium 3.4 (L) 3.5 - 5.1 mmol/L   Chloride 100 98 - 111 mmol/L   CO2 23 22 - 32 mmol/L   Glucose, Bld 104 (H) 70 - 99 mg/dL   BUN 14 8 - 23 mg/dL   Creatinine, Ser 0.95 0.44 - 1.00 mg/dL   Calcium 8.7 (L) 8.9 - 10.3 mg/dL   GFR calc non Af Amer 54 (L) >60 mL/min   GFR calc Af Amer >60 >60 mL/min   Anion gap 7 5 - 15    Comment: Performed at [facility] [address], [place], [place] [address]  Protime-INR     Status: Abnormal   Collection Time: 05/18/18 11:55 PM  Result Value Ref Range   Prothrombin Time 20.8 (H) 11.4 - 15.2 seconds   INR 1.8 (H) 0.8 - 1.2    Comment: (NOTE) INR goal varies based on device and disease states. Performed at [facility], [place] [address], [place], [place] [address]    Dg Chest 1 View  Result Date: 05/18/2018 CLINICAL DATA:  Fall. EXAM: CHEST  1 VIEW COMPARISON:  01/22/2017 FINDINGS: Left chest wall ICD is identified with leads in the right atrial appendage, coronary sinus and right ventricle. Mild cardiac enlargement. No pleural effusion or edema. No airspace opacities. No acute osseous abnormalities identified. IMPRESSION: 1. No acute findings. Electronically Signed   By: Kerby Moors M.D.   On: 05/18/2018 23:07   Ct Head Wo Contrast  Result Date: 05/18/2018 CLINICAL DATA:  Head trauma. Ataxia. History of Gilbert's syndrome and atrial fibrillation EXAM: CT HEAD WITHOUT CONTRAST CT CERVICAL SPINE WITHOUT CONTRAST TECHNIQUE: Multidetector CT imaging of the head and cervical spine was performed following the standard protocol without intravenous contrast. Multiplanar CT image reconstructions of the cervical spine were also generated. COMPARISON:  02/20/2018 FINDINGS: CT HEAD FINDINGS Brain: No evidence of acute infarction, hemorrhage, hydrocephalus, extra-axial collection or mass lesion/mass effect. There is mild diffuse low-attenuation within the subcortical and  periventricular white matter compatible with chronic microvascular disease. Prominence of the ventricles and sulci compatible with brain atrophy. Vascular: No hyperdense vessel or unexpected calcification. Skull: Normal. Negative for fracture or focal lesion. Sinuses/Orbits: No acute finding. Other: None. CT CERVICAL SPINE FINDINGS Alignment: Normal alignment of the cervical spine. Skull base and vertebrae:  The vertebral body heights are well preserved. The facet joints are all well aligned. No fractures Soft tissues and spinal canal: No prevertebral fluid or swelling. No visible canal hematoma. Disc levels: Multi level disc space narrowing and endplate spurring identified. This is most advanced at C5-6. Posterior disc bulge/herniation identified at C6-7. Upper chest: There is a nodule within the posterior right upper lobe measuring 5 mm new from 01/23/2017. Other: None IMPRESSION: 1. No acute intracranial abnormality. 2. Chronic small vessel ischemic change and brain atrophy. 3. No cervical spine fracture or dislocation. 4. Cervical degenerative disc disease 5. 5 mm nodule in the right upper lobe is new. Nonspecific. No follow-up needed if patient is low-risk. Non-contrast chest CT can be considered in 12 months if patient is high-risk. This recommendation follows the consensus statement: Guidelines for Management of Incidental Pulmonary Nodules Detected on CT Images: From the Fleischner Society 2017; Radiology 2017; 284:228-243. Electronically Signed   By: Kerby Moors M.D.   On: 05/18/2018 23:30   Ct Cervical Spine Wo Contrast  Result Date: 05/18/2018 CLINICAL DATA:  Head trauma. Ataxia. History of Gilbert's syndrome and atrial fibrillation EXAM: CT HEAD WITHOUT CONTRAST CT CERVICAL SPINE WITHOUT CONTRAST TECHNIQUE: Multidetector CT imaging of the head and cervical spine was performed following the standard protocol without intravenous contrast. Multiplanar CT image reconstructions of the cervical spine  were also generated. COMPARISON:  02/20/2018 FINDINGS: CT HEAD FINDINGS Brain: No evidence of acute infarction, hemorrhage, hydrocephalus, extra-axial collection or mass lesion/mass effect. There is mild diffuse low-attenuation within the subcortical and periventricular white matter compatible with chronic microvascular disease. Prominence of the ventricles and sulci compatible with brain atrophy. Vascular: No hyperdense vessel or unexpected calcification. Skull: Normal. Negative for fracture or focal lesion. Sinuses/Orbits: No acute finding. Other: None. CT CERVICAL SPINE FINDINGS Alignment: Normal alignment of the cervical spine. Skull base and vertebrae: The vertebral body heights are well preserved. The facet joints are all well aligned. No fractures Soft tissues and spinal canal: No prevertebral fluid or swelling. No visible canal hematoma. Disc levels: Multi level disc space narrowing and endplate spurring identified. This is most advanced at C5-6. Posterior disc bulge/herniation identified at C6-7. Upper chest: There is a nodule within the posterior right upper lobe measuring 5 mm new from 01/23/2017. Other: None IMPRESSION: 1. No acute intracranial abnormality. 2. Chronic small vessel ischemic change and brain atrophy. 3. No cervical spine fracture or dislocation. 4. Cervical degenerative disc disease 5. 5 mm nodule in the right upper lobe is new. Nonspecific. No follow-up needed if patient is low-risk. Non-contrast chest CT can be considered in 12 months if patient is high-risk. This recommendation follows the consensus statement: Guidelines for Management of Incidental Pulmonary Nodules Detected on CT Images: From the Fleischner Society 2017; Radiology 2017; 284:228-243. Electronically Signed   By: Kerby Moors M.D.   On: 05/18/2018 23:30   Ct Abdomen Pelvis W Contrast  Result Date: 05/19/2018 CLINICAL DATA:  85 year old female with fall on right hip and abdominal trauma. EXAM: CT ABDOMEN AND PELVIS  WITH CONTRAST TECHNIQUE: Multidetector CT imaging of the abdomen and pelvis was performed using the standard protocol following bolus administration of intravenous contrast. CONTRAST:  13mL OMNIPAQUE IOHEXOL 300 MG/ML  SOLN COMPARISON:  Right hip radiograph dated 05/19/2018 FINDINGS: Lower chest: Bibasilar subpleural linear atelectasis/scarring. The visualized lung bases are otherwise clear. There is mild cardiomegaly. Coronary vascular calcification and cardiac pacemaker wires noted. There is no intra-abdominal free air or free fluid. Hepatobiliary: A 1.5 cm hypodense  lesion in the right lobe of the liver appears similar to the study of 2016 most consistent with a cyst. Subcentimeter left hepatic hypodense focus is too small to characterize. There is mild biliary ductal dilatation. Cholecystectomy. Pancreas: Unremarkable. No pancreatic ductal dilatation or surrounding inflammatory changes. Spleen: Normal in size without focal abnormality. Adrenals/Urinary Tract: The adrenal glands are unremarkable. There is no hydronephrosis on either side. There is enhancement and excretion of contrast by both kidneys. The visualized ureters and urinary bladder appear unremarkable. Stomach/Bowel: There is sigmoid diverticulosis with muscular hypertrophy. No active inflammatory changes. There is no bowel obstruction or active inflammation. There is a small hiatal hernia. Appendectomy. Vascular/Lymphatic: Moderate aortoiliac atherosclerotic disease. No portal venous gas. There is no adenopathy. Reproductive: Hysterectomy. No pelvic mass. Other: None Musculoskeletal: Mildly displaced fractures of the right superior and inferior pubic rami. The bones are osteopenic. No other acute fracture. Mild intramuscular hematoma involving the right obturator internus muscle. No fluid collection or large hematoma. IMPRESSION: 1. Mildly displaced fractures of the right superior and inferior pubic rami with associated small hematoma in the right  obturator internus muscle. No other acute/traumatic intra-abdominal or pelvic pathology identified. 2. Sigmoid diverticulosis. Electronically Signed   By: Anner Crete M.D.   On: 05/19/2018 01:05   Dg Hip Unilat  With Pelvis 2-3 Views Right  Result Date: 05/18/2018 CLINICAL DATA:  85 year old female with fall and right hip pain. EXAM: DG HIP (WITH OR WITHOUT PELVIS) 2-3V RIGHT COMPARISON:  Pelvic radiograph dated 03/02/2018 FINDINGS: Mildly displaced fractures of the right superior and inferior pubic rami. No femoral fractures. The bones are osteopenic. There is no dislocation. The soft tissues are grossly unremarkable. Vascular calcifications noted. IMPRESSION: Mildly displaced fractures of the right superior and inferior pubic rami. Electronically Signed   By: Anner Crete M.D.   On: 05/18/2018 23:06   Dg Femur Min 2 Views Right  Result Date: 05/19/2018 CLINICAL DATA:  Fall. EXAM: RIGHT FEMUR 2 VIEWS COMPARISON:  03/02/2018 FINDINGS: Acute comminuted superior pubic rami fracture identified. A second fracture is noted involving the inferior pubic rami. The right hip appears located. No fracture identified. IMPRESSION: 1. Acute right superior and inferior pubic rami fractures. Electronically Signed   By: Kerby Moors M.D.   On: 05/19/2018 00:36    Pending Labs Unresulted Labs (From admission, onward)   None      Vitals/Pain Today's Vitals   05/18/18 2345 05/19/18 0000 05/19/18 0032 05/19/18 0036  BP: 138/68 (!) 152/78 (!) 141/58   Pulse: 74 70 71   Resp: (!) 21 20 13    Temp:      TempSrc:      SpO2: 100% 99% 99%   PainSc:    4     Isolation Precautions No active isolations  Medications Medications  fentaNYL (SUBLIMAZE) injection 50 mcg (50 mcg Intravenous Given 05/18/18 2342)  iohexol (OMNIPAQUE) 300 MG/ML solution 100 mL (100 mLs Intravenous Contrast Given 05/19/18 0041)    Mobility non-ambulatory High fall risk   Focused Assessments Ortho   R Recommendations:  See Admitting Provider Note  Report given to:   Additional Notes:  Pt lives at home and does not have family that lives near by.

## 2018-05-19 NOTE — TOC Transition Note (Signed)
Transition of Care [facility]) - CM/SW Discharge Note   Patient Details  Name: Michele [name] MRN: [id number] Date of Birth: [birth year]  [facility] CM/SW Contact:  Alberteen Sam, LCSW Phone Number: 05/19/2018, 12:13 PM   Clinical Narrative:     Patient will DC to: [facility]  Anticipated DC date: 05/19/2018 Family notified: no permission given to contact family per patient Transport by: Corey Harold  Per MD patient ready for DC to [facility] . RN, patient, patient's family, and facility notified of DC. Discharge Summary sent to facility. RN given number for report [phone] Room 102P. DC packet on chart. Ambulance transport requested for patient.  CSW signing off.  [place], [place]   Final next level of care: Skilled Nursing Facility Barriers to Discharge: No Barriers Identified   Patient Goals and CMS Choice Patient states their goals for this hospitalization and ongoing recovery are:: to go to rehab then get back at her home CMS Medicare.gov Compare Post Acute Care list provided to:: Patient Choice offered to / list presented to : Patient  Discharge Placement   Existing PASRR number confirmed : 05/19/18          Patient [facility] bed at: [facility] Patient to be transferred to facility by: Mel[place] Name of family member notified: patient has not given CSW permission to contact    Discharge Plan and Services   Discharge Planning Services: NA Post Acute Care Choice: [place]          DME Arranged: N/A DME Agency: NA HH Arranged: NA [place] Agency: NA   Social Determinants of Health (SDOH) Interventions     Readmission Risk Interventions No flowsheet data found.

## 2018-05-19 NOTE — Progress Notes (Signed)
PIV removed. Report called and given to [facility]. Spoke with daughter as well with updates. No further questions.

## 2018-05-19 NOTE — Evaluation (Signed)
Physical Therapy Evaluation Patient Details Name: Michele [name] MRN: [id number] DOB: [birth year] Today's Date: 05/19/2018   History of Present Illness  Michele [name] is a 85 y.o. female with medical history significant for Gilbert's syndrome, hyperlipidemia, atrial fibrillation on Coumadin, orthostasis, and tachycardia induced cardiomyopathy status post CRT-D, now presenting to the emergency department with severe right hip pain after a fall.  Patient reports that she had been in her usual state of health and was having an uneventful day when she tripped and fell in her home, landing on her right side.    Clinical Impression  Pt admitted with above diagnosis. Pt currently with functional limitations due to the deficits listed below (see PT Problem List). PTA, pt independent living at home alone with stairs, no AD, reports 1 prior fall. Today, requiring mod-max A for mobility due to pain. unsuccessful in standing EOB with premature sit due to pain.  Pt will benefit from skilled PT to increase their independence and safety with mobility to allow discharge to the venue listed below.       Follow Up Recommendations SNF    Equipment Recommendations  (TBD next venue)    Recommendations for Other Services       Precautions / Restrictions Precautions Precautions: Fall Restrictions Weight Bearing Restrictions: No      Mobility  Bed Mobility Overal bed mobility: Needs Assistance Bed Mobility: Supine to Sit;Sit to Supine     Supine to sit: Max assist Sit to supine: Max assist   General bed mobility comments: Max A due to pain  Transfers Overall transfer level: Needs assistance Equipment used: Rolling walker (2 wheeled) Transfers: Sit to/from Stand Sit to Stand: Mod assist         General transfer comment: Mod A to stand hand held assist. unable to bear weight at this time and falls back onto bed.  Ambulation/Gait                Stairs            Wheelchair  Mobility    Modified Rankin (Stroke Patients Only)       Balance Overall balance assessment: Needs assistance   Sitting balance-Leahy Scale: Poor(due to pain)       Standing balance-Leahy Scale: Zero(cannot bear weight at this time)                               Pertinent Vitals/Pain Pain Assessment: No/denies pain    Home Living Family/patient expects to be discharged to:: Skilled nursing facility Living Arrangements: Alone                    Prior Function Level of Independence: Independent         Comments: patient lives alone, drives, I with mobility. has stairs at her home.      Hand Dominance        Extremity/Trunk Assessment   Upper Extremity Assessment Upper Extremity Assessment: Overall WFL for tasks assessed    Lower Extremity Assessment Lower Extremity Assessment: Generalized weakness(limited assesment due to pain)    Cervical / Trunk Assessment Cervical / Trunk Assessment: Normal  Communication   Communication: No difficulties  Cognition Arousal/Alertness: Awake/alert Behavior During Therapy: WFL for tasks assessed/performed  General Comments      Exercises     Assessment/Plan    PT Assessment Patient needs continued PT services  PT Problem List Decreased strength       PT Treatment Interventions DME instruction;Gait training;Stair training;Therapeutic activities;Functional mobility training;Therapeutic exercise;Balance training;Neuromuscular re-education;Cognitive remediation;Patient/family education    PT Goals (Current goals can be found in the Care Plan section)  Acute Rehab PT Goals Patient Stated Goal: walk again  PT Goal Formulation: With patient Potential to Achieve Goals: Good    Frequency Min 3X/week   Barriers to discharge Inaccessible home environment;Decreased caregiver support      Co-evaluation               AM-PAC PT "6  Clicks" Mobility  Outcome Measure Help needed turning from your back to your side while in a flat bed without using bedrails?: A Lot Help needed moving from lying on your back to sitting on the side of a flat bed without using bedrails?: A Lot Help needed moving to and from a bed to a chair (including a wheelchair)?: Total Help needed standing up from a chair using your arms (e.g., wheelchair or bedside chair)?: Total Help needed to walk in hospital room?: Total Help needed climbing 3-5 steps with a railing? : Total 6 Click Score: 8    End of Session Equipment Utilized During Treatment: Gait belt Activity Tolerance: Patient limited by pain Patient left: in bed;with bed alarm set;with call bell/phone within reach Nurse Communication: Mobility status PT Visit Diagnosis: Unsteadiness on feet (R26.81)    Time: 4503-8882 PT Time Calculation (min) (ACUTE ONLY): 16 min   Charges:   PT Evaluation $PT Eval Moderate Complexity: 1 Mod          Reinaldo Berber, PT, DPT Acute Rehabilitation Services Pager: [phone] Office: [phone]    Reinaldo Berber 05/19/2018, 10:14 AM

## 2018-05-19 NOTE — ED Notes (Signed)
Patient transported to X-ray 

## 2018-05-19 NOTE — Progress Notes (Signed)
ANTICOAGULATION CONSULT NOTE - Initial Consult  Pharmacy Consult for Coumadin Indication: atrial fibrillation  Allergies  Allergen Reactions  . Cephalexin Hives  . Erythromycin Swelling and Other (See Comments)    REACTION: swollen and sore mouth  . Terbinafine Hcl     Rash   . Colchicine Other (See Comments)    REACTION: violent diarrhea  . Crestor [Rosuvastatin Calcium] Other (See Comments)    Arm pain  . Rosuvastatin Other (See Comments)    REACTION: upper arm pain bilaterally  . Vytorin [Ezetimibe-Simvastatin] Other (See Comments)    Nightmares with generic    Patient Measurements:     Vital Signs: Temp: 98.5 F (36.9 C) (03/31 2218) Temp Source: Oral (03/31 2218) BP: 141/58 (04/01 0032) Pulse Rate: 71 (04/01 0032)  Labs: Recent Labs    05/18/18 2343 05/18/18 2355  HGB 11.9*  --   HCT 36.0  --   PLT 164  --   LABPROT  --  20.8*  INR  --  1.8*  CREATININE 0.95  --     CrCl cannot be calculated (Unknown ideal weight.).   Medical History: Past Medical History:  Diagnosis Date  . Atrial fibrillation ([place])    NEG STRESS CARDIOLITE  . C. difficile colitis   . Cardiomyopathy    RATE RELATED  . Cataract   . CHF (congestive heart failure) ([place])   . Diverticulitis    PMH of X 2  . Diverticulosis    DIVERTICULITIS  . Gilbert's syndrome   . Hx of colonic polyp 2011   Dr Fuller Plan  . Hyperlipidemia    NMR LIOPROFILE LDL 234(3206/2234)HDL 37,TG86  . Renal insufficiency 12/09/2015  . Thyroid disease    HYPOTHYROIDISM...THYROID NODULES    Medications:  No current facility-administered medications on file prior to encounter.    Current Outpatient Medications on File Prior to Encounter  Medication Sig Dispense Refill  . cholecalciferol (VITAMIN D) 1000 UNITS tablet Take 1,000 Units by mouth daily.    Marland Kitchen levothyroxine (SYNTHROID, LEVOTHROID) 75 MCG tablet Take 1 tablet (75 mcg total) by mouth daily. 90 tablet 3  . warfarin (COUMADIN) 5 MG tablet Take 1  tablet daily except 1 1/2 tablets on Tuesdays.  30 day  Pt must have INR checked before further re-fills can be given. (Patient taking differently: 5 mg. Take 1 tablet daily) 35 tablet 0     Assessment: 85 y.o. female admitted with pelvic fracture, h/o Afib to continue Coumadin   Goal of Therapy:  INR 2-3 Monitor platelets by anticoagulation protocol: Yes   Plan: Coumadin 5 mg tonight Daily INR  Reilynn Lauro, Bronson Curb 05/19/2018,1:45 AM

## 2018-05-19 NOTE — H&P (Addendum)
History and Physical    IVALENE [name] TOI:[id number] DOB: [birth year] DOA: 05/18/2018  PCP: Biagio Borg, MD   Patient coming from: Home   Chief Complaint: Right hip pain after a fall  HPI: Michele [name] is a 85 y.o. female with medical history significant for Gilbert's syndrome, hyperlipidemia, atrial fibrillation on Coumadin, orthostasis, and tachycardia induced cardiomyopathy status post CRT-D, now presenting to the emergency department with severe right hip pain after a fall.  Patient reports that she had been in her usual state of health and was having an uneventful day when she tripped and fell in her home, landing on her right side.  She believes that she bumped her head on the ground but did not lose consciousness.  She has a history of orthostasis with a couple episodes of syncope over the years, but reports that this is unchanged and she has managed it well by exercising caution when standing.  She did not have any lightheadedness or syncope today but states that she tripped over something on her floor.  She has had severe right hip pain since the fall, but no significant headache and no change in vision or hearing, or focal numbness or weakness.  She denies any recent fevers, chills, cough, shortness breath, abdominal pain, vomiting, or diarrhea.  ED Course: Upon arrival to the ED, patient is found to be afebrile, saturating well on room air, normal respirations and heart rate, and normal blood pressure.  EKG demonstrates a paced rhythm.  Chest x-ray is negative for acute cardiopulmonary disease.  Radiographs of the hips and pelvis reveal superior and inferior right pubic rami fractures.  Noncontrast head CT is negative for acute intracranial abnormality and C-spine CT is negative for fracture or dislocation.  CT the abdomen and pelvis confirms the mildly displaced fractures of the right superior and inferior pubic rami with a small intramuscular hematoma involving the right obturator  inferior muscle, but no other hematoma or fluid collection.  Chemistry panel is notable for sodium 130 and potassium 3.4.  CBC is unremarkable.  INR is 1.8.  Patient was treated with IV fentanyl and IV morphine in the ED, but continues to have severe pain and is unable to bear weight.  She will be observed for ongoing evaluation and management.  Review of Systems:  All other systems reviewed and apart from HPI, are negative.  Past Medical History:  Diagnosis Date   Atrial fibrillation ([place])    NEG STRESS CARDIOLITE   C. difficile colitis    Cardiomyopathy    RATE RELATED   Cataract    CHF (congestive heart failure) (HCC)    Diverticulitis    PMH of X 2   Diverticulosis    DIVERTICULITIS   Gilbert's syndrome    Hx of colonic polyp 2011   Dr Fuller Plan   Hyperlipidemia    NMR LIOPROFILE LDL 234(3206/2234)HDL 37,TG86   Renal insufficiency 12/09/2015   Thyroid disease    HYPOTHYROIDISM...THYROID NODULES    Past Surgical History:  Procedure Laterality Date   ABDOMINAL HYSTERECTOMY  1973   APPENDECTOMY  1956   BIOPSY THYROID     SINGLE NODULE   BREAST BIOPSY  1997   CARDIAC DEFIBRILLATOR [place]   colonoscopy with polypectomy  2011   EP IMPLANTABLE DEVICE N/A 11/21/2015   Procedure: BIV ICD Generator Changeout;  Surgeon: Deboraha Sprang, MD;  Location: [facility];  Service: Cardiovascular;  Laterality: N/A;   ICD....02/2008  LEAD REVISION N/A 08/21/2011   Procedure: LEAD REVISION;  Surgeon: Deboraha Sprang, MD;  Location: [facility] CATH LAB;  Service: Cardiovascular;  Laterality: N/A;   PACEMAKER PLACEMENT     10/2006   ROTATOR CUFF REPAIR     x2   THROIDECTOMY 12/08     benign     reports that she has never smoked. She has never used smokeless tobacco. She reports that she does not drink alcohol or use drugs.  Allergies  Allergen Reactions   Cephalexin Hives   Erythromycin Swelling and Other (See Comments)     REACTION: swollen and sore mouth   Terbinafine Hcl     Rash    Colchicine Other (See Comments)    REACTION: violent diarrhea   Crestor [Rosuvastatin Calcium] Other (See Comments)    Arm pain   Rosuvastatin Other (See Comments)    REACTION: upper arm pain bilaterally   Vytorin [Ezetimibe-Simvastatin] Other (See Comments)    Nightmares with generic    Family History  Problem Relation Age of Onset   Heart attack Mother 49   Breast cancer Sister    Heart attack Brother 60   Lung disease Father        Silicosis   Vasculitis Sister        Giant Cell Arteritis   Ulcerative colitis Son      Prior to Admission medications   Medication Sig Start Date End Date Taking? Authorizing Provider  cholecalciferol (VITAMIN D) 1000 UNITS tablet Take 1,000 Units by mouth daily.   Yes [provider]  levothyroxine (SYNTHROID, LEVOTHROID) 75 MCG tablet Take 1 tablet (75 mcg total) by mouth daily. 05/07/18  Yes Biagio Borg, MD  warfarin (COUMADIN) 5 MG tablet Take 1 tablet daily except 1 1/2 tablets on Tuesdays.  30 day  Pt must have INR checked before further re-fills can be given. Patient taking differently: 5 mg. Take 1 tablet daily 03/30/18  Yes Biagio Borg, MD    Physical Exam: Vitals:   05/18/18 2218 05/18/18 2345 05/19/18 0000 05/19/18 0032  BP: (!) 151/95 138/68 (!) 152/78 (!) 141/58  Pulse: 79 74 70 71  Resp: 18 (!) 21 20 13   Temp: 98.5 F (36.9 C)     TempSrc: Oral     SpO2: 98% 100% 99% 99%    Constitutional: NAD, appears uncomfortable  Eyes: PERTLA, lids and conjunctivae normal ENMT: Mucous membranes are moist. Posterior pharynx clear of any exudate or lesions.   Neck: normal, supple, no masses, no thyromegaly Respiratory: clear to auscultation bilaterally, no wheezing, no crackles. Normal respiratory effort.   Cardiovascular: S1 & S2 heard, regular rate and rhythm. No extremity edema.  Abdomen: No distension, no tenderness, soft. Bowel sounds active.    Musculoskeletal: no clubbing / cyanosis. No joint deformity upper and lower extremities.   Skin: no significant rashes, lesions, ulcers. Warm, dry, well-perfused. Neurologic: CN 2-12 grossly intact. Sensation intact. Strength 5/5 in all 4 limbs.  Psychiatric: Alert and oriented to person, place, and situation. Calm, cooperative.     Labs on Admission: I have personally reviewed following labs and imaging studies  CBC: Recent Labs  Lab 05/18/18 2343  WBC 7.7  NEUTROABS 5.9  HGB 11.9*  HCT 36.0  MCV 90.0  PLT 759   Basic Metabolic Panel: Recent Labs  Lab 05/18/18 2343  NA 130*  K 3.4*  CL 100  CO2 23  GLUCOSE 104*  BUN 14  CREATININE 0.95  CALCIUM 8.7*  GFR: CrCl cannot be calculated (Unknown ideal weight.). Liver Function Tests: No results for input(s): AST, ALT, ALKPHOS, BILITOT, PROT, ALBUMIN in the last 168 hours. No results for input(s): LIPASE, AMYLASE in the last 168 hours. No results for input(s): AMMONIA in the last 168 hours. Coagulation Profile: Recent Labs  Lab 05/18/18 2355  INR 1.8*   Cardiac Enzymes: No results for input(s): CKTOTAL, CKMB, CKMBINDEX, TROPONINI in the last 168 hours. BNP (last 3 results) No results for input(s): PROBNP in the last 8760 hours. HbA1C: No results for input(s): HGBA1C in the last 72 hours. CBG: No results for input(s): GLUCAP in the last 168 hours. Lipid Profile: No results for input(s): CHOL, HDL, LDLCALC, TRIG, CHOLHDL, LDLDIRECT in the last 72 hours. Thyroid Function Tests: No results for input(s): TSH, T4TOTAL, FREET4, T3FREE, THYROIDAB in the last 72 hours. Anemia Panel: No results for input(s): VITAMINB12, FOLATE, FERRITIN, TIBC, IRON, RETICCTPCT in the last 72 hours. Urine analysis:    Component Value Date/Time   COLORURINE YELLOW 01/22/2017 2141   APPEARANCEUR CLEAR 01/22/2017 2141   LABSPEC 1.009 01/22/2017 2141   PHURINE 7.0 01/22/2017 2141   GLUCOSEU NEGATIVE 01/22/2017 2141   GLUCOSEU NEGATIVE  09/01/2016 1524   HGBUR NEGATIVE 01/22/2017 2141   BILIRUBINUR NEGATIVE 01/22/2017 2141   BILIRUBINUR negative 09/16/2013 1358   KETONESUR 5 (A) 01/22/2017 2141   PROTEINUR NEGATIVE 01/22/2017 2141   UROBILINOGEN 0.2 09/01/2016 1524   NITRITE NEGATIVE 01/22/2017 2141   LEUKOCYTESUR NEGATIVE 01/22/2017 2141   Sepsis Labs: @LABRCNTIP (procalcitonin:4,lacticidven:4) )No results found for this or any previous visit (from the past 240 hour(s)).   Radiological Exams on Admission: Dg Chest 1 View  Result Date: 05/18/2018 CLINICAL DATA:  Fall. EXAM: CHEST  1 VIEW COMPARISON:  01/22/2017 FINDINGS: Left chest wall ICD is identified with leads in the right atrial appendage, coronary sinus and right ventricle. Mild cardiac enlargement. No pleural effusion or edema. No airspace opacities. No acute osseous abnormalities identified. IMPRESSION: 1. No acute findings. Electronically Signed   By: Kerby Moors M.D.   On: 05/18/2018 23:07   Ct Head Wo Contrast  Result Date: 05/18/2018 CLINICAL DATA:  Head trauma. Ataxia. History of Gilbert's syndrome and atrial fibrillation EXAM: CT HEAD WITHOUT CONTRAST CT CERVICAL SPINE WITHOUT CONTRAST TECHNIQUE: Multidetector CT imaging of the head and cervical spine was performed following the standard protocol without intravenous contrast. Multiplanar CT image reconstructions of the cervical spine were also generated. COMPARISON:  02/20/2018 FINDINGS: CT HEAD FINDINGS Brain: No evidence of acute infarction, hemorrhage, hydrocephalus, extra-axial collection or mass lesion/mass effect. There is mild diffuse low-attenuation within the subcortical and periventricular white matter compatible with chronic microvascular disease. Prominence of the ventricles and sulci compatible with brain atrophy. Vascular: No hyperdense vessel or unexpected calcification. Skull: Normal. Negative for fracture or focal lesion. Sinuses/Orbits: No acute finding. Other: None. CT CERVICAL SPINE FINDINGS  Alignment: Normal alignment of the cervical spine. Skull base and vertebrae: The vertebral body heights are well preserved. The facet joints are all well aligned. No fractures Soft tissues and spinal canal: No prevertebral fluid or swelling. No visible canal hematoma. Disc levels: Multi level disc space narrowing and endplate spurring identified. This is most advanced at C5-6. Posterior disc bulge/herniation identified at C6-7. Upper chest: There is a nodule within the posterior right upper lobe measuring 5 mm new from 01/23/2017. Other: None IMPRESSION: 1. No acute intracranial abnormality. 2. Chronic small vessel ischemic change and brain atrophy. 3. No cervical spine fracture or dislocation. 4. Cervical  degenerative disc disease 5. 5 mm nodule in the right upper lobe is new. Nonspecific. No follow-up needed if patient is low-risk. Non-contrast chest CT can be considered in 12 months if patient is high-risk. This recommendation follows the consensus statement: Guidelines for Management of Incidental Pulmonary Nodules Detected on CT Images: From the Fleischner Society 2017; Radiology 2017; 284:228-243. Electronically Signed   By: Kerby Moors M.D.   On: 05/18/2018 23:30   Ct Cervical Spine Wo Contrast  Result Date: 05/18/2018 CLINICAL DATA:  Head trauma. Ataxia. History of Gilbert's syndrome and atrial fibrillation EXAM: CT HEAD WITHOUT CONTRAST CT CERVICAL SPINE WITHOUT CONTRAST TECHNIQUE: Multidetector CT imaging of the head and cervical spine was performed following the standard protocol without intravenous contrast. Multiplanar CT image reconstructions of the cervical spine were also generated. COMPARISON:  02/20/2018 FINDINGS: CT HEAD FINDINGS Brain: No evidence of acute infarction, hemorrhage, hydrocephalus, extra-axial collection or mass lesion/mass effect. There is mild diffuse low-attenuation within the subcortical and periventricular white matter compatible with chronic microvascular disease.  Prominence of the ventricles and sulci compatible with brain atrophy. Vascular: No hyperdense vessel or unexpected calcification. Skull: Normal. Negative for fracture or focal lesion. Sinuses/Orbits: No acute finding. Other: None. CT CERVICAL SPINE FINDINGS Alignment: Normal alignment of the cervical spine. Skull base and vertebrae: The vertebral body heights are well preserved. The facet joints are all well aligned. No fractures Soft tissues and spinal canal: No prevertebral fluid or swelling. No visible canal hematoma. Disc levels: Multi level disc space narrowing and endplate spurring identified. This is most advanced at C5-6. Posterior disc bulge/herniation identified at C6-7. Upper chest: There is a nodule within the posterior right upper lobe measuring 5 mm new from 01/23/2017. Other: None IMPRESSION: 1. No acute intracranial abnormality. 2. Chronic small vessel ischemic change and brain atrophy. 3. No cervical spine fracture or dislocation. 4. Cervical degenerative disc disease 5. 5 mm nodule in the right upper lobe is new. Nonspecific. No follow-up needed if patient is low-risk. Non-contrast chest CT can be considered in 12 months if patient is high-risk. This recommendation follows the consensus statement: Guidelines for Management of Incidental Pulmonary Nodules Detected on CT Images: From the Fleischner Society 2017; Radiology 2017; 284:228-243. Electronically Signed   By: Kerby Moors M.D.   On: 05/18/2018 23:30   Ct Abdomen Pelvis W Contrast  Result Date: 05/19/2018 CLINICAL DATA:  85 year old female with fall on right hip and abdominal trauma. EXAM: CT ABDOMEN AND PELVIS WITH CONTRAST TECHNIQUE: Multidetector CT imaging of the abdomen and pelvis was performed using the standard protocol following bolus administration of intravenous contrast. CONTRAST:  161mL OMNIPAQUE IOHEXOL 300 MG/ML  SOLN COMPARISON:  Right hip radiograph dated 05/19/2018 FINDINGS: Lower chest: Bibasilar subpleural linear  atelectasis/scarring. The visualized lung bases are otherwise clear. There is mild cardiomegaly. Coronary vascular calcification and cardiac pacemaker wires noted. There is no intra-abdominal free air or free fluid. Hepatobiliary: A 1.5 cm hypodense lesion in the right lobe of the liver appears similar to the study of 2016 most consistent with a cyst. Subcentimeter left hepatic hypodense focus is too small to characterize. There is mild biliary ductal dilatation. Cholecystectomy. Pancreas: Unremarkable. No pancreatic ductal dilatation or surrounding inflammatory changes. Spleen: Normal in size without focal abnormality. Adrenals/Urinary Tract: The adrenal glands are unremarkable. There is no hydronephrosis on either side. There is enhancement and excretion of contrast by both kidneys. The visualized ureters and urinary bladder appear unremarkable. Stomach/Bowel: There is sigmoid diverticulosis with muscular hypertrophy. No active inflammatory  changes. There is no bowel obstruction or active inflammation. There is a small hiatal hernia. Appendectomy. Vascular/Lymphatic: Moderate aortoiliac atherosclerotic disease. No portal venous gas. There is no adenopathy. Reproductive: Hysterectomy. No pelvic mass. Other: None Musculoskeletal: Mildly displaced fractures of the right superior and inferior pubic rami. The bones are osteopenic. No other acute fracture. Mild intramuscular hematoma involving the right obturator internus muscle. No fluid collection or large hematoma. IMPRESSION: 1. Mildly displaced fractures of the right superior and inferior pubic rami with associated small hematoma in the right obturator internus muscle. No other acute/traumatic intra-abdominal or pelvic pathology identified. 2. Sigmoid diverticulosis. Electronically Signed   By: Anner Crete M.D.   On: 05/19/2018 01:05   Dg Hip Unilat  With Pelvis 2-3 Views Right  Result Date: 05/18/2018 CLINICAL DATA:  85 year old female with fall and right  hip pain. EXAM: DG HIP (WITH OR WITHOUT PELVIS) 2-3V RIGHT COMPARISON:  Pelvic radiograph dated 03/02/2018 FINDINGS: Mildly displaced fractures of the right superior and inferior pubic rami. No femoral fractures. The bones are osteopenic. There is no dislocation. The soft tissues are grossly unremarkable. Vascular calcifications noted. IMPRESSION: Mildly displaced fractures of the right superior and inferior pubic rami. Electronically Signed   By: Anner Crete M.D.   On: 05/18/2018 23:06   Dg Femur Min 2 Views Right  Result Date: 05/19/2018 CLINICAL DATA:  Fall. EXAM: RIGHT FEMUR 2 VIEWS COMPARISON:  03/02/2018 FINDINGS: Acute comminuted superior pubic rami fracture identified. A second fracture is noted involving the inferior pubic rami. The right hip appears located. No fracture identified. IMPRESSION: 1. Acute right superior and inferior pubic rami fractures. Electronically Signed   By: Kerby Moors M.D.   On: 05/19/2018 00:36    EKG: Independently reviewed. Ventricular-paced rhythm.   Assessment/Plan   1. Pubic rami fractures  - Presents with right hip pain after a mechanical fall at home and is found to have right inferior and superior pubic rami fractures with small intramuscular hematoma  - She lives alone and is unable to bear weight in ED despite IV fentanyl and IV morphine  - Continue pain-control, consult with PT for eval and treatment    2. Atrial fibrillation  - In paced-rhythm on admission  - CHADS-VASc is 70 (age x2, gender, HLD)  - Continue warfarin    3. Hyponatremia  - Serum sodium is 130 in setting of hypovolemia  - Gentle IVF hydration overnight and repeat chem panel in am   4. Hypokalemia  - Serum potassium slightly low in ED and replaced with 20 mEq oral potassium    5. Hypothyroidism  - Continue Synthroid   6. Incidental lung nodule  - 5 mm RUL nodule noted incidentally on cervical spine CT  - Per radiology report, "No follow-up needed if patient is  low-risk. Non-contrast chest CT can be considered in 12 months if patient is high-risk. This recommendation follows the consensus statement:  Guidelines for Management of Incidental Pulmonary Nodules Detected on CT Images:  From the Fleischner Society 2017; Radiology 2017; 284:228-243.     DVT prophylaxis: Coumadin  Code Status: Full  Family Communication: Discussed with patient  Consults called: None  Admission status: Observation    Vianne Bulls, MD [facility] Pager [phone]  If 7PM-7AM, please contact night-coverage [email or web address] Password [email or web address]  05/19/2018, 1:38 AM

## 2018-05-19 NOTE — Plan of Care (Signed)

## 2018-05-19 NOTE — TOC Initial Note (Signed)
Transition of Care [facility]) - Initial/Assessment Note    Patient Details  Name: Michele [name] MRN: [id number] Date of Birth: September 12, 1929  Transition of Care [facility] CM/SW Contact:    Alberteen Sam, LCSW Phone Number: 05/19/2018, 11:07 AM  Clinical Narrative:                  CSW consulted with patient regarding [place]. Patient expressed understanding of PT recommendation of short term phsycial therapy rehab. Patient reports she has been to a facility in the past but could not remember the name, reports it is off [address]. CSW educated patient on facilities in [place] including [facility] off [address], patient agrees for referral to be sent. Patient reports not need to contact any family at this time. CSW will sent referral to [facility].   Expected Discharge Plan: Skilled Nursing Facility Barriers to Discharge: No Barriers Identified   Patient Goals and CMS Choice Patient states their goals for this hospitalization and ongoing recovery are:: to go to rehab then home CMS Medicare.gov Compare Post Acute Care list provided to:: Patient Choice offered to / list presented to : Patient  Expected Discharge Plan and Services Expected Discharge Plan: [place]   Discharge Planning Services: NA Post Acute Care Choice: [place] Living arrangements for the past 2 months: Single Family Home Expected Discharge Date: 05/19/18               DME Arranged: N/A DME Agency: NA HH Arranged: NA [place] Agency: NA  Prior Living Arrangements/Services Living arrangements for the past 2 months: Single Family Home Lives with:: Self Patient language and need for interpreter reviewed:: Yes Do you feel safe going back to the place where you live?: No   patient lives home alone and cannot walk right now  Need for Family Participation in Patient Care: No (Comment) Care giver support system in place?: Yes (comment)   Criminal Activity/Legal Involvement  Pertinent to Current Situation/Hospitalization: No - Comment as needed  Activities of Daily Living Home Assistive Devices/Equipment: Walker (specify type) ADL Screening (condition at time of admission) Patient's cognitive ability adequate to safely complete daily activities?: Yes Is the patient deaf or have difficulty hearing?: No Does the patient have difficulty seeing, even when wearing glasses/contacts?: No Does the patient have difficulty concentrating, remembering, or making decisions?: No Patient able to express need for assistance with ADLs?: Yes Does the patient have difficulty dressing or bathing?: No Independently performs ADLs?: Yes (appropriate for developmental age) Does the patient have difficulty walking or climbing stairs?: Yes Weakness of Legs: Both Weakness of Arms/Hands: None  Permission Sought/Granted Permission sought to share information with : Case Manager, Investment banker, corporate granted to share info w AGENCY: SNF        Emotional Assessment Appearance:: Appears stated age Attitude/Demeanor/Rapport: Gracious Affect (typically observed): Accepting Orientation: : Oriented to Self, Oriented to  Time, Oriented to Place, Oriented to Situation Alcohol / Substance Use: Not Applicable Psych Involvement: No (comment)  Admission diagnosis:  Closed bilateral fracture of pubic rami, initial encounter ([place]) [S32.591A, S32.592A] Patient Active Problem List   Diagnosis Date Noted  . Closed fracture of multiple pubic rami ([place]) 05/19/2018  . Hyponatremia 05/19/2018  . Hypokalemia 01/23/2017  . Hypophosphatemia 01/23/2017  . Syncope 01/22/2017  . Aphasia 01/22/2017  . Stereotyped movements 01/22/2017  . Anticoagulated 06/07/2016  . Stroke (cerebrum) ([place]) 05/27/2016  . Vertebrobasilar artery syndrome   . Complicated  migraine   . Acute embolic stroke ([place]) 40/37/5436  . AKI (acute kidney injury) ([place]) 12/13/2015  . Urinary retention  12/13/2015  . CKD (chronic kidney disease) stage 3, GFR 30-59 ml/min (HCC) 12/09/2015  . Preventative health care 07/13/2015  . Grief 07/13/2015  . Encounter for therapeutic drug monitoring 04/20/2013  . Cardiomyopathy, secondary ([place]) 03/20/2009  . CHEST PAIN -.Peri INCISIONAL 07/14/2008  . Automatic implantable cardioverter-defibrillator in situ 04/19/2008  . FATIGUE 08/31/2007  . Personal history of other diseases of digestive system 04/14/2007  . Hyperlipidemia 12/22/2006  . Anxiety state 11/27/2006  . KERATOCONJUNCTIVITIS SICCA 09/08/2006  . ATRIAL FIBRILLATION 07/14/2006  . Hypothyroidism, postsurgical 07/09/2006   PCP:  Biagio Borg, MD Pharmacy:   [facility] [phone] [place], [place] - [address] [place] [address] Phone: [phone] Fax: [phone]  [place], [place] [facility] [address] [place] [address] [place] [address] Phone: [phone] Fax: [phone]     Social Determinants of Health (SDOH) Interventions    Readmission Risk Interventions No flowsheet data found.

## 2018-05-20 ENCOUNTER — Telehealth: Payer: Self-pay | Admitting: *Deleted

## 2018-05-20 DIAGNOSIS — I951 Orthostatic hypotension: Secondary | ICD-10-CM | POA: Diagnosis not present

## 2018-05-20 DIAGNOSIS — R911 Solitary pulmonary nodule: Secondary | ICD-10-CM | POA: Diagnosis not present

## 2018-05-20 DIAGNOSIS — R008 Other abnormalities of heart beat: Secondary | ICD-10-CM | POA: Diagnosis not present

## 2018-05-20 DIAGNOSIS — I480 Paroxysmal atrial fibrillation: Secondary | ICD-10-CM | POA: Diagnosis not present

## 2018-05-20 DIAGNOSIS — R2689 Other abnormalities of gait and mobility: Secondary | ICD-10-CM | POA: Diagnosis not present

## 2018-05-20 NOTE — Telephone Encounter (Signed)
Pt was on TCM report admitted 05/18/18 after fall dx w/Closed bilateral fracture of pubic rami with small intramuscular hematoma. Physical therapy evaluation and recommends SNF rehab, for safety. Pt D/C 05/19/18 to SNF will need to see PCP after release from SNF.Marland KitchenJohny Chess

## 2018-05-21 ENCOUNTER — Ambulatory Visit: Payer: Medicare Other

## 2018-06-02 ENCOUNTER — Other Ambulatory Visit: Payer: Self-pay | Admitting: Internal Medicine

## 2018-06-04 DIAGNOSIS — S32591D Other specified fracture of right pubis, subsequent encounter for fracture with routine healing: Secondary | ICD-10-CM | POA: Diagnosis not present

## 2018-06-04 DIAGNOSIS — G3184 Mild cognitive impairment, so stated: Secondary | ICD-10-CM | POA: Diagnosis not present

## 2018-06-04 DIAGNOSIS — E871 Hypo-osmolality and hyponatremia: Secondary | ICD-10-CM | POA: Diagnosis not present

## 2018-06-08 ENCOUNTER — Telehealth: Payer: Self-pay | Admitting: Cardiology

## 2018-06-08 NOTE — Telephone Encounter (Signed)
-----   Message from Juluis Mire, RN sent at 06/07/2018  4:03 PM EDT ----- Regarding: remote check Pts daughter called left a message on afib machine stating pt is currently in [place] [facility] without her transmitter to provide a remote transmission that is scheduled for this week.  Please call to reschedule @ [phone]  [facility] RN

## 2018-06-08 NOTE — Telephone Encounter (Signed)
Spoke w/ pt daughter. Cancelled 06/09/2018 remote appt. Pt daughter will call and reschedule once patient is home.

## 2018-06-19 DIAGNOSIS — M5030 Other cervical disc degeneration, unspecified cervical region: Secondary | ICD-10-CM | POA: Diagnosis not present

## 2018-06-19 DIAGNOSIS — R911 Solitary pulmonary nodule: Secondary | ICD-10-CM | POA: Diagnosis not present

## 2018-06-19 DIAGNOSIS — S3210XD Unspecified fracture of sacrum, subsequent encounter for fracture with routine healing: Secondary | ICD-10-CM | POA: Diagnosis not present

## 2018-06-19 DIAGNOSIS — K5901 Slow transit constipation: Secondary | ICD-10-CM | POA: Diagnosis not present

## 2018-06-19 DIAGNOSIS — Z95810 Presence of automatic (implantable) cardiac defibrillator: Secondary | ICD-10-CM | POA: Diagnosis not present

## 2018-06-19 DIAGNOSIS — I43 Cardiomyopathy in diseases classified elsewhere: Secondary | ICD-10-CM | POA: Diagnosis not present

## 2018-06-19 DIAGNOSIS — E785 Hyperlipidemia, unspecified: Secondary | ICD-10-CM | POA: Diagnosis not present

## 2018-06-19 DIAGNOSIS — S32810D Multiple fractures of pelvis with stable disruption of pelvic ring, subsequent encounter for fracture with routine healing: Secondary | ICD-10-CM | POA: Diagnosis not present

## 2018-06-19 DIAGNOSIS — G3184 Mild cognitive impairment, so stated: Secondary | ICD-10-CM | POA: Diagnosis not present

## 2018-06-19 DIAGNOSIS — Z950 Presence of cardiac pacemaker: Secondary | ICD-10-CM | POA: Diagnosis not present

## 2018-06-19 DIAGNOSIS — W19XXXD Unspecified fall, subsequent encounter: Secondary | ICD-10-CM | POA: Diagnosis not present

## 2018-06-19 DIAGNOSIS — R000 Tachycardia, unspecified: Secondary | ICD-10-CM | POA: Diagnosis not present

## 2018-06-19 DIAGNOSIS — Z7901 Long term (current) use of anticoagulants: Secondary | ICD-10-CM | POA: Diagnosis not present

## 2018-06-19 DIAGNOSIS — I951 Orthostatic hypotension: Secondary | ICD-10-CM | POA: Diagnosis not present

## 2018-06-19 DIAGNOSIS — E890 Postprocedural hypothyroidism: Secondary | ICD-10-CM | POA: Diagnosis not present

## 2018-06-19 DIAGNOSIS — I482 Chronic atrial fibrillation, unspecified: Secondary | ICD-10-CM | POA: Diagnosis not present

## 2018-06-19 DIAGNOSIS — I509 Heart failure, unspecified: Secondary | ICD-10-CM | POA: Diagnosis not present

## 2018-06-22 DIAGNOSIS — R911 Solitary pulmonary nodule: Secondary | ICD-10-CM | POA: Diagnosis not present

## 2018-06-22 DIAGNOSIS — M5030 Other cervical disc degeneration, unspecified cervical region: Secondary | ICD-10-CM | POA: Diagnosis not present

## 2018-06-22 DIAGNOSIS — I509 Heart failure, unspecified: Secondary | ICD-10-CM | POA: Diagnosis not present

## 2018-06-22 DIAGNOSIS — G3184 Mild cognitive impairment, so stated: Secondary | ICD-10-CM | POA: Diagnosis not present

## 2018-06-22 DIAGNOSIS — I951 Orthostatic hypotension: Secondary | ICD-10-CM | POA: Diagnosis not present

## 2018-06-22 DIAGNOSIS — W19XXXD Unspecified fall, subsequent encounter: Secondary | ICD-10-CM | POA: Diagnosis not present

## 2018-06-22 DIAGNOSIS — I482 Chronic atrial fibrillation, unspecified: Secondary | ICD-10-CM | POA: Diagnosis not present

## 2018-06-22 DIAGNOSIS — Z950 Presence of cardiac pacemaker: Secondary | ICD-10-CM | POA: Diagnosis not present

## 2018-06-22 DIAGNOSIS — I43 Cardiomyopathy in diseases classified elsewhere: Secondary | ICD-10-CM | POA: Diagnosis not present

## 2018-06-22 DIAGNOSIS — Z95810 Presence of automatic (implantable) cardiac defibrillator: Secondary | ICD-10-CM | POA: Diagnosis not present

## 2018-06-22 DIAGNOSIS — K5901 Slow transit constipation: Secondary | ICD-10-CM | POA: Diagnosis not present

## 2018-06-22 DIAGNOSIS — S3210XD Unspecified fracture of sacrum, subsequent encounter for fracture with routine healing: Secondary | ICD-10-CM | POA: Diagnosis not present

## 2018-06-22 DIAGNOSIS — R000 Tachycardia, unspecified: Secondary | ICD-10-CM | POA: Diagnosis not present

## 2018-06-22 DIAGNOSIS — S32810D Multiple fractures of pelvis with stable disruption of pelvic ring, subsequent encounter for fracture with routine healing: Secondary | ICD-10-CM | POA: Diagnosis not present

## 2018-06-22 DIAGNOSIS — E785 Hyperlipidemia, unspecified: Secondary | ICD-10-CM | POA: Diagnosis not present

## 2018-06-22 DIAGNOSIS — E890 Postprocedural hypothyroidism: Secondary | ICD-10-CM | POA: Diagnosis not present

## 2018-06-22 DIAGNOSIS — Z7901 Long term (current) use of anticoagulants: Secondary | ICD-10-CM | POA: Diagnosis not present

## 2018-06-24 DIAGNOSIS — M5030 Other cervical disc degeneration, unspecified cervical region: Secondary | ICD-10-CM | POA: Diagnosis not present

## 2018-06-24 DIAGNOSIS — E785 Hyperlipidemia, unspecified: Secondary | ICD-10-CM | POA: Diagnosis not present

## 2018-06-24 DIAGNOSIS — I509 Heart failure, unspecified: Secondary | ICD-10-CM | POA: Diagnosis not present

## 2018-06-24 DIAGNOSIS — S3210XD Unspecified fracture of sacrum, subsequent encounter for fracture with routine healing: Secondary | ICD-10-CM | POA: Diagnosis not present

## 2018-06-24 DIAGNOSIS — S32810D Multiple fractures of pelvis with stable disruption of pelvic ring, subsequent encounter for fracture with routine healing: Secondary | ICD-10-CM | POA: Diagnosis not present

## 2018-06-24 DIAGNOSIS — K5901 Slow transit constipation: Secondary | ICD-10-CM | POA: Diagnosis not present

## 2018-06-24 DIAGNOSIS — I43 Cardiomyopathy in diseases classified elsewhere: Secondary | ICD-10-CM | POA: Diagnosis not present

## 2018-06-24 DIAGNOSIS — R911 Solitary pulmonary nodule: Secondary | ICD-10-CM | POA: Diagnosis not present

## 2018-06-24 DIAGNOSIS — I482 Chronic atrial fibrillation, unspecified: Secondary | ICD-10-CM | POA: Diagnosis not present

## 2018-06-24 DIAGNOSIS — Z950 Presence of cardiac pacemaker: Secondary | ICD-10-CM | POA: Diagnosis not present

## 2018-06-24 DIAGNOSIS — R000 Tachycardia, unspecified: Secondary | ICD-10-CM | POA: Diagnosis not present

## 2018-06-24 DIAGNOSIS — Z7901 Long term (current) use of anticoagulants: Secondary | ICD-10-CM | POA: Diagnosis not present

## 2018-06-24 DIAGNOSIS — G3184 Mild cognitive impairment, so stated: Secondary | ICD-10-CM | POA: Diagnosis not present

## 2018-06-24 DIAGNOSIS — I951 Orthostatic hypotension: Secondary | ICD-10-CM | POA: Diagnosis not present

## 2018-06-24 DIAGNOSIS — W19XXXD Unspecified fall, subsequent encounter: Secondary | ICD-10-CM | POA: Diagnosis not present

## 2018-06-24 DIAGNOSIS — E890 Postprocedural hypothyroidism: Secondary | ICD-10-CM | POA: Diagnosis not present

## 2018-06-24 DIAGNOSIS — Z95810 Presence of automatic (implantable) cardiac defibrillator: Secondary | ICD-10-CM | POA: Diagnosis not present

## 2018-06-25 ENCOUNTER — Ambulatory Visit: Payer: Self-pay

## 2018-06-25 NOTE — Telephone Encounter (Signed)
Incoming call from Patient daughter.  Daughter states that Patient is very heistant, Not sure of herself.   Thinks she is in a hotel. Pattern comes and goes, mostly in the am and Patient daughter denies that the Patient takes alcohol and drugs. Denies anyother Sx.  Patient daughter will contact the day nurse to assess behavior on Thursday.  For ex. UTI Sx...etc.  Will call back.    Reason for Disposition . [1] Longstanding confusion (e.g., dementia, stroke) AND [2] NO worsening or change  Answer Assessment - Initial Assessment Questions 1. LEVEL OF CONSCIOUSNESS: "How is he (she, the patient) acting right now?" (e.g., alert-oriented, confused, lethargic, stuporous, comatose)     Not sure where she is hesitant 2. ONSET: "When did the confusion start?"  (minutes, hours, days)     This am ythinks she is in hotel 3. PATTERN "Does this come and go, or has it been constant since it started?"  "Is it present now?"     Comes an goes monring night time 4. ALCOHOL or DRUGS: "Has he been drinking alcohol or taking any drugs?"      denies 5. NARCOTIC MEDICATIONS: "Has he been receiving any narcotic medications?" (e.g., morphine, Vicodin)     denies 6. CAUSE: "What do you think is causing the confusion?"      *No Answer* 7. OTHER SYMPTOMS: "Are there any other symptoms?" (e.g., difficulty breathing, headache, fever, weakness)     denies  Protocols used: CONFUSION - DELIRIUM-A-AH

## 2018-06-28 DIAGNOSIS — E785 Hyperlipidemia, unspecified: Secondary | ICD-10-CM | POA: Diagnosis not present

## 2018-06-28 DIAGNOSIS — I43 Cardiomyopathy in diseases classified elsewhere: Secondary | ICD-10-CM | POA: Diagnosis not present

## 2018-06-28 DIAGNOSIS — G3184 Mild cognitive impairment, so stated: Secondary | ICD-10-CM | POA: Diagnosis not present

## 2018-06-28 DIAGNOSIS — S32810D Multiple fractures of pelvis with stable disruption of pelvic ring, subsequent encounter for fracture with routine healing: Secondary | ICD-10-CM | POA: Diagnosis not present

## 2018-06-28 DIAGNOSIS — S3210XD Unspecified fracture of sacrum, subsequent encounter for fracture with routine healing: Secondary | ICD-10-CM | POA: Diagnosis not present

## 2018-06-28 DIAGNOSIS — Z95810 Presence of automatic (implantable) cardiac defibrillator: Secondary | ICD-10-CM | POA: Diagnosis not present

## 2018-06-28 DIAGNOSIS — W19XXXD Unspecified fall, subsequent encounter: Secondary | ICD-10-CM | POA: Diagnosis not present

## 2018-06-28 DIAGNOSIS — R911 Solitary pulmonary nodule: Secondary | ICD-10-CM | POA: Diagnosis not present

## 2018-06-28 DIAGNOSIS — R000 Tachycardia, unspecified: Secondary | ICD-10-CM | POA: Diagnosis not present

## 2018-06-28 DIAGNOSIS — I951 Orthostatic hypotension: Secondary | ICD-10-CM | POA: Diagnosis not present

## 2018-06-28 DIAGNOSIS — M5030 Other cervical disc degeneration, unspecified cervical region: Secondary | ICD-10-CM | POA: Diagnosis not present

## 2018-06-28 DIAGNOSIS — I482 Chronic atrial fibrillation, unspecified: Secondary | ICD-10-CM | POA: Diagnosis not present

## 2018-06-28 DIAGNOSIS — K5901 Slow transit constipation: Secondary | ICD-10-CM | POA: Diagnosis not present

## 2018-06-28 DIAGNOSIS — I509 Heart failure, unspecified: Secondary | ICD-10-CM | POA: Diagnosis not present

## 2018-06-28 DIAGNOSIS — Z7901 Long term (current) use of anticoagulants: Secondary | ICD-10-CM | POA: Diagnosis not present

## 2018-06-28 DIAGNOSIS — Z950 Presence of cardiac pacemaker: Secondary | ICD-10-CM | POA: Diagnosis not present

## 2018-06-28 DIAGNOSIS — E890 Postprocedural hypothyroidism: Secondary | ICD-10-CM | POA: Diagnosis not present

## 2018-06-29 ENCOUNTER — Ambulatory Visit (INDEPENDENT_AMBULATORY_CARE_PROVIDER_SITE_OTHER): Payer: Medicare Other | Admitting: *Deleted

## 2018-06-29 ENCOUNTER — Other Ambulatory Visit: Payer: Self-pay

## 2018-06-29 DIAGNOSIS — I428 Other cardiomyopathies: Secondary | ICD-10-CM

## 2018-06-29 DIAGNOSIS — I4891 Unspecified atrial fibrillation: Secondary | ICD-10-CM

## 2018-06-30 DIAGNOSIS — Z1231 Encounter for screening mammogram for malignant neoplasm of breast: Secondary | ICD-10-CM | POA: Diagnosis not present

## 2018-06-30 DIAGNOSIS — N644 Mastodynia: Secondary | ICD-10-CM | POA: Diagnosis not present

## 2018-06-30 LAB — CUP PACEART REMOTE DEVICE CHECK
Battery Remaining Longevity: 18 mo
Battery Voltage: 2.92 V
Brady Statistic AP VP Percent: 0 %
Brady Statistic AP VS Percent: 0 %
Brady Statistic AS VP Percent: 99.86 %
Brady Statistic AS VS Percent: 0.14 %
Brady Statistic RA Percent Paced: 0 %
Brady Statistic RV Percent Paced: 99.9 %
Date Time Interrogation Session: 20200512153428
HighPow Impedance: 46 Ohm
HighPow Impedance: 84 Ohm
Implantable Lead Implant Date: 20080908
Implantable Lead Implant Date: 20100121
Implantable Lead Implant Date: 20100121
Implantable Lead Implant Date: 20130704
Implantable Lead Location: 753858
Implantable Lead Location: 753859
Implantable Lead Location: 753860
Implantable Lead Location: 753860
Implantable Lead Model: 4196
Implantable Lead Model: 5076
Implantable Lead Model: 5076
Implantable Lead Model: 7120
Implantable Pulse Generator Implant Date: 20171004
Lead Channel Impedance Value: 342 Ohm
Lead Channel Impedance Value: 456 Ohm
Lead Channel Impedance Value: 475 Ohm
Lead Channel Impedance Value: 475 Ohm
Lead Channel Impedance Value: 551 Ohm
Lead Channel Impedance Value: 836 Ohm
Lead Channel Pacing Threshold Amplitude: 0.875 V
Lead Channel Pacing Threshold Amplitude: 1.75 V
Lead Channel Pacing Threshold Pulse Width: 0.4 ms
Lead Channel Pacing Threshold Pulse Width: 0.8 ms
Lead Channel Sensing Intrinsic Amplitude: 2.375 mV
Lead Channel Sensing Intrinsic Amplitude: 2.375 mV
Lead Channel Setting Pacing Amplitude: 2 V
Lead Channel Setting Pacing Amplitude: 2.75 V
Lead Channel Setting Pacing Pulse Width: 0.4 ms
Lead Channel Setting Pacing Pulse Width: 0.8 ms
Lead Channel Setting Sensing Sensitivity: 0.3 mV

## 2018-07-01 DIAGNOSIS — I43 Cardiomyopathy in diseases classified elsewhere: Secondary | ICD-10-CM | POA: Diagnosis not present

## 2018-07-01 DIAGNOSIS — R911 Solitary pulmonary nodule: Secondary | ICD-10-CM | POA: Diagnosis not present

## 2018-07-01 DIAGNOSIS — E890 Postprocedural hypothyroidism: Secondary | ICD-10-CM | POA: Diagnosis not present

## 2018-07-01 DIAGNOSIS — Z95810 Presence of automatic (implantable) cardiac defibrillator: Secondary | ICD-10-CM | POA: Diagnosis not present

## 2018-07-01 DIAGNOSIS — I951 Orthostatic hypotension: Secondary | ICD-10-CM | POA: Diagnosis not present

## 2018-07-01 DIAGNOSIS — W19XXXD Unspecified fall, subsequent encounter: Secondary | ICD-10-CM | POA: Diagnosis not present

## 2018-07-01 DIAGNOSIS — M5030 Other cervical disc degeneration, unspecified cervical region: Secondary | ICD-10-CM | POA: Diagnosis not present

## 2018-07-01 DIAGNOSIS — I482 Chronic atrial fibrillation, unspecified: Secondary | ICD-10-CM | POA: Diagnosis not present

## 2018-07-01 DIAGNOSIS — G3184 Mild cognitive impairment, so stated: Secondary | ICD-10-CM | POA: Diagnosis not present

## 2018-07-01 DIAGNOSIS — Z7901 Long term (current) use of anticoagulants: Secondary | ICD-10-CM | POA: Diagnosis not present

## 2018-07-01 DIAGNOSIS — E785 Hyperlipidemia, unspecified: Secondary | ICD-10-CM | POA: Diagnosis not present

## 2018-07-01 DIAGNOSIS — Z950 Presence of cardiac pacemaker: Secondary | ICD-10-CM | POA: Diagnosis not present

## 2018-07-01 DIAGNOSIS — S32810D Multiple fractures of pelvis with stable disruption of pelvic ring, subsequent encounter for fracture with routine healing: Secondary | ICD-10-CM | POA: Diagnosis not present

## 2018-07-01 DIAGNOSIS — I509 Heart failure, unspecified: Secondary | ICD-10-CM | POA: Diagnosis not present

## 2018-07-01 DIAGNOSIS — R000 Tachycardia, unspecified: Secondary | ICD-10-CM | POA: Diagnosis not present

## 2018-07-01 DIAGNOSIS — K5901 Slow transit constipation: Secondary | ICD-10-CM | POA: Diagnosis not present

## 2018-07-01 DIAGNOSIS — S3210XD Unspecified fracture of sacrum, subsequent encounter for fracture with routine healing: Secondary | ICD-10-CM | POA: Diagnosis not present

## 2018-07-05 DIAGNOSIS — S3210XD Unspecified fracture of sacrum, subsequent encounter for fracture with routine healing: Secondary | ICD-10-CM | POA: Diagnosis not present

## 2018-07-05 DIAGNOSIS — I482 Chronic atrial fibrillation, unspecified: Secondary | ICD-10-CM | POA: Diagnosis not present

## 2018-07-05 DIAGNOSIS — K5901 Slow transit constipation: Secondary | ICD-10-CM | POA: Diagnosis not present

## 2018-07-05 DIAGNOSIS — Z7901 Long term (current) use of anticoagulants: Secondary | ICD-10-CM | POA: Diagnosis not present

## 2018-07-05 DIAGNOSIS — E785 Hyperlipidemia, unspecified: Secondary | ICD-10-CM | POA: Diagnosis not present

## 2018-07-05 DIAGNOSIS — S32810D Multiple fractures of pelvis with stable disruption of pelvic ring, subsequent encounter for fracture with routine healing: Secondary | ICD-10-CM | POA: Diagnosis not present

## 2018-07-05 DIAGNOSIS — E890 Postprocedural hypothyroidism: Secondary | ICD-10-CM | POA: Diagnosis not present

## 2018-07-05 DIAGNOSIS — Z95810 Presence of automatic (implantable) cardiac defibrillator: Secondary | ICD-10-CM | POA: Diagnosis not present

## 2018-07-05 DIAGNOSIS — R911 Solitary pulmonary nodule: Secondary | ICD-10-CM | POA: Diagnosis not present

## 2018-07-05 DIAGNOSIS — I43 Cardiomyopathy in diseases classified elsewhere: Secondary | ICD-10-CM | POA: Diagnosis not present

## 2018-07-05 DIAGNOSIS — Z950 Presence of cardiac pacemaker: Secondary | ICD-10-CM | POA: Diagnosis not present

## 2018-07-05 DIAGNOSIS — G3184 Mild cognitive impairment, so stated: Secondary | ICD-10-CM | POA: Diagnosis not present

## 2018-07-05 DIAGNOSIS — R000 Tachycardia, unspecified: Secondary | ICD-10-CM | POA: Diagnosis not present

## 2018-07-05 DIAGNOSIS — W19XXXD Unspecified fall, subsequent encounter: Secondary | ICD-10-CM | POA: Diagnosis not present

## 2018-07-05 DIAGNOSIS — M5030 Other cervical disc degeneration, unspecified cervical region: Secondary | ICD-10-CM | POA: Diagnosis not present

## 2018-07-05 DIAGNOSIS — I509 Heart failure, unspecified: Secondary | ICD-10-CM | POA: Diagnosis not present

## 2018-07-05 DIAGNOSIS — I951 Orthostatic hypotension: Secondary | ICD-10-CM | POA: Diagnosis not present

## 2018-07-06 DIAGNOSIS — G3184 Mild cognitive impairment, so stated: Secondary | ICD-10-CM | POA: Diagnosis not present

## 2018-07-06 DIAGNOSIS — E890 Postprocedural hypothyroidism: Secondary | ICD-10-CM

## 2018-07-06 DIAGNOSIS — I43 Cardiomyopathy in diseases classified elsewhere: Secondary | ICD-10-CM

## 2018-07-06 DIAGNOSIS — K5901 Slow transit constipation: Secondary | ICD-10-CM

## 2018-07-06 DIAGNOSIS — Z950 Presence of cardiac pacemaker: Secondary | ICD-10-CM

## 2018-07-06 DIAGNOSIS — Z7901 Long term (current) use of anticoagulants: Secondary | ICD-10-CM

## 2018-07-06 DIAGNOSIS — R911 Solitary pulmonary nodule: Secondary | ICD-10-CM

## 2018-07-06 DIAGNOSIS — I951 Orthostatic hypotension: Secondary | ICD-10-CM | POA: Diagnosis not present

## 2018-07-06 DIAGNOSIS — R000 Tachycardia, unspecified: Secondary | ICD-10-CM

## 2018-07-06 DIAGNOSIS — W19XXXD Unspecified fall, subsequent encounter: Secondary | ICD-10-CM

## 2018-07-06 DIAGNOSIS — S32810D Multiple fractures of pelvis with stable disruption of pelvic ring, subsequent encounter for fracture with routine healing: Secondary | ICD-10-CM | POA: Diagnosis not present

## 2018-07-06 DIAGNOSIS — Z95810 Presence of automatic (implantable) cardiac defibrillator: Secondary | ICD-10-CM

## 2018-07-06 DIAGNOSIS — I509 Heart failure, unspecified: Secondary | ICD-10-CM

## 2018-07-06 DIAGNOSIS — I482 Chronic atrial fibrillation, unspecified: Secondary | ICD-10-CM | POA: Diagnosis not present

## 2018-07-06 DIAGNOSIS — M5030 Other cervical disc degeneration, unspecified cervical region: Secondary | ICD-10-CM

## 2018-07-06 DIAGNOSIS — E785 Hyperlipidemia, unspecified: Secondary | ICD-10-CM

## 2018-07-06 DIAGNOSIS — S3210XD Unspecified fracture of sacrum, subsequent encounter for fracture with routine healing: Secondary | ICD-10-CM | POA: Diagnosis not present

## 2018-07-08 DIAGNOSIS — I509 Heart failure, unspecified: Secondary | ICD-10-CM | POA: Diagnosis not present

## 2018-07-08 DIAGNOSIS — Z950 Presence of cardiac pacemaker: Secondary | ICD-10-CM | POA: Diagnosis not present

## 2018-07-08 DIAGNOSIS — R000 Tachycardia, unspecified: Secondary | ICD-10-CM | POA: Diagnosis not present

## 2018-07-08 DIAGNOSIS — M5030 Other cervical disc degeneration, unspecified cervical region: Secondary | ICD-10-CM | POA: Diagnosis not present

## 2018-07-08 DIAGNOSIS — G3184 Mild cognitive impairment, so stated: Secondary | ICD-10-CM | POA: Diagnosis not present

## 2018-07-08 DIAGNOSIS — E890 Postprocedural hypothyroidism: Secondary | ICD-10-CM | POA: Diagnosis not present

## 2018-07-08 DIAGNOSIS — S3210XD Unspecified fracture of sacrum, subsequent encounter for fracture with routine healing: Secondary | ICD-10-CM | POA: Diagnosis not present

## 2018-07-08 DIAGNOSIS — S32810D Multiple fractures of pelvis with stable disruption of pelvic ring, subsequent encounter for fracture with routine healing: Secondary | ICD-10-CM | POA: Diagnosis not present

## 2018-07-08 DIAGNOSIS — R911 Solitary pulmonary nodule: Secondary | ICD-10-CM | POA: Diagnosis not present

## 2018-07-08 DIAGNOSIS — Z95810 Presence of automatic (implantable) cardiac defibrillator: Secondary | ICD-10-CM | POA: Diagnosis not present

## 2018-07-08 DIAGNOSIS — W19XXXD Unspecified fall, subsequent encounter: Secondary | ICD-10-CM | POA: Diagnosis not present

## 2018-07-08 DIAGNOSIS — E785 Hyperlipidemia, unspecified: Secondary | ICD-10-CM | POA: Diagnosis not present

## 2018-07-08 DIAGNOSIS — I482 Chronic atrial fibrillation, unspecified: Secondary | ICD-10-CM | POA: Diagnosis not present

## 2018-07-08 DIAGNOSIS — I951 Orthostatic hypotension: Secondary | ICD-10-CM | POA: Diagnosis not present

## 2018-07-08 DIAGNOSIS — I43 Cardiomyopathy in diseases classified elsewhere: Secondary | ICD-10-CM | POA: Diagnosis not present

## 2018-07-08 DIAGNOSIS — K5901 Slow transit constipation: Secondary | ICD-10-CM | POA: Diagnosis not present

## 2018-07-08 DIAGNOSIS — Z7901 Long term (current) use of anticoagulants: Secondary | ICD-10-CM | POA: Diagnosis not present

## 2018-07-12 DIAGNOSIS — W19XXXD Unspecified fall, subsequent encounter: Secondary | ICD-10-CM | POA: Diagnosis not present

## 2018-07-12 DIAGNOSIS — Z950 Presence of cardiac pacemaker: Secondary | ICD-10-CM | POA: Diagnosis not present

## 2018-07-12 DIAGNOSIS — I951 Orthostatic hypotension: Secondary | ICD-10-CM | POA: Diagnosis not present

## 2018-07-12 DIAGNOSIS — Z95810 Presence of automatic (implantable) cardiac defibrillator: Secondary | ICD-10-CM | POA: Diagnosis not present

## 2018-07-12 DIAGNOSIS — E785 Hyperlipidemia, unspecified: Secondary | ICD-10-CM | POA: Diagnosis not present

## 2018-07-12 DIAGNOSIS — K5901 Slow transit constipation: Secondary | ICD-10-CM | POA: Diagnosis not present

## 2018-07-12 DIAGNOSIS — I43 Cardiomyopathy in diseases classified elsewhere: Secondary | ICD-10-CM | POA: Diagnosis not present

## 2018-07-12 DIAGNOSIS — R911 Solitary pulmonary nodule: Secondary | ICD-10-CM | POA: Diagnosis not present

## 2018-07-12 DIAGNOSIS — S3210XD Unspecified fracture of sacrum, subsequent encounter for fracture with routine healing: Secondary | ICD-10-CM | POA: Diagnosis not present

## 2018-07-12 DIAGNOSIS — R000 Tachycardia, unspecified: Secondary | ICD-10-CM | POA: Diagnosis not present

## 2018-07-12 DIAGNOSIS — M5030 Other cervical disc degeneration, unspecified cervical region: Secondary | ICD-10-CM | POA: Diagnosis not present

## 2018-07-12 DIAGNOSIS — Z7901 Long term (current) use of anticoagulants: Secondary | ICD-10-CM | POA: Diagnosis not present

## 2018-07-12 DIAGNOSIS — G3184 Mild cognitive impairment, so stated: Secondary | ICD-10-CM | POA: Diagnosis not present

## 2018-07-12 DIAGNOSIS — I482 Chronic atrial fibrillation, unspecified: Secondary | ICD-10-CM | POA: Diagnosis not present

## 2018-07-12 DIAGNOSIS — E890 Postprocedural hypothyroidism: Secondary | ICD-10-CM | POA: Diagnosis not present

## 2018-07-12 DIAGNOSIS — S32810D Multiple fractures of pelvis with stable disruption of pelvic ring, subsequent encounter for fracture with routine healing: Secondary | ICD-10-CM | POA: Diagnosis not present

## 2018-07-12 DIAGNOSIS — I509 Heart failure, unspecified: Secondary | ICD-10-CM | POA: Diagnosis not present

## 2018-07-13 ENCOUNTER — Ambulatory Visit: Payer: Self-pay | Admitting: *Deleted

## 2018-07-13 DIAGNOSIS — Z7901 Long term (current) use of anticoagulants: Secondary | ICD-10-CM | POA: Diagnosis not present

## 2018-07-13 DIAGNOSIS — I4891 Unspecified atrial fibrillation: Secondary | ICD-10-CM | POA: Diagnosis not present

## 2018-07-13 NOTE — Telephone Encounter (Signed)
Message from [place], [place] sent at 07/13/2018 4:14 PM EDT   Summary: mood changes   Patient's daughter called in stating mother has fallen about a month ago. Patient has been experiencing changes and her daughter is concerned ever since. Patient has become forgetful more and personality changes. Patient's daughter has seen all these changes and more. Would like advise on the next step.            Pt's daughter states that the pt fell in March of this year and now she has been with her in [place] for about 4 weeks. Pt's daughter states that on yesterday she met with social worker at talked about sundowner's. Today pt's daughter states that the pt did not get out of bed until 2 pm doesn't know where she is or whats's going on. Pt is asleep at the time of call and pt's daughter states that he pt has been eating but may only urinate once a day. Pt's daughter states that the pt does not drink a lot due to stage III kidney disease.Pt's daughter that the pt does no have complaints of fever, headache or stomach ache.Pt is  not in [place] and is currently in [place]. Pt's daughter advised to take the pt to the ED if the the pt becomes worse when she wakes up or if she experiences difficulty waking the pt up. Understanding verbalized.Pt's daughter also asking about a  home testing to check the pt's INR levels. Pt's daughter, [name] would like a return call to [phone] with PCP's recommendations. Email address of pt's daughter is [email or web address].  Reason for Disposition . [1] Acting confused (e.g., disoriented, slurred speech) AND [2] brief (now gone)  Answer Assessment - Initial Assessment Questions 1. LEVEL OF CONSCIOUSNESS: "How is he (she, the patient) acting right now?" (e.g., alert-oriented, confused, lethargic, stuporous, comatose)     throuhout the day she is confused and doesn't remember daughter. Aggressive and mean at night. Started a litlt of last night. Today she didn't get out of bed  and her face gets slack and her eyes are not sparkle, flat affect and says mean things. Today she seems scared.  2. ONSET: "When did the confusion start?"  (minutes, hours, days)     Today sleepy and not getting out of bed, but the other days could be related ot sundowners 3. PATTERN "Does this come and go, or has it been constant since it started?"  "Is it present now?"     Comes and goes, pt is asleep at this time 4. ALCOHOL or DRUGS: "Has he been drinking alcohol or taking any drugs?"      Not assessed 5. NARCOTIC MEDICATIONS: "Has he been receiving any narcotic medications?" (e.g., morphine, Vicodin)     Was on narcotics at the facility, was given a pain patch was not on it very long. Not on any narcotics at this time 6. CAUSE: "What do you think is causing the confusion?"      Unsure  7. OTHER SYMPTOMS: "Are there any other symptoms?" (e.g., difficulty breathing, headache, fever, weakness) No  Protocols used: [place]

## 2018-07-14 ENCOUNTER — Ambulatory Visit (INDEPENDENT_AMBULATORY_CARE_PROVIDER_SITE_OTHER): Payer: Medicare Other | Admitting: Internal Medicine

## 2018-07-14 ENCOUNTER — Other Ambulatory Visit: Payer: Self-pay

## 2018-07-14 DIAGNOSIS — N183 Chronic kidney disease, stage 3 unspecified: Secondary | ICD-10-CM

## 2018-07-14 DIAGNOSIS — S32591A Other specified fracture of right pubis, initial encounter for closed fracture: Secondary | ICD-10-CM | POA: Diagnosis not present

## 2018-07-14 DIAGNOSIS — N179 Acute kidney failure, unspecified: Secondary | ICD-10-CM

## 2018-07-14 DIAGNOSIS — R42 Dizziness and giddiness: Secondary | ICD-10-CM | POA: Diagnosis not present

## 2018-07-14 DIAGNOSIS — Z7901 Long term (current) use of anticoagulants: Secondary | ICD-10-CM

## 2018-07-14 MED ORDER — MECLIZINE HCL 12.5 MG PO TABS: 12.5000 mg | ORAL_TABLET | Freq: Three times a day (TID) | ORAL | 2 refills | Status: DC | PRN

## 2018-07-14 MED ORDER — LEVOTHYROXINE SODIUM 75 MCG PO TABS: 75.0000 ug | ORAL_TABLET | Freq: Every day | ORAL | 3 refills | Status: DC

## 2018-07-14 NOTE — Telephone Encounter (Signed)
Ideally would need in person ROV, thanks

## 2018-07-14 NOTE — Patient Instructions (Addendum)
OK to take 1.5 tabs coumadin tonight (7.5 mg)  I will ask Jenny Reichmann to address your INR further tomorro  Please continue all other medications as before, and refills have been done if requested - the meclizine  Please have the pharmacy call with any other refills you may need.  Please continue your efforts at being more active, low cholesterol diet, and weight control.  You are otherwise up to date with prevention measures today.  Please keep your appointments with your specialists as you may have planned

## 2018-07-14 NOTE — Telephone Encounter (Signed)
Patient was seen in the ED last night.  I have scheduled patient for a doxy this evening with Dr. Jenny Reichmann.

## 2018-07-14 NOTE — Telephone Encounter (Signed)
NOTED  DOXY LINK HAS BEEN SENT

## 2018-07-14 NOTE — Progress Notes (Signed)
Patient ID: Michele [name], female   DOB: 1929/05/07, 85 y.o.   MRN: [id number]  Virtual Visit via Video Note  I connected with [name] on 07/14/18 at  7:20 PM EDT by a video enabled telemedicine application and verified that I am speaking with the correct person using two identifiers.  Location: Patient: at home with daughter who gives hx Provider: at office   I discussed the limitations of evaluation and management by telemedicine and the availability of in person appointments. The patient expressed understanding and agreed to proceed.  History of Present Illness: Here to f/u; overall doing ok,  Pt denies chest pain, increasing sob or doe, wheezing, orthopnea, PND, increased LE swelling, palpitations, or syncope.  Pt denies new neurological symptoms such as new headache, or facial or extremity weakness or numbness.  Pt denies polydipsia, polyuria, or low sugar episode.  Pt states overall good compliance with meds.  Still having some recurring vertigo without falls, asks for meclizine prn refill, but also having some lightheadedness, possibly mildly low po intake as daughter fearful of volume overload for her.  Had recent pelvic fx now with resolved pain, ambulates stiffly but no recent falls.  Pt has had several episodes of confusion c/w sundowning, but during days seems back to baseline Dementia overall stable symptomatically, and not assoc with behavioral changes such as hallucinations, paranoia, or agitation.  INR earlier today 1.5 per [facility] blood draw.  Currently now on coumadin 5 qd except for 7.5 on Tuesdays Past Medical History:  Diagnosis Date  . Atrial fibrillation ([place])    NEG STRESS CARDIOLITE  . C. difficile colitis   . Cardiomyopathy    RATE RELATED  . Cataract   . CHF (congestive heart failure) ([place])   . Diverticulitis    PMH of X 2  . Diverticulosis    DIVERTICULITIS  . Gilbert's syndrome   . Hx of colonic polyp 2011   Dr Fuller Plan  . Hyperlipidemia    NMR LIOPROFILE  LDL 234(3206/2234)HDL 37,TG86  . Renal insufficiency 12/09/2015  . Thyroid disease    HYPOTHYROIDISM...THYROID NODULES   Past Surgical History:  Procedure Laterality Date  . ABDOMINAL HYSTERECTOMY  1973  . APPENDECTOMY  1956  . BIOPSY THYROID     SINGLE NODULE  . BREAST BIOPSY  1997  . CARDIAC DEFIBRILLATOR PLACEMENT    . CHOLECYSTECTOMY  1972  . colonoscopy with polypectomy  2011  . EP IMPLANTABLE DEVICE N/A 11/21/2015   Procedure: BIV ICD Generator Changeout;  Surgeon: Deboraha Sprang, MD;  Location: [place] CV LAB;  Service: Cardiovascular;  Laterality: N/A;  . ICD....02/2008    . LEAD REVISION N/A 08/21/2011   Procedure: LEAD REVISION;  Surgeon: Deboraha Sprang, MD;  Location: [facility] CATH LAB;  Service: Cardiovascular;  Laterality: N/A;  . PACEMAKER PLACEMENT     10/2006  . ROTATOR CUFF REPAIR     x2  . THROIDECTOMY 12/08     benign    reports that she has never smoked. She has never used smokeless tobacco. She reports that she does not drink alcohol or use drugs. family history includes Breast cancer in her sister; Heart attack (age of onset: 17) in her brother; Heart attack (age of onset: 62) in her mother; Lung disease in her father; Ulcerative colitis in her son; Vasculitis in her sister. Allergies  Allergen Reactions  . Cephalexin Hives  . Erythromycin Swelling and Other (See Comments)    REACTION: swollen and sore mouth  .  Terbinafine Hcl     Rash   . Colchicine Other (See Comments)    REACTION: violent diarrhea  . Crestor [Rosuvastatin Calcium] Other (See Comments)    Arm pain  . Rosuvastatin Other (See Comments)    REACTION: upper arm pain bilaterally  . Vytorin [Ezetimibe-Simvastatin] Other (See Comments)    Nightmares with generic   Current Outpatient Medications on File Prior to Visit  Medication Sig Dispense Refill  . acetaminophen (TYLENOL) 325 MG tablet Take 2 tablets (650 mg total) by mouth every 6 (six) hours as needed for mild pain, fever or headache (or  Fever >/= 101). 30 tablet 2  . cholecalciferol (VITAMIN D) 1000 UNITS tablet Take 1,000 Units by mouth daily.    Marland Kitchen senna-docusate (SENOKOT-S) 8.6-50 MG tablet Take 2 tablets by mouth 2 (two) times daily. 120 tablet 2  . warfarin (COUMADIN) 5 MG tablet TAKE 1 TABLET BY MOUTH AS DIRECTED. 35 tablet 1   No current facility-administered medications on file prior to visit.     Observations/Objective: Alert, NAD, appropriate mood and affect, at baseline orientation, resps normal, cn 2-12 intact, moves all 4s, no visible rash or swelling Lab Results  Component Value Date   WBC 10.1 05/19/2018   HGB 11.3 (L) 05/19/2018   HCT 33.1 (L) 05/19/2018   PLT 164 05/19/2018   GLUCOSE 128 (H) 05/19/2018   CHOL 242 (H) 01/23/2017   TRIG 63 01/23/2017   HDL 45 01/23/2017   LDLDIRECT 163.7 06/23/2011   LDLCALC 184 (H) 01/23/2017   ALT 17 03/02/2018   AST 28 03/02/2018   NA 131 (L) 05/19/2018   K 3.4 (L) 05/19/2018   CL 96 (L) 05/19/2018   CREATININE 0.87 05/19/2018   BUN 12 05/19/2018   CO2 25 05/19/2018   TSH 13.90 (H) 03/09/2018   INR 1.5 (A) 07/15/2018   HGBA1C 5.1 01/23/2017   Assessment and Plan: See notes  Follow Up Instructions: See notes   I discussed the assessment and treatment plan with the patient. The patient was provided an opportunity to ask questions and all were answered. The patient agreed with the plan and demonstrated an understanding of the instructions.   The patient was advised to call back or seek an in-person evaluation if the symptoms worsen or if the condition fails to improve as anticipated.  Cathlean Cower, MD

## 2018-07-15 ENCOUNTER — Ambulatory Visit (INDEPENDENT_AMBULATORY_CARE_PROVIDER_SITE_OTHER): Payer: Medicare Other | Admitting: General Practice

## 2018-07-15 DIAGNOSIS — Z5181 Encounter for therapeutic drug level monitoring: Secondary | ICD-10-CM

## 2018-07-15 DIAGNOSIS — I4891 Unspecified atrial fibrillation: Secondary | ICD-10-CM

## 2018-07-15 LAB — POCT INR: INR: 1.5 — AB (ref 2.0–3.0)

## 2018-07-15 NOTE — Progress Notes (Signed)
Remote ICD transmission.   

## 2018-07-15 NOTE — Patient Instructions (Signed)
Pre visit review using our clinic review tool, if applicable. No additional management support is needed unless otherwise documented below in the visit note.  Change dosage and take 1 tablet daily except 1 1/2 tablets on Tuesdays and Fridays.  Re-check in 2 weeks at [employer].  Patient is waiting on a monitor from [employer].

## 2018-07-17 ENCOUNTER — Encounter: Payer: Self-pay | Admitting: Internal Medicine

## 2018-07-17 DIAGNOSIS — R42 Dizziness and giddiness: Secondary | ICD-10-CM | POA: Insufficient documentation

## 2018-07-17 NOTE — Assessment & Plan Note (Addendum)
Recent finding, daughter reports resolved and "all labs good" very recently at the [place] hospital just prior to coming here to stay with daughter, encouraged to drink with thirst and I suspect needs to be a bit more liberal with her oral fluids

## 2018-07-17 NOTE — Assessment & Plan Note (Addendum)
INR subtherapeutic, for total 7.5 mg today (wed) and will ask Cindy boyd to f/u tomorrow for further management  Note:  Total time for pt hx, exam, review of record with pt in the room, determination of diagnoses and plan for further eval and tx is > 40 min, with over 50% spent in coordination and counseling of patient including the differential dx, tx, further evaluation and other management of anticoagulation, AKI, CKD, pelvic fracture and vertigo

## 2018-07-17 NOTE — Assessment & Plan Note (Signed)
To continue meclizine prn

## 2018-07-17 NOTE — Assessment & Plan Note (Signed)
Pain resolved, ambulation to baseline, cont to follow

## 2018-07-17 NOTE — Assessment & Plan Note (Signed)
stable overall by history and exam, recent data reviewed with pt, and pt to continue medical treatment as before,  to f/u any worsening symptoms or concerns, daughter states will fax [place] recent labs results, also to f/u next visit

## 2018-08-23 ENCOUNTER — Other Ambulatory Visit: Payer: Self-pay | Admitting: Internal Medicine

## 2018-09-06 ENCOUNTER — Ambulatory Visit: Payer: Medicare Other | Admitting: Internal Medicine

## 2018-09-15 ENCOUNTER — Other Ambulatory Visit: Payer: Self-pay | Admitting: Internal Medicine

## 2018-09-28 ENCOUNTER — Ambulatory Visit (INDEPENDENT_AMBULATORY_CARE_PROVIDER_SITE_OTHER): Payer: Medicare Other | Admitting: *Deleted

## 2018-09-28 DIAGNOSIS — I428 Other cardiomyopathies: Secondary | ICD-10-CM | POA: Diagnosis not present

## 2018-09-29 LAB — CUP PACEART REMOTE DEVICE CHECK
Battery Remaining Longevity: 17 mo
Battery Voltage: 2.91 V
Brady Statistic AP VP Percent: 0 %
Brady Statistic AP VS Percent: 0 %
Brady Statistic AS VP Percent: 99.92 %
Brady Statistic AS VS Percent: 0.08 %
Brady Statistic RA Percent Paced: 0 %
Brady Statistic RV Percent Paced: 99.95 %
Date Time Interrogation Session: 20200811172306
HighPow Impedance: 46 Ohm
HighPow Impedance: 83 Ohm
Implantable Lead Implant Date: 20080908
Implantable Lead Implant Date: 20100121
Implantable Lead Implant Date: 20100121
Implantable Lead Implant Date: 20130704
Implantable Lead Location: 753858
Implantable Lead Location: 753859
Implantable Lead Location: 753860
Implantable Lead Location: 753860
Implantable Lead Model: 4196
Implantable Lead Model: 5076
Implantable Lead Model: 5076
Implantable Lead Model: 7120
Implantable Pulse Generator Implant Date: 20171004
Lead Channel Impedance Value: 304 Ohm
Lead Channel Impedance Value: 418 Ohm
Lead Channel Impedance Value: 475 Ohm
Lead Channel Impedance Value: 513 Ohm
Lead Channel Impedance Value: 532 Ohm
Lead Channel Impedance Value: 893 Ohm
Lead Channel Pacing Threshold Amplitude: 0.875 V
Lead Channel Pacing Threshold Amplitude: 1.75 V
Lead Channel Pacing Threshold Pulse Width: 0.4 ms
Lead Channel Pacing Threshold Pulse Width: 0.8 ms
Lead Channel Sensing Intrinsic Amplitude: 1.5 mV
Lead Channel Sensing Intrinsic Amplitude: 1.5 mV
Lead Channel Setting Pacing Amplitude: 2 V
Lead Channel Setting Pacing Amplitude: 3 V
Lead Channel Setting Pacing Pulse Width: 0.4 ms
Lead Channel Setting Pacing Pulse Width: 0.8 ms
Lead Channel Setting Sensing Sensitivity: 0.3 mV

## 2018-10-06 ENCOUNTER — Encounter: Payer: Self-pay | Admitting: Cardiology

## 2018-10-06 NOTE — Progress Notes (Signed)
Remote ICD transmission.   

## 2018-11-01 ENCOUNTER — Telehealth: Payer: Self-pay | Admitting: Internal Medicine

## 2018-11-01 ENCOUNTER — Ambulatory Visit: Payer: Medicare Other | Admitting: Internal Medicine

## 2018-11-01 NOTE — Telephone Encounter (Signed)
No, bleeding (if true) is not normal, and highly suggestive of possible cancer, until proven otherwise, such as cervical cancer or uterine cancer.  Pt should see GYN asap.  Please let me know if she will accept a referral

## 2018-11-01 NOTE — Telephone Encounter (Signed)
Called Michele [name] back. She states that the patient refuses the leave the home and is very paranoid. [name] has to go back home to [place] for an appointment in the morning. She is going to see if her sister can come stay with her and bring her to an appointment later in the week.  She also mentioned that [name] has stated that she started her period. The patient's daughter wanted to know if this is normal for someone her age.  Please advise.

## 2018-11-01 NOTE — Telephone Encounter (Signed)
Copied from [place] [phone]. Topic: Appointment Scheduling - Scheduling Inquiry for Clinic >> Nov 01, 2018  4:25 PM Leonides Schanz, [place] wrote: Reason for CRM: Pt daughter Michele [name] stated pt has gone in to attack mode and refuses to leave the home so she will not make the 4:20 appt today

## 2018-11-02 ENCOUNTER — Other Ambulatory Visit: Payer: Self-pay | Admitting: Internal Medicine

## 2018-11-02 DIAGNOSIS — N950 Postmenopausal bleeding: Secondary | ICD-10-CM

## 2018-11-02 NOTE — Telephone Encounter (Signed)
[name], pt's daughter, would like to move forward with the referral for GYN for the pt.

## 2018-11-02 NOTE — Telephone Encounter (Signed)
Ok this is done 

## 2018-11-07 ENCOUNTER — Other Ambulatory Visit: Payer: Self-pay | Admitting: Internal Medicine

## 2018-11-09 ENCOUNTER — Telehealth: Payer: Self-pay

## 2018-11-09 NOTE — Telephone Encounter (Signed)
Spoke to [place], she was calling with an update/FYI for the patient. She stated that she spoke with the pt's daughter and was told the pt is having behavioral issues as well as paranoia to leave the home. t is also having some memory issues. Antoinette mentioned that the neighbor that goes by to check and help the patient is gong to call adult protective services to try to get the help that she needs since the pt is noncompliant to leaving the home in addition to the issues listed above.     Copied from [place] [phone]. Topic: General - Other >> Nov 09, 2018  9:27 AM Pauline Good wrote: Reason for CRM: pt is having extreme behavioral episodes and Antoinette would like a call back from nurse to discuss actions that need to be take

## 2018-11-09 NOTE — Addendum Note (Signed)
Addended by: Biagio Borg on: 11/09/2018 11:44 AM   Modules accepted: Orders

## 2018-11-09 NOTE — Telephone Encounter (Signed)
Ok to ask [facility] to see - done per emr

## 2018-11-11 ENCOUNTER — Other Ambulatory Visit: Payer: Self-pay | Admitting: Internal Medicine

## 2018-11-19 ENCOUNTER — Encounter: Payer: Self-pay | Admitting: *Deleted

## 2018-11-19 ENCOUNTER — Other Ambulatory Visit: Payer: Self-pay | Admitting: *Deleted

## 2018-11-19 NOTE — Patient Outreach (Signed)
[facility]) Care Management  11/19/2018  Michele [name] [birth year] [id number]   Subjective: Telephone call to patient's home  / mobile number, no answer, left HIPAA compliant voicemail message, and requested call back. Telephone call to patient's daughter Michele [name]) home / mobile number, spoke with daughter, daughter stated patient's name, date of birth, and address.  Discussed [place] Medicare MD referral follow up, daughter voiced understanding, and is in agreement to follow up on patient's behalf.   Daughter states she resides in [place], [place], patient's son Michele [name] resides in [place], [place], patient resides in [place], [place], daughter is aware of referral, and is familiar with [name] services. Daughter states she remembers meeting with [name] and Social Worker in the past.  Daughter states patient's memory issues and behaviors are getting worse.  States patient is refusing to eat healthy meals, daughter has meals / groceries delivered, patient refusing to take medicine as prescribed, daughter has medications delivered, patient refusing to go to MD appointment, patient refusing to let people into her home, patient thinks someone is stealing from her, and patient thinks someone in breaking into her home.  Daughters states patient has a neighbor that checks on her daily, neighbor witnessed above issue, neighbor has reported issues to daughter,  and neighbor reported issues to  Adult Scientist, [name].    Daughter patients memory issues wax and wane, memory some days are better than other days, and patient refusing to go to scheduled MD appointments.   Daughter states since patient is refusing to go to MD appointments, there has been no confirmed diagnosis of patient's behavior changes, and patient can not get her medications refilled.   States patient is currently out of coumadin, patient refusing to  go to get coumadin blood level test, MD unable to monitor coumadin levels, and MD will not refill prescription for Coumadin due to lack of blood level monitoring.   Daughter states patient has not taken coumadin in approximately 30 days.  Daughter states she has been contacted by the APS Social Worker ([name], [phone]), who has attempted to do a patient home visit on 2 occasions since referral received, and patient did not answer the door.  Daughter states patient is also receiving services at home through [name], has an assigned Social Worker [phone], [phone]) who completed a home visit with patient, and nurse practitioner from [place] on 11/17/2018.   Daughter states Michele [name] was planning to discuss patient in care team meeting and will call her back with an update.   Daughter states patient has her days and nights mixed up, she advised APS Social worker to attempt to see patient later in the day, instead of in the morning.  Daughter states she is aware that she may need to pursue guardianship for patient due to the above issues, is currently waiting for the outcome of APS referral,  and [name]  home evaluation of available resources through their company.   Daughter states patient has not had home health services or private duty services,  in the past,  and is in agreement to a referral to [name] Social Worker for private duty caregiver community resource identification if [name] is unable to provide this service.  Daughter states also has a home monitoring system ordered by patient's kidney provider and patient has refused to utilize the services.  States patient has cataracts, has been approved to have surgery,  has refused  surgery, and continuing to have poor vision.  Daughter  states patient  is able to manage some of her self care and has assistance as needed.   Daughter states patient lived with her for 7 weeks after patient discharge from local skilled  nursing facility [facility]) after a 5 week stay for rehab,  status post 05/18/2018 -  05/19/2018 hospitalization, and patient decided to go back to her home, and live alone.   Daughter states she and her brother have both offered for patient to live with them as needed,  patient has declined, and stated that she did not want to be a burden to them, even they told her that she was not a burden.  States she is accessing on patient's behalf, patient's [employer] benefits as needed via member services number on back of card.   Discussed [facility] Care Management services, daughter voices understanding, states patient  does not have any education material, transition of care, care coordination, care management, disease monitoring, transportation, or pharmacy needs at this time due to patient's memory issues.   Daughter states her goal for patient is to have patient remain in her home with privately duty caregivers if patient will give consent, until guardianship can be obtained.    RNCM advised daughter, RNCM will reach out to APS, [name] Education officer, museum, [employer] manager, to discussed above, determine next steps, and call daughter back with an update within 4 business days, daughter voices understanding, and is in agreement.    Case discussed with Michele [name] at [facility] [facility] [employer] Management Assistant Director, in agreement with RNCM's follow up plan, states daughter will need to pursue guardianship if patient will not allow anyone in home or respond to outreach attempts, and [facility] Care Management will not be able to provide services.  Telephone call to [employer] at [place] times 2 [phone]), call transitioned to music hold, then call disconnected by recipient, and unable to leave a message.   Telephone call to [place] Worker at [place], no answer, left HIPAA compliant voicemail message, and requested call back. Telephone call to [name] [phone]), call transitioned to  music hold, then call disconnected by recipient, and unable to leave a message.      Objective: Per KPN ([employer], point of care tool) and chart review, patient hospitalized 05/18/2018 - 05/19/2018 for Closed bilateral fracture of pubic rami, status fall.  Patient discharged to [facility] rehab on 05/19/2018 and discharge date unknown.   Patient also has a history of the following: Hypothyroidism, postsurgical, ATRIAL FIBRILLATION on coumadin, CHF (congestive heart failure), Cataract, Diverticulitis, Gilbert's syndrome, Hyperlipidemia, Cardiomyopathy, and colonic polyp.      Assessment: Received [facility] MD Referral on 11/09/2018.  Referral source: Dr. Cathlean Cower.   Referral reason: worsening behavior, Diagnoses of Other Stroke: Ischemic/TIA, Other Diagnosis: dementia with behavior changes.  Screening follow up completed and will refer patient to [place] Social Worker for private duty caregiver community resource identification.      Plan: RNCM will refer patient to [place] Management Social Worker for private duty caregiver community resource identification.   RNCM will follow up with APS Social Worker regarding status of investigation, within 4 business days, if no call back.  RNCM will follow up Education officer, museum at [employer], within 4 business days.  RNCM will follow up with patient's daughter to give update on next steps, within 5 business days.      Willadean Guyton H. Burt Knack Therapist, sports, BSN,  [place] Telephonic CM Phone: [phone] Fax: [phone]

## 2018-11-22 ENCOUNTER — Other Ambulatory Visit: Payer: Self-pay | Admitting: *Deleted

## 2018-11-22 NOTE — Patient Outreach (Addendum)
[facility]) Care Management  11/22/2018  ENOLA HALT [birth year] [id number]   Subjective: Telephone call to [name] (Social Worker at [employer]) [phone]) and HIPPA verified. Advised of this RNCM role, referral sources, referral reason, [facility] Care Management services, and [facility]'s role with [employer].  Antoinette voices understanding, states her company [name] is contracted by [employer], to provide palliative care services for their high risk and frequent ED utilization members.  [name] services include Social Workers, Nursing, Designer, jewellery, MD, and weekly interdisciplinary team meetings. States they can access [facility] for any pharmacy needs for their patients.  States she currently based out of [place], was referred this patient from [employer], and has been working with patient for approximately 2 weeks.  States [name] has been a Therapist, sports for [employer] since February of 2020.  States she and the nurse practitioner made a home visit to the patient on 11/17/2018 , the visit went very well, patient allowed them in the home (daughter and patient's neighbor were aware of the visit, they advised patient of the visit), patient was having a good day, home was very clean, patient for steady on her feet, and having severe memory issues.   States patient has a neighbor that looks out for her and also has a key to patient's home.  States patient has 5 children who are very supportive and all live out of town.  States she has spoken with patient's [name]) on 11/18/2018 and communicated the following plan: [name] will continue to follow up the patient, provide supportive care, encouraged the family to have the patient evaluated by provider for memory issues, son may try to take patient to the providers office, encouraged family to encourage patient to allow providers in patient's home to obtain INR blood work  or patient to go to providers office to obtain blood work,  to continue to seek guardianship, and provide patient with 24 hour care in patient's home if patient will not agree to live with one of her children due to safety concerns.  [name]'s nurse practitioner was able to complete patient's physical assessment during 11/17/2018, is able to order INR if patient agreeable to receive in home services, and per Antoinette, patient not agreeable at the time of the home health visit.  Antoinette states patient also needs someone to administer medications. [name] states she is aware of the Adult Protective Services referral, she encouraged neighbor to make report based on neighbors concerns, and is unaware of the status of the referral.   RNCM gave [name], verbal update on RNCM's patient outreach update, 11/19/2018 conversation with patient's daughter [name]), message left for Adult IT consultant, conversation with [place] Management Assistant Interior and spatial designer, and referral to [place] Management Social Worker.   RNCM will give [facility] Care Management Social Worker update, patient currently has no needs from nursing perspective, will update patient's daughter on the plan, and [place] Management Social Worker will be contact going forward.  [facility] Care Management Social and [name] will collaborate on patient's care as needed.  RNCM and Antoinette in agreement no duplication of services.   [name] will follow up with her supervisor at [place], and advised of above conversation. RNCM also advised Antoinette, RNCM was unable to leave a message for her or speak with anyone at [name] main office on 11/19/2018, states she will report the phone issue, and is appreciative of the update.   Antoinette voiced understanding and is in agreement  to the follow up plan.   Telephone call to Raynaldo Opitz Social Worker at [place] Management advised of above conversation, states she will follow up with  patient's daughter, APS Social Worker, and Chartered certified accountant at [employer].  Patient  does not have any education material, transition of care, or pharmacy needs at this time. Telephone call to patient's daughter mobile number [name]), she stated patient's name, date of birth, and address.  RNCM advised daughter of above conversations, follow up plan from [place] Management, daughter voices understanding, and is in agreement.   States patient's memory is getting worse, patient called her sister this morning at 2:00 am times 2, patient was in her home, patient told her sister she did not know where she was, patient was in her home, and did not remember she was in her home.   [name] states she is on her way to patient's home now, has made an appointment with primary provider for 11/23/2018, for office visit, for coumadin check, and is hoping patient will agree to go.   Daughter planning to pursue guardianship and obtaining 24 hour caregivers.   States she is very appreciative of the follow up and is in agreement to receive [place] Management services.      Objective: Per KPN (Knowledge Performance Now, point of care tool) and chart review, patient hospitalized 05/18/2018 - 05/19/2018 for Closed bilateral fracture of pubic rami, status fall.  Patient discharged to [facility] rehab on 05/19/2018 and discharge date unknown.   Patient also has a history of the following: Hypothyroidism, postsurgical, ATRIAL FIBRILLATION on coumadin, CHF (congestive heart failure), Cataract, Diverticulitis, Gilbert's syndrome, Hyperlipidemia, Cardiomyopathy, and colonic polyp.      Assessment: Received [facility] MD Referral on 11/09/2018.  Referral source: Dr. Cathlean Cower.   Referral reason: worsening behavior, Diagnoses of Other Stroke: Ischemic/TIA, Other Diagnosis: dementia with behavior changes.  Screening follow up completed.   Patient has been referred to [place] Management Social Worker for private duty caregiver  community resource identification and APS referral status follow up.       Plan: RNCM has referred patient to [place] Management Social Worker for private duty caregiver community resource identification and to follow up on APS referral status.        Chieko Neises H. Annia Friendly, BSN, [place] Management [facility] Telephonic CM Phone: [phone] Fax: [phone]

## 2018-11-23 ENCOUNTER — Other Ambulatory Visit (INDEPENDENT_AMBULATORY_CARE_PROVIDER_SITE_OTHER): Payer: Medicare Other

## 2018-11-23 ENCOUNTER — Other Ambulatory Visit: Payer: Self-pay

## 2018-11-23 ENCOUNTER — Encounter: Payer: Self-pay | Admitting: Internal Medicine

## 2018-11-23 ENCOUNTER — Ambulatory Visit (INDEPENDENT_AMBULATORY_CARE_PROVIDER_SITE_OTHER): Payer: Medicare Other | Admitting: General Practice

## 2018-11-23 ENCOUNTER — Ambulatory Visit (INDEPENDENT_AMBULATORY_CARE_PROVIDER_SITE_OTHER): Payer: Medicare Other | Admitting: Internal Medicine

## 2018-11-23 ENCOUNTER — Ambulatory Visit: Payer: Self-pay | Admitting: General Practice

## 2018-11-23 VITALS — BP 126/82 | HR 83 | Temp 98.0°F | Ht 63.0 in | Wt 115.0 lb

## 2018-11-23 DIAGNOSIS — Z23 Encounter for immunization: Secondary | ICD-10-CM | POA: Diagnosis not present

## 2018-11-23 DIAGNOSIS — R413 Other amnesia: Secondary | ICD-10-CM | POA: Diagnosis not present

## 2018-11-23 DIAGNOSIS — Z5181 Encounter for therapeutic drug level monitoring: Secondary | ICD-10-CM

## 2018-11-23 DIAGNOSIS — E559 Vitamin D deficiency, unspecified: Secondary | ICD-10-CM

## 2018-11-23 DIAGNOSIS — R319 Hematuria, unspecified: Secondary | ICD-10-CM

## 2018-11-23 DIAGNOSIS — E785 Hyperlipidemia, unspecified: Secondary | ICD-10-CM | POA: Diagnosis not present

## 2018-11-23 DIAGNOSIS — N1831 Chronic kidney disease, stage 3a: Secondary | ICD-10-CM | POA: Diagnosis not present

## 2018-11-23 DIAGNOSIS — E538 Deficiency of other specified B group vitamins: Secondary | ICD-10-CM | POA: Diagnosis not present

## 2018-11-23 DIAGNOSIS — E890 Postprocedural hypothyroidism: Secondary | ICD-10-CM | POA: Diagnosis not present

## 2018-11-23 DIAGNOSIS — E611 Iron deficiency: Secondary | ICD-10-CM

## 2018-11-23 DIAGNOSIS — Z7901 Long term (current) use of anticoagulants: Secondary | ICD-10-CM

## 2018-11-23 LAB — T4, FREE: Free T4: 0.18 ng/dL — ABNORMAL LOW (ref 0.60–1.60)

## 2018-11-23 LAB — CBC WITH DIFFERENTIAL/PLATELET
Basophils Absolute: 0 10*3/uL (ref 0.0–0.1)
Basophils Relative: 0.7 % (ref 0.0–3.0)
Eosinophils Absolute: 0.1 10*3/uL (ref 0.0–0.7)
Eosinophils Relative: 1.3 % (ref 0.0–5.0)
HCT: 37.1 % (ref 36.0–46.0)
Hemoglobin: 12.4 g/dL (ref 12.0–15.0)
Lymphocytes Relative: 31.5 % (ref 12.0–46.0)
Lymphs Abs: 1.6 10*3/uL (ref 0.7–4.0)
MCHC: 33.5 g/dL (ref 30.0–36.0)
MCV: 96.1 fl (ref 78.0–100.0)
Monocytes Absolute: 0.4 10*3/uL (ref 0.1–1.0)
Monocytes Relative: 7.5 % (ref 3.0–12.0)
Neutro Abs: 2.9 10*3/uL (ref 1.4–7.7)
Neutrophils Relative %: 59 % (ref 43.0–77.0)
Platelets: 217 10*3/uL (ref 150.0–400.0)
RBC: 3.86 Mil/uL — ABNORMAL LOW (ref 3.87–5.11)
RDW: 16.7 % — ABNORMAL HIGH (ref 11.5–15.5)
WBC: 5 10*3/uL (ref 4.0–10.5)

## 2018-11-23 LAB — HEPATIC FUNCTION PANEL
ALT: 32 U/L (ref 0–35)
AST: 29 U/L (ref 0–37)
Albumin: 4.2 g/dL (ref 3.5–5.2)
Alkaline Phosphatase: 52 U/L (ref 39–117)
Bilirubin, Direct: 0.2 mg/dL (ref 0.0–0.3)
Total Bilirubin: 1.6 mg/dL — ABNORMAL HIGH (ref 0.2–1.2)
Total Protein: 7.2 g/dL (ref 6.0–8.3)

## 2018-11-23 LAB — BASIC METABOLIC PANEL
BUN: 26 mg/dL — ABNORMAL HIGH (ref 6–23)
CO2: 29 mEq/L (ref 19–32)
Calcium: 9.5 mg/dL (ref 8.4–10.5)
Chloride: 101 mEq/L (ref 96–112)
Creatinine, Ser: 1.23 mg/dL — ABNORMAL HIGH (ref 0.40–1.20)
GFR: 41.28 mL/min — ABNORMAL LOW (ref >60.00)
Glucose, Bld: 89 mg/dL (ref 70–99)
Potassium: 3.9 mEq/L (ref 3.5–5.1)
Sodium: 136 mEq/L (ref 135–145)

## 2018-11-23 LAB — URINALYSIS, ROUTINE W REFLEX MICROSCOPIC
Bilirubin Urine: NEGATIVE
Hgb urine dipstick: NEGATIVE
Ketones, ur: NEGATIVE
Leukocytes,Ua: NEGATIVE
Nitrite: NEGATIVE
Specific Gravity, Urine: 1.02 (ref 1.000–1.030)
Total Protein, Urine: NEGATIVE
Urine Glucose: NEGATIVE
Urobilinogen, UA: 0.2 (ref 0.0–1.0)
pH: 6 (ref 5.0–8.0)

## 2018-11-23 LAB — LIPID PANEL
Cholesterol: 256 mg/dL — ABNORMAL HIGH (ref 0–200)
HDL: 63 mg/dL (ref >39.00)
LDL Cholesterol: 177 mg/dL — ABNORMAL HIGH (ref 0–99)
NonHDL: 193.49
Total CHOL/HDL Ratio: 4
Triglycerides: 80 mg/dL (ref 0.0–149.0)
VLDL: 16 mg/dL (ref 0.0–40.0)

## 2018-11-23 LAB — TSH: TSH: 72.91 u[IU]/mL — ABNORMAL HIGH (ref 0.35–4.50)

## 2018-11-23 LAB — VITAMIN B12: Vitamin B-12: 539 pg/mL (ref 211–911)

## 2018-11-23 LAB — IBC PANEL
Iron: 76 ug/dL (ref 42–145)
Saturation Ratios: 20 % (ref 20.0–50.0)
Transferrin: 271 mg/dL (ref 212.0–360.0)

## 2018-11-23 LAB — VITAMIN D 25 HYDROXY (VIT D DEFICIENCY, FRACTURES): VITD: 31.01 ng/mL (ref 30.00–100.00)

## 2018-11-23 MED ORDER — RIVAROXABAN 10 MG PO TABS: 10.0000 mg | ORAL_TABLET | Freq: Every day | ORAL | 3 refills | Status: DC

## 2018-11-23 NOTE — Assessment & Plan Note (Signed)
Ok to change the coumadin to xarelto asd,  to f/u any worsening symptoms or concerns

## 2018-11-23 NOTE — Patient Instructions (Signed)
Pre visit review using our clinic review tool, if applicable. No additional management support is needed unless otherwise documented below in the visit note. 

## 2018-11-23 NOTE — Assessment & Plan Note (Signed)
stable overall by history and exam, recent data reviewed with pt, and pt to continue medical treatment as before,  to f/u any worsening symptoms or concerns  

## 2018-11-23 NOTE — Assessment & Plan Note (Signed)
Unclear due to pt hx limited due to memory, for urine studies

## 2018-11-23 NOTE — Assessment & Plan Note (Signed)
For f/u tfts and suspect will be abnormal, to restart thyroid med

## 2018-11-23 NOTE — Assessment & Plan Note (Addendum)
Mild to mod, for b12, refer neurology per daughter request  Note:  Total time for pt hx, exam, review of record with pt in the room, determination of diagnoses and plan for further eval and tx is > 40 min, with over 50% spent in coordination and counseling of patient including the differential dx, tx, further evaluation and other management of memory changes, anticoagulation, hypothryoidism, HLD, CKD, hematuria

## 2018-11-23 NOTE — Patient Instructions (Addendum)
You had the flu shot today  OK to stop the coumadin as you have  Please take all new medication as prescribed - the once per day xarelto  You will be contacted regarding the referral for: neurology  Please continue all other medications as before, and refills have been done if requested.  Please have the pharmacy call with any other refills you may need.  Please continue your efforts at being more active, low cholesterol diet, and weight control.  Please keep your appointments with your specialists as you may have planned  Please go to the LAB in the Basement (turn left off the elevator) for the tests to be done today  You will be contacted by phone if any changes need to be made immediately.  Otherwise, you will receive a letter about your results with an explanation, but please check with MyChart first.  Please remember to sign up for MyChart if you have not done so, as this will be important to you in the future with finding out test results, communicating by private email, and scheduling acute appointments online when needed.  Please return in 6 months, or sooner if needed, with Lab testing done 3-5 days before

## 2018-11-23 NOTE — Progress Notes (Signed)
Subjective:    Patient ID: Michele [name], female    DOB: 1930-01-27, 85 y.o.   MRN: [id number]  HPI   Here with daughter who drove 3 hrs to bring mother here; pt currenlty living alone, has declining cognitive, Not taking the coumadin and thyroid med, and possibly others.  Denies urinary symptoms such as dysuria, frequency, urgency, flank pain, or n/v, fever, chills but may have had mild gross hematuria recently.  Denies hyper or hypo thyroid symptoms such as voice, skin or hair change.  Pt denies chest pain, increased sob or doe, wheezing, orthopnea, PND, increased LE swelling, palpitations, dizziness or syncope.   Pt denies polydipsia, polyuria Past Medical History:  Diagnosis Date  . Atrial fibrillation ([place])    NEG STRESS CARDIOLITE  . C. difficile colitis   . Cardiomyopathy    RATE RELATED  . Cataract   . CHF (congestive heart failure) ([place])   . Diverticulitis    PMH of X 2  . Diverticulosis    DIVERTICULITIS  . Gilbert's syndrome   . Hx of colonic polyp 2011   Dr Fuller Plan  . Hyperlipidemia    NMR LIOPROFILE LDL 234(3206/2234)HDL 37,TG86  . Renal insufficiency 12/09/2015  . Thyroid disease    HYPOTHYROIDISM...THYROID NODULES   Past Surgical History:  Procedure Laterality Date  . ABDOMINAL HYSTERECTOMY  1973  . APPENDECTOMY  1956  . BIOPSY THYROID     SINGLE NODULE  . BREAST BIOPSY  1997  . CARDIAC DEFIBRILLATOR PLACEMENT    . CHOLECYSTECTOMY  1972  . colonoscopy with polypectomy  2011  . EP IMPLANTABLE DEVICE N/A 11/21/2015   Procedure: BIV ICD Generator Changeout;  Surgeon: Deboraha Sprang, MD;  Location: [place] CV LAB;  Service: Cardiovascular;  Laterality: N/A;  . ICD....02/2008    . LEAD REVISION N/A 08/21/2011   Procedure: LEAD REVISION;  Surgeon: Deboraha Sprang, MD;  Location: [facility] CATH LAB;  Service: Cardiovascular;  Laterality: N/A;  . PACEMAKER PLACEMENT     10/2006  . ROTATOR CUFF REPAIR     x2  . THROIDECTOMY 12/08     benign    reports that she has  never smoked. She has never used smokeless tobacco. She reports that she does not drink alcohol or use drugs. family history includes Breast cancer in her sister; Heart attack (age of onset: 68) in her brother; Heart attack (age of onset: 52) in her mother; Lung disease in her father; Ulcerative colitis in her son; Vasculitis in her sister. Allergies  Allergen Reactions  . Cephalexin Hives  . Erythromycin Swelling and Other (See Comments)    REACTION: swollen and sore mouth  . Terbinafine Hcl     Rash   . Colchicine Other (See Comments)    REACTION: violent diarrhea  . Crestor [Rosuvastatin Calcium] Other (See Comments)    Arm pain  . Rosuvastatin Other (See Comments)    REACTION: upper arm pain bilaterally  . Vytorin [Ezetimibe-Simvastatin] Other (See Comments)    Nightmares with generic   Current Outpatient Medications on File Prior to Visit  Medication Sig Dispense Refill  . acetaminophen (TYLENOL) 325 MG tablet Take 2 tablets (650 mg total) by mouth every 6 (six) hours as needed for mild pain, fever or headache (or Fever >/= 101). 30 tablet 2  . cholecalciferol (VITAMIN D) 1000 UNITS tablet Take 1,000 Units by mouth daily.    Marland Kitchen levothyroxine (SYNTHROID) 75 MCG tablet Take 1 tablet (75 mcg total) by mouth daily. [place]  tablet 3  . meclizine (ANTIVERT) 12.5 MG tablet Take 1 tablet (12.5 mg total) by mouth 3 (three) times daily as needed for dizziness. 30 tablet 2  . senna-docusate (SENOKOT-S) 8.6-50 MG tablet Take 2 tablets by mouth 2 (two) times daily. 120 tablet 2  . warfarin (COUMADIN) 5 MG tablet Take 1 tablet daily or as directed by anticoagulation clinic 35 tablet 0   No current facility-administered medications on file prior to visit.    Review of Systems  Constitutional: Negative for other unusual diaphoresis or sweats HENT: Negative for ear discharge or swelling Eyes: Negative for other worsening visual disturbances Respiratory: Negative for stridor or other swelling   Gastrointestinal: Negative for worsening distension or other blood Genitourinary: Negative for retention or other urinary change Musculoskeletal: Negative for other MSK pain or swelling Skin: Negative for color change or other new lesions Neurological: Negative for worsening tremors and other numbness  Psychiatric/Behavioral: Negative for worsening agitation or other fatigue All otherwise neg per pt    Objective:   Physical Exam BP 126/82   Pulse 83   Temp 98 F (36.7 C) (Oral)   Ht 5\' 3"  (1.6 m)   Wt 115 lb (52.2 kg)   SpO2 96%   BMI 20.37 kg/m  VS noted,  Constitutional: Pt appears in NAD HENT: Head: NCAT.  Right Ear: External ear normal.  Left Ear: External ear normal.  Eyes: . Pupils are equal, round, and reactive to light. Conjunctivae and EOM are normal Nose: without d/c or deformity Neck: Neck supple. Gross normal ROM Cardiovascular: Normal rate and regular rhythm.   Pulmonary/Chest: Effort normal and breath sounds without rales or wheezing.  Abd:  Soft, NT, ND, + BS, no organomegaly Neurological: Pt is alert. At baseline orientation, motor grossly intact Skin: Skin is warm. No rashes, other new lesions, no LE edema Psychiatric: Pt behavior is normal without agitation  No other exam findings Lab Results  Component Value Date   WBC 5.0 11/23/2018   HGB 12.4 11/23/2018   HCT 37.1 11/23/2018   PLT 217.0 11/23/2018   GLUCOSE 89 11/23/2018   CHOL 256 (H) 11/23/2018   TRIG 80.0 11/23/2018   HDL 63.00 11/23/2018   LDLDIRECT 163.7 06/23/2011   LDLCALC 177 (H) 11/23/2018   ALT 32 11/23/2018   AST 29 11/23/2018   NA 136 11/23/2018   K 3.9 11/23/2018   CL 101 11/23/2018   CREATININE 1.23 (H) 11/23/2018   BUN 26 (H) 11/23/2018   CO2 29 11/23/2018   TSH 72.91 (H) 11/23/2018   INR 1.5 (A) 07/15/2018   HGBA1C 5.1 01/23/2017        Assessment & Plan:

## 2018-11-24 ENCOUNTER — Encounter: Payer: Self-pay | Admitting: Neurology

## 2018-11-24 ENCOUNTER — Other Ambulatory Visit: Payer: Self-pay | Admitting: *Deleted

## 2018-11-24 NOTE — Patient Outreach (Addendum)
[name]) Care Management  11/24/2018  Michele [name] 03-03-1929 [id number]   Subjective: Telephone call from Newell Coral (Social Worker at [place]) [phone]), HIPAA verified, she states she has spoken with her supervisor regarding our conversation on 11/22/2018, collaboration with [place] Management, supervisor felt it was a great idea, and all in agreement to work together provide services for this patient.    States she had spoken with patient's daughter [name], was informed by daughter that patient was scheduled for follow up appointment with primary MD on 11/23/2018, and has not received an update from daughter.   RNCM advised per chart review, patient had follow up with primary MD on 11/23/2018, and progress notes are still pending.  RNCM advised Raynaldo Opitz Social Worker at [facility] Management was given verbal update, will be following patient going forward, Antoinette verbal given Kelly's contact information [phone]), appreciative of updates, voices understanding, and states she will follow up.        Objective:Per KPN (Knowledge Performance Now, point of care tool) and chart review,patient hospitalized 05/18/2018 - 05/19/2018 forClosed bilateral fracture of pubic rami, status fall. Patient discharged to [facility] rehab on 05/19/2018 and discharge date unknown. Patient also has a history of the following: Hypothyroidism, postsurgical,ATRIAL FIBRILLATIONon coumadin,CHF (congestive heart failure),Cataract,Diverticulitis,Gilbert's syndrome,Hyperlipidemia,Cardiomyopathy, andcolonic polyp.      Assessment: Received [facility] MD Referral on 11/09/2018. Referral source: Dr. Cathlean Cower. Referral reason: worsening behavior,Diagnoses of Other Stroke: Ischemic/TIA,Other Diagnosis: dementia with behavior changes.Screening follow up completed.   Patient has been referred to [place] Management Social Worker forprivate duty  caregiver Chartered certified accountant and APS referral status follow up.      Plan:RNCM hasreferred patient to [place] Management Social Worker forprivate duty caregiver community resource identification and to follow up on APS referral status.       Meesha Sek H. Annia Friendly, BSN, [place] Management [facility] Telephonic CM Phone: [phone] Fax: [phone]

## 2018-11-25 ENCOUNTER — Ambulatory Visit: Payer: Medicare Other | Admitting: Obstetrics & Gynecology

## 2018-11-25 LAB — URINE CULTURE
MICRO NUMBER:: 959177
SPECIMEN QUALITY:: ADEQUATE

## 2018-11-30 ENCOUNTER — Other Ambulatory Visit: Payer: Self-pay

## 2018-11-30 NOTE — Patient Outreach (Signed)
[facility]) Care Management  11/30/2018  Michele [name] 12/03/1929 [id number]   Social work referral received from [facility], [employer].  "Referral for private duty caregiver community resource identification if [name] is unable to provide this service.  Daughter states patient has not had home health services or private duty services, in the past.  You may call patient's [name] to follow up on this referral. " Attempted to contact daughter today but had to leave message.  Will attempt to reach again within four business days.    Ronn Melena, BSW Social Worker [phone]

## 2018-12-02 ENCOUNTER — Other Ambulatory Visit: Payer: Self-pay

## 2018-12-02 NOTE — Patient Outreach (Addendum)
[facility]) Care Management  12/02/2018  JAPJI KLUK 1929/09/02 [id number]     Social work referral received from [facility], [employer].  "Referral for private duty caregiver community resource identification if [name] is unable to provide this service.  Daughter states patient has not had home health services or private duty services, in the past.  You may call patient's [name] to follow up on this referral. " Successful outreach to daughter today.  She reports that she has filed guardianship papers and has a court date set for 01/05/19.  Per daughter, she plans to sell patient's home and have her move into a "senior apartment."  Daughter is seeking private duty caregiver services every day of the week from 11:00am-6:00pm.  Will assist daughter by contacting local agencies regarding cost and availability.   Daughter spoke with APS Social Worker yesterday and informed her that guardianship papers have been filed. Collaborated with Newell Coral, Social Worker at [employer] and provided above information.   Will follow up with daughter after gathering information from caregiver agencies.    Ronn Melena, BSW Social Worker [phone]

## 2018-12-03 ENCOUNTER — Other Ambulatory Visit: Payer: Self-pay

## 2018-12-03 NOTE — Patient Outreach (Signed)
[facility]) Care Management  12/03/2018  KIYAH OUTTEN 1929/09/24 [id number]   Contacted the following agencies regarding availability and cost for in-home aide services: [place], [facility], [place], [place], [facility]. All agencies have aide available for hours being requested.  Daughter inquired about transportation assistance and all agencies confirmed that the aides are able to assist with this. Called patient's daughter and provided her with contact and cost information for each agency. Will follow up within the next two weeks.  Encouraged daughter to call if additional questions/needs arise between now and follow up call.    Ronn Melena, BSW Social Worker [phone]

## 2018-12-09 DIAGNOSIS — H25813 Combined forms of age-related cataract, bilateral: Secondary | ICD-10-CM | POA: Diagnosis not present

## 2018-12-09 DIAGNOSIS — H04123 Dry eye syndrome of bilateral lacrimal glands: Secondary | ICD-10-CM | POA: Diagnosis not present

## 2018-12-09 DIAGNOSIS — G43809 Other migraine, not intractable, without status migrainosus: Secondary | ICD-10-CM | POA: Diagnosis not present

## 2018-12-09 DIAGNOSIS — H40013 Open angle with borderline findings, low risk, bilateral: Secondary | ICD-10-CM | POA: Diagnosis not present

## 2018-12-17 ENCOUNTER — Other Ambulatory Visit: Payer: Self-pay

## 2018-12-17 NOTE — Patient Outreach (Signed)
[facility] [facility]) Care Management  12/17/2018  GRISELL HARP [birth year] [id number]   Successful follow up call to patient' daughter today.  Daughter reports that guardianship hearing has been scheduled.  She has contacted in-home agencies that were provided, however, her wish is for patient to eventually relocate to [place] where she will be closer to family.  Per daughter, patient has a couple of upcoming eye surgeries and will be with her while recovering.  Therefore, aide services are not needed until roughly January.  No other social work needs are identified at this time so case is being closed but did encourage daughter to call if additional needs arise.  Ronn Melena, BSW Social Worker [phone]

## 2018-12-28 ENCOUNTER — Encounter: Payer: Medicare Other | Admitting: *Deleted

## 2018-12-29 DIAGNOSIS — H2512 Age-related nuclear cataract, left eye: Secondary | ICD-10-CM | POA: Diagnosis not present

## 2018-12-30 ENCOUNTER — Telehealth: Payer: Self-pay

## 2018-12-30 ENCOUNTER — Telehealth: Payer: Self-pay | Admitting: *Deleted

## 2018-12-30 NOTE — Telephone Encounter (Signed)
Unable to speak with patient to remind of missed remote transmission LL

## 2018-12-30 NOTE — Telephone Encounter (Signed)
   Primary Cardiologist: Virl Axe, MD  Chart reviewed as part of pre-operative protocol coverage. Cataract extractions are recognized in guidelines as low risk surgeries that do not typically require specific preoperative testing or holding of blood thinner therapy. Therefore, given past medical history and time since last visit, based on ACC/AHA guidelines, BOBBI CLAES would be at acceptable risk for the planned procedure without further cardiovascular testing.   I will route this recommendation to the requesting party via Epic fax function and remove from pre-op pool.  Will defer to the device clinic for recommendations regarding management of PPM in the peri-procedural setting.  Please call with questions.  Abigail Butts, PA-C 12/30/2018, 2:34 PM

## 2018-12-30 NOTE — Telephone Encounter (Signed)
   [facility] Pre-operative Risk Assessment    Request for surgical clearance: PT HAS PACEMAKER AND THEY NEED CLEARANCE FROM DEVICE AS WELL. I WILL GIVE FORM TO DEVICE CLINIC.   1. What type of surgery is being performed? CATARACT SURGERY   2. When is this surgery scheduled? 01/10/19 AND 02/07/19   3. What type of clearance is required (medical clearance vs. Pharmacy clearance to hold med vs. Both)? BOTH  4. Are there any medications that need to be held prior to surgery and how long? [place]   5. Practice name and name of physician performing surgery? [facility]; DR. Bing Plume   6. What is your office phone number [phone]    7.   What is your office fax number [phone]  8.   Anesthesia type (None, local, MAC, general) ? NOT LISTED, TOPICAL?   Julaine Hua 12/30/2018, 2:15 PM  _________________________________________________________________   (provider comments below)

## 2019-01-05 ENCOUNTER — Encounter: Payer: Self-pay | Admitting: *Deleted

## 2019-01-07 ENCOUNTER — Telehealth: Payer: Self-pay

## 2019-01-07 NOTE — Telephone Encounter (Signed)
-----   Message from Mechele Dawley, RN sent at 01/06/2019  6:21 PM EST ----- Regarding: overdue transmission Hi Alyzza Andringa,  Please call Ms. Mazak to assist her with a manual Carelink transmission as she missed her 11/10 remote (disconnected monitor). We will need the transmission in order to provide recommendations her for her upcoming procedure on 11/23. Tried to reach her by MyChart, but no luck.  I also sent [place] a message as Ms. Lebreton is overdue with Dr. Caryl Comes.  Thanks! Raquel Sarna

## 2019-01-07 NOTE — Telephone Encounter (Signed)
-----   Message from Mechele Dawley, RN sent at 01/06/2019  6:21 PM EST ----- Regarding: overdue transmission Hi Zollie Ellery,  Please call Ms. Klepp to assist her with a manual Carelink transmission as she missed her 11/10 remote (disconnected monitor). We will need the transmission in order to provide recommendations her for her upcoming procedure on 11/23. Tried to reach her by MyChart, but no luck.  I also sent [place] a message as Ms. Morsey is overdue with Dr. Caryl Comes.  Thanks! Raquel Sarna

## 2019-01-07 NOTE — Telephone Encounter (Signed)
I called to help the pt send a remote transmission with his home monitor. No answer/no voicemail.

## 2019-01-10 ENCOUNTER — Ambulatory Visit (INDEPENDENT_AMBULATORY_CARE_PROVIDER_SITE_OTHER): Payer: Medicare Other | Admitting: *Deleted

## 2019-01-10 ENCOUNTER — Telehealth: Payer: Self-pay | Admitting: Emergency Medicine

## 2019-01-10 DIAGNOSIS — H25812 Combined forms of age-related cataract, left eye: Secondary | ICD-10-CM | POA: Diagnosis not present

## 2019-01-10 DIAGNOSIS — I429 Cardiomyopathy, unspecified: Secondary | ICD-10-CM | POA: Diagnosis not present

## 2019-01-10 DIAGNOSIS — H2512 Age-related nuclear cataract, left eye: Secondary | ICD-10-CM | POA: Diagnosis not present

## 2019-01-10 LAB — CUP PACEART REMOTE DEVICE CHECK
Battery Remaining Longevity: 15 mo
Battery Voltage: 2.91 V
Brady Statistic AP VP Percent: 0 %
Brady Statistic AP VS Percent: 0 %
Brady Statistic AS VP Percent: 99.94 %
Brady Statistic AS VS Percent: 0.06 %
Brady Statistic RA Percent Paced: 0 %
Brady Statistic RV Percent Paced: 99.96 %
Date Time Interrogation Session: 20201123110147
HighPow Impedance: 47 Ohm
HighPow Impedance: 80 Ohm
Implantable Lead Implant Date: 20080908
Implantable Lead Implant Date: 20100121
Implantable Lead Implant Date: 20100121
Implantable Lead Implant Date: 20130704
Implantable Lead Location: 753858
Implantable Lead Location: 753859
Implantable Lead Location: 753860
Implantable Lead Location: 753860
Implantable Lead Model: 4196
Implantable Lead Model: 5076
Implantable Lead Model: 5076
Implantable Lead Model: 7120
Implantable Pulse Generator Implant Date: 20171004
Lead Channel Impedance Value: 304 Ohm
Lead Channel Impedance Value: 418 Ohm
Lead Channel Impedance Value: 418 Ohm
Lead Channel Impedance Value: 456 Ohm
Lead Channel Impedance Value: 513 Ohm
Lead Channel Impedance Value: 760 Ohm
Lead Channel Pacing Threshold Amplitude: 0.75 V
Lead Channel Pacing Threshold Amplitude: 1.75 V
Lead Channel Pacing Threshold Pulse Width: 0.4 ms
Lead Channel Pacing Threshold Pulse Width: 0.8 ms
Lead Channel Sensing Intrinsic Amplitude: 1.875 mV
Lead Channel Sensing Intrinsic Amplitude: 1.875 mV
Lead Channel Setting Pacing Amplitude: 2 V
Lead Channel Setting Pacing Amplitude: 2.75 V
Lead Channel Setting Pacing Pulse Width: 0.4 ms
Lead Channel Setting Pacing Pulse Width: 0.8 ms
Lead Channel Setting Sensing Sensitivity: 0.3 mV

## 2019-01-10 NOTE — Telephone Encounter (Signed)
Spoke with son . Left message for her to call to send remote transmission. Surgery @ 11:30 today.

## 2019-01-10 NOTE — Telephone Encounter (Signed)
Transmission received and faxed to [place].

## 2019-01-12 ENCOUNTER — Encounter: Payer: Self-pay | Admitting: Internal Medicine

## 2019-01-18 DIAGNOSIS — H2512 Age-related nuclear cataract, left eye: Secondary | ICD-10-CM | POA: Diagnosis not present

## 2019-01-25 ENCOUNTER — Encounter: Payer: Self-pay | Admitting: Neurology

## 2019-01-25 ENCOUNTER — Ambulatory Visit: Payer: Medicare Other | Admitting: Neurology

## 2019-01-25 ENCOUNTER — Other Ambulatory Visit: Payer: Self-pay

## 2019-01-25 VITALS — BP 153/84 | HR 100 | Ht 63.0 in | Wt 120.0 lb

## 2019-01-25 DIAGNOSIS — F0391 Unspecified dementia with behavioral disturbance: Secondary | ICD-10-CM | POA: Diagnosis not present

## 2019-01-25 DIAGNOSIS — Z961 Presence of intraocular lens: Secondary | ICD-10-CM | POA: Diagnosis not present

## 2019-01-25 DIAGNOSIS — F03B18 Unspecified dementia, moderate, with other behavioral disturbance: Secondary | ICD-10-CM

## 2019-01-25 MED ORDER — MEMANTINE HCL 5 MG PO TABS: ORAL_TABLET | ORAL | 11 refills | Status: DC

## 2019-01-25 NOTE — Patient Instructions (Signed)
1. Start Memantine 5mg : take 1 tablet every night for 1 week, then increase to 1 tablet twice a day  2. Follow-up in 6 months, call for any changes.  FALL PRECAUTIONS: Be cautious when walking. Scan the area for obstacles that may increase the risk of trips and falls. When getting up in the mornings, sit up at the edge of the bed for a few minutes before getting out of bed. Consider elevating the bed at the head end to avoid drop of blood pressure when getting up. Walk always in a well-lit room (use night lights in the walls). Avoid area rugs or power cords from appliances in the middle of the walkways. Use a walker or a cane if necessary and consider physical therapy for balance exercise. Get your eyesight checked regularly.  HOME SAFETY: Consider the safety of the kitchen when operating appliances like stoves, microwave oven, and blender. Consider having supervision and share cooking responsibilities until no longer able to participate in those. Accidents with firearms and other hazards in the house should be identified and addressed as well.  ABILITY TO BE LEFT ALONE: If patient is unable to contact 911 operator, consider using LifeLine, or when the need is there, arrange for someone to stay with patients. Smoking is a fire hazard, consider supervision or cessation. Risk of wandering should be assessed by caregiver and if detected at any point, supervision and safe proof recommendations should be instituted.  MEDICATION SUPERVISION: Inability to self-administer medication needs to be constantly addressed. Implement a mechanism to ensure safe administration of the medications.  RECOMMENDATIONS FOR ALL PATIENTS WITH MEMORY PROBLEMS: 1. Continue to exercise (Recommend 30 minutes of walking everyday, or 3 hours every week) 2. Increase social interactions - continue going to [place] and enjoy social gatherings with friends and family 3. Eat healthy, avoid fried foods and eat more fruits and  vegetables 4. Maintain adequate blood pressure, blood sugar, and blood cholesterol level. Reducing the risk of stroke and cardiovascular disease also helps promoting better memory. 5. Avoid stressful situations. Live a simple life and avoid aggravations. Organize your time and prepare for the next day in anticipation. 6. Sleep well, avoid any interruptions of sleep and avoid any distractions in the bedroom that may interfere with adequate sleep quality 7. Avoid sugar, avoid sweets as there is a strong link between excessive sugar intake, diabetes, and cognitive impairment The Mediterranean diet has been shown to help patients reduce the risk of progressive memory disorders and reduces cardiovascular risk. This includes eating fish, eat fruits and green leafy vegetables, nuts like almonds and hazelnuts, walnuts, and also use olive oil. Avoid fast foods and fried foods as much as possible. Avoid sweets and sugar as sugar use has been linked to worsening of memory function.  There is always a concern of gradual progression of memory problems. If this is the case, then we may need to adjust level of care according to patient needs. Support, both to the patient and caregiver, should then be put into place.
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
## 2019-02-07 DIAGNOSIS — Z961 Presence of intraocular lens: Secondary | ICD-10-CM | POA: Diagnosis not present

## 2019-02-07 DIAGNOSIS — H2511 Age-related nuclear cataract, right eye: Secondary | ICD-10-CM | POA: Diagnosis not present

## 2019-02-07 DIAGNOSIS — H25811 Combined forms of age-related cataract, right eye: Secondary | ICD-10-CM | POA: Diagnosis not present

## 2019-02-08 DIAGNOSIS — Z961 Presence of intraocular lens: Secondary | ICD-10-CM | POA: Diagnosis not present

## 2019-02-09 DIAGNOSIS — H2511 Age-related nuclear cataract, right eye: Secondary | ICD-10-CM | POA: Diagnosis not present

## 2019-02-16 ENCOUNTER — Other Ambulatory Visit: Payer: Self-pay | Admitting: Neurology

## 2019-04-10 ENCOUNTER — Ambulatory Visit: Payer: Medicare Other | Attending: Internal Medicine

## 2019-04-10 DIAGNOSIS — Z23 Encounter for immunization: Secondary | ICD-10-CM | POA: Insufficient documentation

## 2019-04-10 NOTE — Progress Notes (Signed)
   Covid-19 Vaccination Clinic  Name:  Michele [name]    MRN: [id number] DOB: [birth year]  04/10/2019  [name] was observed post Covid-19 immunization for 15 minutes without incidence. She was provided with Vaccine Information Sheet and instruction to access the V-Safe system.   [name] was instructed to call 911 with any severe reactions post vaccine: Marland Kitchen Difficulty breathing  . Swelling of your face and throat  . A fast heartbeat  . A bad rash all over your body  . Dizziness and weakness    Immunizations Administered    Name Date Dose VIS Date Route   Pfizer COVID-19 Vaccine 04/10/2019  1:54 PM 0.3 mL 01/28/2019 Intramuscular   Manufacturer: [place]   Lot: [id number]   [place]: [id number]

## 2019-04-11 ENCOUNTER — Ambulatory Visit (INDEPENDENT_AMBULATORY_CARE_PROVIDER_SITE_OTHER): Payer: Medicare Other | Admitting: *Deleted

## 2019-04-11 DIAGNOSIS — I429 Cardiomyopathy, unspecified: Secondary | ICD-10-CM | POA: Diagnosis not present

## 2019-04-11 LAB — CUP PACEART REMOTE DEVICE CHECK
Battery Remaining Longevity: 15 mo
Battery Voltage: 2.9 V
Brady Statistic AP VP Percent: 0 %
Brady Statistic AP VS Percent: 0 %
Brady Statistic AS VP Percent: 99.73 %
Brady Statistic AS VS Percent: 0.27 %
Brady Statistic RA Percent Paced: 0 %
Brady Statistic RV Percent Paced: 99.78 %
Date Time Interrogation Session: 20210222052706
HighPow Impedance: 48 Ohm
HighPow Impedance: 84 Ohm
Implantable Lead Implant Date: 20080908
Implantable Lead Implant Date: 20100121
Implantable Lead Implant Date: 20100121
Implantable Lead Implant Date: 20130704
Implantable Lead Location: 753858
Implantable Lead Location: 753859
Implantable Lead Location: 753860
Implantable Lead Location: 753860
Implantable Lead Model: 4196
Implantable Lead Model: 5076
Implantable Lead Model: 5076
Implantable Lead Model: 7120
Implantable Pulse Generator Implant Date: 20171004
Lead Channel Impedance Value: 304 Ohm
Lead Channel Impedance Value: 418 Ohm
Lead Channel Impedance Value: 475 Ohm
Lead Channel Impedance Value: 513 Ohm
Lead Channel Impedance Value: 513 Ohm
Lead Channel Impedance Value: 874 Ohm
Lead Channel Pacing Threshold Amplitude: 0.75 V
Lead Channel Pacing Threshold Amplitude: 1.875 V
Lead Channel Pacing Threshold Pulse Width: 0.4 ms
Lead Channel Pacing Threshold Pulse Width: 0.8 ms
Lead Channel Sensing Intrinsic Amplitude: 3.375 mV
Lead Channel Sensing Intrinsic Amplitude: 3.375 mV
Lead Channel Setting Pacing Amplitude: 2 V
Lead Channel Setting Pacing Amplitude: 3 V
Lead Channel Setting Pacing Pulse Width: 0.4 ms
Lead Channel Setting Pacing Pulse Width: 0.8 ms
Lead Channel Setting Sensing Sensitivity: 0.3 mV

## 2019-04-12 NOTE — Progress Notes (Signed)
ICD Remote  

## 2019-05-04 ENCOUNTER — Ambulatory Visit: Payer: Medicare Other | Attending: Internal Medicine

## 2019-05-04 DIAGNOSIS — Z23 Encounter for immunization: Secondary | ICD-10-CM

## 2019-05-04 NOTE — Progress Notes (Signed)
   Covid-19 Vaccination Clinic  Name:  Michele [name]    MRN: [id number] DOB: [birth year]  05/04/2019  [name] was observed post Covid-19 immunization for 15 minutes without incident. She was provided with Vaccine Information Sheet and instruction to access the V-Safe system.   [name] was instructed to call 911 with any severe reactions post vaccine: Marland Kitchen Difficulty breathing  . Swelling of face and throat  . A fast heartbeat  . A bad rash all over body  . Dizziness and weakness   Immunizations Administered    Name Date Dose VIS Date Route   Pfizer COVID-19 Vaccine 05/04/2019  1:33 PM 0.3 mL 01/28/2019 Intramuscular   Manufacturer: [place]   Lot: [id number]   [place]: [id number]

## 2019-05-09 DIAGNOSIS — M2041 Other hammer toe(s) (acquired), right foot: Secondary | ICD-10-CM | POA: Diagnosis not present

## 2019-05-09 DIAGNOSIS — M2011 Hallux valgus (acquired), right foot: Secondary | ICD-10-CM | POA: Diagnosis not present

## 2019-05-09 DIAGNOSIS — L603 Nail dystrophy: Secondary | ICD-10-CM | POA: Diagnosis not present

## 2019-05-09 DIAGNOSIS — M2012 Hallux valgus (acquired), left foot: Secondary | ICD-10-CM | POA: Diagnosis not present

## 2019-07-11 ENCOUNTER — Ambulatory Visit (INDEPENDENT_AMBULATORY_CARE_PROVIDER_SITE_OTHER): Payer: Medicare Other | Admitting: *Deleted

## 2019-07-11 DIAGNOSIS — I4891 Unspecified atrial fibrillation: Secondary | ICD-10-CM

## 2019-07-11 DIAGNOSIS — I429 Cardiomyopathy, unspecified: Secondary | ICD-10-CM | POA: Diagnosis not present

## 2019-07-12 ENCOUNTER — Telehealth: Payer: Self-pay

## 2019-07-12 NOTE — Telephone Encounter (Signed)
Spoke with patient to remind of missed remote transmission 

## 2019-07-13 LAB — CUP PACEART REMOTE DEVICE CHECK
Battery Remaining Longevity: 14 mo
Battery Voltage: 2.89 V
Brady Statistic AP VP Percent: 0 %
Brady Statistic AP VS Percent: 0 %
Brady Statistic AS VP Percent: 99.95 %
Brady Statistic AS VS Percent: 0.05 %
Brady Statistic RA Percent Paced: 0 %
Brady Statistic RV Percent Paced: 99.97 %
Date Time Interrogation Session: 20210525233648
HighPow Impedance: 48 Ohm
HighPow Impedance: 90 Ohm
Implantable Lead Implant Date: 20080908
Implantable Lead Implant Date: 20100121
Implantable Lead Implant Date: 20100121
Implantable Lead Implant Date: 20130704
Implantable Lead Location: 753858
Implantable Lead Location: 753859
Implantable Lead Location: 753860
Implantable Lead Location: 753860
Implantable Lead Model: 4196
Implantable Lead Model: 5076
Implantable Lead Model: 5076
Implantable Lead Model: 7120
Implantable Pulse Generator Implant Date: 20171004
Lead Channel Impedance Value: 342 Ohm
Lead Channel Impedance Value: 418 Ohm
Lead Channel Impedance Value: 513 Ohm
Lead Channel Impedance Value: 532 Ohm
Lead Channel Impedance Value: 589 Ohm
Lead Channel Impedance Value: 950 Ohm
Lead Channel Pacing Threshold Amplitude: 0.625 V
Lead Channel Pacing Threshold Amplitude: 1.375 V
Lead Channel Pacing Threshold Pulse Width: 0.4 ms
Lead Channel Pacing Threshold Pulse Width: 0.8 ms
Lead Channel Sensing Intrinsic Amplitude: 2.5 mV
Lead Channel Sensing Intrinsic Amplitude: 2.5 mV
Lead Channel Setting Pacing Amplitude: 2 V
Lead Channel Setting Pacing Amplitude: 2.5 V
Lead Channel Setting Pacing Pulse Width: 0.4 ms
Lead Channel Setting Pacing Pulse Width: 0.8 ms
Lead Channel Setting Sensing Sensitivity: 0.3 mV

## 2019-07-14 NOTE — Progress Notes (Signed)
Remote ICD transmission.   

## 2019-08-24 ENCOUNTER — Other Ambulatory Visit: Payer: Self-pay

## 2019-08-24 ENCOUNTER — Encounter: Payer: Self-pay | Admitting: Neurology

## 2019-08-24 ENCOUNTER — Telehealth (INDEPENDENT_AMBULATORY_CARE_PROVIDER_SITE_OTHER): Payer: Medicare Other | Admitting: Neurology

## 2019-08-24 VITALS — Ht 62.0 in | Wt 116.0 lb

## 2019-08-24 DIAGNOSIS — F0391 Unspecified dementia with behavioral disturbance: Secondary | ICD-10-CM | POA: Diagnosis not present

## 2019-08-24 DIAGNOSIS — F03B18 Unspecified dementia, moderate, with other behavioral disturbance: Secondary | ICD-10-CM

## 2019-08-24 MED ORDER — MEMANTINE HCL 10 MG PO TABS: 10.0000 mg | ORAL_TABLET | Freq: Two times a day (BID) | ORAL | 3 refills | Status: DC
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
## 2019-08-30 ENCOUNTER — Other Ambulatory Visit: Payer: Self-pay | Admitting: Internal Medicine

## 2019-08-30 NOTE — Telephone Encounter (Signed)
Please refill as per office routine med refill policy (all routine meds refilled for 3 mo or monthly per pt preference up to one year from last visit, then month to month grace period for 3 mo, then further med refills will have to be denied)  

## 2019-09-12 ENCOUNTER — Telehealth: Payer: Self-pay

## 2019-09-12 NOTE — Telephone Encounter (Signed)
Patient's daughter, [name], came into office to drop off forms to be completed. States that she is needing to get her mother into a facility by the end of this week since she is having to take care of a family member for a week and cannot take care of both at the same time. Please advise. Did advise that patient may need an appointment since she has not been seen since 11/2018. CB#: [phone]

## 2019-09-12 NOTE — Telephone Encounter (Signed)
[place] spoke with [place]hele [name] and advised that patient would need an appointment due to not being seen since October 2020 and that paperwork states that information included has been in the last 30 days. Appointment made for Thursday 09/15/2019, states she is unsure she will keep it or not.

## 2019-09-13 NOTE — Telephone Encounter (Signed)
Sent to Dr. John. 

## 2019-09-15 ENCOUNTER — Ambulatory Visit (INDEPENDENT_AMBULATORY_CARE_PROVIDER_SITE_OTHER): Payer: Medicare Other | Admitting: Internal Medicine

## 2019-09-15 ENCOUNTER — Other Ambulatory Visit: Payer: Self-pay

## 2019-09-15 ENCOUNTER — Encounter: Payer: Self-pay | Admitting: Internal Medicine

## 2019-09-15 VITALS — BP 130/82 | HR 87 | Temp 98.1°F | Ht 62.0 in | Wt 128.0 lb

## 2019-09-15 DIAGNOSIS — E785 Hyperlipidemia, unspecified: Secondary | ICD-10-CM | POA: Diagnosis not present

## 2019-09-15 DIAGNOSIS — F0390 Unspecified dementia without behavioral disturbance: Secondary | ICD-10-CM | POA: Diagnosis not present

## 2019-09-15 DIAGNOSIS — Z0000 Encounter for general adult medical examination without abnormal findings: Secondary | ICD-10-CM | POA: Diagnosis not present

## 2019-09-15 DIAGNOSIS — E538 Deficiency of other specified B group vitamins: Secondary | ICD-10-CM | POA: Diagnosis not present

## 2019-09-15 DIAGNOSIS — E559 Vitamin D deficiency, unspecified: Secondary | ICD-10-CM | POA: Diagnosis not present

## 2019-09-15 DIAGNOSIS — E890 Postprocedural hypothyroidism: Secondary | ICD-10-CM

## 2019-09-15 DIAGNOSIS — Z022 Encounter for examination for admission to residential institution: Secondary | ICD-10-CM

## 2019-09-15 DIAGNOSIS — Z0001 Encounter for general adult medical examination with abnormal findings: Secondary | ICD-10-CM

## 2019-09-15 DIAGNOSIS — N1831 Chronic kidney disease, stage 3a: Secondary | ICD-10-CM

## 2019-09-15 DIAGNOSIS — E789 Disorder of lipoprotein metabolism, unspecified: Secondary | ICD-10-CM | POA: Diagnosis not present

## 2019-09-15 HISTORY — DX: Unspecified dementia, unspecified severity, without behavioral disturbance, psychotic disturbance, mood disturbance, and anxiety: F03.90

## 2019-09-15 LAB — COMPLETE METABOLIC PANEL WITH GFR
AG Ratio: 1.4 (calc) (ref 1.0–2.5)
ALT: 20 U/L (ref 6–29)
AST: 21 U/L (ref 10–35)
Albumin: 4 g/dL (ref 3.6–5.1)
Alkaline phosphatase (APISO): 65 U/L (ref 37–153)
BUN/Creatinine Ratio: 21 (calc) (ref 6–22)
BUN: 25 mg/dL (ref 7–25)
CO2: 27 mmol/L (ref 20–32)
Calcium: 9.3 mg/dL (ref 8.6–10.4)
Chloride: 99 mmol/L (ref 98–110)
Creat: 1.18 mg/dL — ABNORMAL HIGH (ref 0.60–0.88)
GFR, Est African American: 48 mL/min/{1.73_m2} — ABNORMAL LOW (ref >=60)
GFR, Est Non African American: 41 mL/min/{1.73_m2} — ABNORMAL LOW (ref >=60)
Globulin: 2.8 g/dL (calc) (ref 1.9–3.7)
Glucose, Bld: 80 mg/dL (ref 65–99)
Potassium: 4.2 mmol/L (ref 3.5–5.3)
Sodium: 135 mmol/L (ref 135–146)
Total Bilirubin: 1.8 mg/dL — ABNORMAL HIGH (ref 0.2–1.2)
Total Protein: 6.8 g/dL (ref 6.1–8.1)

## 2019-09-15 NOTE — Assessment & Plan Note (Signed)
Statin intolerant, cont current tx

## 2019-09-15 NOTE — Progress Notes (Signed)
Subjective:    Patient ID: Michele [name], female    DOB: [birth year], 86 y.o.   MRN: [id number]  HPI  Here for wellness and f/u;  Overall doing ok;  Pt denies Chest pain, worsening SOB, DOE, wheezing, orthopnea, PND, worsening LE edema, palpitations, dizziness or syncope.  Pt denies neurological change such as new headache, facial or extremity weakness.  Pt denies polydipsia, polyuria, or low sugar symptoms. Pt states overall good compliance with treatment and medications, good tolerability, and has been trying to follow appropriate diet.  Pt denies worsening depressive symptoms, suicidal ideation or panic. No fever, night sweats, wt loss, loss of appetite, or other constitutional symptoms.  Pt states good ability with ADL's, has mod fall risk, home safety reviewed and adequate, no other significant changes in hearing or vision, and only occasionally active with exercise.   Has occasional diarrhea.  duaghter with her has several forms to be filled out for pt to stay for 1 wk at [facility] facility for respite care Past Medical History:  Diagnosis Date   Atrial fibrillation ([place])    NEG STRESS CARDIOLITE   C. difficile colitis    Cardiomyopathy    RATE RELATED   Cataract    CHF (congestive heart failure) ([place])    Dementia ([place]) 09/15/2019   Diverticulitis    PMH of X 2   Diverticulosis    DIVERTICULITIS   Gilbert's syndrome    Hx of colonic polyp 2011   Dr Fuller Plan   Hyperlipidemia    NMR LIOPROFILE LDL 234(3206/2234)HDL 37,TG86   Pubic bone fracture ([place]) 2020   Renal insufficiency 12/09/2015   Thyroid disease    HYPOTHYROIDISM...THYROID NODULES   Past Surgical History:  Procedure Laterality Date   ABDOMINAL HYSTERECTOMY  1973   APPENDECTOMY  1956   BIOPSY THYROID     SINGLE NODULE   BREAST BIOPSY  1997   CARDIAC DEFIBRILLATOR PLACEMENT     CATARACT EXTRACTION, BILATERAL     CHOLECYSTECTOMY  1972   colonoscopy with polypectomy  2011   EP IMPLANTABLE DEVICE  N/A 11/21/2015   Procedure: BIV ICD Generator Changeout;  Surgeon: Deboraha Sprang, MD;  Location: [facility];  Service: Cardiovascular;  Laterality: N/A;   ICD....02/2008     LEAD REVISION N/A 08/21/2011   Procedure: LEAD REVISION;  Surgeon: Deboraha Sprang, MD;  [facility] C[facility];  Service: Cardiovascular;  Laterality: N/A;   PACEMAKER PLACEMENT     10/2006   ROTATOR CUFF REPAIR     x2   THROIDECTOMY 12/08     benign    reports that she has never smoked. She has never used smokeless tobacco. She reports that she does not drink alcohol and does not use drugs. family history includes Breast cancer in her sister; Heart attack (age of onset: 9) in her brother; Heart attack (age of onset: 68) in her mother; Lung disease in her father; Ulcerative colitis in her son; Vasculitis in her sister. Allergies  Allergen Reactions   Cephalexin Hives   Erythromycin Swelling and Other (See Comments)    REACTION: swollen and sore mouth   Terbinafine Hcl     Rash    Colchicine Other (See Comments)    REACTION: violent diarrhea   Crestor [Rosuvastatin Calcium] Other (See Comments)    Arm pain   Rosuvastatin Other (See Comments)    REACTION: upper arm pain bilaterally   Vytorin [Ezetimibe-Simvastatin] Other (See Comments)    Nightmares with generic  Current Outpatient Medications on File Prior to Visit  Medication Sig Dispense Refill   levothyroxine (SYNTHROID) 75 MCG tablet TAKE 1 TABLET BY MOUTH EVERY DAY 90 tablet 1   Melatonin-Pyridoxine (MELATIN PO) Take 3 mg by mouth at bedtime.     memantine (NAMENDA) 10 MG tablet Take 1 tablet (10 mg total) by mouth 2 (two) times daily. 180 tablet 3   XARELTO 10 MG TABS tablet Take 10 mg by mouth daily.     No current facility-administered medications on file prior to visit.    Review of Systems All otherwise neg per pt     Objective:  BP (!) 130/82 (BP Location: Left Arm, Patient Position: Sitting, Cuff Size: Large)    Pulse  87    Temp 98.1 F (36.7 C) (Oral)    Ht 5\' 2"  (1.575 m)    Wt 128 lb (58.1 kg)    SpO2 97%    BMI 23.41 kg/m  VS noted,  Constitutional: Pt appears in NAD HENT: Head: NCAT.  Right Ear: External ear normal.  Left Ear: External ear normal.  Eyes: . Pupils are equal, round, and reactive to light. Conjunctivae and EOM are normal Nose: without d/c or deformity Neck: Neck supple. Gross normal ROM Cardiovascular: Normal rate and regular rhythm.   Pulmonary/Chest: Effort normal and breath sounds without rales or wheezing.  Abd:  Soft, NT, ND, + BS, no organomegaly Neurological: Pt is alert. At baseline orientation, motor grossly intact Skin: Skin is warm. No rashes, other new lesions, no LE edema Psychiatric: Pt behavior is normal without agitation  All otherwise neg per pt Lab Results  Component Value Date   WBC 5.5 09/15/2019   HGB 13.7 09/15/2019   HCT 40.2 09/15/2019   PLT 191 09/15/2019   GLUCOSE 80 09/15/2019   CHOL 195 09/15/2019   TRIG 42 09/15/2019   HDL 53 09/15/2019   LDLDIRECT 163.7 06/23/2011   LDLCALC 129 (H) 09/15/2019   ALT 20 09/15/2019   AST 21 09/15/2019   NA 135 09/15/2019   K 4.2 09/15/2019   CL 99 09/15/2019   CREATININE 1.18 (H) 09/15/2019   BUN 25 09/15/2019   CO2 27 09/15/2019   TSH 48.06 (H) 09/15/2019   INR 1.5 (A) 07/15/2018   HGBA1C 5.1 01/23/2017      Assessment & Plan:

## 2019-09-15 NOTE — Patient Instructions (Signed)
Please continue all other medications as before, and refills have been done if requested.  Please have the pharmacy call with any other refills you may need.  Please continue your efforts at being more active, low cholesterol diet, and weight control.  You are otherwise up to date with prevention measures today.  Please keep your appointments with your specialists as you may have planned  Your form should be faxed by tomorrow  Please go to the LAB at the blood drawing area for the tests to be done  You will be contacted by phone if any changes need to be made immediately.  Otherwise, you will receive a letter about your results with an explanation, but please check with MyChart first.  Please remember to sign up for MyChart if you have not done so, as this will be important to you in the future with finding out test results, communicating by private email, and scheduling acute appointments online when needed.  Please make an Appointment to return in 6 months, or sooner if needed

## 2019-09-16 ENCOUNTER — Telehealth: Payer: Self-pay | Admitting: Neurology

## 2019-09-16 LAB — TSH: TSH: 48.06 mIU/L — ABNORMAL HIGH (ref 0.40–4.50)

## 2019-09-16 LAB — LIPID PANEL
Cholesterol: 195 mg/dL (ref <200)
HDL: 53 mg/dL (ref >=50)
LDL Cholesterol (Calc): 129 mg/dL (calc) — ABNORMAL HIGH
Non-HDL Cholesterol (Calc): 142 mg/dL (calc) — ABNORMAL HIGH (ref <130)
Total CHOL/HDL Ratio: 3.7 (calc) (ref <5.0)
Triglycerides: 42 mg/dL (ref <150)

## 2019-09-16 LAB — URINALYSIS, ROUTINE W REFLEX MICROSCOPIC
Bilirubin Urine: NEGATIVE
Glucose, UA: NEGATIVE
Hgb urine dipstick: NEGATIVE
Ketones, ur: NEGATIVE
Leukocytes,Ua: NEGATIVE
Nitrite: NEGATIVE
Protein, ur: NEGATIVE
Specific Gravity, Urine: 1.014 (ref 1.001–1.03)
pH: 6 (ref 5.0–8.0)

## 2019-09-16 LAB — CBC WITH DIFFERENTIAL/PLATELET
Absolute Monocytes: 473 cells/uL (ref 200–950)
Basophils Absolute: 22 cells/uL (ref 0–200)
Basophils Relative: 0.4 %
Eosinophils Absolute: 121 cells/uL (ref 15–500)
Eosinophils Relative: 2.2 %
HCT: 40.2 % (ref 35.0–45.0)
Hemoglobin: 13.7 g/dL (ref 11.7–15.5)
Lymphs Abs: 1727 cells/uL (ref 850–3900)
MCH: 32.2 pg (ref 27.0–33.0)
MCHC: 34.1 g/dL (ref 32.0–36.0)
MCV: 94.6 fL (ref 80.0–100.0)
MPV: 10.1 fL (ref 7.5–12.5)
Monocytes Relative: 8.6 %
Neutro Abs: 3157 cells/uL (ref 1500–7800)
Neutrophils Relative %: 57.4 %
Platelets: 191 10*3/uL (ref 140–400)
RBC: 4.25 10*6/uL (ref 3.80–5.10)
RDW: 13.7 % (ref 11.0–15.0)
Total Lymphocyte: 31.4 %
WBC: 5.5 10*3/uL (ref 3.8–10.8)

## 2019-09-16 LAB — VITAMIN B12: Vitamin B-12: 531 pg/mL (ref 200–1100)

## 2019-09-16 LAB — VITAMIN D 25 HYDROXY (VIT D DEFICIENCY, FRACTURES): Vit D, 25-Hydroxy: 17 ng/mL — ABNORMAL LOW (ref 30–100)

## 2019-09-16 NOTE — Telephone Encounter (Signed)
Forms have been completed, Faxed to [phone], Copy sent to scan.  Spoke with daughter and informed, did not need a copy.

## 2019-09-16 NOTE — Telephone Encounter (Signed)
Done

## 2019-09-16 NOTE — Telephone Encounter (Signed)
Patient's daughter Clarise Cruz called a second time and left a message with the Access Nurse. They are wanting to make sure our office received the respite care admission form for the patient. They would like the cognitive form completed today so she can be admitted by Sunday.

## 2019-09-16 NOTE — Telephone Encounter (Signed)
Patient's daughter Clarise Cruz called in and left a message with the Access Nurse. She faxed some forms over previously for the patient to begin Respite Care on 09/18/19. They need those forms to be faxed to the number listed, so she can begin the Respite care on Sunday

## 2019-09-18 ENCOUNTER — Encounter: Payer: Self-pay | Admitting: Internal Medicine

## 2019-09-18 NOTE — Assessment & Plan Note (Addendum)
stable overall by history and exam, recent data reviewed with pt, and pt to continue medical treatment as before,  to f/u any worsening symptoms or concerns  I spent 21 minutes in addition to time for CPX wellness examination in preparing to see the patient by review of recent labs, imaging and procedures, obtaining and reviewing separately obtained history, communicating with the patient and family or caregiver, ordering medications, tests or procedures, and documenting clinical information in the EHR including the differential Dx, treatment, and any further evaluation and other management of encounter for assisted living forms for respite care, dementia, hypothyroid, hld, ckd

## 2019-09-18 NOTE — Assessment & Plan Note (Signed)
For f/u lab, stable overall by history and exam, recent data reviewed with pt, and pt to continue medical treatment as before,  to f/u any worsening symptoms or concerns 

## 2019-09-18 NOTE — Assessment & Plan Note (Signed)
Status post total thyroidectomy 12 /11/2006 for thyroid nodules.  Pathology revealed multiple adenomatous nodules with oncocytic changes. One parathyroid gland was removed. No evidence of malignant change. In absence of malignant change TSH goal= 1-4.5 because of age ,bone integrity & cardiovascular issues.

## 2019-09-18 NOTE — Assessment & Plan Note (Signed)

## 2019-09-18 NOTE — Assessment & Plan Note (Signed)
Multiple forms filled out for respite care stay, ppD placed yesterday to be read tomorrow

## 2019-10-10 ENCOUNTER — Ambulatory Visit (INDEPENDENT_AMBULATORY_CARE_PROVIDER_SITE_OTHER): Payer: Medicare Other | Admitting: *Deleted

## 2019-10-10 DIAGNOSIS — I4891 Unspecified atrial fibrillation: Secondary | ICD-10-CM | POA: Diagnosis not present

## 2019-10-11 LAB — CUP PACEART REMOTE DEVICE CHECK
Battery Remaining Longevity: 12 mo
Battery Voltage: 2.87 V
Brady Statistic RA Percent Paced: 0 %
Brady Statistic RV Percent Paced: 99.93 %
Date Time Interrogation Session: 20210823042407
HighPow Impedance: 49 Ohm
HighPow Impedance: 83 Ohm
Implantable Lead Implant Date: 20080908
Implantable Lead Implant Date: 20100121
Implantable Lead Implant Date: 20100121
Implantable Lead Implant Date: 20130704
Implantable Lead Location: 753858
Implantable Lead Location: 753859
Implantable Lead Location: 753860
Implantable Lead Location: 753860
Implantable Lead Model: 4196
Implantable Lead Model: 5076
Implantable Lead Model: 5076
Implantable Lead Model: 7120
Implantable Pulse Generator Implant Date: 20171004
Lead Channel Impedance Value: 304 Ohm
Lead Channel Impedance Value: 418 Ohm
Lead Channel Impedance Value: 475 Ohm
Lead Channel Impedance Value: 513 Ohm
Lead Channel Impedance Value: 532 Ohm
Lead Channel Impedance Value: 893 Ohm
Lead Channel Pacing Threshold Amplitude: 0.75 V
Lead Channel Pacing Threshold Amplitude: 1.5 V
Lead Channel Pacing Threshold Pulse Width: 0.4 ms
Lead Channel Pacing Threshold Pulse Width: 0.8 ms
Lead Channel Sensing Intrinsic Amplitude: 2.25 mV
Lead Channel Sensing Intrinsic Amplitude: 2.25 mV
Lead Channel Setting Pacing Amplitude: 2 V
Lead Channel Setting Pacing Amplitude: 2.5 V
Lead Channel Setting Pacing Pulse Width: 0.4 ms
Lead Channel Setting Pacing Pulse Width: 0.8 ms
Lead Channel Setting Sensing Sensitivity: 0.3 mV

## 2019-10-17 NOTE — Progress Notes (Signed)
Remote ICD transmission.   

## 2019-12-02 ENCOUNTER — Other Ambulatory Visit: Payer: Self-pay | Admitting: Internal Medicine

## 2019-12-06 ENCOUNTER — Other Ambulatory Visit: Payer: Self-pay | Admitting: Internal Medicine

## 2020-01-09 ENCOUNTER — Ambulatory Visit (INDEPENDENT_AMBULATORY_CARE_PROVIDER_SITE_OTHER): Payer: Medicare Other

## 2020-01-09 DIAGNOSIS — I4891 Unspecified atrial fibrillation: Secondary | ICD-10-CM

## 2020-01-10 ENCOUNTER — Telehealth: Payer: Self-pay

## 2020-01-10 LAB — CUP PACEART REMOTE DEVICE CHECK
Battery Remaining Longevity: 9 mo
Battery Voltage: 2.85 V
Brady Statistic RA Percent Paced: 0 %
Brady Statistic RV Percent Paced: 99.84 %
Date Time Interrogation Session: 20211123093328
HighPow Impedance: 50 Ohm
HighPow Impedance: 92 Ohm
Implantable Lead Implant Date: 20080908
Implantable Lead Implant Date: 20100121
Implantable Lead Implant Date: 20100121
Implantable Lead Implant Date: 20130704
Implantable Lead Location: 753858
Implantable Lead Location: 753859
Implantable Lead Location: 753860
Implantable Lead Location: 753860
Implantable Lead Model: 4196
Implantable Lead Model: 5076
Implantable Lead Model: 5076
Implantable Lead Model: 7120
Implantable Pulse Generator Implant Date: 20171004
Lead Channel Impedance Value: 304 Ohm
Lead Channel Impedance Value: 456 Ohm
Lead Channel Impedance Value: 532 Ohm
Lead Channel Impedance Value: 532 Ohm
Lead Channel Impedance Value: 551 Ohm
Lead Channel Impedance Value: 950 Ohm
Lead Channel Pacing Threshold Amplitude: 0.75 V
Lead Channel Pacing Threshold Amplitude: 1.5 V
Lead Channel Pacing Threshold Pulse Width: 0.4 ms
Lead Channel Pacing Threshold Pulse Width: 0.8 ms
Lead Channel Sensing Intrinsic Amplitude: 2.125 mV
Lead Channel Sensing Intrinsic Amplitude: 2.125 mV
Lead Channel Setting Pacing Amplitude: 2 V
Lead Channel Setting Pacing Amplitude: 2.5 V
Lead Channel Setting Pacing Pulse Width: 0.4 ms
Lead Channel Setting Pacing Pulse Width: 0.8 ms
Lead Channel Setting Sensing Sensitivity: 0.3 mV

## 2020-01-10 NOTE — Telephone Encounter (Signed)
[facility] alert received for increased Optivol/thoracic impedance below reference.   Spoke to [name] (DPR) daughter, states patient has noted swelling in both feet and increased fatigued. Reports patient fell 3 weeks ago (was evaluated in ED) and has become more sedentary recently. States patient has memory issues and does not want to lay in bed or keep feet elevated. Denies noting shortness of breath or any acute symptoms regarding fluid. Informed [name] patient is overdue and we would like to make an in office apt. Advised I will forward to scheduling and someone will call to help set that up.

## 2020-01-16 NOTE — Progress Notes (Signed)
Remote ICD transmission.   

## 2020-01-16 NOTE — Progress Notes (Signed)
Will discuss and update at visit this week .

## 2020-01-19 ENCOUNTER — Other Ambulatory Visit: Payer: Self-pay

## 2020-01-19 ENCOUNTER — Ambulatory Visit (INDEPENDENT_AMBULATORY_CARE_PROVIDER_SITE_OTHER): Payer: Medicare Other | Admitting: Student

## 2020-01-19 ENCOUNTER — Encounter: Payer: Self-pay | Admitting: Student

## 2020-01-19 VITALS — BP 118/78 | HR 71 | Ht 62.0 in | Wt 130.0 lb

## 2020-01-19 DIAGNOSIS — I4891 Unspecified atrial fibrillation: Secondary | ICD-10-CM

## 2020-01-19 DIAGNOSIS — I429 Cardiomyopathy, unspecified: Secondary | ICD-10-CM

## 2020-01-19 DIAGNOSIS — I428 Other cardiomyopathies: Secondary | ICD-10-CM | POA: Diagnosis not present

## 2020-01-19 DIAGNOSIS — R609 Edema, unspecified: Secondary | ICD-10-CM | POA: Diagnosis not present

## 2020-01-19 LAB — CUP PACEART INCLINIC DEVICE CHECK
Battery Remaining Longevity: 8 mo
Battery Voltage: 2.81 V
Brady Statistic RA Percent Paced: 0 %
Brady Statistic RV Percent Paced: 99.84 %
Date Time Interrogation Session: 20211202134454
HighPow Impedance: 46 Ohm
HighPow Impedance: 90 Ohm
Implantable Lead Implant Date: 20080908
Implantable Lead Implant Date: 20100121
Implantable Lead Implant Date: 20100121
Implantable Lead Implant Date: 20130704
Implantable Lead Location: 753858
Implantable Lead Location: 753859
Implantable Lead Location: 753860
Implantable Lead Location: 753860
Implantable Lead Model: 4196
Implantable Lead Model: 5076
Implantable Lead Model: 5076
Implantable Lead Model: 7120
Implantable Pulse Generator Implant Date: 20171004
Lead Channel Impedance Value: 304 Ohm
Lead Channel Impedance Value: 418 Ohm
Lead Channel Impedance Value: 475 Ohm
Lead Channel Impedance Value: 513 Ohm
Lead Channel Impedance Value: 513 Ohm
Lead Channel Impedance Value: 836 Ohm
Lead Channel Pacing Threshold Amplitude: 1 V
Lead Channel Pacing Threshold Amplitude: 2 V
Lead Channel Pacing Threshold Pulse Width: 0.4 ms
Lead Channel Pacing Threshold Pulse Width: 0.8 ms
Lead Channel Sensing Intrinsic Amplitude: 1.25 mV
Lead Channel Sensing Intrinsic Amplitude: 3.625 mV
Lead Channel Setting Pacing Amplitude: 2 V
Lead Channel Setting Pacing Amplitude: 3 V
Lead Channel Setting Pacing Pulse Width: 0.4 ms
Lead Channel Setting Pacing Pulse Width: 0.8 ms
Lead Channel Setting Sensing Sensitivity: 0.3 mV

## 2020-01-19 NOTE — Telephone Encounter (Signed)
Does she have follow up scheduled  Thx.

## 2020-01-19 NOTE — Progress Notes (Signed)
Electrophysiology Office Note Date: 01/19/2020  ID:  Michele [name], DOB 11-08-1929, MRN [id number]  PCP: Biagio Borg, MD Primary Cardiologist: No primary care provider on file. Electrophysiologist: Virl Axe, MD   CC: Routine ICD follow-up  Michele [name] is a 86 y.o. female seen today for Virl Axe, MD for routine electrophysiology followup.  Since last being seen in our clinic the patient reports doing well overall. She is living with her daughter.  she denies chest pain, palpitations, dyspnea, PND, orthopnea, nausea, vomiting, dizziness, syncope, weight gain, or early satiety. She has not had ICD shocks.   Device History: Medtronic BiV ICD implanted 11/2015 for AF and CHB s/p AV ablation  Past Medical History:  Diagnosis Date  . Atrial fibrillation ([place])    NEG STRESS CARDIOLITE  . C. difficile colitis   . Cardiomyopathy    RATE RELATED  . Cataract   . CHF (congestive heart failure) ([place])   . Dementia ([place]) 09/15/2019  . Diverticulitis    PMH of X 2  . Diverticulosis    DIVERTICULITIS  . Gilbert's syndrome   . Hx of colonic polyp 2011   Dr Fuller Plan  . Hyperlipidemia    NMR LIOPROFILE LDL 234(3206/2234)HDL 37,TG86  . Pubic bone fracture ([place]) 2020  . Renal insufficiency 12/09/2015  . Thyroid disease    HYPOTHYROIDISM...THYROID NODULES   Past Surgical History:  Procedure Laterality Date  . ABDOMINAL HYSTERECTOMY  1973  . APPENDECTOMY  1956  . BIOPSY THYROID     SINGLE NODULE  . BREAST BIOPSY  1997  . CARDIAC DEFIBRILLATOR PLACEMENT    . CATARACT EXTRACTION, BILATERAL    . CHOLECYSTECTOMY  1972  . colonoscopy with polypectomy  2011  . EP IMPLANTABLE DEVICE N/A 11/21/2015   Procedure: BIV ICD Generator Changeout;  Surgeon: Deboraha Sprang, MD;  Location: [facility];  Service: Cardiovascular;  Laterality: N/A;  . ICD....02/2008    . LEAD REVISION N/A 08/21/2011   Procedure: LEAD REVISION;  Surgeon: Deboraha Sprang, MD;  Location: [facility] CATH LAB;   Service: Cardiovascular;  Laterality: N/A;  . PACEMAKER PLACEMENT     10/2006  . ROTATOR CUFF REPAIR     x2  . THROIDECTOMY 12/08     benign    Current Outpatient Medications  Medication Sig Dispense Refill  . levothyroxine (SYNTHROID) 75 MCG tablet TAKE 1 TABLET BY MOUTH EVERY DAY 90 tablet 1  . Melatonin-Pyridoxine (MELATIN PO) Take 3 mg by mouth at bedtime.    . memantine (NAMENDA) 10 MG tablet Take 1 tablet (10 mg total) by mouth 2 (two) times daily. 180 tablet 3  . XARELTO 10 MG TABS tablet TAKE 1 TABLET BY MOUTH EVERY DAY 90 tablet 2   No current facility-administered medications for this visit.    Allergies:   Cephalexin, Erythromycin, Terbinafine hcl, Terbinafine hcl, Colchicine, Crestor [rosuvastatin calcium], Rosuvastatin, and Vytorin [ezetimibe-simvastatin]   Social History: Social History   Socioeconomic History  . Marital status: Widowed    Spouse name: Not on file  . Number of children: 5  . Years of education: Not on file  . Highest education level: Not on file  Occupational History  . Occupation: Retail buyer: RETIRED    Comment: retired  Tobacco Use  . Smoking status: Never Smoker  . Smokeless tobacco: Never Used  Vaping Use  . Vaping Use: Never used  Substance and Sexual Activity  . Alcohol use: No  . Drug use:  No  . Sexual activity: Not Currently  Other Topics Concern  . Not on file  Social History Narrative   PT. MOVED HERE FROM [place] OVER 25 YEARS AGO WITH HUSBAND.she WAS WITH CIB[employer] GEIGY.      Right handed      Lives alone      Completed HS   Social Determinants of Health   Financial Resource Strain:   . Difficulty of Paying Living Expenses: Not on file  Food Insecurity:   . Worried About Charity fundraiser in the Last Year: Not on file  . Ran Out of Food in the Last Year: Not on file  Transportation Needs:   . Lack of Transportation (Medical): Not on file  . Lack of Transportation (Non-Medical): Not on file  Physical  Activity:   . Days of Exercise per Week: Not on file  . Minutes of Exercise per Session: Not on file  Stress:   . Feeling of Stress : Not on file  Social Connections:   . Frequency of Communication with Friends and Family: Not on file  . Frequency of Social Gatherings with Friends and Family: Not on file  . Attends Religious Services: Not on file  . Active Member of Clubs or Organizations: Not on file  . Attends Archivist Meetings: Not on file  . Marital Status: Not on file  Intimate Partner Violence:   . Fear of Current or Ex-Partner: Not on file  . Emotionally Abused: Not on file  . Physically Abused: Not on file  . Sexually Abused: Not on file    Family History: Family History  Problem Relation Age of Onset  . Heart attack Mother 70  . Breast cancer Sister   . Heart attack Brother 71  . Lung disease Father        Silicosis  . Vasculitis Sister        Giant Cell Arteritis  . Ulcerative colitis Son     Review of Systems: All other systems reviewed and are otherwise negative except as noted above.   Physical Exam: Vitals:   01/19/20 1233  BP: 118/78  Pulse: 71  SpO2: 98%  Weight: 130 lb (59 kg)  Height: 5\' 2"  (1.575 m)     GEN- The patient is well appearing, alert and oriented x 3 today.   HEENT: normocephalic, atraumatic; sclera clear, conjunctiva pink; hearing intact; oropharynx clear; neck supple, no JVP Lymph- no cervical lymphadenopathy Lungs- Clear to ausculation bilaterally, normal work of breathing.  No wheezes, rales, rhonchi Heart- Regular rate and rhythm, no murmurs, rubs or gallops, PMI not laterally displaced GI- soft, non-tender, non-distended, bowel sounds present, no hepatosplenomegaly Extremities- no clubbing or cyanosis. No edema; DP/PT/radial pulses 2+ bilaterally MS- no significant deformity or atrophy Skin- warm and dry, no rash or lesion; ICD pocket well healed Psych- euthymic mood, full affect Neuro- strength and sensation are  intact  ICD interrogation- reviewed in detail today,  See PACEART report  EKG:  EKG is ordered today. The ekg ordered today shows AF with controlled ventricular rate/v pacing at 71 bpm  Recent Labs: 09/15/2019: ALT 20; BUN 25; Creat 1.18; Hemoglobin 13.7; Platelets 191; Potassium 4.2; Sodium 135; TSH 48.06   Wt Readings from Last 3 Encounters:  01/19/20 130 lb (59 kg)  09/15/19 128 lb (58.1 kg)  08/24/19 116 lb (52.6 kg)     Other studies Reviewed: Additional studies/ records that were reviewed today include: Previous EP office notes   Assessment and  Plan:  1.  Tachycardia induced cardiomyopathy (resolved) s/p Medtronic CRT-D  Stable on an appropriate medical regimen Normal ICD function See Pace Art report No changes today Battery with approximately 8 months to ERI. We briefly discussed downgrade to CRT-P today and daughter who makes the medical decisions thinks that is what she would want. She will discuss further with her family members and with Dr. Caryl Comes at follow up to discuss gen change in 9 months.   2. Permanent atrial fibrillation Continue coumadin  3. Peripheral edema Dependent. No SOB or orthopnea. Sleeps in recliner chronically for comfort.  Optivol trending towards baseline without intervention.  She has history of orthostasis and renal insufficiency. I think diuretic has potential to do more harm than good at this juncture.  Encouraged compression hose.   Current medicines are reviewed at length with the patient today.   The patient does not have concerns regarding her medicines.  The following changes were made today:  none  Labs/ tests ordered today include:  Orders Placed This Encounter  Procedures  . EKG 12-Lead   Disposition:   Follow up with Dr. Caryl Comes  9 months to discuss gen change with possible downgrade to CRT-P   Signed, Shirley Friar, PA-C  01/19/2020 1:49 PM  [place]  [id number] [phone] (office) [phone] (fax)

## 2020-01-19 NOTE — Patient Instructions (Signed)
Medication Instructions:  *If you need a refill on your cardiac medications before your next appointment, please call your pharmacy*  Follow-Up: At [facility], you and your health needs are our priority.  As part of our continuing mission to provide you with exceptional heart care, we have created designated Provider Care Teams.  These Care Teams include your primary Cardiologist (physician) and Advanced Practice Providers (APPs -  Physician Assistants and Nurse Practitioners) who all work together to provide you with the care you need, when you need it.  We recommend signing up for the patient portal called "MyChart".  Sign up information is provided on this After Visit Summary.  MyChart is used to connect with patients for Virtual Visits (Telemedicine).  Patients are able to view lab/test results, encounter notes, upcoming appointments, etc.  Non-urgent messages can be sent to your provider as well.   To learn more about what you can do with MyChart, go to [email or web address].    Your next appointment:   Your physician wants you to follow-up in: 9 MONTHS with Dr. Caryl Comes. You will receive a reminder letter in the mail two months in advance. If you don't receive a letter, please call our office to schedule the follow-up appointment.  Remote monitoring is used to monitor your ICD from home. This monitoring reduces the number of office visits required to check your device to one time per year. It allows [place] to keep an eye on the functioning of your device to ensure it is working properly. You are scheduled for a device check from home on 04/09/20. You may send your transmission at any time that day. If you have a wireless device, the transmission will be sent automatically. After your physician reviews your transmission, you will receive a postcard with your next transmission date.  The format for your next appointment:   In Person with Virl Axe, MD   Thank you for choosing [facility]  !!

## 2020-01-23 ENCOUNTER — Telehealth: Payer: Self-pay | Admitting: Internal Medicine

## 2020-01-23 NOTE — Telephone Encounter (Signed)
Michele [name] is calling requesting to speak with the device clinic in regards to their previous conversation due to a follow up question. Please advise.

## 2020-01-23 NOTE — Telephone Encounter (Signed)
Patient saw A. Tillery, PA on 01/19/20. Has been following a low salt intake diet. No worsening symptoms. Advised to call with any questions or concerns.

## 2020-01-25 NOTE — Telephone Encounter (Signed)
Attempted to reach pt, no answer, LVM on VM with device clinic phone number and hours.

## 2020-02-07 NOTE — Telephone Encounter (Signed)
Following up. Spoke to [name] [facility]), states the question she can no longer think of. Advised to call with further questions or concerns.

## 2020-02-21 ENCOUNTER — Ambulatory Visit (INDEPENDENT_AMBULATORY_CARE_PROVIDER_SITE_OTHER): Payer: Medicare Other

## 2020-02-21 DIAGNOSIS — I4891 Unspecified atrial fibrillation: Secondary | ICD-10-CM

## 2020-02-21 LAB — CUP PACEART REMOTE DEVICE CHECK
Battery Remaining Longevity: 7 mo
Battery Voltage: 2.84 V
Brady Statistic RA Percent Paced: 0 %
Brady Statistic RV Percent Paced: 99.94 %
Date Time Interrogation Session: 20220104033424
HighPow Impedance: 49 Ohm
HighPow Impedance: 83 Ohm
Implantable Lead Implant Date: 20080908
Implantable Lead Implant Date: 20100121
Implantable Lead Implant Date: 20100121
Implantable Lead Implant Date: 20130704
Implantable Lead Location: 753858
Implantable Lead Location: 753859
Implantable Lead Location: 753860
Implantable Lead Location: 753860
Implantable Lead Model: 4196
Implantable Lead Model: 5076
Implantable Lead Model: 5076
Implantable Lead Model: 7120
Implantable Pulse Generator Implant Date: 20171004
Lead Channel Impedance Value: 304 Ohm
Lead Channel Impedance Value: 418 Ohm
Lead Channel Impedance Value: 456 Ohm
Lead Channel Impedance Value: 475 Ohm
Lead Channel Impedance Value: 513 Ohm
Lead Channel Impedance Value: 779 Ohm
Lead Channel Pacing Threshold Amplitude: 0.875 V
Lead Channel Pacing Threshold Amplitude: 2 V
Lead Channel Pacing Threshold Pulse Width: 0.4 ms
Lead Channel Pacing Threshold Pulse Width: 0.8 ms
Lead Channel Sensing Intrinsic Amplitude: 2.375 mV
Lead Channel Sensing Intrinsic Amplitude: 2.375 mV
Lead Channel Setting Pacing Amplitude: 2 V
Lead Channel Setting Pacing Amplitude: 3 V
Lead Channel Setting Pacing Pulse Width: 0.4 ms
Lead Channel Setting Pacing Pulse Width: 0.8 ms
Lead Channel Setting Sensing Sensitivity: 0.3 mV

## 2020-03-05 ENCOUNTER — Encounter: Payer: Self-pay | Admitting: Neurology

## 2020-03-05 ENCOUNTER — Telehealth (INDEPENDENT_AMBULATORY_CARE_PROVIDER_SITE_OTHER): Payer: Medicare Other | Admitting: Neurology

## 2020-03-05 ENCOUNTER — Other Ambulatory Visit: Payer: Self-pay

## 2020-03-05 VITALS — Wt 128.0 lb

## 2020-03-05 DIAGNOSIS — F03B18 Unspecified dementia, moderate, with other behavioral disturbance: Secondary | ICD-10-CM

## 2020-03-05 DIAGNOSIS — F0391 Unspecified dementia with behavioral disturbance: Secondary | ICD-10-CM

## 2020-03-05 MED ORDER — MEMANTINE HCL 10 MG PO TABS: 10.0000 mg | ORAL_TABLET | Freq: Two times a day (BID) | ORAL | 3 refills | Status: AC
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
## 2020-03-07 NOTE — Progress Notes (Signed)
Remote ICD transmission.   

## 2020-03-26 ENCOUNTER — Ambulatory Visit (INDEPENDENT_AMBULATORY_CARE_PROVIDER_SITE_OTHER): Payer: Medicare Other

## 2020-03-26 DIAGNOSIS — I4891 Unspecified atrial fibrillation: Secondary | ICD-10-CM

## 2020-03-28 LAB — CUP PACEART REMOTE DEVICE CHECK
Battery Remaining Longevity: 6 mo
Battery Voltage: 2.83 V
Brady Statistic RA Percent Paced: 0 %
Brady Statistic RV Percent Paced: 99.94 %
Date Time Interrogation Session: 20220207001708
HighPow Impedance: 48 Ohm
HighPow Impedance: 93 Ohm
Implantable Lead Implant Date: 20080908
Implantable Lead Implant Date: 20100121
Implantable Lead Implant Date: 20100121
Implantable Lead Implant Date: 20130704
Implantable Lead Location: 753858
Implantable Lead Location: 753859
Implantable Lead Location: 753860
Implantable Lead Location: 753860
Implantable Lead Model: 4196
Implantable Lead Model: 5076
Implantable Lead Model: 5076
Implantable Lead Model: 7120
Implantable Pulse Generator Implant Date: 20171004
Lead Channel Impedance Value: 304 Ohm
Lead Channel Impedance Value: 418 Ohm
Lead Channel Impedance Value: 475 Ohm
Lead Channel Impedance Value: 475 Ohm
Lead Channel Impedance Value: 532 Ohm
Lead Channel Impedance Value: 893 Ohm
Lead Channel Pacing Threshold Amplitude: 0.875 V
Lead Channel Pacing Threshold Amplitude: 1.625 V
Lead Channel Pacing Threshold Pulse Width: 0.4 ms
Lead Channel Pacing Threshold Pulse Width: 0.8 ms
Lead Channel Sensing Intrinsic Amplitude: 3.125 mV
Lead Channel Sensing Intrinsic Amplitude: 3.125 mV
Lead Channel Setting Pacing Amplitude: 2 V
Lead Channel Setting Pacing Amplitude: 2.75 V
Lead Channel Setting Pacing Pulse Width: 0.4 ms
Lead Channel Setting Pacing Pulse Width: 0.8 ms
Lead Channel Setting Sensing Sensitivity: 0.3 mV

## 2020-03-29 ENCOUNTER — Telehealth: Payer: Self-pay | Admitting: Emergency Medicine

## 2020-03-29 NOTE — Telephone Encounter (Signed)
Patient scheduled for monthly battery checks. Estimated ERI 6 months from last remote on 03/26/2020.

## 2020-04-02 NOTE — Progress Notes (Signed)
Remote ICD transmission.   

## 2020-04-30 ENCOUNTER — Ambulatory Visit (INDEPENDENT_AMBULATORY_CARE_PROVIDER_SITE_OTHER): Payer: Medicare Other

## 2020-04-30 DIAGNOSIS — I428 Other cardiomyopathies: Secondary | ICD-10-CM

## 2020-05-02 LAB — CUP PACEART REMOTE DEVICE CHECK
Battery Remaining Longevity: 5 mo
Battery Voltage: 2.81 V
Brady Statistic RA Percent Paced: 0 %
Brady Statistic RV Percent Paced: 99.95 %
Date Time Interrogation Session: 20220314012403
HighPow Impedance: 47 Ohm
HighPow Impedance: 82 Ohm
Implantable Lead Implant Date: 20080908
Implantable Lead Implant Date: 20100121
Implantable Lead Implant Date: 20100121
Implantable Lead Implant Date: 20130704
Implantable Lead Location: 753858
Implantable Lead Location: 753859
Implantable Lead Location: 753860
Implantable Lead Location: 753860
Implantable Lead Model: 4196
Implantable Lead Model: 5076
Implantable Lead Model: 5076
Implantable Lead Model: 7120
Implantable Pulse Generator Implant Date: 20171004
Lead Channel Impedance Value: 304 Ohm
Lead Channel Impedance Value: 418 Ohm
Lead Channel Impedance Value: 456 Ohm
Lead Channel Impedance Value: 475 Ohm
Lead Channel Impedance Value: 475 Ohm
Lead Channel Impedance Value: 836 Ohm
Lead Channel Pacing Threshold Amplitude: 0.875 V
Lead Channel Pacing Threshold Amplitude: 1.375 V
Lead Channel Pacing Threshold Pulse Width: 0.4 ms
Lead Channel Pacing Threshold Pulse Width: 0.8 ms
Lead Channel Sensing Intrinsic Amplitude: 1.75 mV
Lead Channel Sensing Intrinsic Amplitude: 1.75 mV
Lead Channel Setting Pacing Amplitude: 2 V
Lead Channel Setting Pacing Amplitude: 2.5 V
Lead Channel Setting Pacing Pulse Width: 0.4 ms
Lead Channel Setting Pacing Pulse Width: 0.8 ms
Lead Channel Setting Sensing Sensitivity: 0.3 mV

## 2020-05-09 NOTE — Progress Notes (Signed)
Remote ICD transmission.   

## 2020-05-09 NOTE — Addendum Note (Signed)
Addended by: Douglass Rivers D on: 05/09/2020 09:06 AM   Modules accepted: Level of Service

## 2020-06-04 ENCOUNTER — Ambulatory Visit (INDEPENDENT_AMBULATORY_CARE_PROVIDER_SITE_OTHER): Payer: Medicare Other

## 2020-06-04 DIAGNOSIS — I429 Cardiomyopathy, unspecified: Secondary | ICD-10-CM

## 2020-06-06 LAB — CUP PACEART REMOTE DEVICE CHECK
Battery Remaining Longevity: 4 mo
Battery Voltage: 2.81 V
Brady Statistic RA Percent Paced: 0 %
Brady Statistic RV Percent Paced: 99.3 %
Date Time Interrogation Session: 20220418073725
HighPow Impedance: 50 Ohm
HighPow Impedance: 84 Ohm
Implantable Lead Implant Date: 20080908
Implantable Lead Implant Date: 20100121
Implantable Lead Implant Date: 20100121
Implantable Lead Implant Date: 20130704
Implantable Lead Location: 753858
Implantable Lead Location: 753859
Implantable Lead Location: 753860
Implantable Lead Location: 753860
Implantable Lead Model: 4196
Implantable Lead Model: 5076
Implantable Lead Model: 5076
Implantable Lead Model: 7120
Implantable Pulse Generator Implant Date: 20171004
Lead Channel Impedance Value: 304 Ohm
Lead Channel Impedance Value: 418 Ohm
Lead Channel Impedance Value: 456 Ohm
Lead Channel Impedance Value: 532 Ohm
Lead Channel Impedance Value: 551 Ohm
Lead Channel Impedance Value: 988 Ohm
Lead Channel Pacing Threshold Amplitude: 0.875 V
Lead Channel Pacing Threshold Amplitude: 1.375 V
Lead Channel Pacing Threshold Pulse Width: 0.4 ms
Lead Channel Pacing Threshold Pulse Width: 0.8 ms
Lead Channel Sensing Intrinsic Amplitude: 1.75 mV
Lead Channel Sensing Intrinsic Amplitude: 1.75 mV
Lead Channel Setting Pacing Amplitude: 2 V
Lead Channel Setting Pacing Amplitude: 2.5 V
Lead Channel Setting Pacing Pulse Width: 0.4 ms
Lead Channel Setting Pacing Pulse Width: 0.8 ms
Lead Channel Setting Sensing Sensitivity: 0.3 mV

## 2020-06-20 ENCOUNTER — Telehealth: Payer: Self-pay | Admitting: Internal Medicine

## 2020-06-20 NOTE — Telephone Encounter (Signed)
FYI--Patient's daughter called to find her when her next remote device check is. She also wanted to make Dr. Caryl Comes aware that the patient had a stroke last week. Call was disconnected shortly after she gave me this information, but if additional questions, a call may be returned to patient's daughter at [phone].

## 2020-06-20 NOTE — Telephone Encounter (Signed)
Spoke with pt's daughter, DPR who reports pt had a recent stroke and will going to rehab.  Advised of pt's next remote check scheduled for 07/05/2020 at 725am.  Pt's daughter states she will take monitor to the rehab facility.  She states pt has not been on Xarelto d/t her fall risk and is being considered for watchman.  Pt's daughter will follow up once pt is discharged from rehab.

## 2020-06-21 NOTE — Addendum Note (Signed)
Addended by: Cheri Kearns A on: 06/21/2020 10:52 AM   Modules accepted: Level of Service

## 2020-06-21 NOTE — Progress Notes (Signed)
Remote ICD transmission.   

## 2020-07-05 LAB — CUP PACEART REMOTE DEVICE CHECK
Battery Remaining Longevity: 4 mo
Battery Voltage: 2.78 V
Brady Statistic RA Percent Paced: 0 %
Brady Statistic RV Percent Paced: 99.1 %
Date Time Interrogation Session: 20220519043825
HighPow Impedance: 54 Ohm
HighPow Impedance: 94 Ohm
Implantable Lead Implant Date: 20080908
Implantable Lead Implant Date: 20100121
Implantable Lead Implant Date: 20100121
Implantable Lead Implant Date: 20130704
Implantable Lead Location: 753858
Implantable Lead Location: 753859
Implantable Lead Location: 753860
Implantable Lead Location: 753860
Implantable Lead Model: 4196
Implantable Lead Model: 5076
Implantable Lead Model: 5076
Implantable Lead Model: 7120
Implantable Pulse Generator Implant Date: 20171004
Lead Channel Impedance Value: 304 Ohm
Lead Channel Impedance Value: 456 Ohm
Lead Channel Impedance Value: 513 Ohm
Lead Channel Impedance Value: 532 Ohm
Lead Channel Impedance Value: 551 Ohm
Lead Channel Impedance Value: 988 Ohm
Lead Channel Pacing Threshold Amplitude: 0.875 V
Lead Channel Pacing Threshold Amplitude: 1.625 V
Lead Channel Pacing Threshold Pulse Width: 0.4 ms
Lead Channel Pacing Threshold Pulse Width: 0.8 ms
Lead Channel Sensing Intrinsic Amplitude: 2.75 mV
Lead Channel Sensing Intrinsic Amplitude: 2.75 mV
Lead Channel Setting Pacing Amplitude: 2 V
Lead Channel Setting Pacing Amplitude: 2.75 V
Lead Channel Setting Pacing Pulse Width: 0.4 ms
Lead Channel Setting Pacing Pulse Width: 0.8 ms
Lead Channel Setting Sensing Sensitivity: 0.3 mV

## 2020-07-27 ENCOUNTER — Encounter: Payer: Self-pay | Admitting: Internal Medicine

## 2020-07-27 ENCOUNTER — Telehealth: Payer: Self-pay | Admitting: *Deleted

## 2020-07-27 NOTE — Telephone Encounter (Signed)
error 

## 2020-07-27 NOTE — Telephone Encounter (Signed)
Spoke with pt's daughter pt is  due in the near future for battery change on device Per daughter pt has had recent stroke and is currently off of Xarelto due to brain bleed Pt currently in rehab and is ambulatory but has severe dementia Pt's daughter is in process of getting advanced directives done on pt and was wanting input from Dr Caryl Comes when it would or would not be appropriate to proceed with change out Will forward to Dr Caryl Comes for review and recommendations ./cy

## 2020-07-30 ENCOUNTER — Telehealth: Payer: Self-pay | Admitting: Internal Medicine

## 2020-07-30 NOTE — Telephone Encounter (Signed)
Called and spoke with [employer] who thought they may be able to assist, in conjunction with HOSPICE, to inacitvate high voltage therapy.  I have also called HOSPICE [phone] to speak with Dr Natividad Brood (sp?), her hospice MD, about the order to inactivate high voltage therapy  Left a message and will await a call back  Called Dtr back and outlined the above

## 2020-07-30 NOTE — Telephone Encounter (Signed)
Spoke with pt [name] (ok per DPR).  Daughter wants Dr. Caryl Comes to know that pt is now in hospice care d/t alzheimer/dementia.  Daughter wants to know if pt is pacemaker dependent and if battery runs out will her heart stop.  She also has concerns that pt had a stroke in April wants to know if she would be Daughter would like to speak with Dr. Caryl Comes and or nurse to assist with coming up with a plan of care for pt.  I informed her that MD and nurse are not in office today.  Will route to MD and Nurse to follow up.

## 2020-07-30 NOTE — Telephone Encounter (Signed)
Patient's daughter is requesting to speak with Dr. Olin Pia nurse. She states the patient recently began hospice and she would like to discuss removing her defibrillator.

## 2020-07-30 NOTE — Telephone Encounter (Signed)
Called and spoke with Dtr Michele [name], one of 5 sibs regarding device generator replacement in the setting of progressive dementia, interval stroke Rx with tPA and complicated by bleed.  She is now on Hospice.  We discussed 1) that it is appropriate to inactivate high voltage therapy-- we will try and arrange, but may not be possible in [place] where she is now living with her daughter 2) replacement of her pacing function given that she is pacer dependent following AV nodal ablation.  I have told her that it is ethically reasonable to choose not to proceed with a replacement procedure -- that she had, from 5/22, 4 months to RRT and then 3 months thereafter and then she may or may not have an escape rhythm.  It is also ethically reasonable to replace the low voltage function-- longevity might also be extendable by decreasing outputs with a lower safety margin  There is to be a family gathering in the next few weeks where they will try and decide how they would like to proceed  We will schedule telehealth visit in about 3 months to revisit the topic

## 2020-07-30 NOTE — Telephone Encounter (Signed)
Long separate note re calls back and forth with family, [employer]tr[employer] and hospice today

## 2020-08-06 ENCOUNTER — Ambulatory Visit (INDEPENDENT_AMBULATORY_CARE_PROVIDER_SITE_OTHER): Payer: Medicare Other

## 2020-08-06 DIAGNOSIS — I428 Other cardiomyopathies: Secondary | ICD-10-CM

## 2020-08-06 LAB — CUP PACEART REMOTE DEVICE CHECK
Battery Remaining Longevity: 2 mo
Battery Voltage: 2.75 V
Brady Statistic RA Percent Paced: 0 %
Brady Statistic RV Percent Paced: 99.23 %
Date Time Interrogation Session: 20220620072304
HighPow Impedance: 51 Ohm
HighPow Impedance: 91 Ohm
Implantable Lead Implant Date: 20080908
Implantable Lead Implant Date: 20100121
Implantable Lead Implant Date: 20100121
Implantable Lead Implant Date: 20130704
Implantable Lead Location: 753858
Implantable Lead Location: 753859
Implantable Lead Location: 753860
Implantable Lead Location: 753860
Implantable Lead Model: 4196
Implantable Lead Model: 5076
Implantable Lead Model: 5076
Implantable Lead Model: 7120
Implantable Pulse Generator Implant Date: 20171004
Lead Channel Impedance Value: 304 Ohm
Lead Channel Impedance Value: 456 Ohm
Lead Channel Impedance Value: 456 Ohm
Lead Channel Impedance Value: 532 Ohm
Lead Channel Impedance Value: 589 Ohm
Lead Channel Impedance Value: 988 Ohm
Lead Channel Pacing Threshold Amplitude: 0.75 V
Lead Channel Pacing Threshold Amplitude: 1.625 V
Lead Channel Pacing Threshold Pulse Width: 0.4 ms
Lead Channel Pacing Threshold Pulse Width: 0.8 ms
Lead Channel Sensing Intrinsic Amplitude: 2.75 mV
Lead Channel Sensing Intrinsic Amplitude: 2.75 mV
Lead Channel Setting Pacing Amplitude: 2 V
Lead Channel Setting Pacing Amplitude: 2.75 V
Lead Channel Setting Pacing Pulse Width: 0.4 ms
Lead Channel Setting Pacing Pulse Width: 0.8 ms
Lead Channel Setting Sensing Sensitivity: 0.3 mV

## 2020-08-27 NOTE — Progress Notes (Signed)
Remote ICD transmission.   

## 2020-09-06 ENCOUNTER — Ambulatory Visit (INDEPENDENT_AMBULATORY_CARE_PROVIDER_SITE_OTHER)

## 2020-09-06 DIAGNOSIS — I428 Other cardiomyopathies: Secondary | ICD-10-CM

## 2020-09-10 LAB — CUP PACEART REMOTE DEVICE CHECK
Battery Remaining Longevity: 2 mo
Battery Voltage: 2.73 V
Brady Statistic RA Percent Paced: 0 %
Brady Statistic RV Percent Paced: 99.34 %
Date Time Interrogation Session: 20220721164227
HighPow Impedance: 54 Ohm
HighPow Impedance: 94 Ohm
Implantable Lead Implant Date: 20080908
Implantable Lead Implant Date: 20100121
Implantable Lead Implant Date: 20100121
Implantable Lead Implant Date: 20130704
Implantable Lead Location: 753858
Implantable Lead Location: 753859
Implantable Lead Location: 753860
Implantable Lead Location: 753860
Implantable Lead Model: 4196
Implantable Lead Model: 5076
Implantable Lead Model: 5076
Implantable Lead Model: 7120
Implantable Pulse Generator Implant Date: 20171004
Lead Channel Impedance Value: 342 Ohm
Lead Channel Impedance Value: 475 Ohm
Lead Channel Impedance Value: 532 Ohm
Lead Channel Impedance Value: 532 Ohm
Lead Channel Impedance Value: 551 Ohm
Lead Channel Impedance Value: 950 Ohm
Lead Channel Pacing Threshold Amplitude: 0.875 V
Lead Channel Pacing Threshold Amplitude: 2 V
Lead Channel Pacing Threshold Pulse Width: 0.4 ms
Lead Channel Pacing Threshold Pulse Width: 0.8 ms
Lead Channel Sensing Intrinsic Amplitude: 2 mV
Lead Channel Sensing Intrinsic Amplitude: 2 mV
Lead Channel Setting Pacing Amplitude: 2 V
Lead Channel Setting Pacing Amplitude: 3 V
Lead Channel Setting Pacing Pulse Width: 0.4 ms
Lead Channel Setting Pacing Pulse Width: 0.8 ms
Lead Channel Setting Sensing Sensitivity: 0.3 mV

## 2020-09-17 ENCOUNTER — Telehealth: Payer: Self-pay

## 2020-09-17 NOTE — Telephone Encounter (Signed)
Spoke with pt's [name], [place] and advised per Dr Caryl Comes device approaching ERI.  Pt' daughter reports pt is currently receiving Hospice services.  She states it is still her and the family's desire not to pursue device replacement and agrees to terminate monitoring.  Pt is scheduled for tele-health appointment 10/23/2020.  Pt's daughter requesting that appointment be canceled as well. Will forward information to Dr Caryl Comes and device RN to make aware of family's desires.

## 2020-09-17 NOTE — Telephone Encounter (Signed)
I spoke with the patient daughter [name] [facility]), I told per Dr. Caryl Comes note the patient is approaching ERI. I asked was it still the family desire not to replace the pacemaker. She states no. I told her she can unplug the monitor. I will send a return kit to the address on file. I cancelled all upcoming appointments, and marked her inactive in paceart. I took her out of [facility].

## 2020-09-17 NOTE — Telephone Encounter (Signed)
-----   Message from Deboraha Sprang, MD sent at 09/15/2020  4:29 PM EDT ----- Remote reviewed. This remote is abnormal for tachy therpaies are off The device is approaching ERI CAn we reachout to the daughter and clarify with her if it is still her desire not to replace the pacemaker and then we can terminate monitoring Thanks SK

## 2020-09-18 NOTE — Telephone Encounter (Signed)
Thx

## 2020-09-28 NOTE — Progress Notes (Signed)
Remote ICD transmission.   

## 2020-09-28 NOTE — Addendum Note (Signed)
Addended by: Cheri Kearns A on: 09/28/2020 01:57 PM   Modules accepted: Level of Service

## 2020-10-23 ENCOUNTER — Telehealth: Payer: Medicare Other | Admitting: Internal Medicine
